# Patient Record
Sex: Male | Born: 1935 | Race: White | Hispanic: No | Marital: Married | Smoking: Former smoker
Health system: Southern US, Community
[De-identification: ages and names within clinical notes are randomized; demographics above are authoritative.]

## PROBLEM LIST (undated history)

## (undated) DIAGNOSIS — I10 Essential (primary) hypertension: Secondary | ICD-10-CM

## (undated) DIAGNOSIS — I340 Nonrheumatic mitral (valve) insufficiency: Secondary | ICD-10-CM

## (undated) DIAGNOSIS — I639 Cerebral infarction, unspecified: Secondary | ICD-10-CM

## (undated) DIAGNOSIS — I4891 Unspecified atrial fibrillation: Secondary | ICD-10-CM

## (undated) DIAGNOSIS — M1990 Unspecified osteoarthritis, unspecified site: Secondary | ICD-10-CM

## (undated) DIAGNOSIS — F319 Bipolar disorder, unspecified: Secondary | ICD-10-CM

## (undated) DIAGNOSIS — N289 Disorder of kidney and ureter, unspecified: Secondary | ICD-10-CM

## (undated) DIAGNOSIS — R251 Tremor, unspecified: Secondary | ICD-10-CM

## (undated) HISTORY — PX: SHOULDER SURGERY: SHX246

---

## 2000-05-17 ENCOUNTER — Ambulatory Visit (HOSPITAL_BASED_OUTPATIENT_CLINIC_OR_DEPARTMENT_OTHER): Admission: RE | Admit: 2000-05-17 | Discharge: 2000-05-17 | Payer: Self-pay | Admitting: General Surgery

## 2000-05-17 ENCOUNTER — Encounter (INDEPENDENT_AMBULATORY_CARE_PROVIDER_SITE_OTHER): Payer: Self-pay | Admitting: Specialist

## 2002-11-07 ENCOUNTER — Ambulatory Visit (HOSPITAL_COMMUNITY): Admission: RE | Admit: 2002-11-07 | Discharge: 2002-11-07 | Payer: Self-pay | Admitting: Gastroenterology

## 2008-12-11 ENCOUNTER — Emergency Department (HOSPITAL_COMMUNITY): Admission: EM | Admit: 2008-12-11 | Discharge: 2008-12-11 | Payer: Self-pay | Admitting: Emergency Medicine

## 2009-01-20 ENCOUNTER — Emergency Department (HOSPITAL_COMMUNITY): Admission: EM | Admit: 2009-01-20 | Discharge: 2009-01-21 | Payer: Self-pay | Admitting: Emergency Medicine

## 2009-01-20 ENCOUNTER — Emergency Department (HOSPITAL_COMMUNITY): Admission: EM | Admit: 2009-01-20 | Discharge: 2009-01-20 | Payer: Self-pay | Admitting: Emergency Medicine

## 2009-05-30 ENCOUNTER — Ambulatory Visit (HOSPITAL_BASED_OUTPATIENT_CLINIC_OR_DEPARTMENT_OTHER): Admission: RE | Admit: 2009-05-30 | Discharge: 2009-05-30 | Payer: Self-pay | Admitting: Orthopedic Surgery

## 2009-08-17 IMAGING — CT CT HEAD W/O CM
1 series · 16 of 30 positions shown, 20 images · non-contrast
Comparison: None

CLINICAL DATA: Headache and fever.

CT HEAD WITHOUT CONTRAST
TECHNIQUE: Contiguous axial images were obtained from the base of
the skull through the vertex without contrast.

[Series 2: headseq 4.8 h45s · axial · 0.43mm/px · z∈[-148,+9]mm · 16 of 36 slices shown, 20 images]
[im 2/36  brain]
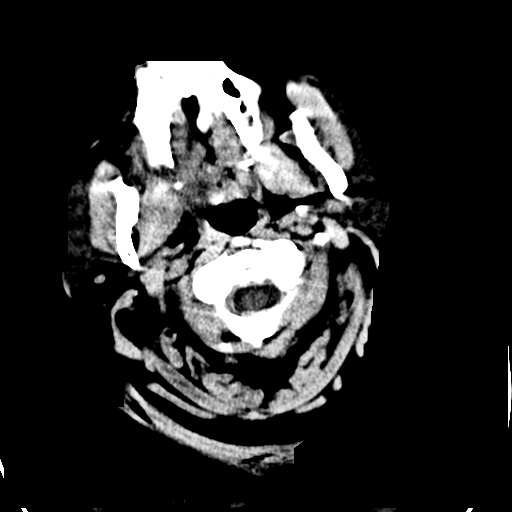
[im 2/36  bone]
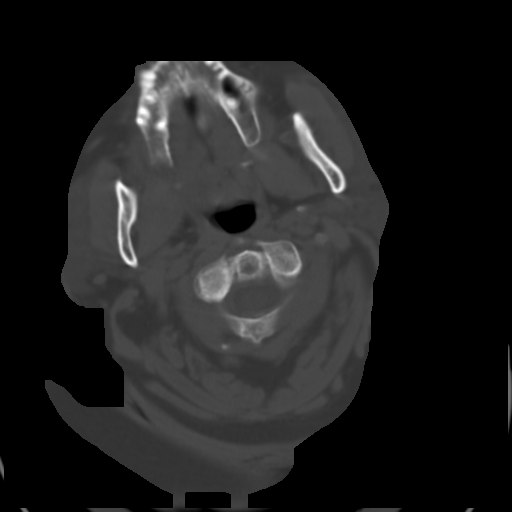
[im 4/36  brain]
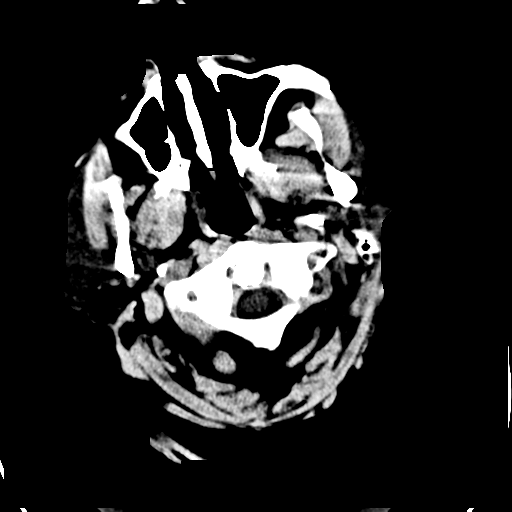
[im 7/36  brain]
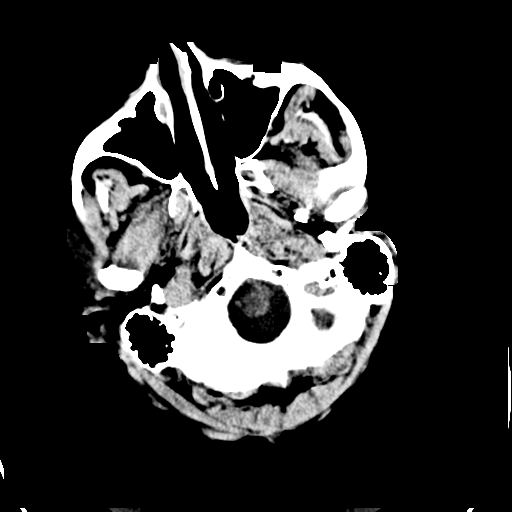
[im 9/36  brain]
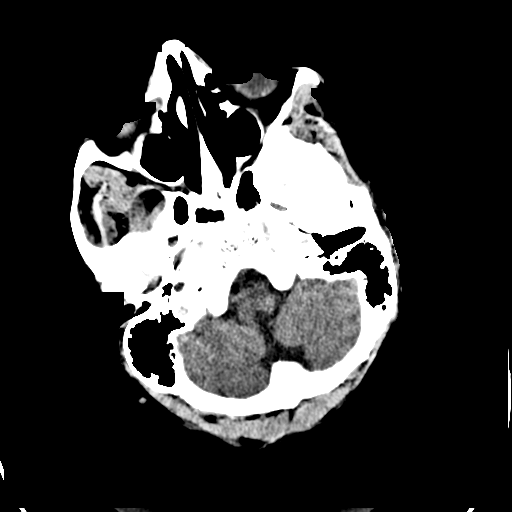
[im 10/36  brain]
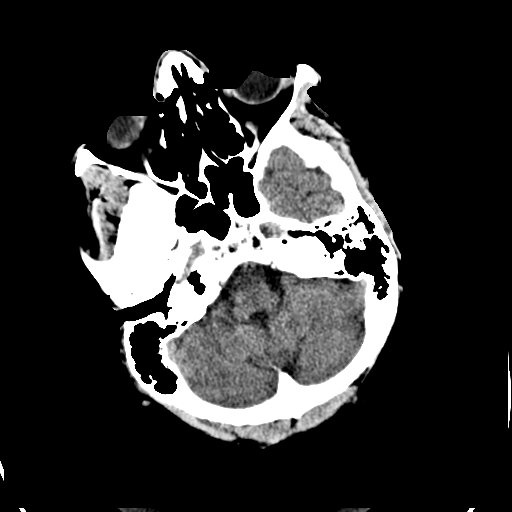
[im 10/36  bone]
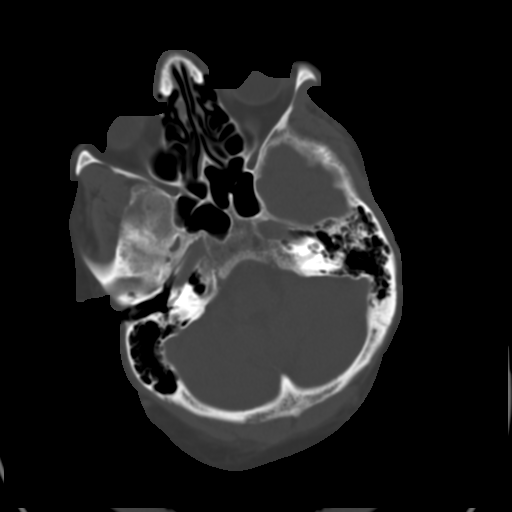
[im 13/36  brain]
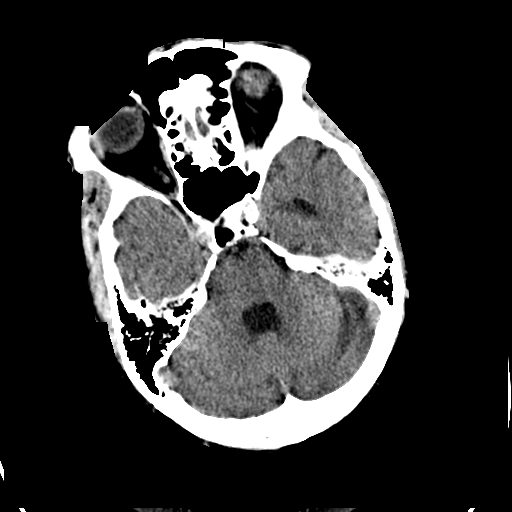
[im 15/36  brain]
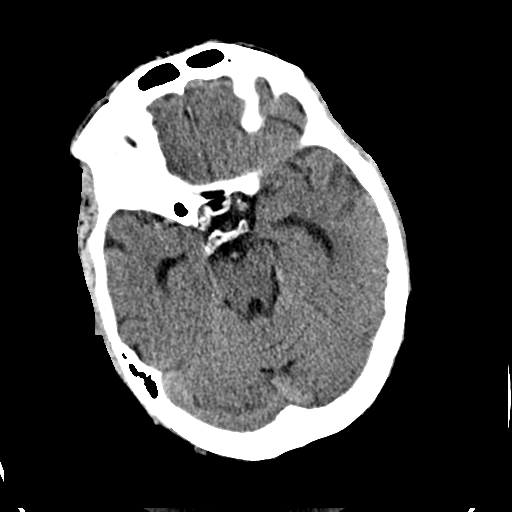
[im 17/36  brain]
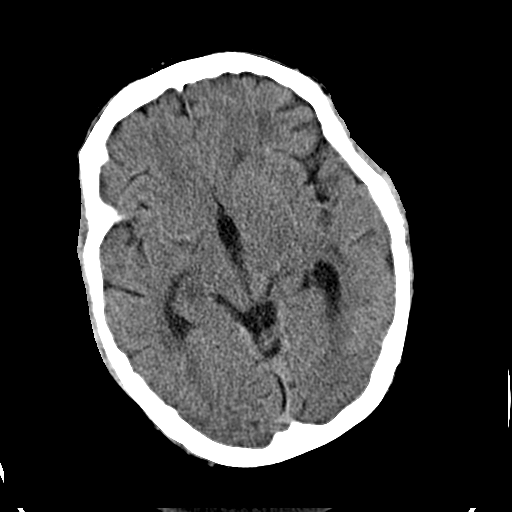
[im 19/36  brain]
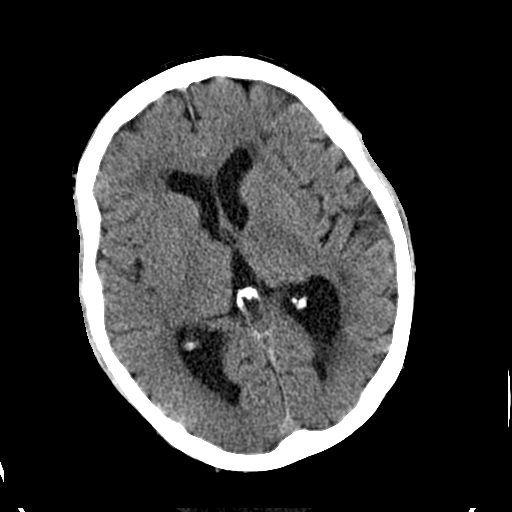
[im 19/36  bone]
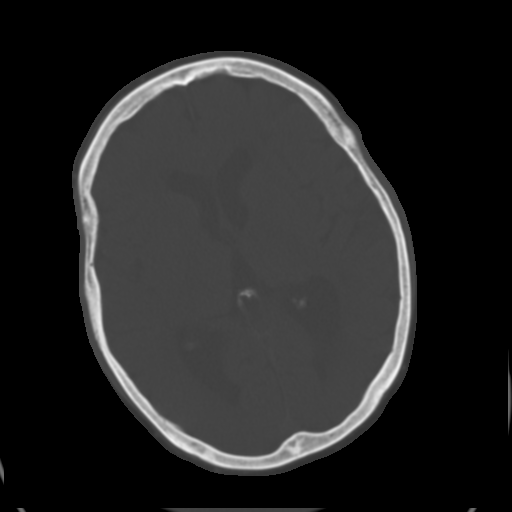
[im 21/36  brain]
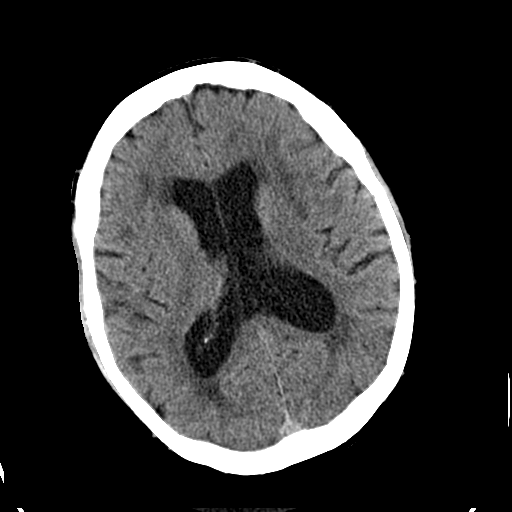
[im 23/36  brain]
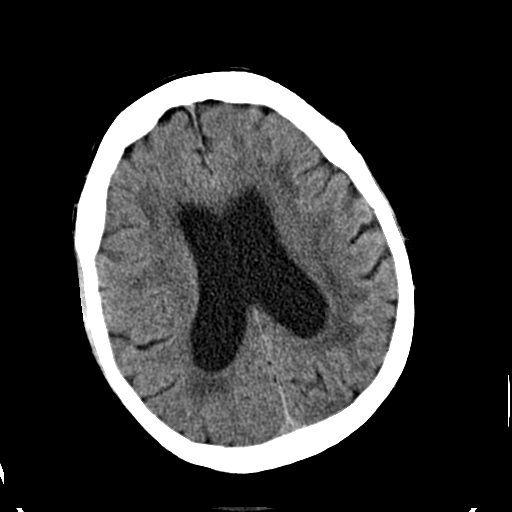
[im 26/36  brain]
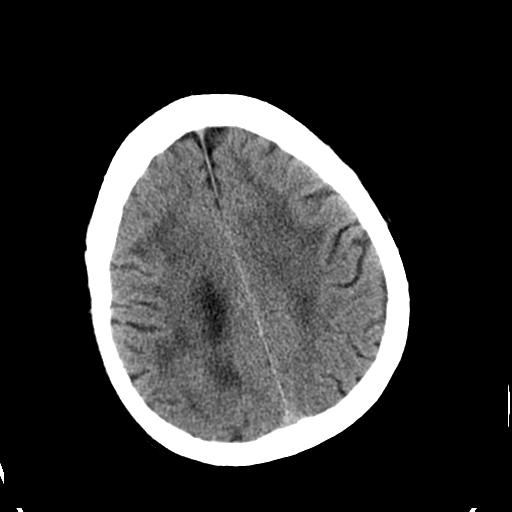
[im 27/36  brain]
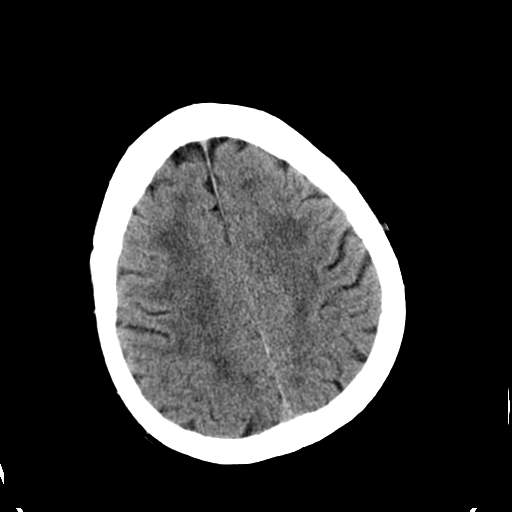
[im 27/36  bone]
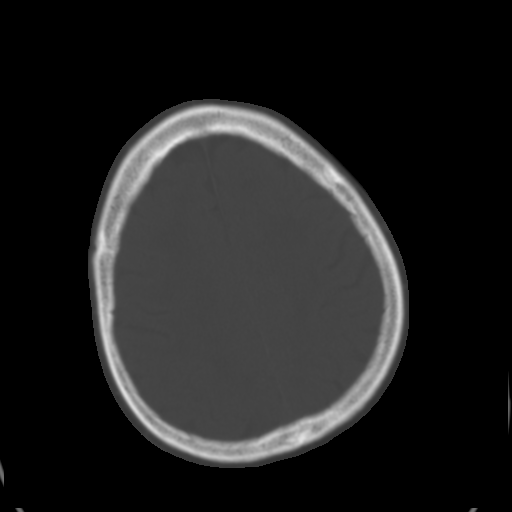
[im 29/36  brain]
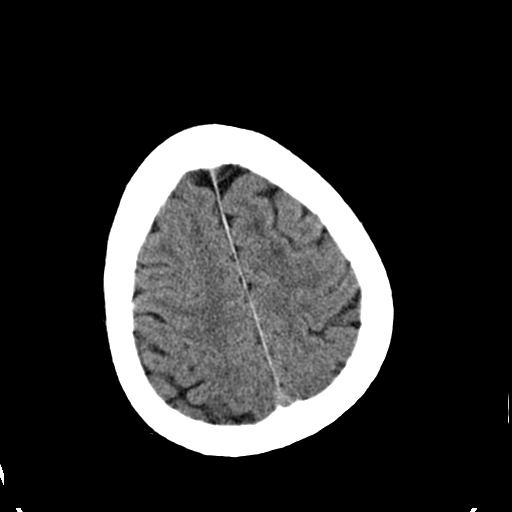
[im 32/36  brain]
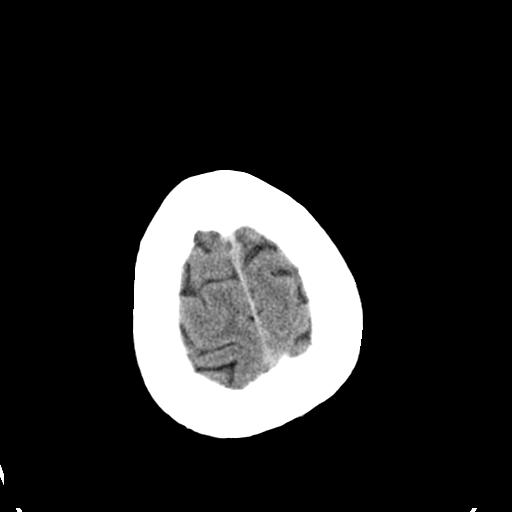
[im 34/36  brain]
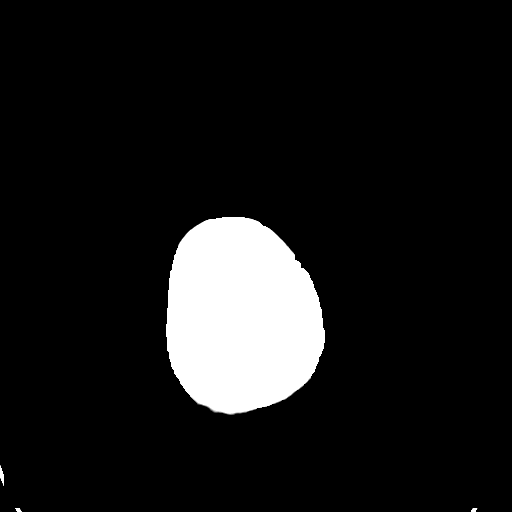

[16 of 30 positions shown; findings below may reference images not displayed]

FINDINGS: Ventricles are mildly enlarged, this may be due to
atrophy.  Patchy hypodensity throughout the white matter
bilaterally may be due to chronic microvascular ischemia.  No prior
studies for comparison.  Negative for acute infarct.  Negative for
hemorrhage or mass lesion.  The paranasal sinuses are clear.
IMPRESSION: Mild atrophy and moderate disease in the white matter bilaterally.
This is most likely due to chronic microvascular ischemia.
Negative for hemorrhage.

## 2009-08-17 IMAGING — CR DG CHEST 2V
2 series · 2 of 2 positions shown · non-contrast
Comparison: None

CLINICAL DATA: Fever.

CHEST - 2 VIEW

[w chest pa]
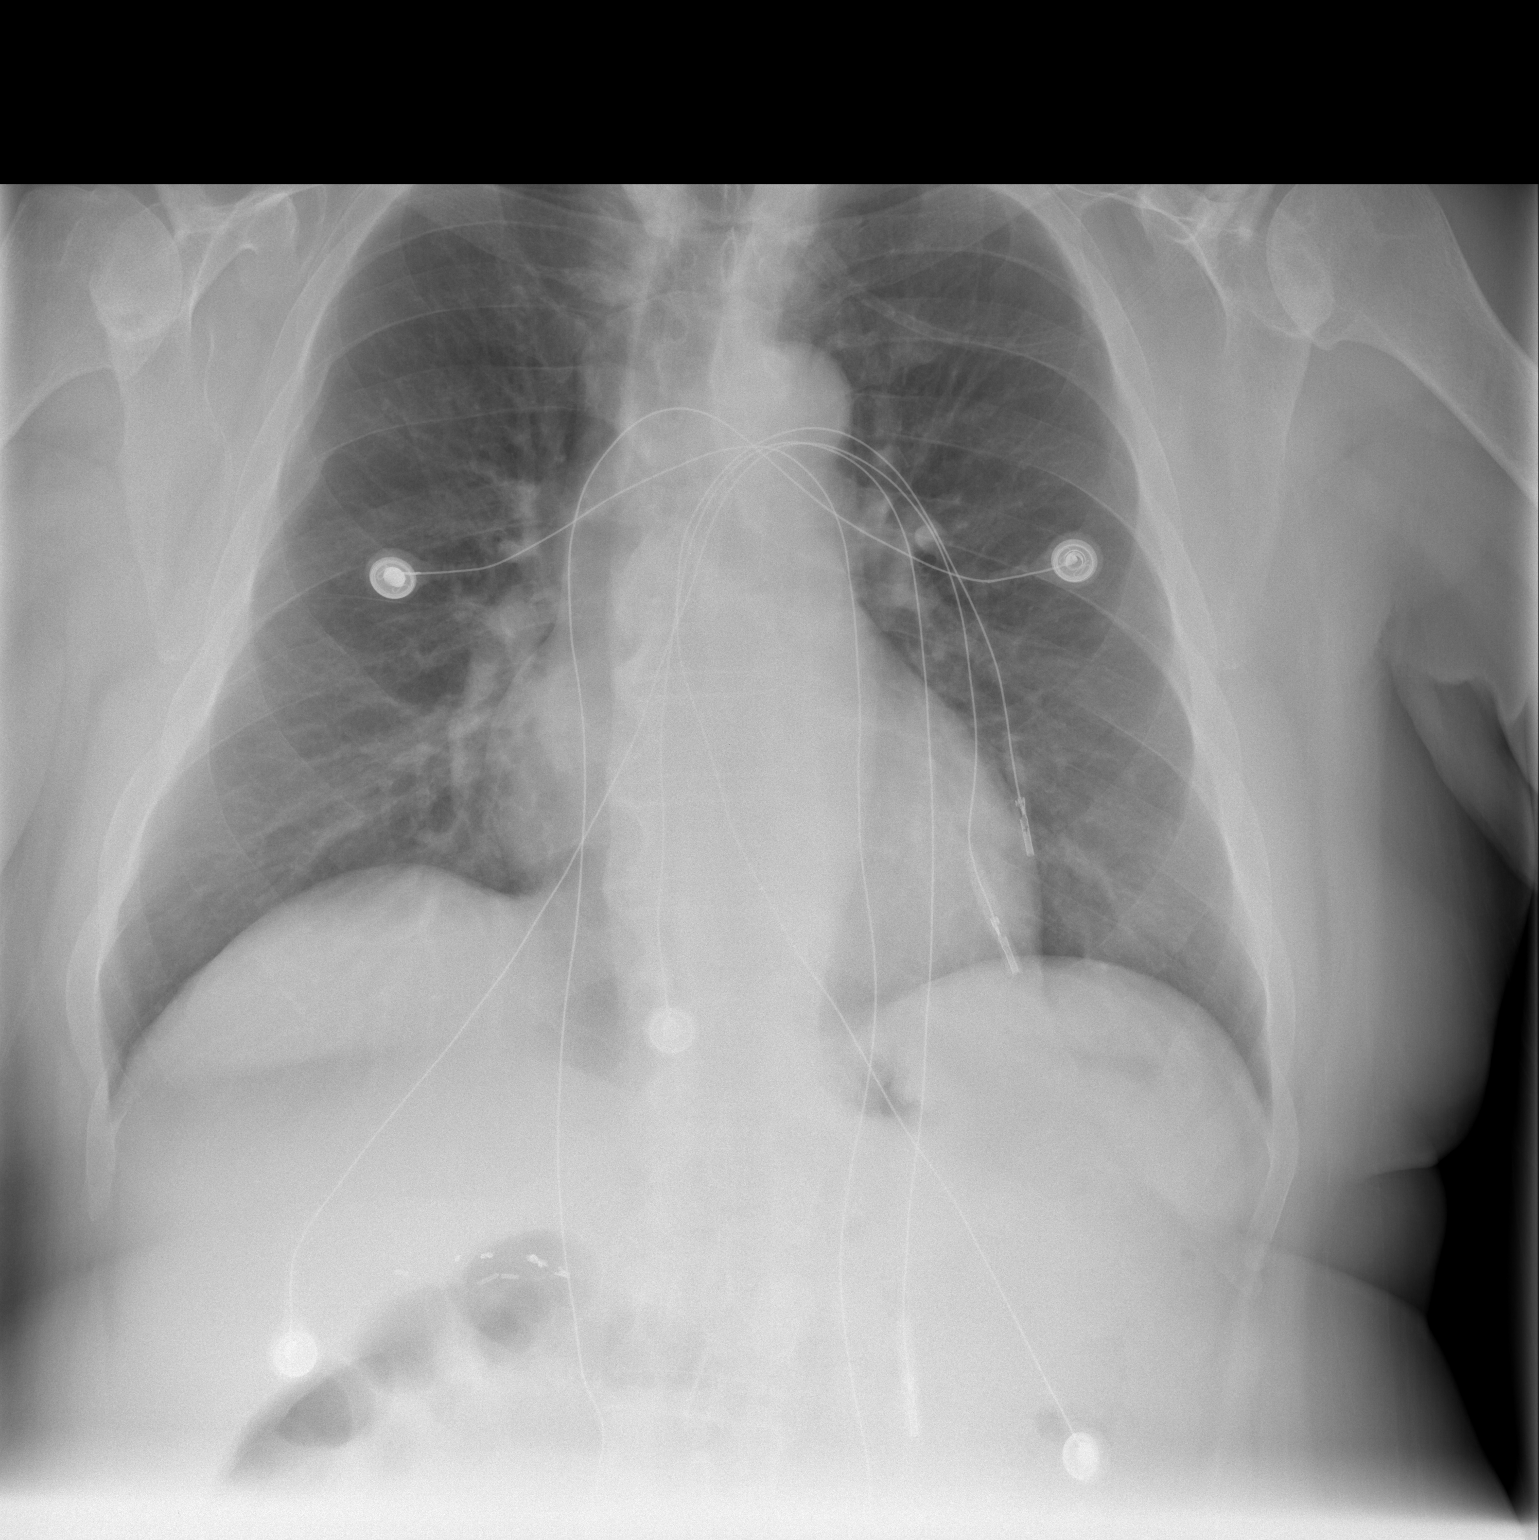

[w chest lat]
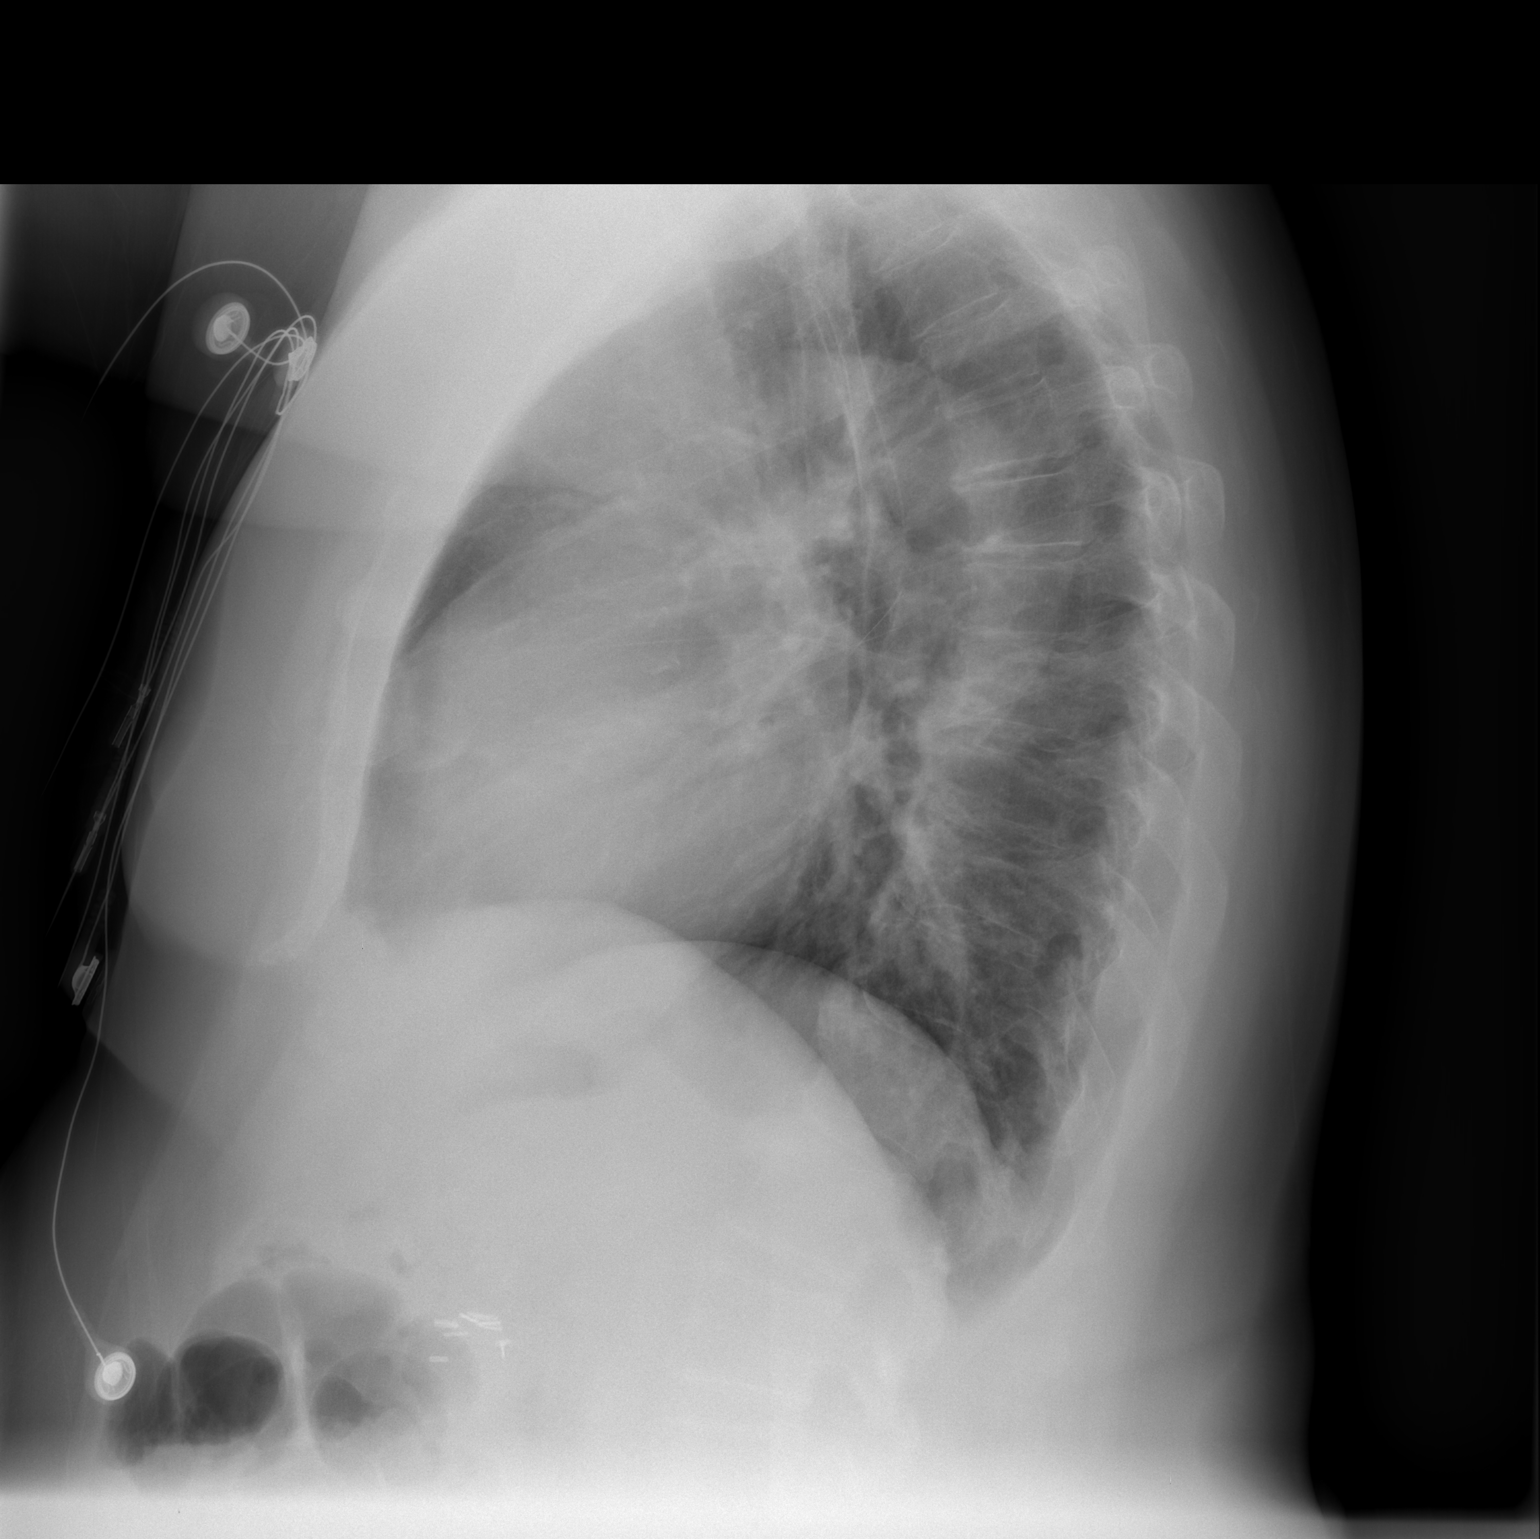

[2 of 2 positions shown; findings below may reference images not displayed]

FINDINGS: Heart size is within normal limits.  There is no heart
failure.  The lungs are clear without infiltrate or effusion.
Chronic rib fractures on the right are noted.
IMPRESSION: No acute cardiopulmonary disease.

## 2009-08-18 IMAGING — CR DG ABDOMEN 2V
2 series · 2 of 2 positions shown · non-contrast
Comparison: 12/11/2008

CLINICAL DATA: Abdominal pain, nausea, fever, headache

ABDOMEN - 2 VIEW

[w abdomen upright *]
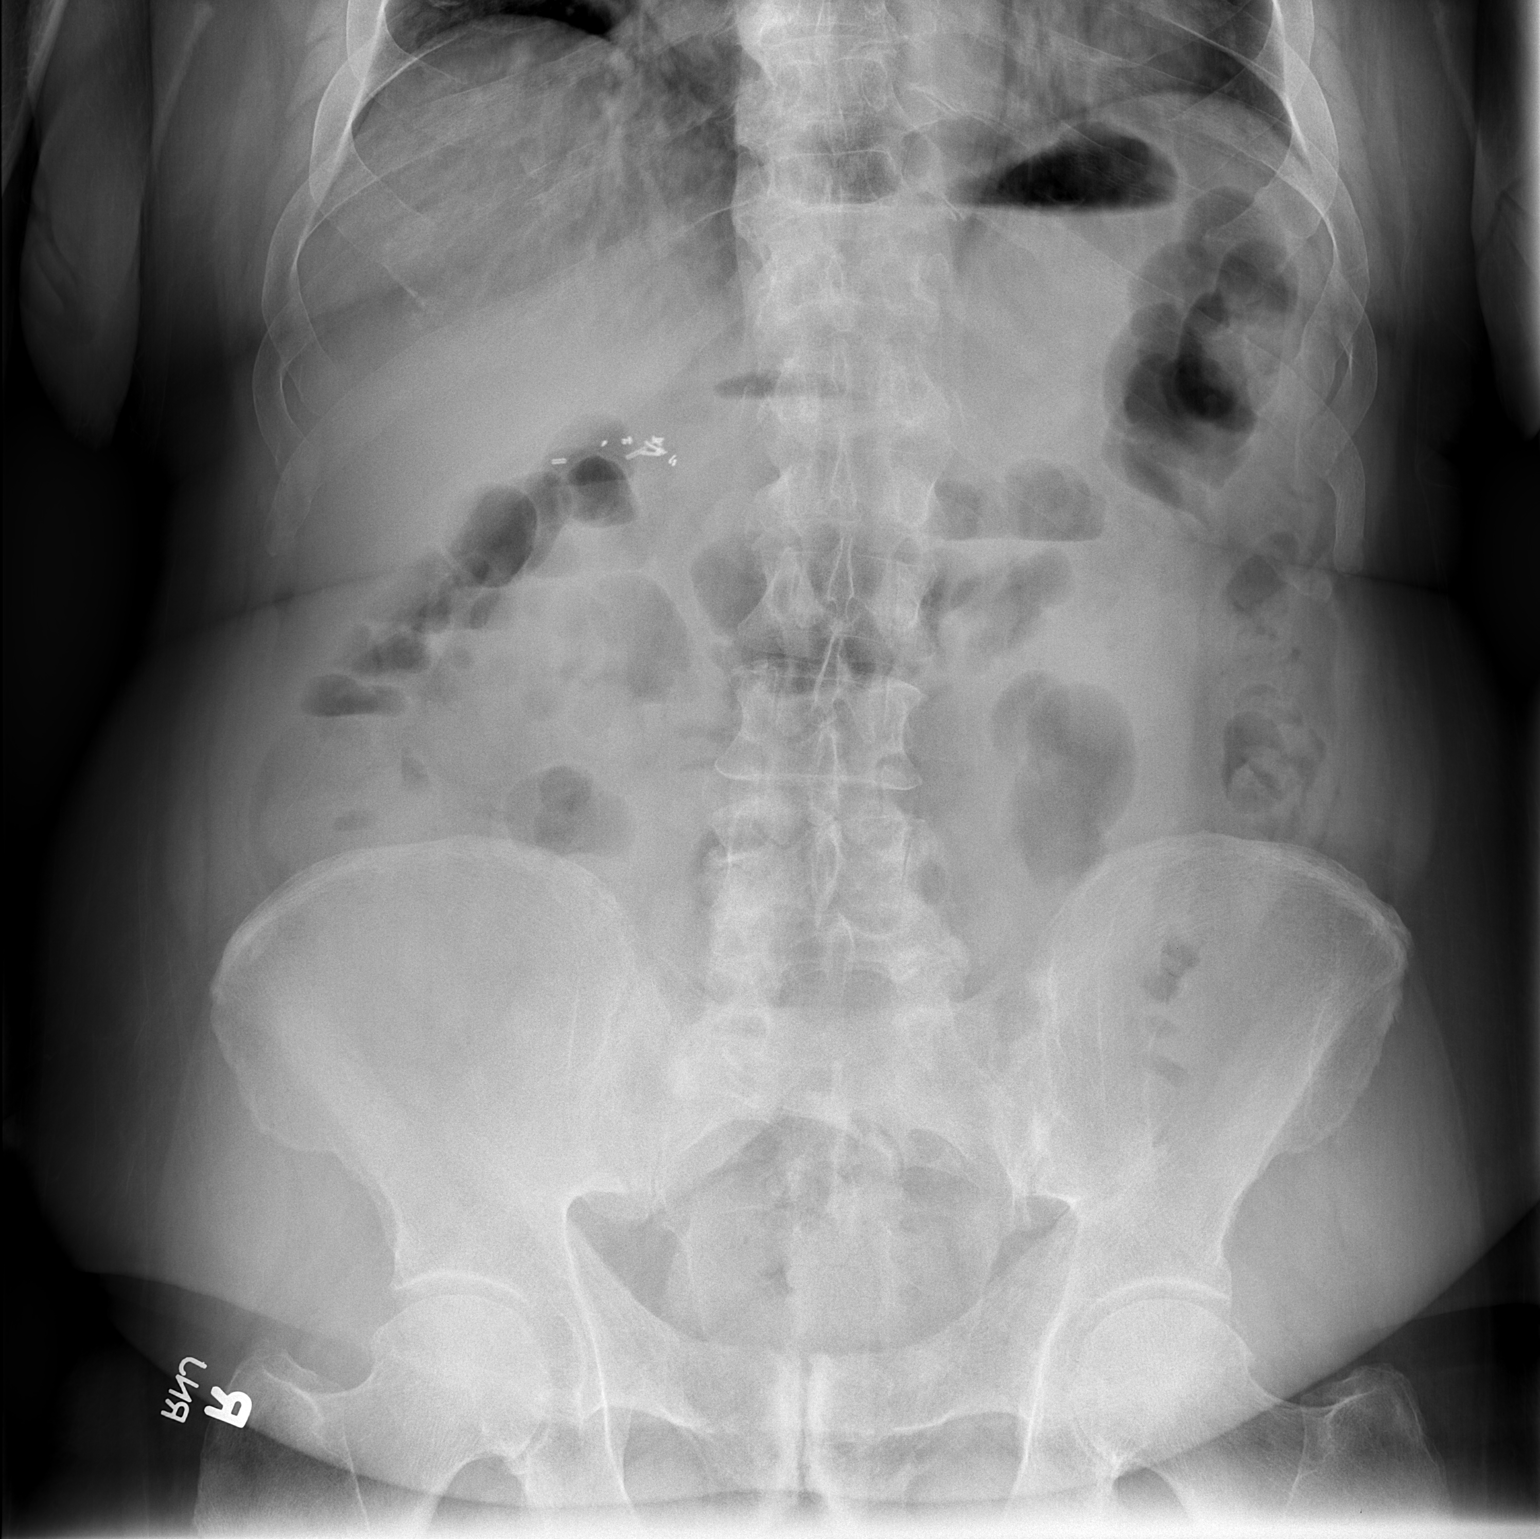

[t abdomen supine]
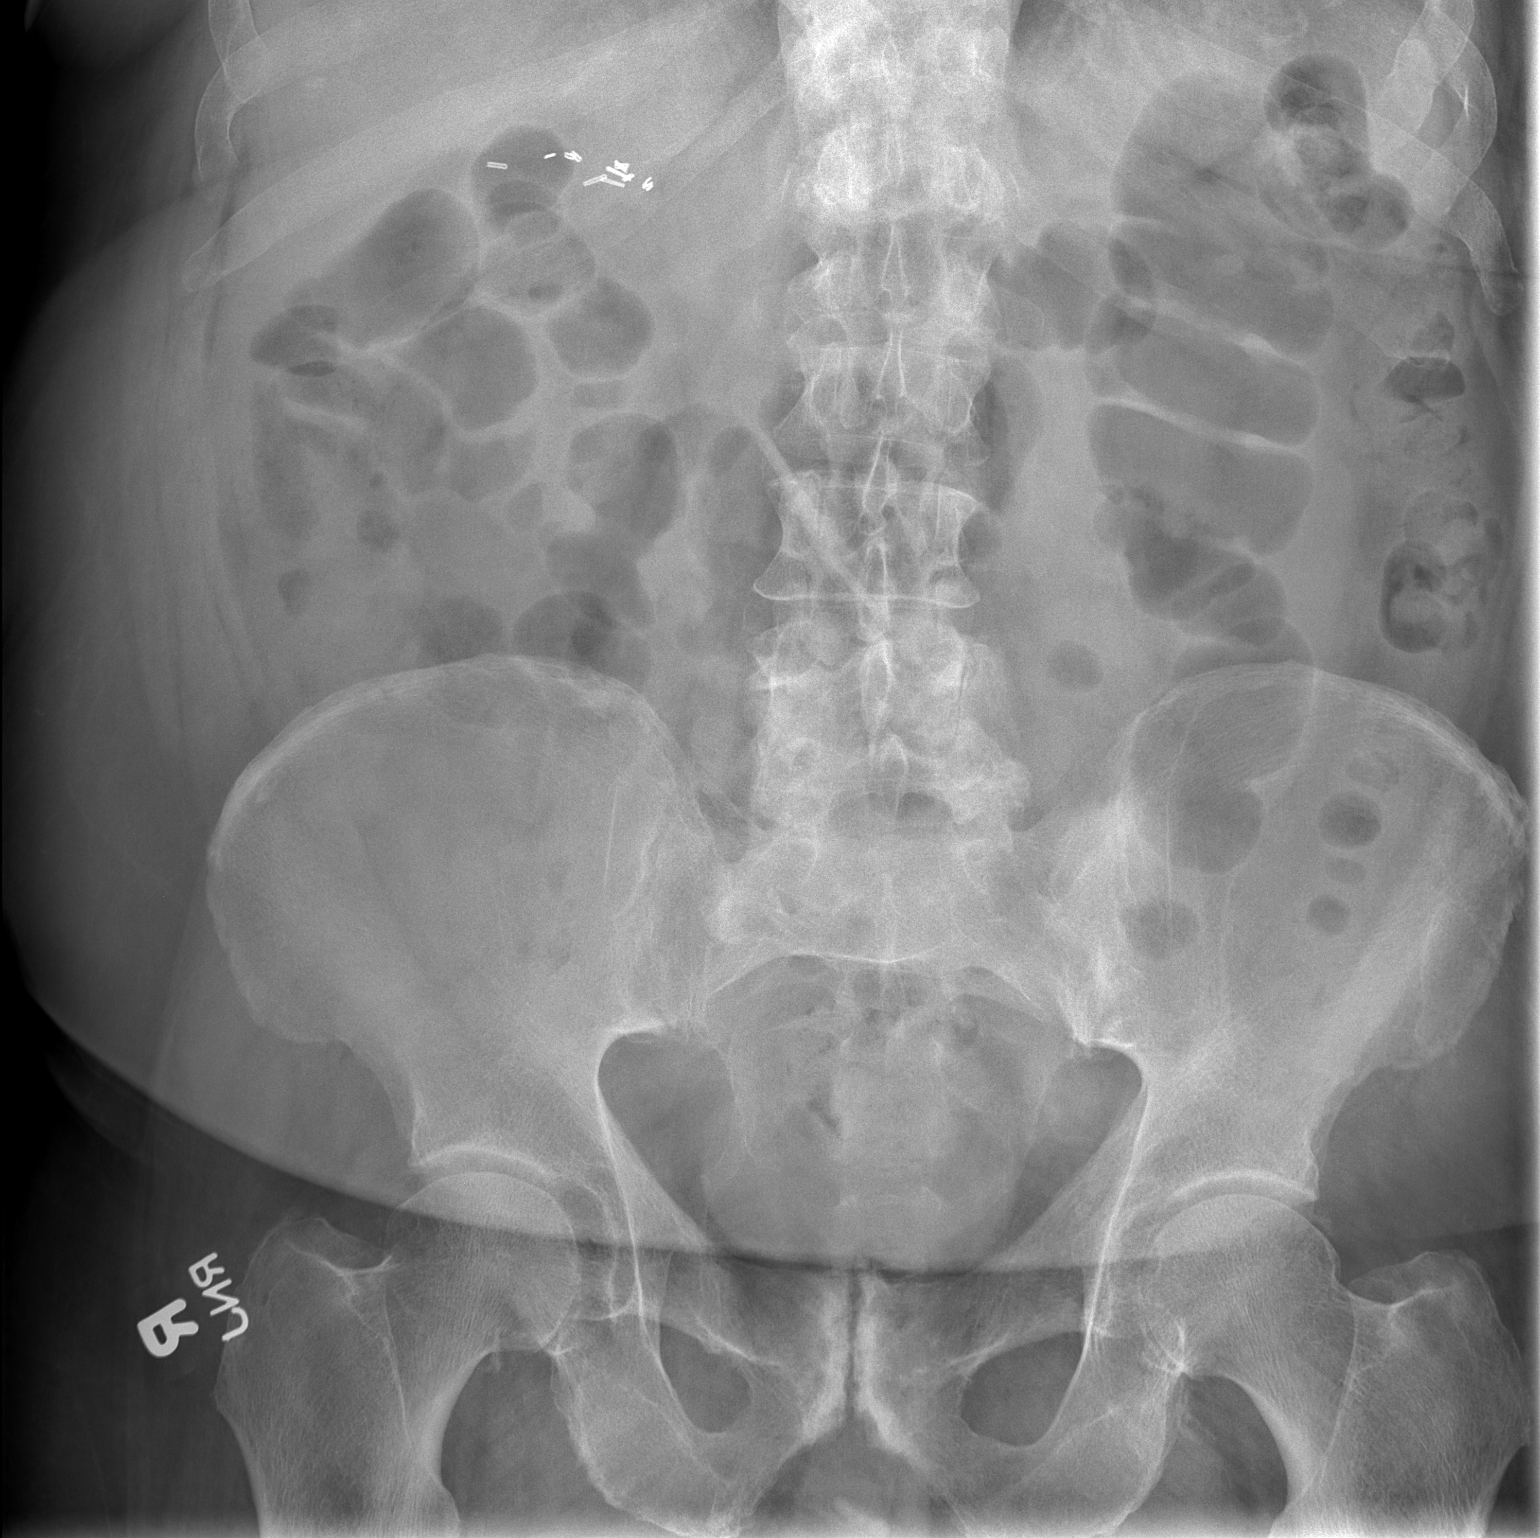

[2 of 2 positions shown; findings below may reference images not displayed]

FINDINGS: Surgical clips right upper quadrant question cholecystectomy.
Air-filled nondistended loops of large and small bowel.
No bowel dilatation, bowel wall thickening, or free air.
Osteitis pubis.
Degenerative facet disease changes lower lumbar spine.
Diffuse bony demineralization.
No urinary tract calcification.
IMPRESSION: Benign bowel gas pattern.

## 2009-08-23 ENCOUNTER — Observation Stay (HOSPITAL_COMMUNITY): Admission: EM | Admit: 2009-08-23 | Discharge: 2009-08-27 | Payer: Self-pay | Admitting: Emergency Medicine

## 2009-08-26 ENCOUNTER — Ambulatory Visit: Payer: Self-pay | Admitting: Vascular Surgery

## 2009-08-26 ENCOUNTER — Encounter (INDEPENDENT_AMBULATORY_CARE_PROVIDER_SITE_OTHER): Payer: Self-pay | Admitting: Internal Medicine

## 2009-09-07 ENCOUNTER — Encounter: Admission: RE | Admit: 2009-09-07 | Discharge: 2009-09-07 | Payer: Self-pay | Admitting: Family Medicine

## 2009-09-26 ENCOUNTER — Encounter: Admission: RE | Admit: 2009-09-26 | Discharge: 2009-09-26 | Payer: Self-pay | Admitting: Family Medicine

## 2010-09-17 LAB — BASIC METABOLIC PANEL
CO2: 23 mEq/L (ref 19–32)
Chloride: 108 mEq/L (ref 96–112)
GFR calc non Af Amer: >60 mL/min (ref >60)
Glucose, Bld: 87 mg/dL (ref 70–99)
Potassium: 5 mEq/L (ref 3.5–5.1)

## 2010-09-17 LAB — COMPREHENSIVE METABOLIC PANEL
AST: 24 U/L (ref 0–37)
CO2: 27 mEq/L (ref 19–32)
Chloride: 107 mEq/L (ref 96–112)
Potassium: 4.1 mEq/L (ref 3.5–5.1)
Total Protein: 6.8 g/dL (ref 6.0–8.3)

## 2010-09-17 LAB — CBC
HCT: 38.3 % — ABNORMAL LOW (ref 39.0–52.0)
HCT: 43.3 % (ref 39.0–52.0)
Hemoglobin: 13 g/dL (ref 13.0–17.0)
MCHC: 33.7 g/dL (ref 30.0–36.0)
MCHC: 34 g/dL (ref 30.0–36.0)
Platelets: 287 10*3/uL (ref 150–400)
Platelets: 296 10*3/uL (ref 150–400)
RBC: 4.24 MIL/uL (ref 4.22–5.81)
RDW: 12.8 % (ref 11.5–15.5)
WBC: 8.2 10*3/uL (ref 4.0–10.5)

## 2010-09-17 LAB — LITHIUM LEVEL: Lithium Lvl: 0.28 mEq/L — ABNORMAL LOW (ref 0.80–1.40)

## 2010-09-17 LAB — PROTIME-INR
Prothrombin Time: 12.8 seconds (ref 11.6–15.2)
Prothrombin Time: 13.3 seconds (ref 11.6–15.2)

## 2010-09-17 LAB — DIFFERENTIAL
Eosinophils Relative: 2 % (ref 0–5)
Lymphs Abs: 2.7 10*3/uL (ref 0.7–4.0)
Monocytes Absolute: 0.8 10*3/uL (ref 0.1–1.0)
Neutrophils Relative %: 54 % (ref 43–77)

## 2010-09-17 LAB — HEMOGLOBIN A1C
Hgb A1c MFr Bld: 5.7 % (ref 4.6–6.1)
Mean Plasma Glucose: 117 mg/dL

## 2010-09-17 LAB — CARDIAC PANEL(CRET KIN+CKTOT+MB+TROPI)
Relative Index: INVALID (ref 0.0–2.5)
Troponin I: 0.01 ng/mL (ref 0.00–0.06)

## 2010-09-17 LAB — LIPID PANEL
LDL Cholesterol: 111 mg/dL — ABNORMAL HIGH (ref 0–99)
Total CHOL/HDL Ratio: 4.8 RATIO
Triglycerides: 122 mg/dL (ref <150)
VLDL: 24 mg/dL (ref 0–40)

## 2010-09-17 LAB — APTT: aPTT: 30 seconds (ref 24–37)

## 2010-09-22 LAB — PROTIME-INR: INR: 1.05 (ref 0.00–1.49)

## 2010-09-30 LAB — POCT HEMOGLOBIN-HEMACUE: Hemoglobin: 15 g/dL (ref 13.0–17.0)

## 2010-10-01 LAB — BASIC METABOLIC PANEL
BUN: 11 mg/dL (ref 6–23)
Calcium: 9.7 mg/dL (ref 8.4–10.5)
Chloride: 106 mEq/L (ref 96–112)
Creatinine, Ser: 1.03 mg/dL (ref 0.4–1.5)
GFR calc Af Amer: >60 mL/min (ref >60)
GFR calc non Af Amer: >60 mL/min (ref >60)

## 2010-10-05 LAB — CULTURE, BLOOD (ROUTINE X 2): Culture: NO GROWTH

## 2010-10-05 LAB — DIFFERENTIAL
Basophils Relative: 0 % (ref 0–1)
Basophils Relative: 0 % (ref 0–1)
Eosinophils Absolute: 0.1 10*3/uL (ref 0.0–0.7)
Eosinophils Relative: 0 % (ref 0–5)
Monocytes Absolute: 0.4 10*3/uL (ref 0.1–1.0)
Monocytes Absolute: 0.7 10*3/uL (ref 0.1–1.0)
Monocytes Relative: 5 % (ref 3–12)
Monocytes Relative: 7 % (ref 3–12)
Neutro Abs: 8.6 10*3/uL — ABNORMAL HIGH (ref 1.7–7.7)
Neutrophils Relative %: 88 % — ABNORMAL HIGH (ref 43–77)

## 2010-10-05 LAB — COMPREHENSIVE METABOLIC PANEL
AST: 36 U/L (ref 0–37)
Albumin: 3.3 g/dL — ABNORMAL LOW (ref 3.5–5.2)
Alkaline Phosphatase: 68 U/L (ref 39–117)
BUN: 14 mg/dL (ref 6–23)
Chloride: 102 mEq/L (ref 96–112)
GFR calc Af Amer: >60 mL/min (ref >60)
Potassium: 4 mEq/L (ref 3.5–5.1)
Sodium: 134 mEq/L — ABNORMAL LOW (ref 135–145)
Total Bilirubin: 1 mg/dL (ref 0.3–1.2)
Total Protein: 7.5 g/dL (ref 6.0–8.3)

## 2010-10-05 LAB — URINALYSIS, ROUTINE W REFLEX MICROSCOPIC
Bilirubin Urine: NEGATIVE
Glucose, UA: NEGATIVE mg/dL
Glucose, UA: NEGATIVE mg/dL
Ketones, ur: NEGATIVE mg/dL
Leukocytes, UA: NEGATIVE
Leukocytes, UA: NEGATIVE
Nitrite: NEGATIVE
Nitrite: NEGATIVE
Protein, ur: 30 mg/dL — AB
Specific Gravity, Urine: 1.025 (ref 1.005–1.030)
Urobilinogen, UA: 1 mg/dL (ref 0.0–1.0)
pH: 6.5 (ref 5.0–8.0)

## 2010-10-05 LAB — POCT CARDIAC MARKERS
CKMB, poc: 3.9 ng/mL (ref 1.0–8.0)
Myoglobin, poc: >500 ng/mL (ref 12–200)

## 2010-10-05 LAB — CBC
MCHC: 33.5 g/dL (ref 30.0–36.0)
MCV: 88.1 fL (ref 78.0–100.0)
MCV: 88.3 fL (ref 78.0–100.0)
Platelets: 313 10*3/uL (ref 150–400)
RBC: 4.32 MIL/uL (ref 4.22–5.81)
RBC: 4.68 MIL/uL (ref 4.22–5.81)
WBC: 9.8 10*3/uL (ref 4.0–10.5)

## 2010-10-05 LAB — BASIC METABOLIC PANEL
CO2: 20 mEq/L (ref 19–32)
Chloride: 105 mEq/L (ref 96–112)
Creatinine, Ser: 1 mg/dL (ref 0.4–1.5)
GFR calc Af Amer: >60 mL/min (ref >60)
Glucose, Bld: 136 mg/dL — ABNORMAL HIGH (ref 70–99)

## 2010-10-05 LAB — URINE CULTURE: Colony Count: 40000

## 2010-10-05 LAB — CK: Total CK: 531 U/L — ABNORMAL HIGH (ref 7–232)

## 2010-10-05 LAB — APTT: aPTT: 43 seconds — ABNORMAL HIGH (ref 24–37)

## 2010-10-05 LAB — URINE MICROSCOPIC-ADD ON

## 2010-10-06 LAB — CBC
HCT: 40.2 % (ref 39.0–52.0)
MCV: 91.3 fL (ref 78.0–100.0)
Platelets: 329 10*3/uL (ref 150–400)
RDW: 12.9 % (ref 11.5–15.5)

## 2010-10-06 LAB — BASIC METABOLIC PANEL
BUN: 17 mg/dL (ref 6–23)
CO2: 26 mEq/L (ref 19–32)
Chloride: 108 mEq/L (ref 96–112)
Glucose, Bld: 109 mg/dL — ABNORMAL HIGH (ref 70–99)
Potassium: 4.6 mEq/L (ref 3.5–5.1)

## 2010-10-06 LAB — URINALYSIS, ROUTINE W REFLEX MICROSCOPIC
Glucose, UA: NEGATIVE mg/dL
Hgb urine dipstick: NEGATIVE
Ketones, ur: NEGATIVE mg/dL
Protein, ur: NEGATIVE mg/dL

## 2010-10-06 LAB — DIFFERENTIAL
Basophils Absolute: 0 10*3/uL (ref 0.0–0.1)
Eosinophils Absolute: 0 10*3/uL (ref 0.0–0.7)
Eosinophils Relative: 0 % (ref 0–5)

## 2010-10-06 LAB — PROTIME-INR: Prothrombin Time: 21.6 seconds — ABNORMAL HIGH (ref 11.6–15.2)

## 2010-11-14 NOTE — Op Note (Signed)
[place]. [facility]  Patient:    Sean Bishop, Sean Bishop                        MRN: [id number] Proc. Date: 05/17/00 Adm. Date:  [id number] Attending:  Brandy Hale CC:         Al Decant. Janey Greaser, M.D.   Operative Report  PREOPERATIVE DIAGNOSIS:  Dysplastic nevus, anterior chest wall.  POSTOPERATIVE DIAGNOSIS:  Dysplastic nevus, anterior chest wall.  OPERATION PERFORMED:  Re-excision of dysplastic nevus, anterior chest wall with margins.  SURGEON:  Angelia Mould. Derrell Lolling, M.D.  ANESTHESIA:  INDICATIONS FOR PROCEDURE:  The patient is a 77 year old white male who recently had a pigmented nevus excised or shaved from his anterior chest wall by Dr. Doran Clay.  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  Margins were questionable.  He was brought back to the operating room for re-excision with margins.  Exam revealed a 1.5 cm wound in the anterior chest wall with a healing eschar and a slight bit of surrounding erythema but no evidence of infection.  DESCRIPTION OF PROCEDURE:  The patient was brought to the [facility] and placed in the minor surgery room supine.  The anterior chest wall was prepped and draped in sterile fashion.  1% Xylocaine with epinephrine was used as local infiltration anesthetic.  The incision was oriented sagittally just off the midline.  An elliptical incision was created to get at least 5 mm of margin circumferentially.  The incision was made and taken down into the subcutaneous tissue and well under the nevus.  The specimen was removed and sent to pathology.  The wound that was left was approximately 4 to 5 cm in vertical dimension by about 3 cm in transverse dimension.  Hemostasis was excellent and achieved with electrocautery.  The skin was closed with interrupted simple sutures of 3-0 nylon.  This provided a very nice  closure.  Clean bandages were placed.  The patient was returned to the waiting room in excellent condition.  Estimated blood loss was about 5 cc.  Complications were none.  Needle and instrument counts were correct. DD:  05/17/00 TD:  05/17/00 Job: [id number]

## 2011-03-23 ENCOUNTER — Ambulatory Visit: Payer: Medicare Other | Attending: Orthopedic Surgery | Admitting: Physical Therapy

## 2011-03-23 DIAGNOSIS — M25676 Stiffness of unspecified foot, not elsewhere classified: Secondary | ICD-10-CM | POA: Insufficient documentation

## 2011-03-23 DIAGNOSIS — M25579 Pain in unspecified ankle and joints of unspecified foot: Secondary | ICD-10-CM | POA: Insufficient documentation

## 2011-03-23 DIAGNOSIS — M25673 Stiffness of unspecified ankle, not elsewhere classified: Secondary | ICD-10-CM | POA: Insufficient documentation

## 2011-03-23 DIAGNOSIS — IMO0001 Reserved for inherently not codable concepts without codable children: Secondary | ICD-10-CM | POA: Insufficient documentation

## 2011-03-24 ENCOUNTER — Ambulatory Visit: Payer: Medicare Other | Admitting: Physical Therapy

## 2011-03-25 ENCOUNTER — Ambulatory Visit: Payer: Medicare Other | Admitting: Physical Therapy

## 2011-03-30 ENCOUNTER — Ambulatory Visit: Payer: Medicare Other | Attending: Orthopedic Surgery | Admitting: Physical Therapy

## 2011-03-30 DIAGNOSIS — M25579 Pain in unspecified ankle and joints of unspecified foot: Secondary | ICD-10-CM | POA: Insufficient documentation

## 2011-03-30 DIAGNOSIS — M25676 Stiffness of unspecified foot, not elsewhere classified: Secondary | ICD-10-CM | POA: Insufficient documentation

## 2011-03-30 DIAGNOSIS — IMO0001 Reserved for inherently not codable concepts without codable children: Secondary | ICD-10-CM | POA: Insufficient documentation

## 2011-03-30 DIAGNOSIS — M25673 Stiffness of unspecified ankle, not elsewhere classified: Secondary | ICD-10-CM | POA: Insufficient documentation

## 2011-04-01 ENCOUNTER — Ambulatory Visit: Payer: Medicare Other | Admitting: Physical Therapy

## 2011-04-02 ENCOUNTER — Ambulatory Visit: Payer: Medicare Other | Admitting: Physical Therapy

## 2011-04-07 ENCOUNTER — Ambulatory Visit: Payer: Medicare Other | Admitting: Physical Therapy

## 2011-04-08 ENCOUNTER — Ambulatory Visit: Payer: Medicare Other | Admitting: Physical Therapy

## 2011-04-10 ENCOUNTER — Ambulatory Visit: Payer: Medicare Other | Admitting: Physical Therapy

## 2011-04-14 ENCOUNTER — Ambulatory Visit: Payer: Medicare Other | Admitting: Physical Therapy

## 2011-04-16 ENCOUNTER — Ambulatory Visit: Payer: Medicare Other | Admitting: Physical Therapy

## 2011-04-17 ENCOUNTER — Ambulatory Visit: Payer: Medicare Other | Admitting: Physical Therapy

## 2011-05-12 ENCOUNTER — Other Ambulatory Visit: Payer: Self-pay | Admitting: Orthopedic Surgery

## 2011-06-09 ENCOUNTER — Encounter (HOSPITAL_COMMUNITY): Admission: RE | Payer: Self-pay | Source: Ambulatory Visit

## 2011-06-09 ENCOUNTER — Inpatient Hospital Stay (HOSPITAL_COMMUNITY): Admission: RE | Admit: 2011-06-09 | Payer: Medicare Other | Source: Ambulatory Visit | Admitting: Orthopedic Surgery

## 2011-06-09 SURGERY — Surgical Case
Anesthesia: *Unknown

## 2011-06-09 SURGERY — HEMIARTHROPLASTY, SHOULDER
Anesthesia: General | Laterality: Left

## 2011-06-25 ENCOUNTER — Ambulatory Visit: Payer: Medicare Other | Attending: Orthopedic Surgery | Admitting: Physical Therapy

## 2011-06-25 DIAGNOSIS — IMO0001 Reserved for inherently not codable concepts without codable children: Secondary | ICD-10-CM | POA: Insufficient documentation

## 2011-06-25 DIAGNOSIS — M25676 Stiffness of unspecified foot, not elsewhere classified: Secondary | ICD-10-CM | POA: Insufficient documentation

## 2011-06-25 DIAGNOSIS — M25673 Stiffness of unspecified ankle, not elsewhere classified: Secondary | ICD-10-CM | POA: Insufficient documentation

## 2011-06-25 DIAGNOSIS — M25579 Pain in unspecified ankle and joints of unspecified foot: Secondary | ICD-10-CM | POA: Insufficient documentation

## 2011-06-29 ENCOUNTER — Ambulatory Visit: Payer: Medicare Other

## 2011-07-01 ENCOUNTER — Ambulatory Visit: Payer: Medicare Other | Attending: Orthopedic Surgery

## 2011-07-01 DIAGNOSIS — M25676 Stiffness of unspecified foot, not elsewhere classified: Secondary | ICD-10-CM | POA: Insufficient documentation

## 2011-07-01 DIAGNOSIS — M25579 Pain in unspecified ankle and joints of unspecified foot: Secondary | ICD-10-CM | POA: Insufficient documentation

## 2011-07-01 DIAGNOSIS — IMO0001 Reserved for inherently not codable concepts without codable children: Secondary | ICD-10-CM | POA: Insufficient documentation

## 2011-07-01 DIAGNOSIS — M25673 Stiffness of unspecified ankle, not elsewhere classified: Secondary | ICD-10-CM | POA: Insufficient documentation

## 2011-07-02 ENCOUNTER — Ambulatory Visit: Payer: Medicare Other | Admitting: Physical Therapy

## 2011-07-06 ENCOUNTER — Encounter: Payer: Medicare Other | Admitting: Physical Therapy

## 2011-07-08 ENCOUNTER — Ambulatory Visit: Payer: Medicare Other

## 2011-07-09 ENCOUNTER — Ambulatory Visit: Payer: Medicare Other | Admitting: Physical Therapy

## 2013-09-06 ENCOUNTER — Emergency Department (HOSPITAL_COMMUNITY)
Admission: EM | Admit: 2013-09-06 | Discharge: 2013-09-06 | Disposition: A | Discharge status: Home / Self Care | Payer: Medicare Other | Attending: Emergency Medicine | Admitting: Emergency Medicine

## 2013-09-06 ENCOUNTER — Encounter (HOSPITAL_COMMUNITY): Payer: Self-pay | Admitting: Emergency Medicine

## 2013-09-06 ENCOUNTER — Emergency Department (HOSPITAL_COMMUNITY): Payer: Medicare Other

## 2013-09-06 DIAGNOSIS — Z8739 Personal history of other diseases of the musculoskeletal system and connective tissue: Secondary | ICD-10-CM | POA: Insufficient documentation

## 2013-09-06 DIAGNOSIS — Z79899 Other long term (current) drug therapy: Secondary | ICD-10-CM | POA: Insufficient documentation

## 2013-09-06 DIAGNOSIS — I4891 Unspecified atrial fibrillation: Secondary | ICD-10-CM | POA: Insufficient documentation

## 2013-09-06 DIAGNOSIS — Z7901 Long term (current) use of anticoagulants: Secondary | ICD-10-CM | POA: Insufficient documentation

## 2013-09-06 DIAGNOSIS — Z87891 Personal history of nicotine dependence: Secondary | ICD-10-CM | POA: Insufficient documentation

## 2013-09-06 DIAGNOSIS — F319 Bipolar disorder, unspecified: Secondary | ICD-10-CM | POA: Insufficient documentation

## 2013-09-06 DIAGNOSIS — I10 Essential (primary) hypertension: Secondary | ICD-10-CM | POA: Insufficient documentation

## 2013-09-06 DIAGNOSIS — Z8673 Personal history of transient ischemic attack (TIA), and cerebral infarction without residual deficits: Secondary | ICD-10-CM | POA: Insufficient documentation

## 2013-09-06 DIAGNOSIS — R079 Chest pain, unspecified: Secondary | ICD-10-CM | POA: Insufficient documentation

## 2013-09-06 HISTORY — DX: Cerebral infarction, unspecified: I63.9

## 2013-09-06 HISTORY — DX: Unspecified osteoarthritis, unspecified site: M19.90

## 2013-09-06 HISTORY — DX: Essential (primary) hypertension: I10

## 2013-09-06 HISTORY — DX: Bipolar disorder, unspecified: F31.9

## 2013-09-06 HISTORY — DX: Unspecified atrial fibrillation: I48.91

## 2013-09-06 LAB — PROTIME-INR
INR: 2.58 — AB (ref 0.00–1.49)
PROTHROMBIN TIME: 26.8 s — AB (ref 11.6–15.2)

## 2013-09-06 LAB — I-STAT TROPONIN, ED: TROPONIN I, POC: 0 ng/mL (ref 0.00–0.08)

## 2013-09-06 NOTE — ED Provider Notes (Addendum)
CSN: [id number]     Arrival date & time 09/06/13  1258 History   [id number] MD Initiated Contact with Patient 09/06/13 1312     Chief Complaint  Patient presents with  . Chest Pain     (Consider location/radiation/quality/duration/timing/severity/associated sxs/prior Treatment) Patient is a 80 y.o. male presenting with chest pain. The history is provided by the patient.  Chest Pain  He was at his doctor's office today, when he suddenly developed right upper chest cramping pain. It lasted about 25 minutes then resolved spontaneously. There is no associated nausea, vomiting, diaphoresis, shortness of breath, weakness, or dizziness. He has had this numerous times in the left chest. He follows with his cardiologist regularly. He has not had any other illnesses. He was at his doctor's office today for an INR check. He is taking his medicines as prescribed. There are no other known modifying factors  Past Medical History  Diagnosis Date  . Atrial fibrillation   . Hypertension   . Stroke   . DJD (degenerative joint disease)   . Bipolar 1 disorder    Past Surgical History  Procedure Laterality Date  . Shoulder surgery     History reviewed. No pertinent family history. History  Substance Use Topics  . Smoking status: Former Games developermoker  . Smokeless tobacco: Never Used  . Alcohol Use: 0.6 oz/week    1 Glasses of wine per week     Comment: 1 per month    Review of Systems  Cardiovascular: Positive for chest pain.  All other systems reviewed and are negative.      Allergies  Lipitor  Home Medications   Current Outpatient Rx  Name  Route  Sig  Dispense  Refill  . acetaminophen (TYLENOL) 500 MG tablet   Oral   Take 1,500 mg by mouth 2 (two) times daily.         . Ascorbic Acid (VITAMIN C PO)   Oral   Take 1 tablet by mouth daily.         Marland Kitchen. CALCIUM-VITAMIN D PO   Oral   Take 1 tablet by mouth daily.         . Cholecalciferol (VITAMIN D PO)   Oral   Take 1 tablet by  mouth daily.         . dorzolamide-timolol (COSOPT) 22.3-6.8 MG/ML ophthalmic solution   Both Eyes   Place 1 drop into both eyes every morning.         . latanoprost (XALATAN) 0.005 % ophthalmic solution   Both Eyes   Place 1 drop into both eyes at bedtime.         Marland Kitchen. lithium carbonate (LITHOBID) 300 MG CR tablet   Oral   Take 300 mg by mouth 2 (two) times daily.         . Multiple Vitamin (MULTIVITAMIN WITH MINERALS) TABS tablet   Oral   Take 1 tablet by mouth daily.         Marland Kitchen. warfarin (COUMADIN) 5 MG tablet   Oral   Take 2.5-5 mg by mouth daily. Take 2.5 Mon and Thurs, take 1 all other days          BP 148/87  Pulse 76  Temp(Src) 97.5 F (36.4 C) (Oral)  Resp 14  SpO2 100% Physical Exam  Nursing note and vitals reviewed. Constitutional: He is oriented to person, place, and time. He appears well-developed and well-nourished. No distress.  HENT:  Head: Normocephalic and atraumatic.  Right Ear: External  ear normal.  Left Ear: External ear normal.  Eyes: Conjunctivae and EOM are normal. Pupils are equal, round, and reactive to light.  Neck: Normal range of motion and phonation normal. Neck supple.  Cardiovascular: Normal rate, regular rhythm, normal heart sounds and intact distal pulses.   Pulmonary/Chest: Effort normal and breath sounds normal. No respiratory distress. He has no wheezes. He exhibits no tenderness and no bony tenderness.  Abdominal: Soft. Normal appearance. There is no tenderness.  Musculoskeletal: Normal range of motion. He exhibits no edema and no tenderness.  Neurological: He is alert and oriented to person, place, and time. No cranial nerve deficit or sensory deficit. He exhibits normal muscle tone. Coordination normal.  Skin: Skin is warm, dry and intact.  Psychiatric: He has a normal mood and affect. His behavior is normal. Judgment and thought content normal.    ED Course  Procedures (including critical care time)  Medications - No  data to display  Patient Vitals for the past 24 hrs:  BP Temp Temp src Pulse Resp SpO2  09/06/13 1312 148/87 mmHg 97.5 F (36.4 C) Oral 76 14 100 %  09/06/13 1254 143/94 mmHg 97.2 F (36.2 C) Oral 81 20 96 %    3:17 PM Reevaluation with update and discussion. After initial assessment and treatment, an updated evaluation reveals no recurrence of the chest discomfort. Janda Cargo L    Labs Review Labs Reviewed  PROTIME-INR - Abnormal; Notable for the following:    Prothrombin Time 26.8 (*)    INR 2.58 (*)    All other components within normal limits  I-STAT TROPOININ, ED   Imaging Review Dg Chest 2 View  09/06/2013   CLINICAL DATA Chest pain  EXAM CHEST  2 VIEW  COMPARISON 08/23/2009  FINDINGS Heart size is upper normal. Negative for heart failure. Mild right lower lobe atelectasis. No definite pneumonia or effusion.  Mildly distended bowel loops suggestive of ileus. Correlate with findings.  Chronic right rib fractures unchanged.  IMPRESSION Mild right lower lobe atelectasis.  Question ileus  SIGNATURE  Electronically Signed   By: Marlan Palau M.D.   On: 09/06/2013 14:46     EKG Interpretation   Date/Time:  Wednesday September 06 2013 13:03:37 EDT Ventricular Rate:  81 PR Interval:    QRS Duration: 97 QT Interval:  362 QTC Calculation: 420 R Axis:   25 Text Interpretation:  Atrial fibrillation vs. Atrial Flutter with 3:1  block Prior tracing was NSR Confirmed by Kathaleya Mcduffee  MD, Glorious Flicker ([id number]) on  09/06/2013 1:06:54 PM      MDM   Final diagnoses:  Nonspecific chest pain    Nonspecific chest pain. Doubt ACS, PE, pneumonia, about instability, or serious bacterial infection. His INR is therapeutic and does not need to be rechecked   Nursing Notes Reviewed/ Care Coordinated Applicable Imaging Reviewed Interpretation of Laboratory Data incorporated into ED treatment  The patient appears reasonably screened and/or stabilized for discharge and I doubt any other medical  condition or other [facility] requiring further screening, evaluation, or treatment in the ED at this time prior to discharge.  Plan: Home Medications- usual; Home Treatments- rest; return here if the recommended treatment, does not improve the symptoms; Recommended follow up- PCP, when necessary     Flint Melter, MD 09/06/13 1518  Flint Melter, MD 09/06/13 1520

## 2013-09-06 NOTE — ED Notes (Signed)
MD at bedside. 

## 2013-09-06 NOTE — ED Notes (Signed)
[facility] presents with a 80 yo male from physician's office with right sided CP.  Pt is currently in atrial fib; With [facility] pt was in and out of afib and a-flutter;  Pt states this is the first time he has had right sided CP; pt has hx of atrial fib and left sided CP.  Pt was in doctor's office waiting on appointment when episode occurred and was described as a horrible cramping of the right side of chest for approximately 20 minutes and then pain resolved.  Pt is on Warfarin.  VS stable at this time.

## 2013-09-06 NOTE — Discharge Instructions (Signed)
Chest Pain (Nonspecific) °It is often hard to give a specific diagnosis for the cause of chest pain. There is always a chance that your pain could be related to something serious, such as a heart attack or a blood clot in the lungs. You need to follow up with your caregiver for further evaluation. °CAUSES  °· Heartburn. °· Pneumonia or bronchitis. °· Anxiety or stress. °· Inflammation around your heart (pericarditis) or lung (pleuritis or pleurisy). °· A blood clot in the lung. °· A collapsed lung (pneumothorax). It can develop suddenly on its own (spontaneous pneumothorax) or from injury (trauma) to the chest. °· Shingles infection (herpes zoster virus). °The chest wall is composed of bones, muscles, and cartilage. Any of these can be the source of the pain. °· The bones can be bruised by injury. °· The muscles or cartilage can be strained by coughing or overwork. °· The cartilage can be affected by inflammation and become sore (costochondritis). °DIAGNOSIS  °Lab tests or other studies, such as X-rays, electrocardiography, stress testing, or cardiac imaging, may be needed to find the cause of your pain.  °TREATMENT  °· Treatment depends on what may be causing your chest pain. Treatment may include: °· Acid blockers for heartburn. °· Anti-inflammatory medicine. °· Pain medicine for inflammatory conditions. °· Antibiotics if an infection is present. °· You may be advised to change lifestyle habits. This includes stopping smoking and avoiding alcohol, caffeine, and chocolate. °· You may be advised to keep your head raised (elevated) when sleeping. This reduces the chance of acid going backward from your stomach into your esophagus. °· Most of the time, nonspecific chest pain will improve within 2 to 3 days with rest and mild pain medicine. °HOME CARE INSTRUCTIONS  °· If antibiotics were prescribed, take your antibiotics as directed. Finish them even if you start to feel better. °· For the next few days, avoid physical  activities that bring on chest pain. Continue physical activities as directed. °· Do not smoke. °· Avoid drinking alcohol. °· Only take over-the-counter or prescription medicine for pain, discomfort, or fever as directed by your caregiver. °· Follow your caregiver's suggestions for further testing if your chest pain does not go away. °· Keep any follow-up appointments you made. If you do not go to an appointment, you could develop lasting (chronic) problems with pain. If there is any problem keeping an appointment, you must call to reschedule. °SEEK MEDICAL CARE IF:  °· You think you are having problems from the medicine you are taking. Read your medicine instructions carefully. °· Your chest pain does not go away, even after treatment. °· You develop a rash with blisters on your chest. °SEEK IMMEDIATE MEDICAL CARE IF:  °· You have increased chest pain or pain that spreads to your arm, neck, jaw, back, or abdomen. °· You develop shortness of breath, an increasing cough, or you are coughing up blood. °· You have severe back or abdominal pain, feel nauseous, or vomit. °· You develop severe weakness, fainting, or chills. °· You have a fever. °THIS IS AN EMERGENCY. Do not wait to see if the pain will go away. Get medical help at once. Call your local emergency services (911 in [place] Do not drive yourself to the hospital. °MAKE SURE YOU:  °· Understand these instructions. °· Will watch your condition. °· Will get help right away if you are not doing well or get worse. °Document Released: 03/25/2005 Document Revised: 09/07/2011 Document Reviewed: 01/19/2008 °[employer]® Patient Information ©2014 [employer]   [employer]. ° °

## 2014-04-03 ENCOUNTER — Other Ambulatory Visit: Payer: Self-pay | Admitting: Urology

## 2014-04-03 DIAGNOSIS — M8580 Other specified disorders of bone density and structure, unspecified site: Secondary | ICD-10-CM

## 2014-04-18 ENCOUNTER — Ambulatory Visit
Admission: RE | Admit: 2014-04-18 | Discharge: 2014-04-18 | Disposition: A | Discharge status: Home / Self Care | Payer: Medicare Other | Source: Ambulatory Visit | Attending: Urology | Admitting: Urology

## 2014-04-18 DIAGNOSIS — M8580 Other specified disorders of bone density and structure, unspecified site: Secondary | ICD-10-CM

## 2014-05-08 ENCOUNTER — Ambulatory Visit: Payer: Medicare Other | Attending: Family Medicine | Admitting: Physical Therapy

## 2014-05-08 DIAGNOSIS — R2689 Other abnormalities of gait and mobility: Secondary | ICD-10-CM | POA: Insufficient documentation

## 2014-05-08 DIAGNOSIS — Z9889 Other specified postprocedural states: Secondary | ICD-10-CM | POA: Diagnosis not present

## 2014-05-08 DIAGNOSIS — M8580 Other specified disorders of bone density and structure, unspecified site: Secondary | ICD-10-CM | POA: Insufficient documentation

## 2014-05-08 DIAGNOSIS — Z8673 Personal history of transient ischemic attack (TIA), and cerebral infarction without residual deficits: Secondary | ICD-10-CM | POA: Diagnosis not present

## 2014-05-08 DIAGNOSIS — Z5189 Encounter for other specified aftercare: Secondary | ICD-10-CM | POA: Insufficient documentation

## 2014-05-08 DIAGNOSIS — F319 Bipolar disorder, unspecified: Secondary | ICD-10-CM | POA: Diagnosis not present

## 2014-05-08 DIAGNOSIS — I4891 Unspecified atrial fibrillation: Secondary | ICD-10-CM | POA: Insufficient documentation

## 2014-05-08 DIAGNOSIS — I10 Essential (primary) hypertension: Secondary | ICD-10-CM | POA: Diagnosis not present

## 2014-05-08 DIAGNOSIS — M6281 Muscle weakness (generalized): Secondary | ICD-10-CM | POA: Insufficient documentation

## 2014-05-15 ENCOUNTER — Ambulatory Visit: Payer: Medicare Other | Admitting: Physical Therapy

## 2014-05-15 DIAGNOSIS — Z5189 Encounter for other specified aftercare: Secondary | ICD-10-CM | POA: Diagnosis not present

## 2014-05-17 ENCOUNTER — Ambulatory Visit: Payer: Medicare Other | Admitting: Physical Therapy

## 2014-05-22 ENCOUNTER — Ambulatory Visit: Payer: Medicare Other | Admitting: Physical Therapy

## 2014-05-22 DIAGNOSIS — Z5189 Encounter for other specified aftercare: Secondary | ICD-10-CM | POA: Diagnosis not present

## 2014-05-29 ENCOUNTER — Ambulatory Visit: Payer: Medicare Other | Attending: Family Medicine

## 2014-05-29 DIAGNOSIS — Z5189 Encounter for other specified aftercare: Secondary | ICD-10-CM | POA: Insufficient documentation

## 2014-05-29 DIAGNOSIS — F319 Bipolar disorder, unspecified: Secondary | ICD-10-CM | POA: Insufficient documentation

## 2014-05-29 DIAGNOSIS — R2689 Other abnormalities of gait and mobility: Secondary | ICD-10-CM | POA: Insufficient documentation

## 2014-05-29 DIAGNOSIS — Z9889 Other specified postprocedural states: Secondary | ICD-10-CM | POA: Diagnosis not present

## 2014-05-29 DIAGNOSIS — I10 Essential (primary) hypertension: Secondary | ICD-10-CM | POA: Diagnosis not present

## 2014-05-29 DIAGNOSIS — M6281 Muscle weakness (generalized): Secondary | ICD-10-CM | POA: Diagnosis not present

## 2014-05-29 DIAGNOSIS — Z8673 Personal history of transient ischemic attack (TIA), and cerebral infarction without residual deficits: Secondary | ICD-10-CM | POA: Insufficient documentation

## 2014-05-29 DIAGNOSIS — I4891 Unspecified atrial fibrillation: Secondary | ICD-10-CM | POA: Insufficient documentation

## 2014-05-29 DIAGNOSIS — M8580 Other specified disorders of bone density and structure, unspecified site: Secondary | ICD-10-CM | POA: Insufficient documentation

## 2014-05-31 ENCOUNTER — Ambulatory Visit: Payer: Medicare Other | Admitting: Physical Therapy

## 2014-05-31 DIAGNOSIS — Z5189 Encounter for other specified aftercare: Secondary | ICD-10-CM | POA: Diagnosis not present

## 2014-06-05 ENCOUNTER — Ambulatory Visit: Payer: Medicare Other | Admitting: Physical Therapy

## 2014-06-05 DIAGNOSIS — Z5189 Encounter for other specified aftercare: Secondary | ICD-10-CM | POA: Diagnosis not present

## 2014-06-07 ENCOUNTER — Ambulatory Visit: Payer: Medicare Other | Admitting: Physical Therapy

## 2014-06-07 DIAGNOSIS — Z5189 Encounter for other specified aftercare: Secondary | ICD-10-CM | POA: Diagnosis not present

## 2014-06-12 ENCOUNTER — Ambulatory Visit: Payer: Medicare Other | Admitting: Physical Therapy

## 2014-06-12 DIAGNOSIS — Z5189 Encounter for other specified aftercare: Secondary | ICD-10-CM | POA: Diagnosis not present

## 2014-06-18 ENCOUNTER — Ambulatory Visit: Payer: Medicare Other

## 2014-06-18 DIAGNOSIS — Z5189 Encounter for other specified aftercare: Secondary | ICD-10-CM | POA: Diagnosis not present

## 2014-06-25 ENCOUNTER — Ambulatory Visit: Payer: Medicare Other | Admitting: Physical Therapy

## 2014-06-27 ENCOUNTER — Ambulatory Visit: Payer: Medicare Other

## 2014-07-02 ENCOUNTER — Ambulatory Visit: Payer: Medicare Other | Attending: Family Medicine | Admitting: Physical Therapy

## 2014-07-02 DIAGNOSIS — M6281 Muscle weakness (generalized): Secondary | ICD-10-CM | POA: Diagnosis not present

## 2014-07-02 DIAGNOSIS — Z8673 Personal history of transient ischemic attack (TIA), and cerebral infarction without residual deficits: Secondary | ICD-10-CM | POA: Insufficient documentation

## 2014-07-02 DIAGNOSIS — I4891 Unspecified atrial fibrillation: Secondary | ICD-10-CM | POA: Diagnosis not present

## 2014-07-02 DIAGNOSIS — R2689 Other abnormalities of gait and mobility: Secondary | ICD-10-CM | POA: Diagnosis not present

## 2014-07-02 DIAGNOSIS — I10 Essential (primary) hypertension: Secondary | ICD-10-CM | POA: Diagnosis not present

## 2014-07-02 DIAGNOSIS — Z5189 Encounter for other specified aftercare: Secondary | ICD-10-CM | POA: Insufficient documentation

## 2014-07-02 DIAGNOSIS — F319 Bipolar disorder, unspecified: Secondary | ICD-10-CM | POA: Diagnosis not present

## 2014-07-02 DIAGNOSIS — M8580 Other specified disorders of bone density and structure, unspecified site: Secondary | ICD-10-CM | POA: Diagnosis not present

## 2014-07-02 DIAGNOSIS — Z9889 Other specified postprocedural states: Secondary | ICD-10-CM | POA: Diagnosis not present

## 2014-07-04 ENCOUNTER — Ambulatory Visit: Payer: Medicare Other | Admitting: Physical Therapy

## 2014-07-04 DIAGNOSIS — Z5189 Encounter for other specified aftercare: Secondary | ICD-10-CM | POA: Diagnosis not present

## 2014-07-09 ENCOUNTER — Encounter: Payer: Medicare Other | Admitting: Physical Therapy

## 2014-07-11 ENCOUNTER — Encounter: Payer: Medicare Other | Admitting: Physical Therapy

## 2015-08-02 DIAGNOSIS — Z8673 Personal history of transient ischemic attack (TIA), and cerebral infarction without residual deficits: Secondary | ICD-10-CM | POA: Diagnosis not present

## 2015-08-02 DIAGNOSIS — E668 Other obesity: Secondary | ICD-10-CM | POA: Diagnosis not present

## 2015-08-02 DIAGNOSIS — Z7901 Long term (current) use of anticoagulants: Secondary | ICD-10-CM | POA: Diagnosis not present

## 2015-08-02 DIAGNOSIS — I482 Chronic atrial fibrillation: Secondary | ICD-10-CM | POA: Diagnosis not present

## 2015-08-14 DIAGNOSIS — E291 Testicular hypofunction: Secondary | ICD-10-CM | POA: Diagnosis not present

## 2015-08-27 DIAGNOSIS — Z7901 Long term (current) use of anticoagulants: Secondary | ICD-10-CM | POA: Diagnosis not present

## 2015-09-04 DIAGNOSIS — E291 Testicular hypofunction: Secondary | ICD-10-CM | POA: Diagnosis not present

## 2015-09-06 DIAGNOSIS — I739 Peripheral vascular disease, unspecified: Secondary | ICD-10-CM | POA: Diagnosis not present

## 2015-09-06 DIAGNOSIS — B079 Viral wart, unspecified: Secondary | ICD-10-CM | POA: Diagnosis not present

## 2015-09-06 DIAGNOSIS — L603 Nail dystrophy: Secondary | ICD-10-CM | POA: Diagnosis not present

## 2015-09-06 DIAGNOSIS — M79675 Pain in left toe(s): Secondary | ICD-10-CM | POA: Diagnosis not present

## 2015-09-16 DIAGNOSIS — M4316 Spondylolisthesis, lumbar region: Secondary | ICD-10-CM | POA: Diagnosis not present

## 2015-09-16 DIAGNOSIS — G8929 Other chronic pain: Secondary | ICD-10-CM | POA: Diagnosis not present

## 2015-09-16 DIAGNOSIS — M5441 Lumbago with sciatica, right side: Secondary | ICD-10-CM | POA: Diagnosis not present

## 2015-09-16 DIAGNOSIS — M5136 Other intervertebral disc degeneration, lumbar region: Secondary | ICD-10-CM | POA: Diagnosis not present

## 2015-09-25 DIAGNOSIS — Z7901 Long term (current) use of anticoagulants: Secondary | ICD-10-CM | POA: Diagnosis not present

## 2015-09-25 DIAGNOSIS — E291 Testicular hypofunction: Secondary | ICD-10-CM | POA: Diagnosis not present

## 2015-10-02 DIAGNOSIS — H401131 Primary open-angle glaucoma, bilateral, mild stage: Secondary | ICD-10-CM | POA: Diagnosis not present

## 2015-10-03 DIAGNOSIS — Z7901 Long term (current) use of anticoagulants: Secondary | ICD-10-CM | POA: Diagnosis not present

## 2015-10-07 DIAGNOSIS — I890 Lymphedema, not elsewhere classified: Secondary | ICD-10-CM | POA: Diagnosis not present

## 2015-10-16 DIAGNOSIS — E291 Testicular hypofunction: Secondary | ICD-10-CM | POA: Diagnosis not present

## 2015-10-21 DIAGNOSIS — J309 Allergic rhinitis, unspecified: Secondary | ICD-10-CM | POA: Diagnosis not present

## 2015-10-29 DIAGNOSIS — I482 Chronic atrial fibrillation: Secondary | ICD-10-CM | POA: Diagnosis not present

## 2015-10-29 DIAGNOSIS — Z79899 Other long term (current) drug therapy: Secondary | ICD-10-CM | POA: Diagnosis not present

## 2015-10-29 DIAGNOSIS — F319 Bipolar disorder, unspecified: Secondary | ICD-10-CM | POA: Diagnosis not present

## 2015-10-29 DIAGNOSIS — E7800 Pure hypercholesterolemia, unspecified: Secondary | ICD-10-CM | POA: Diagnosis not present

## 2015-10-29 DIAGNOSIS — Z7901 Long term (current) use of anticoagulants: Secondary | ICD-10-CM | POA: Diagnosis not present

## 2015-10-29 DIAGNOSIS — Z8673 Personal history of transient ischemic attack (TIA), and cerebral infarction without residual deficits: Secondary | ICD-10-CM | POA: Diagnosis not present

## 2015-11-06 DIAGNOSIS — E291 Testicular hypofunction: Secondary | ICD-10-CM | POA: Diagnosis not present

## 2015-11-21 DIAGNOSIS — L853 Xerosis cutis: Secondary | ICD-10-CM | POA: Diagnosis not present

## 2015-11-21 DIAGNOSIS — Z7901 Long term (current) use of anticoagulants: Secondary | ICD-10-CM | POA: Diagnosis not present

## 2015-11-21 DIAGNOSIS — R609 Edema, unspecified: Secondary | ICD-10-CM | POA: Diagnosis not present

## 2015-11-27 DIAGNOSIS — L84 Corns and callosities: Secondary | ICD-10-CM | POA: Diagnosis not present

## 2015-11-27 DIAGNOSIS — I739 Peripheral vascular disease, unspecified: Secondary | ICD-10-CM | POA: Diagnosis not present

## 2015-11-27 DIAGNOSIS — L603 Nail dystrophy: Secondary | ICD-10-CM | POA: Diagnosis not present

## 2015-11-29 DIAGNOSIS — E291 Testicular hypofunction: Secondary | ICD-10-CM | POA: Diagnosis not present

## 2015-12-19 DIAGNOSIS — Z7901 Long term (current) use of anticoagulants: Secondary | ICD-10-CM | POA: Diagnosis not present

## 2015-12-20 DIAGNOSIS — F319 Bipolar disorder, unspecified: Secondary | ICD-10-CM | POA: Diagnosis not present

## 2015-12-26 DIAGNOSIS — Z7901 Long term (current) use of anticoagulants: Secondary | ICD-10-CM | POA: Diagnosis not present

## 2015-12-26 DIAGNOSIS — F319 Bipolar disorder, unspecified: Secondary | ICD-10-CM | POA: Diagnosis not present

## 2015-12-27 DIAGNOSIS — E291 Testicular hypofunction: Secondary | ICD-10-CM | POA: Diagnosis not present

## 2016-01-02 DIAGNOSIS — I482 Chronic atrial fibrillation: Secondary | ICD-10-CM | POA: Diagnosis not present

## 2016-01-02 DIAGNOSIS — Z7901 Long term (current) use of anticoagulants: Secondary | ICD-10-CM | POA: Diagnosis not present

## 2016-01-09 DIAGNOSIS — Z7901 Long term (current) use of anticoagulants: Secondary | ICD-10-CM | POA: Diagnosis not present

## 2016-01-09 DIAGNOSIS — K429 Umbilical hernia without obstruction or gangrene: Secondary | ICD-10-CM | POA: Diagnosis not present

## 2016-01-21 DIAGNOSIS — E291 Testicular hypofunction: Secondary | ICD-10-CM | POA: Diagnosis not present

## 2016-01-21 DIAGNOSIS — Z7901 Long term (current) use of anticoagulants: Secondary | ICD-10-CM | POA: Diagnosis not present

## 2016-01-23 DIAGNOSIS — K429 Umbilical hernia without obstruction or gangrene: Secondary | ICD-10-CM | POA: Diagnosis not present

## 2016-02-04 DIAGNOSIS — Z7901 Long term (current) use of anticoagulants: Secondary | ICD-10-CM | POA: Diagnosis not present

## 2016-02-04 DIAGNOSIS — I482 Chronic atrial fibrillation: Secondary | ICD-10-CM | POA: Diagnosis not present

## 2016-02-05 DIAGNOSIS — H524 Presbyopia: Secondary | ICD-10-CM | POA: Diagnosis not present

## 2016-02-05 DIAGNOSIS — Z961 Presence of intraocular lens: Secondary | ICD-10-CM | POA: Diagnosis not present

## 2016-02-05 DIAGNOSIS — H5203 Hypermetropia, bilateral: Secondary | ICD-10-CM | POA: Diagnosis not present

## 2016-02-05 DIAGNOSIS — H401131 Primary open-angle glaucoma, bilateral, mild stage: Secondary | ICD-10-CM | POA: Diagnosis not present

## 2016-02-05 DIAGNOSIS — H52223 Regular astigmatism, bilateral: Secondary | ICD-10-CM | POA: Diagnosis not present

## 2016-02-05 DIAGNOSIS — Z9849 Cataract extraction status, unspecified eye: Secondary | ICD-10-CM | POA: Diagnosis not present

## 2016-02-11 DIAGNOSIS — Z7901 Long term (current) use of anticoagulants: Secondary | ICD-10-CM | POA: Diagnosis not present

## 2016-02-21 DIAGNOSIS — L603 Nail dystrophy: Secondary | ICD-10-CM | POA: Diagnosis not present

## 2016-02-21 DIAGNOSIS — I739 Peripheral vascular disease, unspecified: Secondary | ICD-10-CM | POA: Diagnosis not present

## 2016-02-21 DIAGNOSIS — L84 Corns and callosities: Secondary | ICD-10-CM | POA: Diagnosis not present

## 2016-02-28 DIAGNOSIS — E291 Testicular hypofunction: Secondary | ICD-10-CM | POA: Diagnosis not present

## 2016-03-03 DIAGNOSIS — Z23 Encounter for immunization: Secondary | ICD-10-CM | POA: Diagnosis not present

## 2016-03-03 DIAGNOSIS — Z7901 Long term (current) use of anticoagulants: Secondary | ICD-10-CM | POA: Diagnosis not present

## 2016-03-20 DIAGNOSIS — E291 Testicular hypofunction: Secondary | ICD-10-CM | POA: Diagnosis not present

## 2016-03-23 ENCOUNTER — Other Ambulatory Visit: Payer: Self-pay | Admitting: Urology

## 2016-03-23 DIAGNOSIS — M810 Age-related osteoporosis without current pathological fracture: Secondary | ICD-10-CM

## 2016-04-10 DIAGNOSIS — E291 Testicular hypofunction: Secondary | ICD-10-CM | POA: Diagnosis not present

## 2016-04-15 DIAGNOSIS — Z7901 Long term (current) use of anticoagulants: Secondary | ICD-10-CM | POA: Diagnosis not present

## 2016-04-20 ENCOUNTER — Ambulatory Visit
Admission: RE | Admit: 2016-04-20 | Discharge: 2016-04-20 | Disposition: A | Discharge status: Home / Self Care | Payer: Medicare Other | Source: Ambulatory Visit | Attending: Urology | Admitting: Urology

## 2016-04-20 DIAGNOSIS — M810 Age-related osteoporosis without current pathological fracture: Secondary | ICD-10-CM

## 2016-05-01 DIAGNOSIS — E291 Testicular hypofunction: Secondary | ICD-10-CM | POA: Diagnosis not present

## 2016-05-01 DIAGNOSIS — H04123 Dry eye syndrome of bilateral lacrimal glands: Secondary | ICD-10-CM | POA: Diagnosis not present

## 2016-05-01 DIAGNOSIS — H01002 Unspecified blepharitis right lower eyelid: Secondary | ICD-10-CM | POA: Diagnosis not present

## 2016-05-01 DIAGNOSIS — H16143 Punctate keratitis, bilateral: Secondary | ICD-10-CM | POA: Diagnosis not present

## 2016-05-01 DIAGNOSIS — H01005 Unspecified blepharitis left lower eyelid: Secondary | ICD-10-CM | POA: Diagnosis not present

## 2016-05-13 DIAGNOSIS — E291 Testicular hypofunction: Secondary | ICD-10-CM | POA: Diagnosis not present

## 2016-05-13 DIAGNOSIS — R972 Elevated prostate specific antigen [PSA]: Secondary | ICD-10-CM | POA: Diagnosis not present

## 2016-05-13 DIAGNOSIS — N400 Enlarged prostate without lower urinary tract symptoms: Secondary | ICD-10-CM | POA: Diagnosis not present

## 2016-05-13 DIAGNOSIS — M810 Age-related osteoporosis without current pathological fracture: Secondary | ICD-10-CM | POA: Diagnosis not present

## 2016-05-15 DIAGNOSIS — I739 Peripheral vascular disease, unspecified: Secondary | ICD-10-CM | POA: Diagnosis not present

## 2016-05-15 DIAGNOSIS — L84 Corns and callosities: Secondary | ICD-10-CM | POA: Diagnosis not present

## 2016-05-15 DIAGNOSIS — L603 Nail dystrophy: Secondary | ICD-10-CM | POA: Diagnosis not present

## 2016-05-18 DIAGNOSIS — Z7901 Long term (current) use of anticoagulants: Secondary | ICD-10-CM | POA: Diagnosis not present

## 2016-05-18 DIAGNOSIS — I482 Chronic atrial fibrillation: Secondary | ICD-10-CM | POA: Diagnosis not present

## 2016-05-18 DIAGNOSIS — Z8673 Personal history of transient ischemic attack (TIA), and cerebral infarction without residual deficits: Secondary | ICD-10-CM | POA: Diagnosis not present

## 2016-05-18 DIAGNOSIS — Z1389 Encounter for screening for other disorder: Secondary | ICD-10-CM | POA: Diagnosis not present

## 2016-05-18 DIAGNOSIS — Z79899 Other long term (current) drug therapy: Secondary | ICD-10-CM | POA: Diagnosis not present

## 2016-05-18 DIAGNOSIS — Z0000 Encounter for general adult medical examination without abnormal findings: Secondary | ICD-10-CM | POA: Diagnosis not present

## 2016-05-18 DIAGNOSIS — Z23 Encounter for immunization: Secondary | ICD-10-CM | POA: Diagnosis not present

## 2016-05-18 DIAGNOSIS — R609 Edema, unspecified: Secondary | ICD-10-CM | POA: Diagnosis not present

## 2016-05-18 DIAGNOSIS — E7800 Pure hypercholesterolemia, unspecified: Secondary | ICD-10-CM | POA: Diagnosis not present

## 2016-05-18 DIAGNOSIS — F319 Bipolar disorder, unspecified: Secondary | ICD-10-CM | POA: Diagnosis not present

## 2016-05-26 DIAGNOSIS — E291 Testicular hypofunction: Secondary | ICD-10-CM | POA: Diagnosis not present

## 2016-06-03 DIAGNOSIS — H401131 Primary open-angle glaucoma, bilateral, mild stage: Secondary | ICD-10-CM | POA: Diagnosis not present

## 2016-06-12 DIAGNOSIS — Z7901 Long term (current) use of anticoagulants: Secondary | ICD-10-CM | POA: Diagnosis not present

## 2016-06-19 DIAGNOSIS — E291 Testicular hypofunction: Secondary | ICD-10-CM | POA: Diagnosis not present

## 2016-07-07 DIAGNOSIS — W57XXXA Bitten or stung by nonvenomous insect and other nonvenomous arthropods, initial encounter: Secondary | ICD-10-CM | POA: Diagnosis not present

## 2016-07-07 DIAGNOSIS — Z7901 Long term (current) use of anticoagulants: Secondary | ICD-10-CM | POA: Diagnosis not present

## 2016-07-07 DIAGNOSIS — S40861A Insect bite (nonvenomous) of right upper arm, initial encounter: Secondary | ICD-10-CM | POA: Diagnosis not present

## 2016-07-07 DIAGNOSIS — R42 Dizziness and giddiness: Secondary | ICD-10-CM | POA: Diagnosis not present

## 2016-07-08 DIAGNOSIS — Z7901 Long term (current) use of anticoagulants: Secondary | ICD-10-CM | POA: Diagnosis not present

## 2016-07-08 DIAGNOSIS — E878 Other disorders of electrolyte and fluid balance, not elsewhere classified: Secondary | ICD-10-CM | POA: Diagnosis not present

## 2016-07-10 DIAGNOSIS — E291 Testicular hypofunction: Secondary | ICD-10-CM | POA: Diagnosis not present

## 2016-07-14 DIAGNOSIS — Z7901 Long term (current) use of anticoagulants: Secondary | ICD-10-CM | POA: Diagnosis not present

## 2016-07-28 DIAGNOSIS — Z7901 Long term (current) use of anticoagulants: Secondary | ICD-10-CM | POA: Diagnosis not present

## 2016-07-28 DIAGNOSIS — I482 Chronic atrial fibrillation: Secondary | ICD-10-CM | POA: Diagnosis not present

## 2016-07-31 DIAGNOSIS — E291 Testicular hypofunction: Secondary | ICD-10-CM | POA: Diagnosis not present

## 2016-08-11 DIAGNOSIS — Z8673 Personal history of transient ischemic attack (TIA), and cerebral infarction without residual deficits: Secondary | ICD-10-CM | POA: Diagnosis not present

## 2016-08-11 DIAGNOSIS — Z7901 Long term (current) use of anticoagulants: Secondary | ICD-10-CM | POA: Diagnosis not present

## 2016-08-11 DIAGNOSIS — E668 Other obesity: Secondary | ICD-10-CM | POA: Diagnosis not present

## 2016-08-11 DIAGNOSIS — I482 Chronic atrial fibrillation: Secondary | ICD-10-CM | POA: Diagnosis not present

## 2016-08-12 DIAGNOSIS — I739 Peripheral vascular disease, unspecified: Secondary | ICD-10-CM | POA: Diagnosis not present

## 2016-08-12 DIAGNOSIS — L84 Corns and callosities: Secondary | ICD-10-CM | POA: Diagnosis not present

## 2016-08-12 DIAGNOSIS — L603 Nail dystrophy: Secondary | ICD-10-CM | POA: Diagnosis not present

## 2016-08-21 DIAGNOSIS — E291 Testicular hypofunction: Secondary | ICD-10-CM | POA: Diagnosis not present

## 2016-08-25 DIAGNOSIS — Z7901 Long term (current) use of anticoagulants: Secondary | ICD-10-CM | POA: Diagnosis not present

## 2016-09-11 DIAGNOSIS — E291 Testicular hypofunction: Secondary | ICD-10-CM | POA: Diagnosis not present

## 2016-09-24 DIAGNOSIS — Z7901 Long term (current) use of anticoagulants: Secondary | ICD-10-CM | POA: Diagnosis not present

## 2016-09-28 DIAGNOSIS — Z7901 Long term (current) use of anticoagulants: Secondary | ICD-10-CM | POA: Diagnosis not present

## 2016-10-02 DIAGNOSIS — E291 Testicular hypofunction: Secondary | ICD-10-CM | POA: Diagnosis not present

## 2016-10-12 DIAGNOSIS — Z7901 Long term (current) use of anticoagulants: Secondary | ICD-10-CM | POA: Diagnosis not present

## 2016-10-14 DIAGNOSIS — H401132 Primary open-angle glaucoma, bilateral, moderate stage: Secondary | ICD-10-CM | POA: Diagnosis not present

## 2016-10-23 DIAGNOSIS — E291 Testicular hypofunction: Secondary | ICD-10-CM | POA: Diagnosis not present

## 2016-10-26 DIAGNOSIS — Z7901 Long term (current) use of anticoagulants: Secondary | ICD-10-CM | POA: Diagnosis not present

## 2016-11-13 DIAGNOSIS — E291 Testicular hypofunction: Secondary | ICD-10-CM | POA: Diagnosis not present

## 2016-11-20 DIAGNOSIS — L570 Actinic keratosis: Secondary | ICD-10-CM | POA: Diagnosis not present

## 2016-11-20 DIAGNOSIS — L218 Other seborrheic dermatitis: Secondary | ICD-10-CM | POA: Diagnosis not present

## 2016-11-20 DIAGNOSIS — X32XXXD Exposure to sunlight, subsequent encounter: Secondary | ICD-10-CM | POA: Diagnosis not present

## 2016-11-24 DIAGNOSIS — Z7901 Long term (current) use of anticoagulants: Secondary | ICD-10-CM | POA: Diagnosis not present

## 2016-12-04 DIAGNOSIS — E291 Testicular hypofunction: Secondary | ICD-10-CM | POA: Diagnosis not present

## 2016-12-07 DIAGNOSIS — Z7901 Long term (current) use of anticoagulants: Secondary | ICD-10-CM | POA: Diagnosis not present

## 2016-12-25 DIAGNOSIS — E291 Testicular hypofunction: Secondary | ICD-10-CM | POA: Diagnosis not present

## 2017-01-04 DIAGNOSIS — Z7901 Long term (current) use of anticoagulants: Secondary | ICD-10-CM | POA: Diagnosis not present

## 2017-01-07 DIAGNOSIS — L820 Inflamed seborrheic keratosis: Secondary | ICD-10-CM | POA: Diagnosis not present

## 2017-01-11 DIAGNOSIS — Z7901 Long term (current) use of anticoagulants: Secondary | ICD-10-CM | POA: Diagnosis not present

## 2017-01-15 DIAGNOSIS — E291 Testicular hypofunction: Secondary | ICD-10-CM | POA: Diagnosis not present

## 2017-01-25 DIAGNOSIS — Z7901 Long term (current) use of anticoagulants: Secondary | ICD-10-CM | POA: Diagnosis not present

## 2017-01-26 DIAGNOSIS — I739 Peripheral vascular disease, unspecified: Secondary | ICD-10-CM | POA: Diagnosis not present

## 2017-01-26 DIAGNOSIS — L603 Nail dystrophy: Secondary | ICD-10-CM | POA: Diagnosis not present

## 2017-02-09 DIAGNOSIS — H401132 Primary open-angle glaucoma, bilateral, moderate stage: Secondary | ICD-10-CM | POA: Diagnosis not present

## 2017-02-09 DIAGNOSIS — H5203 Hypermetropia, bilateral: Secondary | ICD-10-CM | POA: Diagnosis not present

## 2017-02-09 DIAGNOSIS — H52223 Regular astigmatism, bilateral: Secondary | ICD-10-CM | POA: Diagnosis not present

## 2017-02-09 DIAGNOSIS — H524 Presbyopia: Secondary | ICD-10-CM | POA: Diagnosis not present

## 2017-02-16 DIAGNOSIS — E291 Testicular hypofunction: Secondary | ICD-10-CM | POA: Diagnosis not present

## 2017-02-18 DIAGNOSIS — I482 Chronic atrial fibrillation: Secondary | ICD-10-CM | POA: Diagnosis not present

## 2017-02-18 DIAGNOSIS — Z8673 Personal history of transient ischemic attack (TIA), and cerebral infarction without residual deficits: Secondary | ICD-10-CM | POA: Diagnosis not present

## 2017-02-18 DIAGNOSIS — E668 Other obesity: Secondary | ICD-10-CM | POA: Diagnosis not present

## 2017-02-18 DIAGNOSIS — Z7901 Long term (current) use of anticoagulants: Secondary | ICD-10-CM | POA: Diagnosis not present

## 2017-02-22 DIAGNOSIS — R251 Tremor, unspecified: Secondary | ICD-10-CM | POA: Diagnosis not present

## 2017-02-22 DIAGNOSIS — F418 Other specified anxiety disorders: Secondary | ICD-10-CM | POA: Diagnosis not present

## 2017-02-22 DIAGNOSIS — Z7901 Long term (current) use of anticoagulants: Secondary | ICD-10-CM | POA: Diagnosis not present

## 2017-03-04 DIAGNOSIS — Z7189 Other specified counseling: Secondary | ICD-10-CM | POA: Diagnosis not present

## 2017-03-04 DIAGNOSIS — Z23 Encounter for immunization: Secondary | ICD-10-CM | POA: Diagnosis not present

## 2017-03-04 DIAGNOSIS — R251 Tremor, unspecified: Secondary | ICD-10-CM | POA: Diagnosis not present

## 2017-03-09 DIAGNOSIS — E291 Testicular hypofunction: Secondary | ICD-10-CM | POA: Diagnosis not present

## 2017-03-22 DIAGNOSIS — Z7901 Long term (current) use of anticoagulants: Secondary | ICD-10-CM | POA: Diagnosis not present

## 2017-03-30 DIAGNOSIS — E291 Testicular hypofunction: Secondary | ICD-10-CM | POA: Diagnosis not present

## 2017-04-19 DIAGNOSIS — Z7901 Long term (current) use of anticoagulants: Secondary | ICD-10-CM | POA: Diagnosis not present

## 2017-04-20 DIAGNOSIS — E291 Testicular hypofunction: Secondary | ICD-10-CM | POA: Diagnosis not present

## 2017-05-04 DIAGNOSIS — H401132 Primary open-angle glaucoma, bilateral, moderate stage: Secondary | ICD-10-CM | POA: Diagnosis not present

## 2017-05-14 DIAGNOSIS — E291 Testicular hypofunction: Secondary | ICD-10-CM | POA: Diagnosis not present

## 2017-05-14 DIAGNOSIS — N400 Enlarged prostate without lower urinary tract symptoms: Secondary | ICD-10-CM | POA: Diagnosis not present

## 2017-05-14 DIAGNOSIS — N402 Nodular prostate without lower urinary tract symptoms: Secondary | ICD-10-CM | POA: Diagnosis not present

## 2017-05-17 DIAGNOSIS — Z7901 Long term (current) use of anticoagulants: Secondary | ICD-10-CM | POA: Diagnosis not present

## 2017-05-19 DIAGNOSIS — E291 Testicular hypofunction: Secondary | ICD-10-CM | POA: Diagnosis not present

## 2017-05-25 DIAGNOSIS — M5136 Other intervertebral disc degeneration, lumbar region: Secondary | ICD-10-CM | POA: Diagnosis not present

## 2017-06-01 DIAGNOSIS — L84 Corns and callosities: Secondary | ICD-10-CM | POA: Diagnosis not present

## 2017-06-01 DIAGNOSIS — M792 Neuralgia and neuritis, unspecified: Secondary | ICD-10-CM | POA: Diagnosis not present

## 2017-06-01 DIAGNOSIS — I739 Peripheral vascular disease, unspecified: Secondary | ICD-10-CM | POA: Diagnosis not present

## 2017-06-01 DIAGNOSIS — L603 Nail dystrophy: Secondary | ICD-10-CM | POA: Diagnosis not present

## 2017-06-11 DIAGNOSIS — E291 Testicular hypofunction: Secondary | ICD-10-CM | POA: Diagnosis not present

## 2017-06-14 DIAGNOSIS — Z7901 Long term (current) use of anticoagulants: Secondary | ICD-10-CM | POA: Diagnosis not present

## 2017-07-02 DIAGNOSIS — E291 Testicular hypofunction: Secondary | ICD-10-CM | POA: Diagnosis not present

## 2017-07-12 DIAGNOSIS — Z7901 Long term (current) use of anticoagulants: Secondary | ICD-10-CM | POA: Diagnosis not present

## 2017-07-23 DIAGNOSIS — E291 Testicular hypofunction: Secondary | ICD-10-CM | POA: Diagnosis not present

## 2017-08-03 DIAGNOSIS — H401132 Primary open-angle glaucoma, bilateral, moderate stage: Secondary | ICD-10-CM | POA: Diagnosis not present

## 2017-08-09 DIAGNOSIS — Z7901 Long term (current) use of anticoagulants: Secondary | ICD-10-CM | POA: Diagnosis not present

## 2017-08-13 DIAGNOSIS — E291 Testicular hypofunction: Secondary | ICD-10-CM | POA: Diagnosis not present

## 2017-08-17 DIAGNOSIS — Z7901 Long term (current) use of anticoagulants: Secondary | ICD-10-CM | POA: Diagnosis not present

## 2017-08-17 DIAGNOSIS — E7849 Other hyperlipidemia: Secondary | ICD-10-CM | POA: Diagnosis not present

## 2017-08-17 DIAGNOSIS — E668 Other obesity: Secondary | ICD-10-CM | POA: Diagnosis not present

## 2017-08-17 DIAGNOSIS — I482 Chronic atrial fibrillation: Secondary | ICD-10-CM | POA: Diagnosis not present

## 2017-08-24 DIAGNOSIS — I739 Peripheral vascular disease, unspecified: Secondary | ICD-10-CM | POA: Diagnosis not present

## 2017-08-24 DIAGNOSIS — L603 Nail dystrophy: Secondary | ICD-10-CM | POA: Diagnosis not present

## 2017-08-24 DIAGNOSIS — L84 Corns and callosities: Secondary | ICD-10-CM | POA: Diagnosis not present

## 2017-08-24 DIAGNOSIS — M12272 Villonodular synovitis (pigmented), left ankle and foot: Secondary | ICD-10-CM | POA: Diagnosis not present

## 2017-09-02 DIAGNOSIS — M48061 Spinal stenosis, lumbar region without neurogenic claudication: Secondary | ICD-10-CM | POA: Diagnosis not present

## 2017-09-03 DIAGNOSIS — E291 Testicular hypofunction: Secondary | ICD-10-CM | POA: Diagnosis not present

## 2017-09-06 DIAGNOSIS — Z7901 Long term (current) use of anticoagulants: Secondary | ICD-10-CM | POA: Diagnosis not present

## 2017-09-14 DIAGNOSIS — F319 Bipolar disorder, unspecified: Secondary | ICD-10-CM | POA: Diagnosis not present

## 2017-09-17 DIAGNOSIS — Z79899 Other long term (current) drug therapy: Secondary | ICD-10-CM | POA: Diagnosis not present

## 2017-09-17 DIAGNOSIS — F3176 Bipolar disorder, in full remission, most recent episode depressed: Secondary | ICD-10-CM | POA: Diagnosis not present

## 2017-09-24 DIAGNOSIS — E291 Testicular hypofunction: Secondary | ICD-10-CM | POA: Diagnosis not present

## 2017-10-04 DIAGNOSIS — Z7901 Long term (current) use of anticoagulants: Secondary | ICD-10-CM | POA: Diagnosis not present

## 2017-10-05 DIAGNOSIS — H401131 Primary open-angle glaucoma, bilateral, mild stage: Secondary | ICD-10-CM | POA: Diagnosis not present

## 2017-10-13 DIAGNOSIS — E291 Testicular hypofunction: Secondary | ICD-10-CM | POA: Diagnosis not present

## 2017-10-21 DIAGNOSIS — Z7901 Long term (current) use of anticoagulants: Secondary | ICD-10-CM | POA: Diagnosis not present

## 2017-10-21 DIAGNOSIS — R2681 Unsteadiness on feet: Secondary | ICD-10-CM | POA: Diagnosis not present

## 2017-10-22 DIAGNOSIS — I872 Venous insufficiency (chronic) (peripheral): Secondary | ICD-10-CM | POA: Diagnosis not present

## 2017-10-28 DIAGNOSIS — Z7901 Long term (current) use of anticoagulants: Secondary | ICD-10-CM | POA: Diagnosis not present

## 2017-11-03 DIAGNOSIS — E291 Testicular hypofunction: Secondary | ICD-10-CM | POA: Diagnosis not present

## 2017-11-16 DIAGNOSIS — I739 Peripheral vascular disease, unspecified: Secondary | ICD-10-CM | POA: Diagnosis not present

## 2017-11-16 DIAGNOSIS — L84 Corns and callosities: Secondary | ICD-10-CM | POA: Diagnosis not present

## 2017-11-16 DIAGNOSIS — L603 Nail dystrophy: Secondary | ICD-10-CM | POA: Diagnosis not present

## 2017-11-23 DIAGNOSIS — B353 Tinea pedis: Secondary | ICD-10-CM | POA: Diagnosis not present

## 2017-11-23 DIAGNOSIS — B355 Tinea imbricata: Secondary | ICD-10-CM | POA: Diagnosis not present

## 2017-11-23 DIAGNOSIS — L84 Corns and callosities: Secondary | ICD-10-CM | POA: Diagnosis not present

## 2017-11-23 DIAGNOSIS — I872 Venous insufficiency (chronic) (peripheral): Secondary | ICD-10-CM | POA: Diagnosis not present

## 2017-11-24 DIAGNOSIS — E291 Testicular hypofunction: Secondary | ICD-10-CM | POA: Diagnosis not present

## 2017-11-25 DIAGNOSIS — Z7901 Long term (current) use of anticoagulants: Secondary | ICD-10-CM | POA: Diagnosis not present

## 2017-12-15 DIAGNOSIS — E291 Testicular hypofunction: Secondary | ICD-10-CM | POA: Diagnosis not present

## 2017-12-21 DIAGNOSIS — B355 Tinea imbricata: Secondary | ICD-10-CM | POA: Diagnosis not present

## 2017-12-21 DIAGNOSIS — L570 Actinic keratosis: Secondary | ICD-10-CM | POA: Diagnosis not present

## 2017-12-21 DIAGNOSIS — I872 Venous insufficiency (chronic) (peripheral): Secondary | ICD-10-CM | POA: Diagnosis not present

## 2017-12-21 DIAGNOSIS — X32XXXD Exposure to sunlight, subsequent encounter: Secondary | ICD-10-CM | POA: Diagnosis not present

## 2017-12-23 DIAGNOSIS — Z7901 Long term (current) use of anticoagulants: Secondary | ICD-10-CM | POA: Diagnosis not present

## 2017-12-31 DIAGNOSIS — Z7901 Long term (current) use of anticoagulants: Secondary | ICD-10-CM | POA: Diagnosis not present

## 2018-01-05 DIAGNOSIS — E291 Testicular hypofunction: Secondary | ICD-10-CM | POA: Diagnosis not present

## 2018-01-26 DIAGNOSIS — E291 Testicular hypofunction: Secondary | ICD-10-CM | POA: Diagnosis not present

## 2018-01-27 DIAGNOSIS — R251 Tremor, unspecified: Secondary | ICD-10-CM | POA: Insufficient documentation

## 2018-01-27 HISTORY — DX: Tremor, unspecified: R25.1

## 2018-01-28 DIAGNOSIS — M549 Dorsalgia, unspecified: Secondary | ICD-10-CM | POA: Diagnosis not present

## 2018-01-28 DIAGNOSIS — Z7901 Long term (current) use of anticoagulants: Secondary | ICD-10-CM | POA: Diagnosis not present

## 2018-02-09 DIAGNOSIS — H26493 Other secondary cataract, bilateral: Secondary | ICD-10-CM | POA: Diagnosis not present

## 2018-02-09 DIAGNOSIS — H5212 Myopia, left eye: Secondary | ICD-10-CM | POA: Diagnosis not present

## 2018-02-09 DIAGNOSIS — H401132 Primary open-angle glaucoma, bilateral, moderate stage: Secondary | ICD-10-CM | POA: Diagnosis not present

## 2018-02-09 DIAGNOSIS — H5201 Hypermetropia, right eye: Secondary | ICD-10-CM | POA: Diagnosis not present

## 2018-02-16 ENCOUNTER — Inpatient Hospital Stay (HOSPITAL_COMMUNITY)
Admission: EM | Admit: 2018-02-16 | Discharge: 2018-02-19 | DRG: 309 | Disposition: A | Discharge status: Home Health | Payer: Medicare Other | Attending: Internal Medicine | Admitting: Internal Medicine

## 2018-02-16 ENCOUNTER — Observation Stay (HOSPITAL_COMMUNITY): Payer: Medicare Other

## 2018-02-16 ENCOUNTER — Encounter (HOSPITAL_COMMUNITY): Payer: Self-pay | Admitting: Internal Medicine

## 2018-02-16 ENCOUNTER — Emergency Department (HOSPITAL_COMMUNITY): Payer: Medicare Other

## 2018-02-16 DIAGNOSIS — G912 (Idiopathic) normal pressure hydrocephalus: Secondary | ICD-10-CM | POA: Diagnosis not present

## 2018-02-16 DIAGNOSIS — I340 Nonrheumatic mitral (valve) insufficiency: Secondary | ICD-10-CM | POA: Diagnosis present

## 2018-02-16 DIAGNOSIS — R251 Tremor, unspecified: Secondary | ICD-10-CM | POA: Diagnosis not present

## 2018-02-16 DIAGNOSIS — G20 Parkinson's disease: Secondary | ICD-10-CM | POA: Diagnosis not present

## 2018-02-16 DIAGNOSIS — Z6835 Body mass index (BMI) 35.0-35.9, adult: Secondary | ICD-10-CM

## 2018-02-16 DIAGNOSIS — Z7901 Long term (current) use of anticoagulants: Secondary | ICD-10-CM

## 2018-02-16 DIAGNOSIS — R42 Dizziness and giddiness: Secondary | ICD-10-CM | POA: Diagnosis not present

## 2018-02-16 DIAGNOSIS — I4892 Unspecified atrial flutter: Secondary | ICD-10-CM | POA: Diagnosis not present

## 2018-02-16 DIAGNOSIS — R269 Unspecified abnormalities of gait and mobility: Secondary | ICD-10-CM | POA: Diagnosis present

## 2018-02-16 DIAGNOSIS — I482 Chronic atrial fibrillation: Principal | ICD-10-CM | POA: Diagnosis present

## 2018-02-16 DIAGNOSIS — Z79899 Other long term (current) drug therapy: Secondary | ICD-10-CM | POA: Diagnosis not present

## 2018-02-16 DIAGNOSIS — D751 Secondary polycythemia: Secondary | ICD-10-CM | POA: Diagnosis present

## 2018-02-16 DIAGNOSIS — Z8679 Personal history of other diseases of the circulatory system: Secondary | ICD-10-CM | POA: Diagnosis not present

## 2018-02-16 DIAGNOSIS — Z9181 History of falling: Secondary | ICD-10-CM | POA: Diagnosis not present

## 2018-02-16 DIAGNOSIS — R2681 Unsteadiness on feet: Secondary | ICD-10-CM | POA: Diagnosis present

## 2018-02-16 DIAGNOSIS — E669 Obesity, unspecified: Secondary | ICD-10-CM | POA: Diagnosis not present

## 2018-02-16 DIAGNOSIS — I480 Paroxysmal atrial fibrillation: Secondary | ICD-10-CM

## 2018-02-16 DIAGNOSIS — Z888 Allergy status to other drugs, medicaments and biological substances status: Secondary | ICD-10-CM

## 2018-02-16 DIAGNOSIS — G250 Essential tremor: Secondary | ICD-10-CM

## 2018-02-16 DIAGNOSIS — I4891 Unspecified atrial fibrillation: Secondary | ICD-10-CM | POA: Diagnosis not present

## 2018-02-16 DIAGNOSIS — N289 Disorder of kidney and ureter, unspecified: Secondary | ICD-10-CM

## 2018-02-16 DIAGNOSIS — I4819 Other persistent atrial fibrillation: Secondary | ICD-10-CM | POA: Diagnosis present

## 2018-02-16 DIAGNOSIS — W19XXXA Unspecified fall, initial encounter: Secondary | ICD-10-CM | POA: Diagnosis not present

## 2018-02-16 DIAGNOSIS — H409 Unspecified glaucoma: Secondary | ICD-10-CM | POA: Diagnosis not present

## 2018-02-16 DIAGNOSIS — F319 Bipolar disorder, unspecified: Secondary | ICD-10-CM | POA: Diagnosis present

## 2018-02-16 DIAGNOSIS — R000 Tachycardia, unspecified: Secondary | ICD-10-CM | POA: Diagnosis not present

## 2018-02-16 DIAGNOSIS — I10 Essential (primary) hypertension: Secondary | ICD-10-CM | POA: Diagnosis not present

## 2018-02-16 DIAGNOSIS — Z87891 Personal history of nicotine dependence: Secondary | ICD-10-CM | POA: Diagnosis not present

## 2018-02-16 DIAGNOSIS — R2689 Other abnormalities of gait and mobility: Secondary | ICD-10-CM | POA: Diagnosis not present

## 2018-02-16 DIAGNOSIS — Z8673 Personal history of transient ischemic attack (TIA), and cerebral infarction without residual deficits: Secondary | ICD-10-CM

## 2018-02-16 DIAGNOSIS — R791 Abnormal coagulation profile: Secondary | ICD-10-CM

## 2018-02-16 DIAGNOSIS — R930 Abnormal findings on diagnostic imaging of skull and head, not elsewhere classified: Secondary | ICD-10-CM

## 2018-02-16 HISTORY — DX: Disorder of kidney and ureter, unspecified: N28.9

## 2018-02-16 HISTORY — DX: Unspecified atrial fibrillation: I48.91

## 2018-02-16 HISTORY — DX: Secondary polycythemia: D75.1

## 2018-02-16 HISTORY — DX: Unsteadiness on feet: R26.81

## 2018-02-16 HISTORY — DX: Paroxysmal atrial fibrillation: I48.0

## 2018-02-16 HISTORY — DX: Tremor, unspecified: R25.1

## 2018-02-16 HISTORY — DX: Nonrheumatic mitral (valve) insufficiency: I34.0

## 2018-02-16 LAB — COMPREHENSIVE METABOLIC PANEL
ALT: 23 U/L (ref 0–44)
AST: 35 U/L (ref 15–41)
Albumin: 3.9 g/dL (ref 3.5–5.0)
Alkaline Phosphatase: 74 U/L (ref 38–126)
Anion gap: 8 (ref 5–15)
BILIRUBIN TOTAL: 1.9 mg/dL — AB (ref 0.3–1.2)
BUN: 12 mg/dL (ref 8–23)
CALCIUM: 9.7 mg/dL (ref 8.9–10.3)
CO2: 27 mmol/L (ref 22–32)
CREATININE: 1.38 mg/dL — AB (ref 0.61–1.24)
Chloride: 106 mmol/L (ref 98–111)
GFR, EST AFRICAN AMERICAN: 53 mL/min — AB (ref >60)
GFR, EST NON AFRICAN AMERICAN: 46 mL/min — AB (ref >60)
Glucose, Bld: 107 mg/dL — ABNORMAL HIGH (ref 70–99)
Potassium: 4.7 mmol/L (ref 3.5–5.1)
Sodium: 141 mmol/L (ref 135–145)
Total Protein: 7.5 g/dL (ref 6.5–8.1)

## 2018-02-16 LAB — CBC WITH DIFFERENTIAL/PLATELET
ABS IMMATURE GRANULOCYTES: 0 10*3/uL (ref 0.0–0.1)
BASOS PCT: 1 %
Basophils Absolute: 0.1 10*3/uL (ref 0.0–0.1)
EOS ABS: 0 10*3/uL (ref 0.0–0.7)
Eosinophils Relative: 0 %
HEMATOCRIT: 54.8 % — AB (ref 39.0–52.0)
Hemoglobin: 17.4 g/dL — ABNORMAL HIGH (ref 13.0–17.0)
IMMATURE GRANULOCYTES: 0 %
LYMPHS ABS: 1.3 10*3/uL (ref 0.7–4.0)
Lymphocytes Relative: 12 %
MCH: 30.9 pg (ref 26.0–34.0)
MCHC: 31.8 g/dL (ref 30.0–36.0)
MCV: 97.2 fL (ref 78.0–100.0)
MONO ABS: 1 10*3/uL (ref 0.1–1.0)
MONOS PCT: 9 %
NEUTROS PCT: 78 %
Neutro Abs: 8.1 10*3/uL — ABNORMAL HIGH (ref 1.7–7.7)
Platelets: UNDETERMINED 10*3/uL (ref 150–400)
RBC: 5.64 MIL/uL (ref 4.22–5.81)
RDW: 13.2 % (ref 11.5–15.5)
WBC: 10.4 10*3/uL (ref 4.0–10.5)

## 2018-02-16 LAB — URINALYSIS, ROUTINE W REFLEX MICROSCOPIC
BILIRUBIN URINE: NEGATIVE
Bacteria, UA: NONE SEEN
Glucose, UA: NEGATIVE mg/dL
Ketones, ur: NEGATIVE mg/dL
Leukocytes, UA: NEGATIVE
NITRITE: NEGATIVE
PH: 7 (ref 5.0–8.0)
Protein, ur: NEGATIVE mg/dL
SPECIFIC GRAVITY, URINE: 1.005 (ref 1.005–1.030)

## 2018-02-16 LAB — PROTIME-INR
INR: 1.57
Prothrombin Time: 18.6 seconds — ABNORMAL HIGH (ref 11.4–15.2)

## 2018-02-16 LAB — I-STAT TROPONIN, ED: Troponin i, poc: 0.01 ng/mL (ref 0.00–0.08)

## 2018-02-16 MED ORDER — ACETAMINOPHEN 650 MG RE SUPP: 650.0000 mg | Freq: Four times a day (QID) | RECTAL | Status: DC | PRN

## 2018-02-16 MED ORDER — WARFARIN SODIUM 5 MG PO TABS
5.0000 mg | ORAL_TABLET | Freq: Once | ORAL | Status: AC
  Administered 2018-02-16: 5 mg via ORAL
  Filled 2018-02-16: qty 1

## 2018-02-16 MED ORDER — DILTIAZEM LOAD VIA INFUSION
10.0000 mg | Freq: Once | INTRAVENOUS | Status: AC
  Administered 2018-02-16: 10 mg via INTRAVENOUS
  Filled 2018-02-16: qty 10

## 2018-02-16 MED ORDER — ACETAMINOPHEN 325 MG PO TABS
650.0000 mg | ORAL_TABLET | Freq: Four times a day (QID) | ORAL | Status: DC | PRN
  Administered 2018-02-17: 650 mg via ORAL
  Filled 2018-02-16: qty 2

## 2018-02-16 MED ORDER — LORAZEPAM 2 MG/ML IJ SOLN
2.0000 mg | Freq: Once | INTRAMUSCULAR | Status: AC
  Administered 2018-02-16: 2 mg via INTRAVENOUS
  Filled 2018-02-16: qty 1

## 2018-02-16 MED ORDER — SODIUM CHLORIDE 0.9 % IV BOLUS
1000.0000 mL | Freq: Once | INTRAVENOUS | Status: AC
  Administered 2018-02-16: 1000 mL via INTRAVENOUS

## 2018-02-16 MED ORDER — WARFARIN - PHARMACIST DOSING INPATIENT
Freq: Every day | Status: DC
  Administered 2018-02-17: 18:00:00

## 2018-02-16 MED ORDER — DILTIAZEM HCL-DEXTROSE 100-5 MG/100ML-% IV SOLN (PREMIX)
5.0000 mg/h | INTRAVENOUS | Status: DC
  Administered 2018-02-16: 5 mg/h via INTRAVENOUS
  Filled 2018-02-16: qty 100

## 2018-02-16 MED ORDER — CLONAZEPAM 0.5 MG PO TABS
0.5000 mg | ORAL_TABLET | Freq: Two times a day (BID) | ORAL | Status: DC
  Administered 2018-02-16 – 2018-02-19 (×6): 0.5 mg via ORAL
  Filled 2018-02-16 (×6): qty 1

## 2018-02-16 MED ORDER — SODIUM CHLORIDE 0.9 % IV SOLN
INTRAVENOUS | Status: AC
  Administered 2018-02-16: 21:00:00 via INTRAVENOUS

## 2018-02-16 NOTE — ED Triage Notes (Addendum)
Pt arrived via [facility] from [facility] where pt was walking to an appointment and became dizzy. Pt lost his balance and fell but was able to catch himself with his arms. Pt denies injury or LOC. Pt has hx of A-flutter. EMS EKG reported the same; rate was 98-130. Pt denies feeling badly at time of triage and is A&Ox4. PT was diaphoretic on intake. EMS v/s bp 156/94, Sp02 97%ra.

## 2018-02-16 NOTE — ED Notes (Signed)
Unable to collect blood, phlebotomy notified for assistance.

## 2018-02-16 NOTE — H&P (Addendum)
TRH H&P   Patient Demographics:    Sean Bishop, is a 84 y.o. male  MRN: [id number]   DOB - 1933-04-07  Admit Date - 02/16/2018  Outpatient Primary MD for the patient is Sean Bishop, [name], MD  Referring MD/NP/PA:   Rhona RaiderMercedes Street  Outpatient Specialists:  Viann FishSpencer Tilley   Patient coming from:  home  Chief Complaint  Patient presents with  . Near Syncope      HPI:    Sean ServeJames Bishop  is a 84 y.o. male, with hypertension, pafib, CVA,  Bipolar, who presents with increase in shaking of hands and also the left leg.  He has had tremor for the past 4 years.  Pt apparently thought to be so unstable as to not be able to go safely home. Pt denies fever, chills, cp, palp, sob, n/v, diarrhea, brbpr, black stool, dysuria, hematuria. .  Pt denies etoh use.  Pt seems slightly confused axox1  In Ed, pt noted to be in Afib with RVR. Hr 117 on EKG CT brain IMPRESSION: Mild chronic ischemic white matter disease is noted. Mild ventricular dilatation is noted; this is concerning for possible normal pressure hydrocephalus. Clinical correlation is recommended. No other abnormality is noted.  Wbc 10.4, Hgb 17.4, Plt clumped  Na 141, K 4.7, Bun 12, creatinine 1.38 Ast 35, Alt 23 Urinalysis negative Trop 0.01  INR 1.57   Neurology consulted by ED for evaluation of tremor  Pt will be admitted for Afib with RVR , altered mental status, and increase in tremor, and gait instability.     Review of systems:    In addition to the HPI above,   No Fever-chills, No Headache, No changes with Vision or hearing, No problems swallowing food or Liquids, No Chest pain, Cough or Shortness of Breath, No Abdominal pain, No Nausea or Vommitting, Bowel movements are regular, No Blood in stool or Urine, No dysuria, No new skin rashes or bruises, No new joints pains-aches,  No new weakness, tingling,  numbness in any extremity, No recent weight gain or loss, No polyuria, polydypsia or polyphagia, No significant Mental Stressors.  A full 10 point Review of Systems was done, except as stated above, all other Review of Systems were negative.   With Past History of the following :    Past Medical History:  Diagnosis Date  . Atrial fibrillation (HCC)   . Bipolar 1 disorder (HCC)   . DJD (degenerative joint disease)   . Hypertension   . Stroke [facility](HCC)       Past Surgical History:  Procedure Laterality Date  . SHOULDER SURGERY        Social History:     Social History   Tobacco Use  . Smoking status: Former Games developermoker  . Smokeless tobacco: Never Used  Substance Use Topics  . Alcohol use: Yes    Alcohol/week: 1.0 standard drinks  Types: 1 Glasses of wine per week    Comment: 1 per month     Lives - at home  Mobility - walks by self   Family History :     Family History  Problem Relation Age of Onset  . Alcohol abuse Mother   . Cancer Father        Home Medications:   Prior to Admission medications   Medication Sig Start Date End Date Taking? Authorizing Provider  acetaminophen (TYLENOL) 500 MG tablet Take 500 mg by mouth 2 (two) times daily.    Yes [provider]  Ascorbic Acid (VITAMIN C PO) Take 1 tablet by mouth daily.   Yes [provider]  CALCIUM-VITAMIN D PO Take 1 tablet by mouth daily.   Yes [provider]  Cholecalciferol (VITAMIN D PO) Take 1 tablet by mouth daily.   Yes [provider]  dorzolamide-timolol (COSOPT) 22.3-6.8 MG/ML ophthalmic solution Place 1 drop into both eyes every morning. 08/19/13  Yes [provider]  latanoprost (XALATAN) 0.005 % ophthalmic solution Place 1 drop into both eyes at bedtime. 08/19/13  Yes [provider]  lithium carbonate (LITHOBID) 300 MG CR tablet Take 300 mg by mouth 2 (two) times daily. 08/28/13  Yes [provider]  Multiple Vitamin (MULTIVITAMIN  WITH MINERALS) TABS tablet Take 1 tablet by mouth daily.   Yes [provider]  warfarin (COUMADIN) 5 MG tablet Take 5 mg by mouth 2 (two) times daily.  08/23/13  Yes [provider]     Allergies:     Allergies  Allergen Reactions  . Lipitor [Atorvastatin] Other (See Comments)    Joint soreness     Physical Exam:   Vitals  Blood pressure (!) 158/109, pulse (!) 116, temperature 98.1 F (36.7 C), temperature source Oral, resp. rate 20, SpO2 97 %.   1. General  lying in bed in NAD,    2. Normal affect and insight, Not Suicidal or Homicidal, Awake Alert, Oriented X 1.  3. No F.N deficits, ALL C.Nerves Intact, Strength 5/5 all 4 extremities, Sensation intact all 4 extremities, Plantars down going.  4. Ears and Eyes appear Normal, Conjunctivae clear, PERRLA. Moist Oral Mucosa.  5. Supple Neck, No JVD, No cervical lymphadenopathy appriciated, No Carotid Bruits.  6. Symmetrical Chest wall movement, Good air movement bilaterally, CTAB.  7. Irr, irr, s1, s2, 1/6 sem apex  8. Positive Bowel Sounds, Abdomen Soft, No tenderness, No organomegaly appriciated,No rebound -guarding or rigidity.  9.  No Cyanosis, Normal Skin Turgor, No Skin Rash or Bruise.  10. Good muscle tone,  joints appear normal , no effusions, Normal ROM.  11. No Palpable Lymph Nodes in Neck or Axillae  + tremor bilateral hands, slight tremor of left foot, (apparently this got better with ativan) Good finger to nose,      Data Review:    CBC Recent Labs  Lab 02/16/18 1441  WBC 10.4  HGB 17.4*  HCT 54.8*  PLT PLATELET CLUMPS NOTED ON SMEAR, UNABLE TO ESTIMATE  MCV 97.2  MCH 30.9  MCHC 31.8  RDW 13.2  LYMPHSABS 1.3  MONOABS 1.0  EOSABS 0.0  BASOSABS 0.1   ------------------------------------------------------------------------------------------------------------------  Chemistries  Recent Labs  Lab 02/16/18 1441  NA 141  K 4.7  CL 106  CO2 27  GLUCOSE 107*  BUN 12    CREATININE 1.38*  CALCIUM 9.7  AST 35  ALT 23  ALKPHOS 74  BILITOT 1.9*   ------------------------------------------------------------------------------------------------------------------ CrCl cannot  be calculated (Unknown ideal weight.). ------------------------------------------------------------------------------------------------------------------ No results for input(s): TSH, T4TOTAL, T3FREE, THYROIDAB in the last 72 hours.  Invalid input(s): FREET3  Coagulation profile Recent Labs  Lab 02/16/18 1536  INR 1.57   ------------------------------------------------------------------------------------------------------------------- No results for input(s): DDIMER in the last 72 hours. -------------------------------------------------------------------------------------------------------------------  Cardiac Enzymes No results for input(s): CKMB, TROPONINI, MYOGLOBIN in the last 168 hours.  Invalid input(s): CK ------------------------------------------------------------------------------------------------------------------ No results found for: BNP   ---------------------------------------------------------------------------------------------------------------  Urinalysis    Component Value Date/Time   COLORURINE YELLOW 02/16/2018 1448   APPEARANCEUR CLEAR 02/16/2018 1448   LABSPEC 1.005 02/16/2018 1448   PHURINE 7.0 02/16/2018 1448   GLUCOSEU NEGATIVE 02/16/2018 1448   HGBUR SMALL (A) 02/16/2018 1448   BILIRUBINUR NEGATIVE 02/16/2018 1448   KETONESUR NEGATIVE 02/16/2018 1448   PROTEINUR NEGATIVE 02/16/2018 1448   UROBILINOGEN 1.0 01/21/2009 0005   NITRITE NEGATIVE 02/16/2018 1448   LEUKOCYTESUR NEGATIVE 02/16/2018 1448    ----------------------------------------------------------------------------------------------------------------   Imaging Results:    Ct Head Wo Contrast  Result Date: 02/16/2018 CLINICAL DATA:  Dizziness. EXAM: CT HEAD WITHOUT CONTRAST  TECHNIQUE: Contiguous axial images were obtained from the base of the skull through the vertex without intravenous contrast. COMPARISON:  CT scan of August 23, 2009. FINDINGS: Brain: Mild chronic ischemic white matter disease is noted. Mild ventricular dilatation is noted. No midline shift or mass effect is noted. There is no evidence of hemorrhage, mass lesion or acute infarction. Vascular: No hyperdense vessel or unexpected calcification. Skull: Normal. Negative for fracture or focal lesion. Sinuses/Orbits: No acute finding. Other: None. IMPRESSION: Mild chronic ischemic white matter disease is noted. Mild ventricular dilatation is noted; this is concerning for possible normal pressure hydrocephalus. Clinical correlation is recommended. No other abnormality is noted. Electronically Signed   By: Lupita Raider, M.D.   On: 02/16/2018 16:04   changes   Assessment & Plan:    Active Problems:   Gait instability   Tremor   Renal insufficiency   Polycythemia   Hypertension   AF (paroxysmal atrial fibrillation) (HCC)    Afib with RVR Tele Trop I q6h x3 Check TSH Check cardiac echo cardizem GTT Coumadin pharmacy to dose Might have to have discussion regarding alternative since having gait instability and at risk for falling Please contact Dr. Donnie Aho in am regarding this patient  Tremor Clonazepam 0.5mg  po bid as per neurology recommendation verbally to me by [place] Check Lithium level Neurology consulted by ED, appreciate input  Gait instability PT to evaluate and tx  ?AMS Check MRI brain r/o CVA and cause for tremor  Renal insufficiency  Check cmp in an  Glaucoma Cont Xalatan Cont Cosopt     DVT Prophylaxis coumadin - SCDs  AM Labs Ordered, also please review Full Orders  Family Communication: Admission, patients condition and plan of care including tests being ordered have been discussed with the patient  who indicate understanding and agree with the plan and Code  Status.  Code Status  FULL CODE  Likely DC to  home  Condition GUARDED    Consults called: neurology by ED, please call Dr. Donnie Aho in AM  Admission status: observation,  Pt will be admitted observation for Afib with RVR, along with increase in tremor, gait instability,  Pt will require admission due to needing cardizem GTT .  If rate control is easily achievable then will need obs admission .  If rate control difficult may need change to inpatient admission   Time spent in minutes : 60 critical  care   Pearson GrippeJames Geraldina Parrott M.D on 02/16/2018 at 7:23 PM  Between 7am to 7pm - Pager - [phone]  . After 7pm go to [email or web address] - password [email or web address]  [facility] - Office  [phone]

## 2018-02-16 NOTE — ED Notes (Signed)
Neuro at bedside.

## 2018-02-16 NOTE — Progress Notes (Addendum)
ANTICOAGULATION CONSULT NOTE - Initial Consult  Pharmacy Consult for warfarin Indication: atrial fibrillation  Allergies  Allergen Reactions  . Lipitor [Atorvastatin] Other (See Comments)    Joint soreness    Patient Measurements:  Vital Signs: Temp: 98.1 F (36.7 C) (08/21 1133) Temp Source: Oral (08/21 1133) BP: 158/109 (08/21 1900) Pulse Rate: 116 (08/21 1900)  Labs: Recent Labs    02/16/18 1441 02/16/18 1536  HGB 17.4*  --   HCT 54.8*  --   PLT PLATELET CLUMPS NOTED ON SMEAR, UNABLE TO ESTIMATE  --   LABPROT  --  18.6*  INR  --  1.57  CREATININE 1.38*  --     CrCl cannot be calculated (Unknown ideal weight.).   Medical History: Past Medical History:  Diagnosis Date  . Atrial fibrillation (HCC)   . Bipolar 1 disorder (HCC)   . DJD (degenerative joint disease)   . Hypertension   . Mitral regurgitation    mild   . Stroke [facility](HCC)     Assessment: Sean Bishop is an 84 y.o. Male with hx of atrial fibrillation on warfarin. PTA warfarin regimen per patient and wife is 5mg  BID (last dose taken 8/20). He seems confused in regards to his warfarin dosing and does not think that he regularly has his INR checked.   His CHADSVASc = 5 and he is currently subtherapeutic with INR 1.57. Spoke with Dr. Selena BattenKim, we will not bridge this patient. CBC WNL, no s/sx of bleeding noted.    Goal of Therapy:  INR 2-3 Monitor platelets by anticoagulation protocol: Yes   Plan:  Will give warfarin 5mg  x1 tonight, due to uncertainty in dosing regimen. Monitor daily INR, CBC, s/sx of bleeding.  Thank you for involving pharmacy in this patient's care.  Wendelyn Breslowylan Guiselle Mian, PharmD PGY1 Pharmacy Resident Phone: [phone] 02/16/2018 7:46 PM

## 2018-02-16 NOTE — ED Notes (Signed)
This RN assisted MRI staff with switching infusions over to MR-friendly infusion pump. Will transfer fluids back to ED pump following imaging.

## 2018-02-16 NOTE — ED Notes (Signed)
Pt in room at this time  

## 2018-02-16 NOTE — ED Notes (Signed)
ED Provider at bedside. 

## 2018-02-16 NOTE — ED Notes (Signed)
Patient transported to MR. 

## 2018-02-16 NOTE — ED Notes (Signed)
Got patient undress on the monitor did ekg shown to er doctor patient is resting with call bell in reach 

## 2018-02-16 NOTE — ED Provider Notes (Signed)
[facility] EMERGENCY DEPARTMENT Provider Note   CSN: [id number] Arrival date & time: 02/16/18  1131     History   Chief Complaint Chief Complaint  Patient presents with  . Near Syncope    HPI Sean Bishop is a 84 y.o. male with a PMHx of Afib/flutter on coumadin, bipolar 1 disorder, DJD, HTN, CVA, and other conditions listed below, who presents to the ED with complaints of fall and shaking that occurred just prior to arrival.  Patient states that he was walking into an appointment at [facility], he had gotten to the sidewalk and noticed that his right leg was shaking, he tried to put his foot down on the sidewalk and it continued to shake so he tried to stabilize himself with his left leg but was unable to so he "lost his balance" and fell, landing on his right hand and catching himself.  He denies head injury or LOC.  He denies feeling dizzy or lightheaded prior to the fall or since the fall, states that he just felt like his leg was shaking uncontrollably and he could not control it or get his balance.  He states that he has had some issues with balance due to shaking before but not like this.  He will usually use a cane to ambulate because he has had issues with "vertigo and balance", but states that he forgot to bring his cane today.  He denies that he had any vertigo issues today during the fall, just states that he had right leg shaking that he was unable to control.  He did not try anything for his symptoms, and walking or standing seem to aggravate his symptoms.  He has not attempted to ambulate since the fall because "they haven't let me", but when he stands up during evaluation he states that he feels like his legs are shaky.  He denies any dizziness, lightheadedness, headache, vision changes, hitting his head, LOC, back pain, leg pain, numbness, tingling, focal weakness, saddle anesthesia/cauda equina symptoms, recent fevers or chills, CP, SOB, abdominal  pain, nausea, vomiting, diarrhea, constipation, dysuria, hematuria, joint swelling, bruising, abrasions, injury sustained during the incident, or any other complaints at this time.  His PCP is Juluis Rainier at [facility].  He states currently he feels fine except for the shaking when he stood up.  Of note, he states that his RLE has had some erythema to it for about a year, he "uses a cream" that he can't recall the name of it, but reports that it's much better.   The history is provided by the patient and medical records. No language interpreter was used.    Past Medical History:  Diagnosis Date  . Atrial fibrillation   . Bipolar 1 disorder   . DJD (degenerative joint disease)   . Hypertension   . Stroke     There are no active problems to display for this patient.   Past Surgical History:  Procedure Laterality Date  . SHOULDER SURGERY          Home Medications    Prior to Admission medications   Medication Sig Start Date End Date Taking? Authorizing Provider  acetaminophen (TYLENOL) 500 MG tablet Take 1,500 mg by mouth 2 (two) times daily.    [provider]  Ascorbic Acid (VITAMIN C PO) Take 1 tablet by mouth daily.    [provider]  CALCIUM-VITAMIN D PO Take 1 tablet by mouth daily.    [provider]  Cholecalciferol (VITAMIN D PO) Take 1 tablet by mouth daily.    [provider]  dorzolamide-timolol (COSOPT) 22.3-6.8 MG/ML ophthalmic solution Place 1 drop into both eyes every morning. 08/19/13   [provider]  latanoprost (XALATAN) 0.005 % ophthalmic solution Place 1 drop into both eyes at bedtime. 08/19/13   [provider]  lithium carbonate (LITHOBID) 300 MG CR tablet Take 300 mg by mouth 2 (two) times daily. 08/28/13   [provider]  Multiple Vitamin (MULTIVITAMIN WITH MINERALS) TABS tablet Take 1 tablet by mouth daily.    [provider]  warfarin (COUMADIN) 5 MG tablet Take 2.5-5 mg by mouth  daily. Take 2.5 Mon and Thurs, take 1 all other days 08/23/13   [provider]    Family History No family history on file.  Social History Social History   Tobacco Use  . Smoking status: Former Games developermoker  . Smokeless tobacco: Never Used  Substance Use Topics  . Alcohol use: Yes    Alcohol/week: 1.0 standard drinks    Types: 1 Glasses of wine per week    Comment: 1 per month  . Drug use: No     Allergies   Lipitor [atorvastatin]   Review of Systems Review of Systems  Constitutional: Negative for chills and fever.  HENT: Negative for facial swelling (no head inj).   Eyes: Negative for visual disturbance.  Respiratory: Negative for shortness of breath.   Cardiovascular: Negative for chest pain.  Gastrointestinal: Negative for abdominal pain, constipation, diarrhea, nausea and vomiting.  Genitourinary: Negative for dysuria and hematuria.  Musculoskeletal: Negative for arthralgias, back pain, joint swelling, myalgias and neck pain.  Skin: Negative for color change and wound.  Allergic/Immunologic: Negative for immunocompromised state.  Neurological: Positive for tremors. Negative for dizziness, syncope, weakness, light-headedness, numbness and headaches.  Hematological: Bruises/bleeds easily (on coumadin).  Psychiatric/Behavioral: Negative for confusion.   All other systems reviewed and are negative for acute change except as noted in the HPI.    Physical Exam Updated Vital Signs BP (!) 137/92   Pulse 77   Temp 98.1 F (36.7 C) (Oral)   Resp 16   SpO2 97%   Physical Exam  Constitutional: He is oriented to person, place, and time. Vital signs are normal. He appears well-developed and well-nourished.  Non-toxic appearance. No distress.  Afebrile, nontoxic, NAD  HENT:  Head: Normocephalic and atraumatic.  Mouth/Throat: Oropharynx is clear and moist and mucous membranes are normal.  [place]/AT, no evidence of injury to the head/face  Eyes: Pupils are equal, round,  and reactive to light. Conjunctivae and EOM are normal. Right eye exhibits no discharge. Left eye exhibits no discharge.  PERRL, EOMI, no nystagmus  Neck: Normal range of motion. Neck supple. No spinous process tenderness and no muscular tenderness present. No neck rigidity. Normal range of motion present.  FROM intact without spinous process TTP, no bony stepoffs or deformities, no paraspinous muscle TTP or muscle spasms. No rigidity or meningeal signs. No bruising or swelling.   Cardiovascular: Normal rate, normal heart sounds and intact distal pulses. An irregularly irregular rhythm present. Exam reveals no gallop and no friction rub.  No murmur heard. Irregularly irregular rhythm, normal rate, nl s1/s2, no m/r/g, distal pulses intact, no pedal edema   Pulmonary/Chest: Effort normal and breath sounds normal. No respiratory distress. He has no decreased breath sounds. He has no wheezes. He has no rhonchi. He has no rales. He exhibits no tenderness, no crepitus, no deformity  and no retraction.  No evidence of injury/trauma to the chest/torso, no bruising, no crepitus or deformity, no TTP CTAB in all lung fields, no w/r/r, no hypoxia or increased WOB, speaking in full sentences, SpO2 97% on RA   Abdominal: Soft. Normal appearance and bowel sounds are normal. He exhibits no distension. There is no tenderness. There is no rigidity, no rebound, no guarding, no CVA tenderness, no tenderness at McBurney's point and negative Murphy's sign.  Musculoskeletal: Normal range of motion.  MAE x4 Strength and sensation grossly intact in all extremities Distal pulses intact Gait not assessed due to pt feeling unsteady, but pt able to stand unassisted.  Intention tremor noted in b/l upper extremities during activity but doesn't seem to be noted while at rest. L leg shaking intermittently during evaluation but hard to say if pt is in control of this or not, doesn't occur with any specific activity.  No tenderness to  any extremity or to the hips bilaterally, no pelvic instability. No evidence of injury/trauma anywhere.   Neurological: He is alert and oriented to person, place, and time. He has normal strength. He displays tremor. No cranial nerve deficit or sensory deficit. Coordination normal. GCS eye subscore is 4. GCS verbal subscore is 5. GCS motor subscore is 6.  CN 2-12 grossly intact A&O x4 GCS 15 Sensation and strength intact Gait not assessed due to pt feeling unsteady/shaky when standing, but able to stand unassisted Coordination with finger-to-nose WNL although pt noted to have tremor in both hands/arms during this activity, which disappears during rest. L leg noted to shake intermittently during evaluation but not specifically with any activity/action, unclear if he's in control of this or not.  Neg pronator drift   Skin: Skin is warm, dry and intact. No abrasion, no bruising and no rash noted.  No bruising or abrasions anywhere, no evidence of injury/trauma to exposed surfaces RLE with minimal erythema and slight warmth, no tenderness or induration/swelling. Pt states this is "improved" from how it's been for about 63yr.   Psychiatric: He has a normal mood and affect.  Nursing note and vitals reviewed.    ED Treatments / Results  Labs (all labs ordered are listed, but only abnormal results are displayed) Labs Reviewed  CBC WITH DIFFERENTIAL/PLATELET - Abnormal; Notable for the following components:      Result Value   Hemoglobin 17.4 (*)    HCT 54.8 (*)    Neutro Abs 8.1 (*)    All other components within normal limits  COMPREHENSIVE METABOLIC PANEL - Abnormal; Notable for the following components:   Glucose, Bld 107 (*)    Creatinine, Ser 1.38 (*)    Total Bilirubin 1.9 (*)    GFR calc non Af Amer 46 (*)    GFR calc Af Amer 53 (*)    All other components within normal limits  URINALYSIS, ROUTINE W REFLEX MICROSCOPIC - Abnormal; Notable for the following components:   Hgb urine  dipstick SMALL (*)    All other components within normal limits  PROTIME-INR - Abnormal; Notable for the following components:   Prothrombin Time 18.6 (*)    All other components within normal limits  I-STAT TROPONIN, ED    EKG EKG Interpretation  Date/Time:  Wednesday February 16 2018 11:32:48 EDT Ventricular Rate:  117 PR Interval:    QRS Duration: 91 QT Interval:  342 QTC Calculation: 471 R Axis:   29 Text Interpretation:  Atrial fibrillation Borderline repolarization abnormality rate faster, otherwise similar to  Mar 2015 Confirmed by Pricilla Loveless [phone]) on 02/16/2018 12:07:41 PM   Radiology Ct Head Wo Contrast  Result Date: 02/16/2018 CLINICAL DATA:  Dizziness. EXAM: CT HEAD WITHOUT CONTRAST TECHNIQUE: Contiguous axial images were obtained from the base of the skull through the vertex without intravenous contrast. COMPARISON:  CT scan of August 23, 2009. FINDINGS: Brain: Mild chronic ischemic white matter disease is noted. Mild ventricular dilatation is noted. No midline shift or mass effect is noted. There is no evidence of hemorrhage, mass lesion or acute infarction. Vascular: No hyperdense vessel or unexpected calcification. Skull: Normal. Negative for fracture or focal lesion. Sinuses/Orbits: No acute finding. Other: None. IMPRESSION: Mild chronic ischemic white matter disease is noted. Mild ventricular dilatation is noted; this is concerning for possible normal pressure hydrocephalus. Clinical correlation is recommended. No other abnormality is noted. Electronically Signed   By: Lupita Raider, M.D.   On: 02/16/2018 16:04    Procedures Procedures (including critical care time)  Medications Ordered in ED Medications  LORazepam (ATIVAN) injection 2 mg (has no administration in time range)  sodium chloride 0.9 % bolus 1,000 mL (0 mLs Intravenous Stopped 02/16/18 1530)     Initial Impression / Assessment and Plan / ED Course  I have reviewed the triage vital signs and  the nursing notes.  Pertinent labs & imaging results that were available during my care of the patient were reviewed by me and considered in my medical decision making (see chart for details).     84 y.o. male here with shaking/fall that occurred PTA. Pt states his right leg started feeling shaking, he tried to take a step and lost his balance and fell. Denies feeling dizzy/lightheaded. States sometimes he has shaking and unsteadiness so he'll use a cane, but didn't take it today. On exam, no focal neuro deficits but when he stands up he feels his legs are shaky, ambulation not attempted; no pronator drift but with finger-to-nose his hands are somewhat tremulous; his L leg shakes intermittently throughout the visit, hard to say if he's in control of this or not. Afib noted. No areas of tenderness to spine or extremities, no hip tenderness, and pt able to stand without assistance, no evidence of injury/trauma to any portion of his body. Very difficult to say what's happened, EKG without acute ischemic findings; will get basic labs, U/A, and CT head to further evaluate for potential sources of this fall. Doubt need for xray imaging of anything since no definite injury identified. Will give fluids and reassess shortly. Discussed case with my attending Dr. Criss Alvine who agrees with plan.    4:50 PM CBC w/diff WNL. CMP with mildly elevated Cr 1.38 and mildly elevated Tbili 1.9 but otherwise remainder of CMP WNL. INR subtherapeutic, pt states he missed a dose yesterday. Trop neg. U/A with small hgb but otherwise no evidence of infection or other concerning findings. CT head showing mild chronic ischemic white matter disease and mild ventricular dilation concerning for possible normal pressure hydrocephalus. Will consult neurology to discuss case further. Pt and family updated, understand and agree with plan.   4:56 PM Dr. Otelia Limes of neurology returning page, will come evaluate the pt. Will continue to monitor  and await further instruction.   6:33 PM Dr. Otelia Limes of Neurology down to see pt, agrees with admission. Wants me to try 2mg  IV ativan so he can determine if the tremor is an essential tremor or not, but still agrees with admission. Will consult hospitalist for admission.  6:58 PM Dr[facility] returning page and will admit. Holding orders to be placed by admitting team. Please see their notes for further documentation of care. I appreciate their help with this pleasant pt's care. Pt stable at time of admission.    Final Clinical Impressions(s) / ED Diagnoses   Final diagnoses:  Tremors of nervous system  Unsteady gait  Fall, initial encounter  Abnormal CT of the head  Subtherapeutic international normalized ratio (INR)  Hyperbilirubinemia  History of atrial fibrillation    ED Discharge Orders    [address]None       Rebecah Dangerfield, SewardMercedes, [place] JerseyPA-C 02/16/18 1859    Pricilla LovelessGoldston, Scott, MD 02/16/18 2134

## 2018-02-16 NOTE — ED Notes (Signed)
Out for imaging. °

## 2018-02-16 NOTE — Consult Note (Addendum)
Neurology Consultation  Reason for Consult: Unsteady gait  Referring Physician: Dr. Criss AlvineGoldston  CC: Unsteady gait with tremor  History is obtained from: Patient  HPI: Sean Bishop is a 84 y.o. male with a history of atrial fibrillation on chronic Coumadin, bipolar disorder, degenerative disc disease and history of hypertension and stroke.  Patient has been having difficulty with walking for several months, uses a cane but has been getting around fairly okay.  Today he noticed that he had significant difficulty with his gait and especially his left foot.  He did lose balance and did fall however he was able to catch his balance.  Was brought to the hospital via EMS.  Due to multiple limbs shaking/tremoring and unstable gait neurology was consulted.  After speaking with wife and patient it is noted that patient does have a history of retropulsion, shuffling gait, bradykinesia, difficulty with memory along with the inability to get up out of a seated position without using his arms.  They just did not notice how bad it was until I asked the questions. He states that a left ankle/foot tremor has been ongoing for about 6 months and has gradually worsened.   ROS: A 14 point ROS was performed and is negative except as noted in the HPI.  Past Medical History:  Diagnosis Date  . Atrial fibrillation   . Bipolar 1 disorder   . DJD (degenerative joint disease)   . Hypertension   . Stroke      No family history on file. Unknown at this time  Social History:   reports that he has quit smoking. He has never used smokeless tobacco. He reports that he drinks about 1.0 standard drinks of alcohol per week. He reports that he does not use drugs.  Medications No current facility-administered medications for this encounter.   Current Outpatient Medications:  .  acetaminophen (TYLENOL) 500 MG tablet, Take 500 mg by mouth 2 (two) times daily. , Disp: , Rfl:  .  Ascorbic Acid (VITAMIN C PO), Take 1 tablet  by mouth daily., Disp: , Rfl:  .  CALCIUM-VITAMIN D PO, Take 1 tablet by mouth daily., Disp: , Rfl:  .  Cholecalciferol (VITAMIN D PO), Take 1 tablet by mouth daily., Disp: , Rfl:  .  dorzolamide-timolol (COSOPT) 22.3-6.8 MG/ML ophthalmic solution, Place 1 drop into both eyes every morning., Disp: , Rfl:  .  latanoprost (XALATAN) 0.005 % ophthalmic solution, Place 1 drop into both eyes at bedtime., Disp: , Rfl:  .  lithium carbonate (LITHOBID) 300 MG CR tablet, Take 300 mg by mouth 2 (two) times daily., Disp: , Rfl:  .  Multiple Vitamin (MULTIVITAMIN WITH MINERALS) TABS tablet, Take 1 tablet by mouth daily., Disp: , Rfl:  .  warfarin (COUMADIN) 5 MG tablet, Take 5 mg by mouth 2 (two) times daily. , Disp: , Rfl:    Exam: Current vital signs: BP (!) 160/95   Pulse (!) 136   Temp 98.1 F (36.7 C) (Oral)   Resp 18   SpO2 97%  Vital signs in last 24 hours: Temp:  [98.1 F (36.7 C)] 98.1 F (36.7 C) (08/21 1133) Pulse Rate:  [73-136] 136 (08/21 1633) Resp:  [15-21] 18 (08/21 1633) BP: (125-160)/(90-95) 160/95 (08/21 1633) SpO2:  [97 %-98 %] 97 % (08/21 1633)  GENERAL: Awake, alert in NAD HEENT: - Normocephalic and atraumatic,  Ext: warm, hemosiderin staining in his lower extremities along with edema secondary to peripheral vascular disease  NEURO:  Mental Status:  AA&Ox3, speech is here.  Naming, repetition, fluency, and comprehension intact. Cranial Nerves: PERRL 2 mm/brisk. EOMI, visual fields full, no facial asymmetry, mild hypomania, facial sensation intact, hearing intact, tongue/uvula/soft palate midline,  Motor: 5/5 with increased tone throughout. Significant cogwheel rigidity of bilateral upper extremities, worse on the right. Mild cogwheeling of LLE. It is also noted that he has a resting tremor of his left ankle.  The more he speaks or gets excited the worse the tremor becomes. Moving other limbs also makes the ankle tremor worse. Patient also has action tremor in both legs and  arms.  Patient has cogwheeling bilateral arms along with wrists. Tone: Increased tone throughout Sensation- Decreased sensation lower extremities secondary to peripheral artery disease.  Sensation is not felt until the knee.  Decreased proprioception and also vibration but it is still present. Coordination: No ataxia however there is a significant amount of action tremor.  Gait- deferred Other: A vocal tremor is also noted.    Labs I have reviewed labs in epic and the results pertinent to this consultation are:   CBC    Component Value Date/Time   WBC 10.4 02/16/2018 1441   RBC 5.64 02/16/2018 1441   HGB 17.4 (H) 02/16/2018 1441   HCT 54.8 (H) 02/16/2018 1441   PLT PLATELET CLUMPS NOTED ON SMEAR, UNABLE TO ESTIMATE 02/16/2018 1441   MCV 97.2 02/16/2018 1441   MCH 30.9 02/16/2018 1441   MCHC 31.8 02/16/2018 1441   RDW 13.2 02/16/2018 1441   LYMPHSABS 1.3 02/16/2018 1441   MONOABS 1.0 02/16/2018 1441   EOSABS 0.0 02/16/2018 1441   BASOSABS 0.1 02/16/2018 1441    CMP     Component Value Date/Time   NA 141 02/16/2018 1441   K 4.7 02/16/2018 1441   CL 106 02/16/2018 1441   CO2 27 02/16/2018 1441   GLUCOSE 107 (H) 02/16/2018 1441   BUN 12 02/16/2018 1441   CREATININE 1.38 (H) 02/16/2018 1441   CALCIUM 9.7 02/16/2018 1441   PROT 7.5 02/16/2018 1441   ALBUMIN 3.9 02/16/2018 1441   AST 35 02/16/2018 1441   ALT 23 02/16/2018 1441   ALKPHOS 74 02/16/2018 1441   BILITOT 1.9 (H) 02/16/2018 1441   GFRNONAA 46 (L) 02/16/2018 1441   GFRAA 53 (L) 02/16/2018 1441    Lipid Panel     Component Value Date/Time   CHOL  08/24/2009 0645    171        ATP III CLASSIFICATION:  <200     mg/dL   Desirable  161-096  mg/dL   Borderline High  >=045    mg/dL   High          TRIG 409 08/24/2009 0645   HDL 36 (L) 08/24/2009 0645   CHOLHDL 4.8 08/24/2009 0645   VLDL 24 08/24/2009 0645   LDLCALC (H) 08/24/2009 0645    111        Total Cholesterol/HDL:CHD Risk Coronary Heart Disease  Risk Table                     Men   Women  1/2 Average Risk   3.4   3.3  Average Risk       5.0   4.4  2 X Average Risk   9.6   7.1  3 X Average Risk  23.4   11.0        Use the calculated Patient Ratio above and the CHD Risk Table to determine the patient's CHD  Risk.        ATP III CLASSIFICATION (LDL):  <100     mg/dL   Optimal  [id number]  mg/dL   Near or Above                    Optimal  130-159  mg/dL   Borderline  [id number]  mg/dL   High  >811>190     mg/dL   Very High     Imaging I have reviewed the images obtained:  CT-scan of the brain--no acute intracranial abnormalities   Assessment: Given the history of tremor, retropulsion, hypomania, inability to rise out of a chair without assistance of his arm and shuffling gait, we suspect Parkinson's disease or a Parkinson's plus syndrome.  He does not have a history of any medications that can cause parkinsonism and without MRI at this time I do not see any vascular lesions on CT that would cause his Pparkinsonism. Also on the DDx would be an unusual presentation of severe manifestations of essential tremor.   Recommendations: -Patient will need to be admitted secondary to gait instability.  He is on Coumadin and I would worry if he falls he could have an intracranial bleed or worse. - Consider Sinemet trial while he is in the hospital. - After a trial of IV Ativan, his tremors improved but did not completely subside. This increases the probability of essential tremor being at least a contributing factor.  -- After discharge, he will need outpatient Neurology follow up with a movement disorders specialist.  I have seen and examined the patient. I have amended the assessment and recommendations above.  Electronically signed: Dr. Caryl PinaEric Roddie Riegler

## 2018-02-17 ENCOUNTER — Encounter (HOSPITAL_COMMUNITY): Payer: Self-pay | Admitting: General Practice

## 2018-02-17 ENCOUNTER — Other Ambulatory Visit: Payer: Self-pay

## 2018-02-17 DIAGNOSIS — R2681 Unsteadiness on feet: Secondary | ICD-10-CM

## 2018-02-17 DIAGNOSIS — I482 Chronic atrial fibrillation: Secondary | ICD-10-CM | POA: Diagnosis present

## 2018-02-17 DIAGNOSIS — Z87891 Personal history of nicotine dependence: Secondary | ICD-10-CM | POA: Diagnosis not present

## 2018-02-17 DIAGNOSIS — R251 Tremor, unspecified: Secondary | ICD-10-CM | POA: Diagnosis not present

## 2018-02-17 DIAGNOSIS — W19XXXA Unspecified fall, initial encounter: Secondary | ICD-10-CM

## 2018-02-17 DIAGNOSIS — E669 Obesity, unspecified: Secondary | ICD-10-CM | POA: Diagnosis present

## 2018-02-17 DIAGNOSIS — D751 Secondary polycythemia: Secondary | ICD-10-CM | POA: Diagnosis present

## 2018-02-17 DIAGNOSIS — Z6835 Body mass index (BMI) 35.0-35.9, adult: Secondary | ICD-10-CM | POA: Diagnosis not present

## 2018-02-17 DIAGNOSIS — Z7901 Long term (current) use of anticoagulants: Secondary | ICD-10-CM | POA: Diagnosis not present

## 2018-02-17 DIAGNOSIS — I4892 Unspecified atrial flutter: Secondary | ICD-10-CM | POA: Diagnosis present

## 2018-02-17 DIAGNOSIS — Z79899 Other long term (current) drug therapy: Secondary | ICD-10-CM | POA: Diagnosis not present

## 2018-02-17 DIAGNOSIS — I4891 Unspecified atrial fibrillation: Secondary | ICD-10-CM | POA: Diagnosis present

## 2018-02-17 DIAGNOSIS — G912 (Idiopathic) normal pressure hydrocephalus: Secondary | ICD-10-CM | POA: Diagnosis present

## 2018-02-17 DIAGNOSIS — F319 Bipolar disorder, unspecified: Secondary | ICD-10-CM

## 2018-02-17 DIAGNOSIS — G250 Essential tremor: Secondary | ICD-10-CM | POA: Diagnosis not present

## 2018-02-17 DIAGNOSIS — R930 Abnormal findings on diagnostic imaging of skull and head, not elsewhere classified: Secondary | ICD-10-CM | POA: Diagnosis not present

## 2018-02-17 DIAGNOSIS — N289 Disorder of kidney and ureter, unspecified: Secondary | ICD-10-CM | POA: Diagnosis present

## 2018-02-17 DIAGNOSIS — Z8679 Personal history of other diseases of the circulatory system: Secondary | ICD-10-CM | POA: Diagnosis not present

## 2018-02-17 DIAGNOSIS — I10 Essential (primary) hypertension: Secondary | ICD-10-CM | POA: Diagnosis present

## 2018-02-17 DIAGNOSIS — I480 Paroxysmal atrial fibrillation: Secondary | ICD-10-CM | POA: Diagnosis not present

## 2018-02-17 DIAGNOSIS — I340 Nonrheumatic mitral (valve) insufficiency: Secondary | ICD-10-CM | POA: Diagnosis not present

## 2018-02-17 DIAGNOSIS — G20 Parkinson's disease: Secondary | ICD-10-CM | POA: Diagnosis not present

## 2018-02-17 DIAGNOSIS — Z888 Allergy status to other drugs, medicaments and biological substances status: Secondary | ICD-10-CM | POA: Diagnosis not present

## 2018-02-17 DIAGNOSIS — Z9181 History of falling: Secondary | ICD-10-CM | POA: Diagnosis not present

## 2018-02-17 DIAGNOSIS — H409 Unspecified glaucoma: Secondary | ICD-10-CM | POA: Diagnosis present

## 2018-02-17 DIAGNOSIS — Z8673 Personal history of transient ischemic attack (TIA), and cerebral infarction without residual deficits: Secondary | ICD-10-CM | POA: Diagnosis not present

## 2018-02-17 HISTORY — DX: Bipolar disorder, unspecified: F31.9

## 2018-02-17 LAB — COMPREHENSIVE METABOLIC PANEL
ALBUMIN: 3.6 g/dL (ref 3.5–5.0)
ALT: 21 U/L (ref 0–44)
AST: 33 U/L (ref 15–41)
Alkaline Phosphatase: 67 U/L (ref 38–126)
Anion gap: 11 (ref 5–15)
BILIRUBIN TOTAL: 1.9 mg/dL — AB (ref 0.3–1.2)
BUN: 12 mg/dL (ref 8–23)
CO2: 19 mmol/L — ABNORMAL LOW (ref 22–32)
Calcium: 9.2 mg/dL (ref 8.9–10.3)
Chloride: 110 mmol/L (ref 98–111)
Creatinine, Ser: 1.07 mg/dL (ref 0.61–1.24)
GFR calc Af Amer: >60 mL/min (ref >60)
GFR calc non Af Amer: >60 mL/min (ref >60)
GLUCOSE: 93 mg/dL (ref 70–99)
POTASSIUM: 4.5 mmol/L (ref 3.5–5.1)
Sodium: 140 mmol/L (ref 135–145)
TOTAL PROTEIN: 7.2 g/dL (ref 6.5–8.1)

## 2018-02-17 LAB — CBC
HEMATOCRIT: 52.1 % — AB (ref 39.0–52.0)
Hemoglobin: 16.5 g/dL (ref 13.0–17.0)
MCH: 30.6 pg (ref 26.0–34.0)
MCHC: 31.7 g/dL (ref 30.0–36.0)
MCV: 96.7 fL (ref 78.0–100.0)
Platelets: 194 10*3/uL (ref 150–400)
RBC: 5.39 MIL/uL (ref 4.22–5.81)
RDW: 13.2 % (ref 11.5–15.5)
WBC: 8.3 10*3/uL (ref 4.0–10.5)

## 2018-02-17 LAB — TROPONIN I
Troponin I: <0.03 ng/mL (ref <0.03)
Troponin I: <0.03 ng/mL (ref <0.03)

## 2018-02-17 LAB — PROTIME-INR
INR: 1.9
Prothrombin Time: 21.7 seconds — ABNORMAL HIGH (ref 11.4–15.2)

## 2018-02-17 LAB — TSH: TSH: 2.105 u[IU]/mL (ref 0.350–4.500)

## 2018-02-17 LAB — LITHIUM LEVEL: Lithium Lvl: 0.38 mmol/L — ABNORMAL LOW (ref 0.60–1.20)

## 2018-02-17 MED ORDER — WARFARIN SODIUM 5 MG PO TABS
5.0000 mg | ORAL_TABLET | Freq: Once | ORAL | Status: AC
  Administered 2018-02-17: 5 mg via ORAL
  Filled 2018-02-17 (×2): qty 1

## 2018-02-17 MED ORDER — CARBIDOPA-LEVODOPA 10-100 MG PO TABS
1.0000 | ORAL_TABLET | Freq: Three times a day (TID) | ORAL | Status: DC
  Administered 2018-02-17 – 2018-02-19 (×6): 1 via ORAL
  Filled 2018-02-17 (×7): qty 1

## 2018-02-17 MED ORDER — LITHIUM CARBONATE ER 300 MG PO TBCR
300.0000 mg | EXTENDED_RELEASE_TABLET | Freq: Two times a day (BID) | ORAL | Status: DC
  Administered 2018-02-17 – 2018-02-19 (×6): 300 mg via ORAL
  Filled 2018-02-17 (×6): qty 1

## 2018-02-17 MED ORDER — DORZOLAMIDE HCL-TIMOLOL MAL 2-0.5 % OP SOLN
1.0000 [drp] | Freq: Every morning | OPHTHALMIC | Status: DC
  Administered 2018-02-17 – 2018-02-19 (×3): 1 [drp] via OPHTHALMIC
  Filled 2018-02-17: qty 10

## 2018-02-17 MED ORDER — LATANOPROST 0.005 % OP SOLN
1.0000 [drp] | Freq: Every day | OPHTHALMIC | Status: DC
  Administered 2018-02-17 – 2018-02-18 (×3): 1 [drp] via OPHTHALMIC
  Filled 2018-02-17: qty 2.5

## 2018-02-17 NOTE — Progress Notes (Addendum)
PROGRESS NOTE  Sean Bishop ZOX:[id number] DOB: 1932-11-22 DOA: 02/16/2018 PCP: Juluis Rainier, MD  HPI/Brief Narrative  Sean Bishop is a 84 y.o. year old male with medical history significant for bipolar disorder, atrial for ablation on chronic Coumadin, degenerative disc disease, HTN and stroke history who presented on 02/16/2018 with increased weakness and tremors in lower legs.  He noticed his left foot was shaking terribly while stepping off a curb and felt like he couldn't control it. The same occurred with his left foot. He was unable to balance himself and fell on his right side on the sidewalk. He denies any head trauma or LOC. Denies any chest pain, palpitations, changes in vision prior to the fall.   Neurology was consulted in ED due to progressive unsteady gait for several months prior to this presenting fall  Subjective No acute complaints.  Denies any chest pain.  Assessment/Plan:  1. AF with RVR,currently rate controlled.  Tolerating Dilt gtt for now.  Currently on no rhythm or heart rate agent.  Will discuss with cardiology (he is followed by Dr. Viann Fish).  Coumadin per pharmacy protocol  2. Unsteady gait with tremor.  Progressive for months. Neurology consulted. Sinemet trial. Possible essential tremor contributing too per neuro since some response with IV ativan but also worried about Parkinson's syndrome.  MRI brain shows no acute intracranial process, moderate brain volume loss with superimposed findings of normal pressure hydrocephaly and chronic changes ischemic disease.  Definite fall risk and on coumadin. 3.  4. Bipolar disorder, stable.  Continue home lithium  Code Status: Full code  Family Communication: No family at bedside  Disposition Plan: Discussed with cardiologist, Sinemet trial, neurology recommendations   Consultants:  Cardiology, neurology     Procedures:  None  Antimicrobials: Anti-infectives (From admission, onward)   None         Cultures:  None  Telemetry: Yes  DVT prophylaxis: Coumadin   Objective: Vitals:   02/17/18 0445 02/17/18 0500 02/17/18 0600 02/17/18 0700  BP:  (!) 144/75 120/82 136/88  Pulse: 87 74 (!) 54 81  Resp:  12 12 17   Temp:      TempSrc:      SpO2: 99% 97% 96% 99%    Intake/Output Summary (Last 24 hours) at 02/17/2018 0828 Last data filed at 02/17/2018 0405 Gross per 24 hour  Intake 1032.93 ml  Output -  Net 1032.93 ml   There were no vitals filed for this visit.  Exam:  Constitutional: Elderly male, no distress Eyes: EOMI, anicteric, normal conjunctivae ENMT: Oropharynx with moist mucous membranes, normal dentition Cardiovascular: Irregularly irregular, normal rate, no MRGs, with no peripheral edema Respiratory: Normal respiratory effort, clear breath sounds  Abdomen: Soft,non-tender,  Skin: Erythematous area and scaling in right lower calf Neurologic: Increased tone/increased cogwheel rigidity, resting tremor, worse with intention, able to follow commands appropriately Psychiatric:Appropriate affect, and mood. Mental status AAOx3  Data Reviewed: CBC: Recent Labs  Lab 02/16/18 1441 02/17/18 0129  WBC 10.4 8.3  NEUTROABS 8.1*  --   HGB 17.4* 16.5  HCT 54.8* 52.1*  MCV 97.2 96.7  PLT PLATELET CLUMPS NOTED ON SMEAR, UNABLE TO ESTIMATE 194   Basic Metabolic Panel: Recent Labs  Lab 02/16/18 1441 02/17/18 0129  NA 141 140  K 4.7 4.5  CL 106 110  CO2 27 19*  GLUCOSE 107* 93  BUN 12 12  CREATININE 1.38* 1.07  CALCIUM 9.7 9.2   GFR: CrCl cannot be calculated (Unknown ideal weight.). Liver  Function Tests: Recent Labs  Lab 02/16/18 1441 02/17/18 0129  AST 35 33  ALT 23 21  ALKPHOS 74 67  BILITOT 1.9* 1.9*  PROT 7.5 7.2  ALBUMIN 3.9 3.6   No results for input(s): LIPASE, AMYLASE in the last 168 hours. No results for input(s): AMMONIA in the last 168 hours. Coagulation Profile: Recent Labs  Lab 02/16/18 1536 02/17/18 0617  INR 1.57  1.90   Cardiac Enzymes: Recent Labs  Lab 02/16/18 2256 02/17/18 0035 02/17/18 0617  TROPONINI <0.03 <0.03 <0.03   BNP (last 3 results) No results for input(s): PROBNP in the last 8760 hours. HbA1C: No results for input(s): HGBA1C in the last 72 hours. CBG: No results for input(s): GLUCAP in the last 168 hours. Lipid Profile: No results for input(s): CHOL, HDL, LDLCALC, TRIG, CHOLHDL, LDLDIRECT in the last 72 hours. Thyroid Function Tests: Recent Labs    02/16/18 1929  TSH 2.105   Anemia Panel: No results for input(s): VITAMINB12, FOLATE, FERRITIN, TIBC, IRON, RETICCTPCT in the last 72 hours. Urine analysis:    Component Value Date/Time   COLORURINE YELLOW 02/16/2018 1448   APPEARANCEUR CLEAR 02/16/2018 1448   LABSPEC 1.005 02/16/2018 1448   PHURINE 7.0 02/16/2018 1448   GLUCOSEU NEGATIVE 02/16/2018 1448   HGBUR SMALL (A) 02/16/2018 1448   BILIRUBINUR NEGATIVE 02/16/2018 1448   KETONESUR NEGATIVE 02/16/2018 1448   PROTEINUR NEGATIVE 02/16/2018 1448   UROBILINOGEN 1.0 01/21/2009 0005   NITRITE NEGATIVE 02/16/2018 1448   LEUKOCYTESUR NEGATIVE 02/16/2018 1448   Sepsis Labs: @LABRCNTIP (procalcitonin:4,lacticidven:4)  )No results found for this or any previous visit (from the past 240 hour(s)).    Studies: Ct Head Wo Contrast  Result Date: 02/16/2018 CLINICAL DATA:  Dizziness. EXAM: CT HEAD WITHOUT CONTRAST TECHNIQUE: Contiguous axial images were obtained from the base of the skull through the vertex without intravenous contrast. COMPARISON:  CT scan of August 23, 2009. FINDINGS: Brain: Mild chronic ischemic white matter disease is noted. Mild ventricular dilatation is noted. No midline shift or mass effect is noted. There is no evidence of hemorrhage, mass lesion or acute infarction. Vascular: No hyperdense vessel or unexpected calcification. Skull: Normal. Negative for fracture or focal lesion. Sinuses/Orbits: No acute finding. Other: None. IMPRESSION: Mild chronic  ischemic white matter disease is noted. Mild ventricular dilatation is noted; this is concerning for possible normal pressure hydrocephalus. Clinical correlation is recommended. No other abnormality is noted. Electronically Signed   By: Lupita RaiderJames  Green Jr, M.D.   On: 02/16/2018 16:04   Mr Brain Wo Contrast  Result Date: 02/16/2018 CLINICAL DATA:  Worsening hand LEFT leg tremor for 4 years. History of atrial fibrillation, hypertension and stroke. EXAM: MRI HEAD WITHOUT CONTRAST TECHNIQUE: Multiplanar, multiecho pulse sequences of the brain and surrounding structures were obtained without intravenous contrast. COMPARISON:  CT HEAD February 16, 2018 and MRI of the head September 26, 2009 FINDINGS: Multiple sequences are moderately motion degraded. INTRACRANIAL CONTENTS: No reduced diffusion to suggest acute ischemia. Susceptibility artifact LEFT frontal lobe versus motion artifact. Moderate to severe ventriculomegaly with disproportionate sulcal effacement at the convexities and narrowed callosum angle. Confluent supratentorial white matter FLAIR T2 hyperintensities. Old LEFT medial thalamus lacunar infarct. No midline shift, mass effect or masses. No abnormal extra-axial fluid collections. VASCULAR: Normal major intracranial vascular flow voids present at skull base. SKULL AND UPPER CERVICAL SPINE: No abnormal sellar expansion; pituitary cyst reported on prior examination obscured by motion. No suspicious calvarial bone marrow signal. Craniocervical junction maintained. SINUSES/ORBITS: Trace paranasal sinus mucosal thickening  without air-fluid levels. Minimal RIGHT mastoid effusion. Included ocular globes and orbital contents are non-suspicious. Status post bilateral ocular lens implants. OTHER: None. IMPRESSION: 1. Moderately motion degraded examination. No acute intracranial process. 2. Moderate to severe parenchymal brain volume loss with superimposed findings of normal pressure hydrocephalus. 3. Moderate to severe  chronic small vessel ischemic changes. 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 Electronically Signed   By: Awilda Metro M.D.   On: 02/16/2018 22:09    Scheduled Meds: . clonazePAM  0.5 mg Oral BID  . dorzolamide-timolol  1 drop Both Eyes q morning - 10a  . latanoprost  1 drop Both Eyes QHS  . lithium carbonate  300 mg Oral BID  . Warfarin - Pharmacist Dosing Inpatient   Does not apply q1800    Continuous Infusions: . diltiazem (CARDIZEM) infusion Stopped (02/17/18 0405)     LOS: 0 days     Laverna Peace, MD [facility] Pager [phone]  If 7PM-7AM, please contact night-coverage [email or web address] Password [facility] 02/17/2018, 8:28 AM

## 2018-02-17 NOTE — ED Notes (Signed)
Patient request to speak to Dr. Demetria PoreNettey/paged Dr @ 1410-paged by Marylene LandAngela

## 2018-02-17 NOTE — Evaluation (Signed)
Physical Therapy Evaluation Patient Details Name: Sean Bishop MRN: [id number] DOB: 05/02/1933 Today's Date: 02/17/2018   History of Present Illness  84 y.o. male admitted after fall from sidewalk and several month worsening of BLE tremors. Neurology consulted recommending OP follow up for concern of Parkinson's disease/syndrome.  PMH includes but not limited to stroke, HTN, A Fib a chronic coumadin , HTN, DJD, shoulder surgery.     Clinical Impression  Pt admitted with above diagnosis. Pt currently with functional limitations due to the deficits listed below (see PT Problem List). PTA, pt living at independent living facility with wife, ambulating with a walking stick, hx of 2x falls in last 6 months. Upon eval pt presents with strength, coordination, and balance deficits. Unsafe to ambulate without AD, pt instructed for proper use of RW.  HRmax 95 this session, ambulated 110' with min guard. Rec HHPT for continued therapy after d/c.  Pt will benefit from skilled PT to increase their independence and safety with mobility to allow discharge to the venue listed below.       Follow Up Recommendations Home health PT;Supervision for mobility/OOB    Equipment Recommendations  Rolling walker with 5" wheels    Recommendations for Other Services       Precautions / Restrictions Precautions Precautions: Fall      Mobility  Bed Mobility Overal bed mobility: Needs Assistance Bed Mobility: Supine to Sit     Supine to sit: Min assist     General bed mobility comments: min A at times to assist patient up from ED bed.  Transfers Overall transfer level: Needs assistance Equipment used: Rolling walker (2 wheeled) Transfers: Sit to/from Stand Sit to Stand: Min assist         General transfer comment: min guard for standing for safety, pt requires more assistance without RW, cues for hand placement   Ambulation/Gait Ambulation/Gait assistance: Min guard;Supervision Gait Distance  (Feet): 110 Feet Assistive device: Rolling walker (2 wheeled) Gait Pattern/deviations: Step-to pattern Gait velocity: decreased   General Gait Details: pt with narrow base of support and short step length, walking with RW with supervision level, unable to safely dual task while walking.   Stairs            Wheelchair Mobility    Modified Rankin (Stroke Patients Only)       Balance Overall balance assessment: Needs assistance Sitting-balance support: Single extremity supported Sitting balance-Leahy Scale: Fair     Standing balance support: No upper extremity supported Standing balance-Leahy Scale: Poor                               Pertinent Vitals/Pain Pain Assessment: No/denies pain    Home Living Family/patient expects to be discharged to:: Assisted living Living Arrangements: Spouse/significant other Available Help at Discharge: Family;Available 24 hours/day Type of Home: Apartment Home Access: Level entry;Elevator     Home Layout: One level Home Equipment: None(uses "a stick")      Prior Function Level of Independence: Independent         Comments: driving, community ambulator with stick     Hand Dominance        Extremity/Trunk Assessment   Upper Extremity Assessment Upper Extremity Assessment: Generalized weakness    Lower Extremity Assessment Lower Extremity Assessment: Generalized weakness    Cervical / Trunk Assessment Cervical / Trunk Assessment: Normal  Communication   Communication: No difficulties  Cognition Arousal/Alertness: Awake/alert Behavior During  Therapy: WFL for tasks assessed/performed Overall Cognitive Status: Within Functional Limits for tasks assessed                                 General Comments: Tangental and poor perfomance with dual tasking      General Comments      Exercises     Assessment/Plan    PT Assessment Patient needs continued PT services  PT Problem List  Decreased strength;Decreased balance;Decreased knowledge of use of DME;Decreased safety awareness;Decreased coordination;Decreased mobility       PT Treatment Interventions DME instruction;Gait training;Stair training;Functional mobility training;Therapeutic exercise;Therapeutic activities;Balance training    PT Goals (Current goals can be found in the Care Plan section)  Acute Rehab PT Goals Patient Stated Goal: go home with HHPT when ready PT Goal Formulation: With patient Time For Goal Achievement: 02/24/18 Potential to Achieve Goals: Good    Frequency Min 3X/week   Barriers to discharge        Co-evaluation               AM-PAC PT "6 Clicks" Daily Activity  Outcome Measure Difficulty turning over in bed (including adjusting bedclothes, sheets and blankets)?: Unable Difficulty moving from lying on back to sitting on the side of the bed? : Unable Difficulty sitting down on and standing up from a chair with arms (e.g., wheelchair, bedside commode, etc,.)?: Unable Help needed moving to and from a bed to chair (including a wheelchair)?: A Little Help needed walking in hospital room?: A Little Help needed climbing 3-5 steps with a railing? : A Little 6 Click Score: 12    End of Session Equipment Utilized During Treatment: Gait belt Activity Tolerance: Patient tolerated treatment well Patient left: in bed;with call bell/phone within reach Nurse Communication: Mobility status PT Visit Diagnosis: Unsteadiness on feet (R26.81);History of falling (Z91.81);Muscle weakness (generalized) (M62.81);Difficulty in walking, not elsewhere classified (R26.2)    Time: 1050-1140 PT Time Calculation (min) (ACUTE ONLY): 50 min   Charges:   PT Evaluation $PT Eval Moderate Complexity: 1 Mod PT Treatments $Gait Training: 8-22 mins       Etta Grandchild, PT, DPT Acute Rehab Services Pager: [phone]    Etta Grandchild 02/17/2018, 2:04 PM

## 2018-02-17 NOTE — ED Notes (Signed)
SWAT transporting

## 2018-02-17 NOTE — Progress Notes (Signed)
ANTICOAGULATION CONSULT NOTE - Initial Consult  Pharmacy Consult for warfarin Indication: atrial fibrillation  Allergies  Allergen Reactions  . Lipitor [Atorvastatin] Other (See Comments)    Joint soreness    Patient Measurements:  Vital Signs: BP: 131/85 (08/22 1003) Pulse Rate: 75 (08/22 1003)  Labs: Recent Labs    02/16/18 1441 02/16/18 1536 02/16/18 2256 02/17/18 0035 02/17/18 0129 02/17/18 0617  HGB 17.4*  --   --   --  16.5  --   HCT 54.8*  --   --   --  52.1*  --   PLT PLATELET CLUMPS NOTED ON SMEAR, UNABLE TO ESTIMATE  --   --   --  194  --   LABPROT  --  18.6*  --   --   --  21.7*  INR  --  1.57  --   --   --  1.90  CREATININE 1.38*  --   --   --  1.07  --   TROPONINI  --   --  <0.03 <0.03  --  <0.03    CrCl cannot be calculated (Unknown ideal weight.).   Medical History: Past Medical History:  Diagnosis Date  . Atrial fibrillation (HCC)   . Bipolar 1 disorder (HCC)   . DJD (degenerative joint disease)   . Hypertension   . Mitral regurgitation    mild   . Stroke [facility](HCC)     Assessment: Sean Bishop is an 84 y.o. Male with hx of atrial fibrillation on warfarin. PTA warfarin regimen per patient and wife is 5mg  BID (last dose taken 8/20 PTA). He seems confused in regards to his warfarin dosing and does not think that he regularly has his INR checked. His CHADSVASc = 5. On admission, subtherapeutic with INR 1.57. Spoke with Dr. Selena BattenKim, we will not bridge this patient.   INR up to 1.9 today after 5mg  dose last night. CBC stable, no s/sx of bleeding documented.   Goal of Therapy:  INR 2-3 Monitor platelets by anticoagulation protocol: Yes   Plan:  Warfarin 5mg  x 1 again tonight - may need to reduce dose tomorrow Monitor daily INR, CBC, s/sx of bleeding.  Babs BertinHaley Traniya Prichett, PharmD, BCPS Clinical Pharmacist Clinical phone [phone] Please check AMION for all [facility] contact numbers 02/17/2018 10:58 AM

## 2018-02-17 NOTE — Consult Note (Signed)
Cardiology Consult Note  Admit date: 02/16/2018 Name: Sean Bishop 84 y.o.  male DOB:  August 28, 1932 MRN:  [id number]  Today's date:  02/17/2018  Referring Physician:   [facility]  Primary Physician:    Dr. Juluis Rainier  Reason for Consultation:    Rapid atrial fibrillation  IMPRESSIONS: 1.  Chronic atrial fibrillation in the past has been well-controlled without symptoms without the need for rate control 2.  History of bipolar disease 3.  Hypertension 4.  History of thalamic stroke 5.  Obesity 6.  Degenerative joint disease 7.  Tremor  RECOMMENDATION: Previously the patient has been rate controlled and does not currently have any cardiovascular symptoms.  His rate with somewhat increased on admission.  It might be worthwhile trying a low dose of a rate control agent such as a low dose of a beta blocker or diltiazem but would have a low threshold for discontinuing this if he becomes bradycardic.  Over the years since I have been seeing them he has really not been that fast.  The only other recommendation since she was subtherapeutic on admission would be to consider changing to Eliquis for anticoagulation.  Cost has previously been an issue in regards to this.  HISTORY: This very nice 84 year old male was seen at the request of the hospitalists because of rapid atrial fibrillation.patient has a history of paroxysmal atrial fibrillation which became permanent several years ago.  He was hospitalized with a thalamic stroke a few years ago and has been on warfarin since then.  He has recently moved to friend's home and gets along fairly well there.  He has had some difficulty with depression, bipolar disorder and has had a difficult gait for some time.  He noted progressive difficulty with his gait and his left foot and fell earlier today and was brought to the hospital by EMS because of very unsteady gait and shaking.  He was noted to be in fairly rapid atrial fibrillation was  placed on IV diltiazem and cardiology was consulted today.  Evidently there was some concern about his atrial fibrillation rate as well as falling.  The patient was asymptomatic however and denied shortness of breath chest pain PND or orthopnea or edema.  Past Medical History:  Diagnosis Date  . Atrial fibrillation (HCC)   . Bipolar 1 disorder (HCC)   . DJD (degenerative joint disease)   . Hypertension   . Mitral regurgitation    mild   . Renal insufficiency   . Stroke (HCC)   . Tremors of nervous system 01/2018     Past Surgical History:  Procedure Laterality Date  . SHOULDER SURGERY      Allergies:  is allergic to lipitor [atorvastatin].   Medications: Prior to Admission medications   Medication Sig Start Date End Date Taking? Authorizing Provider  acetaminophen (TYLENOL) 500 MG tablet Take 500 mg by mouth 2 (two) times daily.    Yes [provider]  Ascorbic Acid (VITAMIN C PO) Take 1 tablet by mouth daily.   Yes [provider]  CALCIUM-VITAMIN D PO Take 1 tablet by mouth daily.   Yes [provider]  Cholecalciferol (VITAMIN D PO) Take 1 tablet by mouth daily.   Yes [provider]  dorzolamide-timolol (COSOPT) 22.3-6.8 MG/ML ophthalmic solution Place 1 drop into both eyes every morning. 08/19/13  Yes [provider]  latanoprost (XALATAN) 0.005 % ophthalmic solution Place 1 drop into both eyes at bedtime. 08/19/13  Yes [provider]  lithium  carbonate (LITHOBID) 300 MG CR tablet Take 300 mg by mouth 2 (two) times daily. 08/28/13  Yes [provider]  Multiple Vitamin (MULTIVITAMIN WITH MINERALS) TABS tablet Take 1 tablet by mouth daily.   Yes [provider]  warfarin (COUMADIN) 5 MG tablet Take 5 mg by mouth 2 (two) times daily.  08/23/13  Yes [provider]   Family History: Family Status  Relation Name Status  . Mother  Deceased  . Father  Deceased    Social History:   reports that he  has quit smoking. He has never used smokeless tobacco. He reports that he drinks about 1.0 standard drinks of alcohol per week. He reports that he does not use drugs.   Review of Systems: Has been obese for several years.  He has had a very unsteady gait.  He has not had any previous GI bleeding.  No significant shortness of breath.  Does have some difficulty with memory at times and has also complained of some recent swelling of his legs as well as weakness when trying to get up and down out of a seated to a chair without using his arms.  Other than as noted above the remainder of the review of systems is unremarkable.  Physical Exam: BP (!) 141/86 (BP Location: Left Arm)   Pulse 82   Temp 98.6 F (37 C) (Oral)   Resp (!) 22   Ht 5\' 6"  (1.676 m)   Wt 99.8 kg   SpO2 98%   BMI 35.51 kg/m   General appearance: he is an obese elderly male lying in bed currently in no acute distress Head: Normocephalic, without obvious abnormality, atraumatic, balding male hair pattern Eyes: conjunctivae/corneas clear. PERRL, EOM's intact. Fundi benign. Neck: no adenopathy, no carotid bruit, no JVD and supple, symmetrical, trachea midline Lungs: clear to auscultation bilaterally Heart: regularly irregular rhythm, normal S1 and S2, no S3 Abdomen: soft, non-tender; bowel sounds normal; no masses,  no organomegaly Rectal: deferred Extremities: trace edema, mild venous insufficiency changes noted, normal range of motion. Pulses: 2+ and symmetric Skin: Skin color, texture, turgor normal. No rashes or lesions Neurologic: Grossly normal Psych: Alert and oriented x 3 Labs: CBC Recent Labs    02/16/18 1441 02/17/18 0129  WBC 10.4 8.3  RBC 5.64 5.39  HGB 17.4* 16.5  HCT 54.8* 52.1*  PLT PLATELET CLUMPS NOTED ON SMEAR, UNABLE TO ESTIMATE 194  MCV 97.2 96.7  MCH 30.9 30.6  MCHC 31.8 31.7  RDW 13.2 13.2  LYMPHSABS 1.3  --   MONOABS 1.0  --   EOSABS 0.0  --   BASOSABS 0.1  --    CMP  Recent Labs     02/17/18 0129  NA 140  K 4.5  CL 110  CO2 19*  GLUCOSE 93  BUN 12  CREATININE 1.07  CALCIUM 9.2  PROT 7.2  ALBUMIN 3.6  AST 33  ALT 21  ALKPHOS 67  BILITOT 1.9*  GFRNONAA >60  GFRAA >60   Cardiac Panel (last 3 results) Troponin (Point of Care Test) Recent Labs    02/16/18 1447  TROPIPOC 0.01   Cardiac Panel (last 3 results) Recent Labs    02/17/18 0035 02/17/18 0617 02/17/18 1300  TROPONINI <0.03 <0.03 <0.03    EKG: atrial fibrillation with mildly rapid response of 117 otherwise unremarkable. Independently reviewed by me  Signed:  W. Ashley RoyaltySpencer Tilley, Jr. MD [facility]   Cardiology Consultant  02/17/2018, 10:54 PM

## 2018-02-18 ENCOUNTER — Other Ambulatory Visit (HOSPITAL_COMMUNITY): Payer: Medicare Other

## 2018-02-18 DIAGNOSIS — Z8679 Personal history of other diseases of the circulatory system: Secondary | ICD-10-CM

## 2018-02-18 LAB — MRSA PCR SCREENING: MRSA by PCR: NEGATIVE

## 2018-02-18 LAB — PROTIME-INR
INR: 2
Prothrombin Time: 22.5 seconds — ABNORMAL HIGH (ref 11.4–15.2)

## 2018-02-18 MED ORDER — WARFARIN SODIUM 2.5 MG PO TABS: 2.5000 mg | ORAL_TABLET | Freq: Once | ORAL | Status: DC

## 2018-02-18 MED ORDER — DILTIAZEM HCL ER COATED BEADS 120 MG PO CP24
120.0000 mg | ORAL_CAPSULE | Freq: Every day | ORAL | Status: DC
  Administered 2018-02-18 – 2018-02-19 (×2): 120 mg via ORAL
  Filled 2018-02-18 (×2): qty 1

## 2018-02-18 NOTE — Progress Notes (Addendum)
NEURO HOSPITALIST PROGRESS NOTE   Subjective: Patient awake, alert, oriented to person/place/situation. States that he feels much better than he did yesterday and that his tremors have improved to the extent that his gait is now more stable. Of note, tremor episode yesterday was severe enough that it caused him to fall.   Exam: Vitals:   02/17/18 1457 02/18/18 0502  BP: (!) 141/86 140/77  Pulse:  66  Resp:    Temp:  97.7 F (36.5 C)  SpO2:      Physical Exam   HEENT-  Normocephalic, no lesions, without obvious abnormality.  Normal external eye and conjunctiva.   Cardiovascular- S1-S2 audible, pulses palpable throughout   Lungs-no rhonchi or wheezing noted, no excessive working breathing.  Saturations within normal limits on RA Extremities- Warm, dry and intact Musculoskeletal-no joint tenderness, deformity or swelling Skin-warm and dry,BLE venous insufficiency and discoloration noted from ankle to top of calf.   Neuro:  Mental Status: Alert, oriented, thought content appropriate.  Speech fluent without evidence of aphasia.  Able to follow commands without difficulty. NEURO: . Cranial Nerves: PERRL 2 mm/brisk. EOMI, visual fields full, no facial asymmetry,   facial sensation intact, hearing intact, tongue/uvula/soft palate midline,  Motor: 5/5 with increased tone throughout.  It is also noted that he has a resting tremor of his left ankle.  The more he speaks or gets excited the worse the tremor becomes. Moving other limbs also makes the ankle tremor worse. Patient also has action tremor in both legs and arms.  Patient has cogwheeling of bilateral arms along with wrists. Tone: Increased tone throughout Sensation- Decreased sensation lower extremities secondary to peripheral artery disease.  Sensation is not felt until the knee.  Decreased proprioception and also vibration but it is still present. Reflexes: +3 right knee jerk, 2+ left knee jerk , + 2  BUE Coordination: No ataxia however there is a significant amount of action tremor.  Gait- deferred Other: A vocal tremor is also noted.    Medications:  Scheduled: . carbidopa-levodopa  1 tablet Oral TID  . clonazePAM  0.5 mg Oral BID  . diltiazem  120 mg Oral Daily  . dorzolamide-timolol  1 drop Both Eyes q morning - 10a  . latanoprost  1 drop Both Eyes QHS  . lithium carbonate  300 mg Oral BID  . Warfarin - Pharmacist Dosing Inpatient   Does not apply q1800   Continuous:  ZOX:[id number]:acetaminophen **OR** acetaminophen  Pertinent Labs/Diagnostics:   Ct Head Wo Contrast  Result Date: 02/16/2018 CLINICAL DATA:  Dizziness. EXAM: CT HEAD WITHOUT CONTRAST TECHNIQUE: Contiguous axial images were obtained from the base of the skull through the vertex without intravenous contrast. COMPARISON:  CT scan of August 23, 2009. FINDINGS: Brain: Mild chronic ischemic white matter disease is noted. Mild ventricular dilatation is noted. No midline shift or mass effect is noted. There is no evidence of hemorrhage, mass lesion or acute infarction. Vascular: No hyperdense vessel or unexpected calcification. Skull: Normal. Negative for fracture or focal lesion. Sinuses/Orbits: No acute finding. Other: None. IMPRESSION: Mild chronic ischemic white matter disease is noted. Mild ventricular dilatation is noted; this is concerning for possible normal pressure hydrocephalus. Clinical correlation is recommended. No other abnormality is noted. Electronically Signed   By: Lupita RaiderJames  Green Jr, M.D.   On: 02/16/2018 16:04   Mr Brain Wo Contrast Result Date: 02/16/2018  IMPRESSION:  1. Moderately motion degraded examination. No acute intracranial process. 2. Moderate to severe parenchymal brain volume loss with superimposed findings of normal pressure hydrocephalus. 3. Moderate to severe chronic small vessel ischemic changes. 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   Assessment:  84 y.o. male with a history of atrial fibrillation  on chronic Coumadin, bipolar disorder, degenerative disc disease and history of hypertension and stroke. Patient has been having difficulty with walking for several months, uses a cane but has been getting around fairly okay. Presented for worsened unsteady gait and tremor.  1. CT-scan of the brain--no acute intracranial abnormalities.  2. MRI brain: moderate to severe parenchymal brain volume loss with superimposed findings of NPH per Radiology report.  3. MRI brain also reveals an old left thalamus lacunar infarct.  4. Given the history of tremor, retropulsion, hypomimia, inability to rise out of a chair without assistance of his arm and shuffling gait, we suspect Parkinson's disease or a Parkinson's plus syndrome.  He does not have a history of any medications that can cause parkinsonism and there are no extensive basal ganglia vascular lesions on MRI to suggest vascular Parkinsonism. Also on the DDx would be an unusual presentation of severe manifestations of essential tremor.  5. 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 On my review of the images, I agree with this assessment and NPH is a signficant differential diagnostic consideration in addition to Parkinson's disease, a Parkinson's plus syndrome and severe essential tremor.  6. Of note, no asterixis is seen on exam.   Recommendations: -PT for gait instability.  He is on Coumadin and I would worry if he falls he could have an intracranial bleed or worse. - Continue Sinemet trial.  - After a trial of IV Ativan, his tremors improved but did not completely subside. This increases the probability of severe manifestations of essential tremor being at least a contributing factor regarding his left ankle tremor with falls. Of note, involvement of the legs in essential tremor is not common.  -- After discharge,  he will need outpatient Neurology follow up with a movement disorders specialist to assess for possible NPH versus Parkinson's disease, a Parkinson's plus syndrome and/or severe essential tremor. Supporting Parkinson's disease as the primary etiology is some degree of symptomatic improvement on a trial of Sinemet this admission. However, improved but not resolved tremors with benzodiazepine administration yesterday also suggest an essential tremor component.   Valentina Lucks, MSN, NP-C [facility] [phone]  I have seen and examined the patient. I have amended the assessment and plan above.   Electronically signed: Dr. Caryl Pina 02/18/2018, 10:04 AM

## 2018-02-18 NOTE — Progress Notes (Addendum)
ANTICOAGULATION CONSULT NOTE Pharmacy Consult for warfarin Indication: atrial fibrillation  Allergies  Allergen Reactions  . Lipitor [Atorvastatin] Other (See Comments)    Joint soreness    Patient Measurements:  Vital Signs: Temp: 97.7 F (36.5 C) (08/23 0502) Temp Source: Oral (08/23 0502) BP: 140/77 (08/23 0502) Pulse Rate: 66 (08/23 0502)  Labs: Recent Labs    02/16/18 1441 02/16/18 1536  02/17/18 0035 02/17/18 0129 02/17/18 0617 02/17/18 1300 02/18/18 0438  HGB 17.4*  --   --   --  16.5  --   --   --   HCT 54.8*  --   --   --  52.1*  --   --   --   PLT PLATELET CLUMPS NOTED ON SMEAR, UNABLE TO ESTIMATE  --   --   --  194  --   --   --   LABPROT  --  18.6*  --   --   --  21.7*  --  22.5*  INR  --  1.57  --   --   --  1.90  --  2.00  CREATININE 1.38*  --   --   --  1.07  --   --   --   TROPONINI  --   --    < > <0.03  --  <0.03 <0.03  --    < > = values in this interval not displayed.    Estimated Creatinine Clearance: 58.7 mL/min (by C-G formula based on SCr of 1.07 mg/dL).   Medical History: Past Medical History:  Diagnosis Date  . Atrial fibrillation (HCC)   . Bipolar 1 disorder (HCC)   . DJD (degenerative joint disease)   . Hypertension   . Mitral regurgitation    mild   . Renal insufficiency   . Stroke (HCC)   . Tremors of nervous system 01/2018    Assessment: Sean Bishop is an 84 y.o. Male with hx of atrial fibrillation on warfarin. PTA warfarin regimen per patient and wife is 5mg  BID (last dose taken 8/20 PTA). He seems confused in regards to his warfarin dosing and does not think that he regularly has his INR checked. His CHADSVASc = 5. On admission, subtherapeutic with INR 1.57. Cardiology recommends looking into cost of apixaban -INR= 2.0 (trend up)  Goal of Therapy:  INR 2-3 Monitor platelets by anticoagulation protocol: Yes   Plan:  -Coumaidn 2.5mg  today -Daily PT/INR -Will see if case management can looks into cost of  apixaban  Sean GermanAndrew Crissie Bishop, PharmD Clinical Pharmacist Please check Amion for pharmacy contact number  Addendum -Plans to change to apixaban -INR 2.0 with trend up   Plan -INR in am -Once INR < 2.0 will transition to apixaban 5mg  po bid  Sean GermanAndrew Armandina Bishop, PharmD Clinical Pharmacist Please check Amion for pharmacy contact number

## 2018-02-18 NOTE — Progress Notes (Signed)
Subjective:  Awake and alert and feeling significantly better today.  No complaints of shortness of breath or chest pain.  Objective:  Vital Signs in the last 24 hours: BP 140/77 (BP Location: Left Arm)   Pulse 66   Temp 97.7 F (36.5 C) (Oral)   Resp (!) 22   Ht 5\' 6"  (1.676 m)   Wt 99.4 kg   SpO2 98%   BMI 35.37 kg/m   Physical Exam: Pleasant elderly male in no acute distress moderately obese Lungs:  Clear Cardiac:  irregular rhythm, normal S1 and S2, no S3 Extremities:  No edema present  Intake/Output from previous day: 08/22 0701 - 08/23 0700 In: 480 [P.O.:480] Out: -   Weight Filed Weights   02/17/18 1455 02/18/18 0502  Weight: 99.8 kg 99.4 kg    Lab Results: Basic Metabolic Panel: Recent Labs    02/16/18 1441 02/17/18 0129  NA 141 140  K 4.7 4.5  CL 106 110  CO2 27 19*  GLUCOSE 107* 93  BUN 12 12  CREATININE 1.38* 1.07   CBC: Recent Labs    02/16/18 1441 02/17/18 0129  WBC 10.4 8.3  NEUTROABS 8.1*  --   HGB 17.4* 16.5  HCT 54.8* 52.1*  MCV 97.2 96.7  PLT PLATELET CLUMPS NOTED ON SMEAR, UNABLE TO ESTIMATE 194   Cardiac Enzymes: Troponin (Point of Care Test) Recent Labs    02/16/18 1447  TROPIPOC 0.01   Cardiac Panel (last 3 results) Recent Labs    02/17/18 0035 02/17/18 0617 02/17/18 1300  TROPONINI <0.03 <0.03 <0.03    Telemetry: Atrial fibrillation controlled response  Assessment/Plan:  1.  Chronic atrial fibrillation currently rate controlled off of intravenous diltiazem.  Previously have not had him on rate control agents as his rate as an outpatient has been under reasonable control.  It is not unreasonable while he is in the hospital to try him on a low-dose of diltiazem to prevent rapid ventricular response would have a low threshold for discontinuing this if he becomes bradycardic. 2.  Long-term use of anticoagulation-some variability with warfarin-I think it is reasonable to look into apixaban as an alternative to  warfarin.  Although he has an unsteady gait I would continue anticoagulation because of his previous stroke 3.  Undefined tremor and gait disorder  Call cardiology as needed.    Sean Bishop, Jr.  MD [facility]  02/18/2018, 9:07 AM

## 2018-02-18 NOTE — Care Management Note (Signed)
Case Management Note  Patient Details  Name: Sean Bishop MRN: [id number] Date of Birth: 29-Oct-1932  Subjective/Objective:  84 y.o.year old malewith medical history significant for bipolar disorder, atrial for ablation on chronic Coumadin, degenerative disc disease, HTN and stroke historywho presented on 8/21/2019with increased weakness and tremors in lower legs. Patient lives at [facility], [place], [place], living with spouse, using a walking stick for mobility. Patient indicated his spouse assists him with dressing tasks. Pharmacy: [facility].                    Action/Plan: CM met with patient to discuss transitional needs and PT/OT recommendations. Preference list for HH/DME provided, with patient requesting to discuss with his son, who's a nurse, prior to making a decision. Patient reports son will provide transport home. Eliquis 30-day free card provided and patient updated of estimated copay cost of $37.00 and verbalized understanding. CM will continue to follow.   Expected Discharge Date:                  Expected Discharge Plan:  Home/Self Care  In-House Referral:  NA  Discharge planning Services  CM Consult  Post Acute Care Choice:  Home Health, Durable Medical Equipment Choice offered to:  Patient  DME Arranged:  N/A DME Agency:  NA  HH Arranged:  PT, OT HH Agency:  NA  Status of Service:  In process, will continue to follow  If discussed at Long Length of Stay Meetings, dates discussed:    Additional Comments:  Midge Minium RN, BSN, NCM-BC, ACM-RN [phone] 02/18/2018, 3:21 PM

## 2018-02-18 NOTE — Discharge Instructions (Addendum)
Information on my medicine - ELIQUIS® (apixaban) ° °Why was Eliquis® prescribed for you? °Eliquis® was prescribed for you to reduce the risk of a blood clot forming that can cause a stroke if you have a medical condition called atrial fibrillation (a type of irregular heartbeat). ° °What do You need to know about Eliquis® ? °Take your Eliquis® TWICE DAILY - one tablet in the morning and one tablet in the evening with or without food. If you have difficulty swallowing the tablet whole please discuss with your pharmacist how to take the medication safely. ° °Take Eliquis® exactly as prescribed by your doctor and DO NOT stop taking Eliquis® without talking to the doctor who prescribed the medication.  Stopping may increase your risk of developing a stroke.  Refill your prescription before you run out. ° °After discharge, you should have regular check-up appointments with your healthcare provider that is prescribing your Eliquis®.  In the future your dose may need to be changed if your kidney function or weight changes by a significant amount or as you get older. ° °What do you do if you miss a dose? °If you miss a dose, take it as soon as you remember on the same day and resume taking twice daily.  Do not take more than one dose of ELIQUIS at the same time to make up a missed dose. ° °Important Safety Information °A possible side effect of Eliquis® is bleeding. You should call your healthcare provider right away if you experience any of the following: °? Bleeding from an injury or your nose that does not stop. °? Unusual colored urine (red or dark brown) or unusual colored stools (red or black). °? Unusual bruising for unknown reasons. °? A serious fall or if you hit your head (even if there is no bleeding). ° °Some medicines may interact with Eliquis® and might increase your risk of bleeding or clotting while on Eliquis®. To help avoid this, consult your healthcare provider or pharmacist prior to using any new  prescription or non-prescription medications, including herbals, vitamins, non-steroidal anti-inflammatory drugs (NSAIDs) and supplements. ° °This website has more information on Eliquis® (apixaban): http://www.eliquis.com/eliquis/home ° ° °Atrial Fibrillation °Atrial fibrillation is a type of heartbeat that is irregular or fast (rapid). If you have this condition, your heart keeps quivering in a weird (chaotic) way. This condition can make it so your heart cannot pump blood normally. Having this condition gives a person more risk for stroke, heart failure, and other heart problems. There are different types of atrial fibrillation. Talk with your doctor to learn about the type that you have. °Follow these instructions at home: °· Take over-the-counter and prescription medicines only as told by your doctor. °· If your doctor prescribed a blood-thinning medicine, take it exactly as told. Taking too much of it can cause bleeding. If you do not take enough of it, you will not have the protection that you need against stroke and other problems. °· Do not use any tobacco products. These include cigarettes, chewing tobacco, and e-cigarettes. If you need help quitting, ask your doctor. °· If you have apnea (obstructive sleep apnea), manage it as told by your doctor. °· Do not drink alcohol. °· Do not drink beverages that have caffeine. These include coffee, soda, and tea. °· Maintain a healthy weight. Do not use diet pills unless your doctor says they are safe for you. Diet pills may make heart problems worse. °· Follow diet instructions as told by your doctor. °· Exercise   regularly as told by your doctor. °· Keep all follow-up visits as told by your doctor. This is important. °Contact a doctor if: °· You notice a change in the speed, rhythm, or strength of your heartbeat. °· You are taking a blood-thinning medicine and you notice more bruising. °· You get tired more easily when you move or exercise. °Get help right away  if: °· You have pain in your chest or your belly (abdomen). °· You have sweating or weakness. °· You feel sick to your stomach (nauseous). °· You notice blood in your throw up (vomit), poop (stool), or pee (urine). °· You are short of breath. °· You suddenly have swollen feet and ankles. °· You feel dizzy. °· Your suddenly get weak or numb in your face, arms, or legs, especially if it happens on one side of your body. °· You have trouble talking, trouble understanding, or both. °· Your face or your eyelid droops on one side. °These symptoms may be an emergency. Do not wait to see if the symptoms will go away. Get medical help right away. Call your local emergency services (911 in the [place] Do not drive yourself to the hospital. °This information is not intended to replace advice given to you by your health care provider. Make sure you discuss any questions you have with your health care provider. °Document Released: 03/24/2008 Document Revised: 11/21/2015 Document Reviewed: 10/10/2014 °Elsevier Interactive Patient Education © 2018 [employer] ° ° ° °Heart-Healthy Eating Plan °Heart-healthy meal planning includes: °· Limiting unhealthy fats. °· Increasing healthy fats. °· Making other small dietary changes. ° °You may need to talk with your doctor or a diet specialist (dietitian) to create an eating plan that is right for you. °What types of fat should I choose? °· Choose healthy fats. These include olive oil and canola oil, flaxseeds, walnuts, almonds, and seeds. °· Eat more omega-3 fats. These include salmon, mackerel, sardines, tuna, flaxseed oil, and ground flaxseeds. Try to eat fish at least twice each week. °· Limit saturated fats. °? Saturated fats are often found in animal products, such as meats, butter, and cream. °? Plant sources of saturated fats include palm oil, palm kernel oil, and coconut oil. °· Avoid foods with partially hydrogenated oils in them. These include stick margarine, some tub  margarines, cookies, crackers, and other baked goods. These contain trans fats. °What general guidelines do I need to follow? °· Check food labels carefully. Identify foods with trans fats or high amounts of saturated fat. °· Fill one half of your plate with vegetables and green salads. Eat 4-5 servings of vegetables per day. A serving of vegetables is: °? 1 cup of raw leafy vegetables. °? ½ cup of raw or cooked cut-up vegetables. °? ½ cup of vegetable juice. °· Fill one fourth of your plate with whole grains. Look for the word "whole" as the first word in the ingredient list. °· Fill one fourth of your plate with lean protein foods. °· Eat 4-5 servings of fruit per day. A serving of fruit is: °? One medium whole fruit. °? ¼ cup of dried fruit. °? ½ cup of fresh, frozen, or canned fruit. °? ½ cup of 100% fruit juice. °· Eat more foods that contain soluble fiber. These include apples, broccoli, carrots, beans, peas, and barley. Try to get 20-30 g of fiber per day. °· Eat more home-cooked food. Eat less restaurant, buffet, and fast food. °· Limit or avoid alcohol. °· Limit foods high in starch and   sugar. °· Avoid fried foods. °· Avoid frying your food. Try baking, boiling, grilling, or broiling it instead. You can also reduce fat by: °? Removing the skin from poultry. °? Removing all visible fats from meats. °? Skimming the fat off of stews, soups, and gravies before serving them. °? Steaming vegetables in water or broth. °· Lose weight if you are overweight. °· Eat 4-5 servings of nuts, legumes, and seeds per week: °? One serving of dried beans or legumes equals ½ cup after being cooked. °? One serving of nuts equals 1½ ounces. °? One serving of seeds equals ½ ounce or one tablespoon. °· You may need to keep track of how much salt or sodium you eat. This is especially true if you have high blood pressure. Talk with your doctor or dietitian to get more information. °What foods can I eat? °Grains °Breads, including  French, white, pita, wheat, raisin, rye, oatmeal, and Italian. Tortillas that are neither fried nor made with lard or trans fat. Low-fat rolls, including hotdog and hamburger buns and English muffins. Biscuits. Muffins. Waffles. Pancakes. Light popcorn. Whole-grain cereals. Flatbread. Melba toast. Pretzels. Breadsticks. Rusks. Low-fat snacks. Low-fat crackers, including oyster, saltine, matzo, graham, animal, and rye. Rice and pasta, including brown rice and pastas that are made with whole wheat. °Vegetables °All vegetables. °Fruits °All fruits, but limit coconut. °Meats and Other Protein Sources °Lean, well-trimmed beef, veal, pork, and lamb. Chicken and turkey without skin. All fish and shellfish. Wild duck, rabbit, pheasant, and venison. Egg whites or low-cholesterol egg substitutes. Dried beans, peas, lentils, and tofu. Seeds and most nuts. °Dairy °Low-fat or nonfat cheeses, including ricotta, string, and mozzarella. Skim or 1% milk that is liquid, powdered, or evaporated. Buttermilk that is made with low-fat milk. Nonfat or low-fat yogurt. °Beverages °Mineral water. Diet carbonated beverages. °Sweets and Desserts °Sherbets and fruit ices. Honey, jam, marmalade, jelly, and syrups. Meringues and gelatins. Pure sugar candy, such as hard candy, jelly beans, gumdrops, mints, marshmallows, and small amounts of dark chocolate. Angel food cake. °Eat all sweets and desserts in moderation. °Fats and Oils °Nonhydrogenated (trans-free) margarines. Vegetable oils, including soybean, sesame, sunflower, olive, peanut, safflower, corn, canola, and cottonseed. Salad dressings or mayonnaise made with a vegetable oil. Limit added fats and oils that you use for cooking, baking, salads, and as spreads. °Other °Cocoa powder. Coffee and tea. All seasonings and condiments. °The items listed above may not be a complete list of recommended foods or beverages. Contact your dietitian for more options. °What foods are not  recommended? °Grains °Breads that are made with saturated or trans fats, oils, or whole milk. Croissants. Butter rolls. Cheese breads. Sweet rolls. Donuts. Buttered popcorn. Chow mein noodles. High-fat crackers, such as cheese or butter crackers. °Meats and Other Protein Sources °Fatty meats, such as hotdogs, short ribs, sausage, spareribs, bacon, rib eye roast or steak, and mutton. High-fat deli meats, such as salami and bologna. Caviar. Domestic duck and goose. Organ meats, such as kidney, liver, sweetbreads, and heart. °Dairy °Cream, sour cream, cream cheese, and creamed cottage cheese. Whole-milk cheeses, including blue (bleu), Monterey Jack, Brie, [place], American, Havarti, Swiss, cheddar, Camembert, and [place]. Whole or 2% milk that is liquid, evaporated, or condensed. Whole buttermilk. Cream sauce or high-fat cheese sauce. Yogurt that is made from whole milk. °Beverages °Regular sodas and juice drinks with added sugar. °Sweets and Desserts °Frosting. Pudding. Cookies. Cakes other than angel food cake. Candy that has milk chocolate or white chocolate, hydrogenated fat, butter, coconut, or   unknown ingredients. Buttered syrups. Full-fat ice cream or ice cream drinks. °Fats and Oils °Gravy that has suet, meat fat, or shortening. Cocoa butter, hydrogenated oils, palm oil, coconut oil, palm kernel oil. These can often be found in baked products, candy, fried foods, nondairy creamers, and whipped toppings. Solid fats and shortenings, including bacon fat, salt pork, lard, and butter. Nondairy cream substitutes, such as coffee creamers and sour cream substitutes. Salad dressings that are made of unknown oils, cheese, or sour cream. °The items listed above may not be a complete list of foods and beverages to avoid. Contact your dietitian for more information. °This information is not intended to replace advice given to you by your health care provider. Make sure you discuss any questions you have with your health care  provider. °Document Released: 12/15/2011 Document Revised: 11/21/2015 Document Reviewed: 12/07/2013 °Elsevier Interactive Patient Education © 2018 Elsevier Inc. ° ° °

## 2018-02-18 NOTE — Care Management (Signed)
02-18-18 BENEFITS CHECK :  # 4.    S/W  SAAHA   @ [facility] #  [id number]  1. ELIQUIS  2.5 MG BID COVER- YES CO-PAY- $ 37.00 TIER- 3 DRUG PRIOR APPROVAL- NO  2. ELIQUIS  5 MG  BID COVER- YES CO-PAY- $ 37.00 TIER- 3 DRUG PRIOR APPROVAL- NO  APIXABAN: NONE FORMULARY  PREFERRED PHARMACY : YES [facility] , [facility] AND [facility]  90 DAY SUPPLY  $ 111.00

## 2018-02-18 NOTE — Progress Notes (Signed)
PROGRESS NOTE  Sean Bishop UEA:[id number] DOB: [birth year] DOA: 02/16/2018 PCP: Juluis Rainier, MD  HPI/Brief Narrative Sean Bishop is a 84 y.o. year old male with medical history significant for bipolar disorder, atrial for ablation on chronic Coumadin, degenerative disc disease, HTN and stroke history who presented on 02/16/2018 with increased weakness and tremors in lower legs.  He noticed his left foot was shaking terribly while stepping off a curb and felt like he couldn't control it. The same occurred with his left foot. He was unable to balance himself and fell on his right side on the sidewalk. He denies any head trauma or LOC. Denies any chest pain, palpitations, changes in vision prior to the fall.   Neurology was consulted in ED due to progressive unsteady gait for several months prior to this presenting fall  Subjective Doing well this morning with no acute complaints.  Assessment/Plan:  Chronic atrial fibrillation, now rate controlled.  Previously required diltiazem drip.  Not on any rate/rhythm control at home, Dr. Viann Fish (primary cardiologist) recommended starting low-dose diltiazem 120 mg qd.  If maintains appropriate heart rate expect discharge on 8/24.  Due to INR variability discontinue warfarin and start Eliquis on 8/20  Unsteady gait with tremor, progressive for months.  Neurology consulted and recommended Sinemet trial.  Did have some response to IV Ativan, suspect some component of essential tremor.  PT evaluated recommends home health PT.  MRI brain shows no acute process.  Did show chronic normal pressure hydrocephalus  Bipolar disorder, stable. Continue home lithium.  Lithium levels normal.  Code Status: FULL CODE   Family Communication: No family at bedside   Disposition Plan: Monitor HR on oral diltiazem , hopeful discharge to ILF with HHPT on 8/24   Consultants:  Cardiology, Neurology  Procedures:  none    Antimicrobials: Anti-infectives (From admission, onward)   None         Cultures:  none  Telemetry:yes  DVT prophylaxis:  Eliquis(switch from warfarin)   Objective: Vitals:   02/17/18 1400 02/17/18 1455 02/17/18 1457 02/18/18 0502  BP: 115/74  (!) 141/86 140/77  Pulse: 69 82  66  Resp: (!) 22     Temp:  98.6 F (37 C)  97.7 F (36.5 C)  TempSrc:  Oral  Oral  SpO2: 96% 98%    Weight:  99.8 kg  99.4 kg  Height:  5\' 6"  (1.676 m)      Intake/Output Summary (Last 24 hours) at 02/18/2018 0949 Last data filed at 02/17/2018 1900 Gross per 24 hour  Intake 240 ml  Output -  Net 240 ml   Filed Weights   02/17/18 1455 02/18/18 0502  Weight: 99.8 kg 99.4 kg    Exam:  Constitutional:normal appearing male Eyes: EOMI, anicteric, normal conjunctivae ENMT: Oropharynx with moist mucous membranes, normal dentition Cardiovascular: Irregularly irregular, normal rate no MRGs, with no peripheral edema Respiratory: Normal respiratory effort, clear breath sounds  Abdomen: Soft,non-tender,  Skin: Erythematous area surrounding right calf with scaling. Neurologic: Grossly no focal neuro deficit.  No appreciable resting tremor today, following commands appropriately Psychiatric:Appropriate affect, and mood. Mental status AAOx3  Data Reviewed: CBC: Recent Labs  Lab 02/16/18 1441 02/17/18 0129  WBC 10.4 8.3  NEUTROABS 8.1*  --   HGB 17.4* 16.5  HCT 54.8* 52.1*  MCV 97.2 96.7  PLT PLATELET CLUMPS NOTED ON SMEAR, UNABLE TO ESTIMATE 194   Basic Metabolic Panel: Recent Labs  Lab 02/16/18 1441 02/17/18 0129  NA 141 140  K 4.7 4.5  CL 106 110  CO2 27 19*  GLUCOSE 107* 93  BUN 12 12  CREATININE 1.38* 1.07  CALCIUM 9.7 9.2   GFR: Estimated Creatinine Clearance: 58.7 mL/min (by C-G formula based on SCr of 1.07 mg/dL). Liver Function Tests: Recent Labs  Lab 02/16/18 1441 02/17/18 0129  AST 35 33  ALT 23 21  ALKPHOS 74 67  BILITOT 1.9* 1.9*  PROT 7.5 7.2   ALBUMIN 3.9 3.6   No results for input(s): LIPASE, AMYLASE in the last 168 hours. No results for input(s): AMMONIA in the last 168 hours. Coagulation Profile: Recent Labs  Lab 02/16/18 1536 02/17/18 0617 02/18/18 0438  INR 1.57 1.90 2.00   Cardiac Enzymes: Recent Labs  Lab 02/16/18 2256 02/17/18 0035 02/17/18 0617 02/17/18 1300  TROPONINI <0.03 <0.03 <0.03 <0.03   BNP (last 3 results) No results for input(s): PROBNP in the last 8760 hours. HbA1C: No results for input(s): HGBA1C in the last 72 hours. CBG: No results for input(s): GLUCAP in the last 168 hours. Lipid Profile: No results for input(s): CHOL, HDL, LDLCALC, TRIG, CHOLHDL, LDLDIRECT in the last 72 hours. Thyroid Function Tests: Recent Labs    02/16/18 1929  TSH 2.105   Anemia Panel: No results for input(s): VITAMINB12, FOLATE, FERRITIN, TIBC, IRON, RETICCTPCT in the last 72 hours. Urine analysis:    Component Value Date/Time   COLORURINE YELLOW 02/16/2018 1448   APPEARANCEUR CLEAR 02/16/2018 1448   LABSPEC 1.005 02/16/2018 1448   PHURINE 7.0 02/16/2018 1448   GLUCOSEU NEGATIVE 02/16/2018 1448   HGBUR SMALL (A) 02/16/2018 1448   BILIRUBINUR NEGATIVE 02/16/2018 1448   KETONESUR NEGATIVE 02/16/2018 1448   PROTEINUR NEGATIVE 02/16/2018 1448   UROBILINOGEN 1.0 01/21/2009 0005   NITRITE NEGATIVE 02/16/2018 1448   LEUKOCYTESUR NEGATIVE 02/16/2018 1448   Sepsis Labs: @LABRCNTIP (procalcitonin:4,lacticidven:4)  ) Recent Results (from the past 240 hour(s))  MRSA PCR Screening     Status: None   Collection Time: 02/18/18  4:00 AM  Result Value Ref Range Status   MRSA by PCR NEGATIVE NEGATIVE Final    Comment:        The GeneXpert MRSA Assay (FDA approved for NASAL specimens only), is one component of a comprehensive MRSA colonization surveillance program. It is not intended to diagnose MRSA infection nor to guide or monitor treatment for MRSA infections. Performed at [facility], [address] [place], [place] [address]       Studies: No results found.  Scheduled Meds: . carbidopa-levodopa  1 tablet Oral TID  . clonazePAM  0.5 mg Oral BID  . diltiazem  120 mg Oral Daily  . dorzolamide-timolol  1 drop Both Eyes q morning - 10a  . latanoprost  1 drop Both Eyes QHS  . lithium carbonate  300 mg Oral BID  . warfarin  2.5 mg Oral ONCE-1800  . Warfarin - Pharmacist Dosing Inpatient   Does not apply q1800    Continuous Infusions:   LOS: 1 day     Laverna PeaceShayla D Nettey, MD [facility] Pager [phone]  If 7PM-7AM, please contact night-coverage [email or web address] Password [email or web address] 02/18/2018, 9:49 AM

## 2018-02-18 NOTE — Evaluation (Signed)
Occupational Therapy Evaluation Patient Details Name: Sean Bishop MRN: [id number] DOB: December 04, 1932 Today's Date: 02/18/2018    History of Present Illness 84 y.o. male admitted after fall from sidewalk and several month worsening of BLE tremors. Neurology consulted recommending OP follow up for concern of Parkinson's disease/syndrome.  PMH includes but not limited to stroke, HTN, A Fib a chronic coumadin , HTN, DJD, shoulder surgery.    Clinical Impression   Patient reports living at [facility] Independent living with spouse, using walking stick for mobility and unclear history of ADL status (initally reporting independent, then reporting needing assistance with dressing tasks but not bathing).  He currently requires min assist for UB ADL, moderate assist for LB ADL, min guard assist for grooming standing at sink, min assist for toilet transfers, and min guard assist for mobility.  Patient requires increased time for processing and initiation throughout session, highly distractible and requires cueing to problem solve through grooming tasks.  Unsure of cognitive baseline, but disoriented to month; appears to mask cognition with tangential conversation and would benefit from further cognitive assessment.  Based on performance today, recommend continued OT services while admitted and continued HHOT at discharge in order to maximize safety and independence with ADL and mobility.     Follow Up Recommendations  Home health OT;Supervision/Assistance - 24 hour    Equipment Recommendations  3 in 1 bedside commode    Recommendations for Other Services PT consult     Precautions / Restrictions Precautions Precautions: Fall Restrictions Weight Bearing Restrictions: No      Mobility Bed Mobility               General bed mobility comments: seated in recliner upon entry  Transfers Overall transfer level: Needs assistance Equipment used: Rolling walker (2 wheeled) Transfers: Sit  to/from Stand Sit to Stand: Min assist         General transfer comment: min assist to ascend from recliner, cueing for hand placement and sequencing     Balance Overall balance assessment: Needs assistance Sitting-balance support: No upper extremity supported;Feet supported Sitting balance-Leahy Scale: Fair     Standing balance support: No upper extremity supported;During functional activity Standing balance-Leahy Scale: Poor Standing balance comment: min guard for safety, reliant on UE support                           ADL either performed or assessed with clinical judgement   ADL Overall ADL's : Needs assistance/impaired Eating/Feeding: Set up;Sitting   Grooming: Min guard;Standing   Upper Body Bathing: Minimal assistance;Sitting   Lower Body Bathing: Sit to/from stand;Moderate assistance   Upper Body Dressing : Minimal assistance;Sitting   Lower Body Dressing: Maximal assistance;Sit to/from stand   Toilet Transfer: Minimal assistance;Ambulation;RW(simulated in room)   Toileting- Clothing Manipulation and Hygiene: Minimal assistance;Sit to/from stand       Functional mobility during ADLs: Min guard;Rolling walker General ADL Comments: Patient requires increased time for all activities, easily distracted and requires constant cueing for safety awareness.     Vision Baseline Vision/History: No visual deficits Vision Assessment?: No apparent visual deficits     Perception     Praxis      Pertinent Vitals/Pain Pain Assessment: No/denies pain     Hand Dominance     Extremity/Trunk Assessment Upper Extremity Assessment Upper Extremity Assessment: Generalized weakness(limited B shoulder FF to 90, patient reports chronic issues )   Lower Extremity Assessment Lower Extremity Assessment: Generalized weakness;Defer  to PT evaluation   Cervical / Trunk Assessment Cervical / Trunk Assessment: Normal   Communication Communication Communication: No  difficulties   Cognition Arousal/Alertness: Awake/alert Behavior During Therapy: WFL for tasks assessed/performed Overall Cognitive Status: No family/caregiver present to determine baseline cognitive functioning Area of Impairment: Orientation;Memory;Attention;Following commands;Safety/judgement;Awareness;Problem solving                 Orientation Level: Disoriented to;Time(oriented to year but not month) Current Attention Level: Selective Memory: Decreased short-term memory;Decreased recall of precautions Following Commands: Follows one step commands inconsistently;Follows one step commands with increased time Safety/Judgement: Decreased awareness of safety;Decreased awareness of deficits Awareness: Emergent Problem Solving: Slow processing;Decreased initiation;Difficulty sequencing;Requires verbal cues General Comments: tangental, requires verbal cueing to attend to tasks and is highly distractable; multiple cues to recall tasks and problem solve needs standing at sink     General Comments       Exercises     Shoulder Instructions      Home Living Family/patient expects to be discharged to:: Assisted living(independent living ) Living Arrangements: Spouse/significant other(friends home independent living) Available Help at Discharge: Family;Available 24 hours/day Type of Home: Apartment Home Access: Level entry;Elevator     Home Layout: One level     Bathroom Shower/Tub: Producer, television/film/videoWalk-in shower   Bathroom Toilet: Standard     Home Equipment: None(walking stick )          Prior Functioning/Environment Level of Independence: Independent        Comments: driving, community mobility with walking stick, some A with ADLs and limited IADLs         OT Problem List: Decreased strength;Decreased range of motion;Decreased activity tolerance;Impaired balance (sitting and/or standing);Decreased coordination;Decreased safety awareness;Decreased cognition;Decreased knowledge  of use of DME or AE;Decreased knowledge of precautions      OT Treatment/Interventions: Self-care/ADL training;DME and/or AE instruction;Therapeutic activities;Patient/family education;Balance training    OT Goals(Current goals can be found in the care plan section) Acute Rehab OT Goals Patient Stated Goal: to get home  OT Goal Formulation: With patient Time For Goal Achievement: 03/04/18 Potential to Achieve Goals: Good  OT Frequency: Min 2X/week   Barriers to D/C:            Co-evaluation              AM-PAC PT "6 Clicks" Daily Activity     Outcome Measure Help from another person eating meals?: None Help from another person taking care of personal grooming?: A Little Help from another person toileting, which includes using toliet, bedpan, or urinal?: A Little Help from another person bathing (including washing, rinsing, drying)?: A Lot Help from another person to put on and taking off regular upper body clothing?: A Little Help from another person to put on and taking off regular lower body clothing?: A Lot 6 Click Score: 17   End of Session Equipment Utilized During Treatment: Gait belt;Rolling walker Nurse Communication: Mobility status  Activity Tolerance: Patient tolerated treatment well Patient left: in chair;with nursing/sitter in room;with chair alarm set;with call bell/phone within reach  OT Visit Diagnosis: Other abnormalities of gait and mobility (R26.89);Muscle weakness (generalized) (M62.81);History of falling (Z91.81)                Time: 9562-13081410-1438 OT Time Calculation (min): 28 min Charges:  OT General Charges $OT Visit: 1 Visit OT Evaluation $OT Eval Moderate Complexity: 1 Mod OT Treatments $Self Care/Home Management : 8-22 mins  Chancy Milroyhristie S Nester Bachus, OTR/L  Pager [phone]   Nadean Corwinhristie S  Yardley Lekas 02/18/2018, 2:59 PM

## 2018-02-19 ENCOUNTER — Inpatient Hospital Stay (HOSPITAL_COMMUNITY): Payer: Medicare Other

## 2018-02-19 DIAGNOSIS — R930 Abnormal findings on diagnostic imaging of skull and head, not elsewhere classified: Secondary | ICD-10-CM

## 2018-02-19 DIAGNOSIS — I340 Nonrheumatic mitral (valve) insufficiency: Secondary | ICD-10-CM

## 2018-02-19 LAB — PROTIME-INR
INR: 2.02
Prothrombin Time: 22.7 seconds — ABNORMAL HIGH (ref 11.4–15.2)

## 2018-02-19 LAB — ECHOCARDIOGRAM COMPLETE
Height: 66 in
WEIGHTICAEL: 3547.2 [oz_av]

## 2018-02-19 MED ORDER — CLONAZEPAM 0.5 MG PO TABS: 0.5000 mg | ORAL_TABLET | Freq: Two times a day (BID) | ORAL | 0 refills | Status: DC

## 2018-02-19 MED ORDER — APIXABAN 5 MG PO TABS: 5.0000 mg | ORAL_TABLET | Freq: Two times a day (BID) | ORAL | 0 refills | Status: DC

## 2018-02-19 MED ORDER — DILTIAZEM HCL ER COATED BEADS 120 MG PO CP24: 120.0000 mg | ORAL_CAPSULE | Freq: Every day | ORAL | 0 refills | Status: DC

## 2018-02-19 MED ORDER — PERFLUTREN LIPID MICROSPHERE
1.0000 mL | INTRAVENOUS | Status: AC | PRN
  Administered 2018-02-19: 2 mL via INTRAVENOUS
  Filled 2018-02-19: qty 10

## 2018-02-19 MED ORDER — CARBIDOPA-LEVODOPA 10-100 MG PO TABS: 1.0000 | ORAL_TABLET | Freq: Three times a day (TID) | ORAL | 0 refills | Status: DC

## 2018-02-19 NOTE — Progress Notes (Signed)
  Echocardiogram 2D Echocardiogram has been performed.  Sean Bishop 02/19/2018, 11:26 AM

## 2018-02-19 NOTE — Progress Notes (Signed)
ANTICOAGULATION CONSULT NOTE Pharmacy Consult for warfarin Indication: atrial fibrillation  Allergies  Allergen Reactions  . Lipitor [Atorvastatin] Other (See Comments)    Joint soreness    Patient Measurements:  Vital Signs: Temp: 97.2 F (36.2 C) (08/24 0436) Temp Source: Oral (08/24 0436) BP: 137/84 (08/24 0436) Pulse Rate: 78 (08/24 0436)  Labs: Recent Labs    02/16/18 1441  02/17/18 0035 02/17/18 0129 02/17/18 0617 02/17/18 1300 02/18/18 0438 02/19/18 0450  HGB 17.4*  --   --  16.5  --   --   --   --   HCT 54.8*  --   --  52.1*  --   --   --   --   PLT PLATELET CLUMPS NOTED ON SMEAR, UNABLE TO ESTIMATE  --   --  194  --   --   --   --   LABPROT  --    < >  --   --  21.7*  --  22.5* 22.7*  INR  --    < >  --   --  1.90  --  2.00 2.02  CREATININE 1.38*  --   --  1.07  --   --   --   --   TROPONINI  --    < > <0.03  --  <0.03 <0.03  --   --    < > = values in this interval not displayed.    Estimated Creatinine Clearance: 59.1 mL/min (by C-G formula based on SCr of 1.07 mg/dL).   Medical History: Past Medical History:  Diagnosis Date  . Atrial fibrillation (HCC)   . Bipolar 1 disorder (HCC)   . DJD (degenerative joint disease)   . Hypertension   . Mitral regurgitation    mild   . Renal insufficiency   . Stroke (HCC)   . Tremors of nervous system 01/2018    Assessment: Sean Bishop is an 84 y.o. Male with hx of atrial fibrillation on warfarin. PTA warfarin regimen per patient and wife is 5mg  BID (last dose taken 8/20 PTA). He seems confused in regards to his warfarin dosing and does not think that he regularly has his INR checked. His CHADSVASc = 5. On admission, subtherapeutic with INR 1.57.  INR 2.02 plan to change to apixaban when INR < 2 Patient being dc today start apixaban 5mg  BID tonight Cr 1, Age > 80, Wt > 60kg    Goal of Therapy:  INR 2-3 Monitor platelets by anticoagulation protocol: Yes   Plan:  Stop warfarin Start apixaban 5mg  BID  tonight   Leota SauersLisa Jeferson Boozer Pharm.D. CPP, BCPS Clinical Pharmacist [phone] 02/19/2018 10:18 AM

## 2018-02-19 NOTE — Care Management Note (Signed)
Case Management Note  Patient Details  Name: Sean Bishop MRN: [id number] Date of Birth: 1933/05/14  Subjective/Objective:                    Action/Plan:  Spoke w patient at bedside. [facility]. Needs RW. [facility]. No other CM needs.    Expected Discharge Date:  02/19/18               Expected Discharge Plan:  Home w Home Health Services  In-House Referral:  NA  Discharge planning Services  CM Consult  Post Acute Care Choice:  Home Health, Durable Medical Equipment Choice offered to:  Patient  DME Arranged:  Walker rolling DME Agency:  [facility]  HH Arranged:  [facility]  Status of Service:  Completed, signed off  If discussed at Long Length of Stay Meetings, dates discussed:    Additional Comments:  Lawerance SabalDebbie Melaney Tellefsen, RN 02/19/2018, 11:01 AM

## 2018-02-19 NOTE — Discharge Summary (Signed)
Discharge Summary  Sean Bishop:[id number] DOB: 1933/05/20  PCP: Juluis Rainier, MD  Admit date: 02/16/2018 Discharge date: 02/19/2018   Time spent: < 25 minutes  Admitted From: Home Disposition: Home  Recommendations for Outpatient Follow-up:  1. Follow up with PCP in 1 to 2 weeks 2. Arrange outpatient neurology follow-up, movement disorder specialist needed to assess possible NPH versus Parkinson's disease-started on Sinemet (Parkinson's?)  And Klonopin (essential tremor?) 3. Eliquis 5 mg twice daily (discontinue warfarin) 4. Diltiazem 120 mg daily    Discharge Diagnoses:  Active Hospital Problems   Diagnosis Date Noted  . Atrial fibrillation with RVR (HCC) 02/16/2018  . Bipolar disorder (HCC) 02/17/2018  . Atrial fibrillation (HCC) 02/17/2018  . Gait instability 02/16/2018  . Tremor 02/16/2018  . Renal insufficiency 02/16/2018  . Polycythemia 02/16/2018  . Hypertension 02/16/2018  . AF (paroxysmal atrial fibrillation) (HCC) 02/16/2018    Resolved Hospital Problems  No resolved problems to display.    Discharge Condition: Stable   CODE STATUS:FULL  Diet recommendation:    History of present illness:  Sean Bishop a 84 y.o.year old malewith medical history significant for bipolar disorder, atrial for ablation on chronic Coumadin, degenerative disc disease, HTN and stroke historywho presented on 8/21/2019with increased weakness and tremors in lower legs. He noticedhis left foot was shaking terribly while stepping off a curb and felt like he couldn't control it. The same occurred with his left foot. He was unable to balance himself and fell on his right side on the sidewalk. He denies any head trauma or LOC. Denies any chest pain, palpitations, changes in vision prior to the fall.   Hospital Course:   Chronic atrial fibrillation, rate controlled.  Briefly in RVR requiring IV diltiazem drip (heart rate in 130s) disease.  Patient's primary cardiologist  Dr. Viann Fish recommended starting low-dose diltiazem which achieved good rate control.  He was switched from warfarin to INR variability to Eliquis 5 mg twice daily on discharge..  Although he has an unsteady gait cardiology recommended continue his anticoagulation because of his previous stroke history.  Gait instability and tremors, concern for Parkinson's plus syndrome Patient presented after a fall related to imbalance from persistent tremors in lower legs.  CT scan showed no acute intracranial abnormalities.  MRI brain shows severe brain volume loss with superimposed findings of normal pressure hydrocephaly as well as old lacunar infarct.  Neurology suspected Parkinson's disease or Parkinson's plus syndrome.  Patient was started on Sinemet trial.  Patient also prescribed Klonopin as his tremor did seem to respond to IV Ativan in ED, he may have some component of a severe essential tremor.  He will need follow-up with neurology as mentioned above   Consultations:  Cardiology and neurology  Procedures/Studies: none  Discharge Exam: BP (!) 118/97   Pulse 78   Temp (!) 97.2 F (36.2 C) (Oral)   Resp 20   Ht 5\' 6"  (1.676 m)   Wt 100.6 kg   SpO2 100%   BMI 35.78 kg/m   Constitutional:normal appearing male Eyes: EOMI, anicteric, normal conjunctivae ENMT: Oropharynx with moist mucous membranes, normal dentition Cardiovascular: Irregularly irregular, normal rate no MRGs, with no peripheral edema Respiratory: Normal respiratory effort, clear breath sounds  Abdomen: Soft,non-tender,  Skin: Erythematous area surrounding right calf with scaling. Neurologic: Grossly no focal neuro deficit.  No appreciable resting tremor today, following commands appropriately with no focal neurologic deficits Psychiatric:Appropriate affect, and mood. Mental status AAOx3   Discharge Instructions You were cared for  by a hospitalist during your hospital stay. If you have any questions about your  discharge medications or the care you received while you were in the hospital after you are discharged, you can call the unit and asked to speak with the hospitalist on call if the hospitalist that took care of you is not available. Once you are discharged, your primary care physician will handle any further medical issues. Please note that NO REFILLS for any discharge medications will be authorized once you are discharged, as it is imperative that you return to your primary care physician (or establish a relationship with a primary care physician if you do not have one) for your aftercare needs so that they can reassess your need for medications and monitor your lab values.  Discharge Instructions    Diet - low sodium heart healthy   Complete by:  As directed    Diet - low sodium heart healthy   Complete by:  As directed    Increase activity slowly   Complete by:  As directed    Increase activity slowly   Complete by:  As directed      Allergies as of 02/19/2018      Reactions   Lipitor [atorvastatin] Other (See Comments)   Joint soreness      Medication List    STOP taking these medications   warfarin 5 MG tablet Commonly known as:  COUMADIN     TAKE these medications   acetaminophen 500 MG tablet Commonly known as:  TYLENOL Take 500 mg by mouth 2 (two) times daily.   apixaban 5 MG Tabs tablet Commonly known as:  ELIQUIS Take 1 tablet (5 mg total) by mouth 2 (two) times daily. Start in evening of 02/19/18   CALCIUM-VITAMIN D PO Take 1 tablet by mouth daily.   carbidopa-levodopa 10-100 MG tablet Commonly known as:  SINEMET IR Take 1 tablet by mouth 3 (three) times daily.   clonazePAM 0.5 MG tablet Commonly known as:  KLONOPIN Take 1 tablet (0.5 mg total) by mouth 2 (two) times daily.   diltiazem 120 MG 24 hr capsule Commonly known as:  CARDIZEM CD Take 1 capsule (120 mg total) by mouth daily.   dorzolamide-timolol 22.3-6.8 MG/ML ophthalmic solution Commonly known as:   COSOPT Place 1 drop into both eyes every morning.   latanoprost 0.005 % ophthalmic solution Commonly known as:  XALATAN Place 1 drop into both eyes at bedtime.   lithium carbonate 300 MG CR tablet Commonly known as:  LITHOBID Take 300 mg by mouth 2 (two) times daily.   multivitamin with minerals Tabs tablet Take 1 tablet by mouth daily.   VITAMIN C PO Take 1 tablet by mouth daily.   VITAMIN D PO Take 1 tablet by mouth daily.            Durable Medical Equipment  (From admission, onward)         Start     Ordered   02/18/18 1146  For home use only DME Walker rolling  Once    Question:  Patient needs a walker to treat with the following condition  Answer:  Gait abnormality   02/18/18 1145         Allergies  Allergen Reactions  . Lipitor [Atorvastatin] Other (See Comments)    Joint soreness   Follow-up Information    [facility]. Schedule an appointment as soon as possible for a visit.   Contact information: [address]  [address] [place] [address] [phone]           The results of significant diagnostics from this hospitalization (including imaging, microbiology, ancillary and laboratory) are listed below for reference.    Significant Diagnostic Studies: Ct Head Wo Contrast  Result Date: 02/16/2018 CLINICAL DATA:  Dizziness. EXAM: CT HEAD WITHOUT CONTRAST TECHNIQUE: Contiguous axial images were obtained from the base of the skull through the vertex without intravenous contrast. COMPARISON:  CT scan of August 23, 2009. FINDINGS: Brain: Mild chronic ischemic white matter disease is noted. Mild ventricular dilatation is noted. No midline shift or mass effect is noted. There is no evidence of hemorrhage, mass lesion or acute infarction. Vascular: No hyperdense vessel or unexpected calcification. Skull: Normal. Negative for fracture or focal lesion. Sinuses/Orbits: No acute finding. Other: None. IMPRESSION:  Mild chronic ischemic white matter disease is noted. Mild ventricular dilatation is noted; this is concerning for possible normal pressure hydrocephalus. Clinical correlation is recommended. No other abnormality is noted. Electronically Signed   By: Lupita Raider, M.D.   On: 02/16/2018 16:04   Mr Brain Wo Contrast  Result Date: 02/16/2018 CLINICAL DATA:  Worsening hand LEFT leg tremor for 4 years. History of atrial fibrillation, hypertension and stroke. EXAM: MRI HEAD WITHOUT CONTRAST TECHNIQUE: Multiplanar, multiecho pulse sequences of the brain and surrounding structures were obtained without intravenous contrast. COMPARISON:  CT HEAD February 16, 2018 and MRI of the head September 26, 2009 FINDINGS: Multiple sequences are moderately motion degraded. INTRACRANIAL CONTENTS: No reduced diffusion to suggest acute ischemia. Susceptibility artifact LEFT frontal lobe versus motion artifact. Moderate to severe ventriculomegaly with disproportionate sulcal effacement at the convexities and narrowed callosum angle. Confluent supratentorial white matter FLAIR T2 hyperintensities. Old LEFT medial thalamus lacunar infarct. No midline shift, mass effect or masses. No abnormal extra-axial fluid collections. VASCULAR: Normal major intracranial vascular flow voids present at skull base. SKULL AND UPPER CERVICAL SPINE: No abnormal sellar expansion; pituitary cyst reported on prior examination obscured by motion. No suspicious calvarial bone marrow signal. Craniocervical junction maintained. SINUSES/ORBITS: Trace paranasal sinus mucosal thickening without air-fluid levels. Minimal RIGHT mastoid effusion. Included ocular globes and orbital contents are non-suspicious. Status post bilateral ocular lens implants. OTHER: None. IMPRESSION: 1. Moderately motion degraded examination. No acute intracranial process. 2. Moderate to severe parenchymal brain volume loss with superimposed findings of normal pressure hydrocephalus. 3. Moderate  to severe chronic small vessel ischemic changes. 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 Electronically Signed   By: Awilda Metro M.D.   On: 02/16/2018 22:09    Microbiology: Recent Results (from the past 240 hour(s))  MRSA PCR Screening     Status: None   Collection Time: 02/18/18  4:00 AM  Result Value Ref Range Status   MRSA by PCR NEGATIVE NEGATIVE Final    Comment:        The GeneXpert MRSA Assay (FDA approved for NASAL specimens only), is one component of a comprehensive MRSA colonization surveillance program. It is not intended to diagnose MRSA infection nor to guide or monitor treatment for MRSA infections. Performed at [facility], [address], [place], [place] [address]      Labs: Basic Metabolic Panel: Recent Labs  Lab 02/16/18 1441 02/17/18 0129  NA 141 140  K 4.7 4.5  CL 106 110  CO2 27 19*  GLUCOSE 107* 93  BUN 12 12  CREATININE 1.38* 1.07  CALCIUM 9.7 9.2   Liver Function Tests: Recent Labs  Lab 02/16/18 1441  02/17/18 0129  AST 35 33  ALT 23 21  ALKPHOS 74 67  BILITOT 1.9* 1.9*  PROT 7.5 7.2  ALBUMIN 3.9 3.6   No results for input(s): LIPASE, AMYLASE in the last 168 hours. No results for input(s): AMMONIA in the last 168 hours. CBC: Recent Labs  Lab 02/16/18 1441 02/17/18 0129  WBC 10.4 8.3  NEUTROABS 8.1*  --   HGB 17.4* 16.5  HCT 54.8* 52.1*  MCV 97.2 96.7  PLT PLATELET CLUMPS NOTED ON SMEAR, UNABLE TO ESTIMATE 194   Cardiac Enzymes: Recent Labs  Lab 02/16/18 2256 02/17/18 0035 02/17/18 0617 02/17/18 1300  TROPONINI <0.03 <0.03 <0.03 <0.03   BNP: BNP (last 3 results) No results for input(s): BNP in the last 8760 hours.  ProBNP (last 3 results) No results for input(s): PROBNP in the last 8760 hours.  CBG: No results for input(s): GLUCAP in the last 168 hours.     Signed:  Laverna PeaceShayla D Nettey, MD [facility] 02/19/2018, 10:06 AM

## 2018-02-19 NOTE — Progress Notes (Signed)
[facility] Anticipated DC Date:02/19/18 Family Notified:yes, Tobie Poetlizabeth Agredano [phone] Transport By: Karlene LinemanSon, Andy Jarquin   [facility] . RN, patient, patient's family, and facility notified of DC. RN given number for report [phone]). DC packet on chart. Patient's son will transport patient home. CSW signing off.  Budd Palmerara Dharma Pare LCSWA [phone]

## 2018-02-23 DIAGNOSIS — I480 Paroxysmal atrial fibrillation: Secondary | ICD-10-CM | POA: Diagnosis not present

## 2018-02-23 DIAGNOSIS — M1990 Unspecified osteoarthritis, unspecified site: Secondary | ICD-10-CM | POA: Diagnosis not present

## 2018-02-23 DIAGNOSIS — Z8673 Personal history of transient ischemic attack (TIA), and cerebral infarction without residual deficits: Secondary | ICD-10-CM | POA: Diagnosis not present

## 2018-02-23 DIAGNOSIS — I10 Essential (primary) hypertension: Secondary | ICD-10-CM | POA: Diagnosis not present

## 2018-02-23 DIAGNOSIS — R251 Tremor, unspecified: Secondary | ICD-10-CM | POA: Diagnosis not present

## 2018-02-23 DIAGNOSIS — Z87891 Personal history of nicotine dependence: Secondary | ICD-10-CM | POA: Diagnosis not present

## 2018-02-23 DIAGNOSIS — Z7901 Long term (current) use of anticoagulants: Secondary | ICD-10-CM | POA: Diagnosis not present

## 2018-02-23 DIAGNOSIS — D751 Secondary polycythemia: Secondary | ICD-10-CM | POA: Diagnosis not present

## 2018-02-23 DIAGNOSIS — F319 Bipolar disorder, unspecified: Secondary | ICD-10-CM | POA: Diagnosis not present

## 2018-03-01 DIAGNOSIS — R251 Tremor, unspecified: Secondary | ICD-10-CM | POA: Diagnosis not present

## 2018-03-01 DIAGNOSIS — Z79899 Other long term (current) drug therapy: Secondary | ICD-10-CM | POA: Diagnosis not present

## 2018-03-01 DIAGNOSIS — I482 Chronic atrial fibrillation: Secondary | ICD-10-CM | POA: Diagnosis not present

## 2018-03-01 DIAGNOSIS — Z7901 Long term (current) use of anticoagulants: Secondary | ICD-10-CM | POA: Diagnosis not present

## 2018-03-07 DIAGNOSIS — E291 Testicular hypofunction: Secondary | ICD-10-CM | POA: Diagnosis not present

## 2018-03-09 ENCOUNTER — Ambulatory Visit: Payer: Medicare Other | Admitting: Neurology

## 2018-03-09 ENCOUNTER — Encounter: Payer: Self-pay | Admitting: Neurology

## 2018-03-09 VITALS — BP 126/70 | HR 84 | Wt 223.0 lb

## 2018-03-09 DIAGNOSIS — R2689 Other abnormalities of gait and mobility: Secondary | ICD-10-CM

## 2018-03-09 DIAGNOSIS — R251 Tremor, unspecified: Secondary | ICD-10-CM

## 2018-03-09 NOTE — Patient Instructions (Addendum)
Please make sure you drink more water.  Please cut back on soda and tea.  Please taper off the clonazepam by taking 1/2 pill once a day for 4 days, then stop, as you have 2 pills left.  In the future, I would avoid this medication and related medications.   I do see any telltale signs of Parkinson's disease or what we call parkinsonism.  Your tremor may be from taking Lithium long-term.   Please taper off the Sinemet by taking one pill 2 times a day for 3 days, then one pill once daily for 3 days, then stop.   Fall risk is real! Please remember to stand up slowly and get your bearings first turn slowly, no bending down to pick anything, no heavy lifting, be extra careful at night and first thing in the morning. Also, be careful in the Bathroom and the kitchen.   Please use your walker at all times.   Your gait disorder is likely due to a combination of factors. These factors include: normal aging, prior stroke, suboptimal hydration, taking medication chronically that can alter your balance, obesity, leg swelling, and atherosclerosis of the blood vessels in your brain.  As far as diagnostic testing: I do not suggest any additional testing from my end of things. I would be happy to see you back as needed.

## 2018-03-09 NOTE — Progress Notes (Signed)
Subjective:    Patient ID: Sean Bishop is a 84 y.o. male.  HPI     Sean Foley, MD, PhD [facility] [address] [address] [place], [place] [address]  I saw patient, Sean Bishop, is a referral from the hospital. The patient is accompanied by his wife today. He is an 46 year old right-handed gentleman with an underlying complex medical history of A. fib, bipolar disease, renal insufficiency, hypertension, polycythemia, degenerative disc disease, history of stroke, and obesity, who was recently hospitalized after a fall. He was found to be in rapid ventricular rate with his A. fib requiring IV diltiazem for rate control. He was switched from Coumadin to Eliquis. Because of his gait instability and fall risk he was deemed unsafe to continue with Coumadin. He was suspected to have parkinsonism and was started on Sinemet. He was prescribed clonazepam for tremor and was treated with IV Ativan in the emergency room. He was discharged on Sinemet generic 10-100 milligrams strength one pill 3 times a day. He was also placed on clonazepam 0.5 mg strength one pill twice daily, of note, he has been on long-acting lithium 300 mg twice a day. He was sent home with a rolling walker. He had a head CT without contrast on 02/16/2018 and I reviewed the results: IMPRESSION: Mild chronic ischemic white matter disease is noted. Mild ventricular dilatation is noted; this is concerning for possible normal pressure hydrocephalus. Clinical correlation is recommended. No other abnormality is noted.   He had a brain MRI without contrast on 02/16/2018 and I reviewed the results: IMPRESSION:  1. Moderately motion degraded examination. No acute intracranial process. 2. Moderate to severe parenchymal brain volume loss with superimposed findings of normal pressure hydrocephalus. 3. Moderate to severe chronic small vessel ischemic changes. 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   Of note, his  prior brain MRI with and without contrast from 09/26/2009 showed: IMPRESSION: Expected evolutionary changes relating to a subacute infarction in the left medial thalamus.  This no longer shows restricted diffusion.  No other acute or subacute insult is seen.   Chronic small vessel disease elsewhere appears the same.  Chronic ventriculomegaly related to central atrophy appears the same.   3.5 mm cyst in the right side of the pituitary.  This is unlikely to be of any clinical relevance.  He was referred to neurology for tremor, concerning for parkinsonism, concern for NPH. However, comparing head CT findings from 2011 and 2019 showed dilatation of lateral ventricles more in keeping with ex vacuo dilatation. He was not reported to have ventriculomegaly secondary to NPH concern in 2011 but comparing both scans shows progression of white matter disease and atrophy as well as ventriculomegaly in 8 years.  He has been on Li for almost 20 years, longstanding Hx of bipolar after age 18. PCP maintains Rx and sees psych once a year.  Li level in the hospital was slightly on the subtherapeutic, not high.  He lives with wife at [facility]. Only son is 28 and is a Engineer, civil (consulting). He does check on them whenever he can. Patient is currently getting home health physical therapy and speech therapy. He is using his 2 wheeled walker inside the apartment as well. He does not have a history of recurrent falls. He did not notice much in the way of change or improvement after starting the clonazepam or the Sinemet.  His Past Medical History Is Significant For: Past Medical History:  Diagnosis Date  . Atrial  fibrillation (HCC)   . Bipolar 1 disorder (HCC)   . DJD (degenerative joint disease)   . Hypertension   . Mitral regurgitation    mild   . Renal insufficiency   . Stroke (HCC)   . Tremors of nervous system 01/2018    His Past Surgical History Is Significant For: Past Surgical History:  Procedure Laterality  Date  . SHOULDER SURGERY      His Family History Is Significant For: Family History  Problem Relation Age of Onset  . Alcohol abuse Mother   . Cancer Father     His Social History Is Significant For: Social History   Socioeconomic History  . Marital status: Married    Spouse name: Not on file  . Number of children: Not on file  . Years of education: Not on file  . Highest education level: Not on file  Occupational History  . Not on file  Social Needs  . Financial resource strain: Not on file  . Food insecurity:    Worry: Not on file    Inability: Not on file  . Transportation needs:    Medical: Not on file    Non-medical: Not on file  Tobacco Use  . Smoking status: Former Games developer  . Smokeless tobacco: Never Used  Substance and Sexual Activity  . Alcohol use: Yes    Alcohol/week: 1.0 standard drinks    Types: 1 Glasses of wine per week    Comment: 1 per month  . Drug use: No  . Sexual activity: Not on file  Lifestyle  . Physical activity:    Days per week: Not on file    Minutes per session: Not on file  . Stress: Not on file  Relationships  . Social connections:    Talks on phone: Not on file    Gets together: Not on file    Attends religious service: Not on file    Active member of club or organization: Not on file    Attends meetings of clubs or organizations: Not on file    Relationship status: Not on file  Other Topics Concern  . Not on file  Social History Narrative  . Not on file    His Allergies Are:  Allergies  Allergen Reactions  . Lipitor [Atorvastatin] Other (See Comments)    Joint soreness  :   His Current Medications Are:  Outpatient Encounter Medications as of 03/09/2018  Medication Sig  . acetaminophen (TYLENOL) 500 MG tablet Take 500 mg by mouth 2 (two) times daily.   Marland Kitchen apixaban (ELIQUIS) 5 MG TABS tablet Take 1 tablet (5 mg total) by mouth 2 (two) times daily. Start in evening of 02/19/18  . Ascorbic Acid (VITAMIN C PO) Take 1  tablet by mouth daily.  . carbidopa-levodopa (SINEMET IR) 10-100 MG tablet Take 1 tablet by mouth 3 (three) times daily.  . Cholecalciferol (VITAMIN D PO) Take 1 tablet by mouth daily.  . clonazePAM (KLONOPIN) 0.5 MG tablet Take 1 tablet (0.5 mg total) by mouth 2 (two) times daily.  Marland Kitchen diltiazem (CARDIZEM CD) 120 MG 24 hr capsule Take 1 capsule (120 mg total) by mouth daily.  . dorzolamide-timolol (COSOPT) 22.3-6.8 MG/ML ophthalmic solution Place 1 drop into both eyes every morning.  . latanoprost (XALATAN) 0.005 % ophthalmic solution Place 1 drop into both eyes at bedtime.  Marland Kitchen lithium carbonate (LITHOBID) 300 MG CR tablet Take 300 mg by mouth 2 (two) times daily.  . Multiple Vitamin (MULTIVITAMIN WITH MINERALS)  TABS tablet Take 1 tablet by mouth daily.  . [DISCONTINUED] CALCIUM-VITAMIN D PO Take 1 tablet by mouth daily.   No facility-administered encounter medications on file as of 03/09/2018.   :   Review of Systems:  Out of a complete 14 point review of systems, all are reviewed and negative with the exception of these symptoms as listed below:  Review of Systems  Neurological:       Pt presents today to discuss his fall.     Objective:  Neurological Exam  Physical Exam Physical Examination:   Vitals:   03/09/18 1408  BP: 126/70  Pulse: 84   General Examination: The patient is a very pleasant 84 y.o. male in no acute distress. He appears well-developed and well-nourished and well groomed.   HEENT: Normocephalic, atraumatic, pupils are equal, round and reactive to light and accommodation. Extraocular tracking is mildly difficult for him. He has no significant nuchal rigidity. He has no lip, neck or jaw tremor. Face is symmetric, fairly normal facial animation, no hypophonia. Hearing is grossly intact. Airway examination reveals mild to moderate mouth dryness, tongue protrudes centrally and palate elevates symmetrically, no dysarthria.  Chest: Clear to auscultation without  wheezing, rhonchi or crackles noted.  Heart: S1+S2+0, irregularly irregular.   Abdomen: Soft, non-tender and non-distended with normal bowel sounds appreciated on auscultation.  Extremities: There is 2+ pitting edema in the distal lower extremities bilaterally. Chronic appearing changes in both distal lower extremities with thickening of the skin noted.  Skin: Warm and rather dry.   Musculoskeletal: exam reveals no obvious joint deformities, tenderness or joint swelling or erythema.   Neurologically:  Mental status: The patient is awake, alert and oriented in all 4 spheres. His immediate and remote memory, attention, language skills and fund of knowledge are appropriate. There is no evidence of aphasia, agnosia, apraxia or anomia. Speech is clear with normal prosody and enunciation. Thought process is linear. Mood is normal and affect is normal.  Cranial nerves II - XII are as described above under HEENT exam. In addition: shoulder shrug is normal with equal shoulder height noted. Motor exam: Normal bulk, global strength of 5 minus out of 5. Tone is fairly normal in all 4 extremities. He has no significant resting tremor with the exception of an intermittent right foot tremor. It is mild, no other resting tremor. He has a minimal bilateral upper extremity postural and action tremor. Fine motor skills are globally mildly impaired. Reflexes are 1+ in the upper extremities and trace in the lower extremities. Cerebellar testing: No dysmetria or intention tremor. Heel to shin is difficult for him bilaterally, secondary to body habitus but no dysmetria is noted. Sensory exam: intact to light touch in the upper and lower extremities.  Gait, station and balance: He stands with significant difficulty and pushes himself up. He requires no assistance, stands wide-based, posture is otherwise age-appropriate. He walks with mild insecurity and wide-based without the walker for a few steps, walks better with his  2 wheeled walker. No shuffling, no significant reduction in arm swing bilaterally, no magnetic gait. No propulsion. Balance is globally mildly impaired.  Assessment and Plan:   In summary, Sean Bishop is a very pleasant 84 y.o.-year old male with an underlying complex medical history of A. fib, bipolar disease, renal insufficiency, hypertension, polycythemia, degenerative disc disease, history of stroke, and obesity, who Presents for evaluation of his gait disorder. He had a recent fall which prompted hospitalization workup. He was found to be  in rapid ventricular rate with his A. Fib, was started on IV diltiazem and is now on diltiazem by mouth. He was also started on clonazepam for tremor. He was started on Sinemet for parkinsonism. I believe that the patient has a more multifactorial gait disorder secondary to prior stroke, and cerebrovascular atherosclerosis, ventriculomegaly is most typically on my comparison secondary to ex vacuo dilatation. Of note, he had ventriculomegaly on scans several years ago including 2011. He does not have a classic magnetic gait. He does not have much in the way of parkinsonism, certainly no lateralization noted. He does have a tremor which could be secondary to lithium. He has been on it for nearly 20 years. His lithium level was if anything subtherapeutic in the hospital, certainly no concern for lithium toxicity. Obesity and lower extremity edema could be contributing factors for balance issues. He is at this juncture advised to continue to use his walker at all times, stand up slowly and change positions slowly, continue with home health physical therapy and taper off the clonazepam. I would not favor this medication for tremor of any kind in the elderly. He is agreeable to reducing it to half a pill once daily for 4 days and then stop, he has 2 pills left. In addition I would like for him to taper off Sinemet as I do not see any telltale signs of parkinsonism or a history  in keeping with Parkinson's disease. He is agreeable to tapering the Sinemet by reducing to 1 pill twice daily for 3 days then 1 pill once daily for 3 days then stop. He is advised to follow-up with his primary care physician routinely. I would be happy to see him back on an as-needed basis. I do believe he could to a little better with his day-to-day hydration with water and he is encouraged to curb his caffeine and soda intake also his sweet tea intake. I answered all their questions today and the patient and his wife were in agreement.  I spent 60 minutes in total face-to-face time with the patient, more than 50% of which was spent in counseling and coordination of care, reviewing test results, reviewing medication and discussing or reviewing the diagnosis of gait d/o, its prognosis and treatment options. Pertinent laboratory and imaging test results that were available during this visit with the patient were reviewed by me and considered in my medical decision making (see chart for details).   Sean Foley, MD, PhD

## 2018-03-21 ENCOUNTER — Other Ambulatory Visit: Payer: Self-pay | Admitting: Nurse Practitioner

## 2018-03-25 ENCOUNTER — Telehealth: Payer: Self-pay | Admitting: Neurology

## 2018-03-25 NOTE — Telephone Encounter (Signed)
Noted, thanks!

## 2018-03-25 NOTE — Telephone Encounter (Signed)
Pt's wife called back, she has "it figured out", she does not need a call back

## 2018-03-25 NOTE — Telephone Encounter (Signed)
Patients wife called and has some questions regarding a medication the patient is on. Please call and advise.

## 2018-04-01 DIAGNOSIS — E291 Testicular hypofunction: Secondary | ICD-10-CM | POA: Diagnosis not present

## 2018-04-05 DIAGNOSIS — T148XXA Other injury of unspecified body region, initial encounter: Secondary | ICD-10-CM | POA: Diagnosis not present

## 2018-04-05 DIAGNOSIS — R2681 Unsteadiness on feet: Secondary | ICD-10-CM | POA: Diagnosis not present

## 2018-04-05 DIAGNOSIS — Z23 Encounter for immunization: Secondary | ICD-10-CM | POA: Diagnosis not present

## 2018-04-21 ENCOUNTER — Encounter: Payer: Self-pay | Admitting: Physical Therapy

## 2018-04-21 ENCOUNTER — Other Ambulatory Visit: Payer: Self-pay

## 2018-04-21 ENCOUNTER — Ambulatory Visit: Payer: Medicare Other | Attending: Family Medicine | Admitting: Physical Therapy

## 2018-04-21 DIAGNOSIS — R278 Other lack of coordination: Secondary | ICD-10-CM | POA: Insufficient documentation

## 2018-04-21 DIAGNOSIS — R2689 Other abnormalities of gait and mobility: Secondary | ICD-10-CM | POA: Insufficient documentation

## 2018-04-21 DIAGNOSIS — M6281 Muscle weakness (generalized): Secondary | ICD-10-CM | POA: Diagnosis not present

## 2018-04-21 DIAGNOSIS — R2681 Unsteadiness on feet: Secondary | ICD-10-CM | POA: Diagnosis not present

## 2018-04-21 DIAGNOSIS — R296 Repeated falls: Secondary | ICD-10-CM | POA: Insufficient documentation

## 2018-04-21 NOTE — Therapy (Signed)
[facility] [address] [place], [place], [address] Phone: [phone]   Fax:  [phone]  Physical Therapy Evaluation  Patient Details  Name: Sean Bishop MRN: [id number] Date of Birth: February 18, 1933 Referring Provider (PT): Dr. Juluis Rainier    Encounter Date: 04/21/2018  PT End of Session - 04/21/18 1556    Visit Number  1    Number of Visits  13    Date for PT Re-Evaluation  06/20/18    Authorization Type  BCBS Medicare  - 10th visit PN    PT Start Time  1015    PT Stop Time  1100    PT Time Calculation (min)  45 min    Activity Tolerance  Patient tolerated treatment well    Behavior During Therapy  [facility] for tasks assessed/performed       Past Medical History:  Diagnosis Date  . Atrial fibrillation (HCC)   . Bipolar 1 disorder (HCC)   . DJD (degenerative joint disease)   . Hypertension   . Mitral regurgitation    mild   . Renal insufficiency   . Stroke (HCC)   . Tremors of nervous system 01/2018    Past Surgical History:  Procedure Laterality Date  . SHOULDER SURGERY      There were no vitals filed for this visit.   Subjective Assessment - 04/21/18 1024    Subjective  Reports experiencing a fall on 8/21 when exiting his car. He was walking on pavement and his legs began to shake causing him to lose his balance with nothing to hold on to for stability. Pt reports he uses his RW for household and community ambulation but forgot to take it with him leading to his fall episode. Pt reports he was able to recover from fall with the help of assistance in area. Went to the hospital for 4 days and then was transferred to rehab. Reports never experiencing this type of LE shaking since fall in August. Was started on Sinemet for possible Parkinson's vs. parkinsonism but was recently weaned off by neurology.  Imaging revealed normal pressure hydrocephalus and white matter atrophy.  Reports he believes he is close  to his PLOF before the fall but continues to experience balance issues creating instability.     Pertinent History  normal pressure hydrocephalus, DDD, h/o thalamic lacunar Stroke, Osteoporosis, Atrial Fibrillation, Bipolar Disorder - long term use of Lithium, Tremor, Renal Insufficiency, Polycythemia, HTN, paroxysmal atrial fibrillation    Limitations  Standing;Walking    Diagnostic tests  MRI    Patient Stated Goals  Regain motor skills such as trusting in his balance with improved overall stability. Improvement in confidence with dec reliance on walker for stability.     Currently in Pain?  No/denies         [facility] PT Assessment - 04/21/18 1036      Assessment   Medical Diagnosis  Unsteady Gait     Referring Provider (PT)  Dr. Juluis Rainier     Onset Date/Surgical Date  04/12/18   Experienced fall on 8/21    Hand Dominance  Right    Next MD Visit  Pt unable to recall exact date but does have a follow up appt. scheduled     Prior Therapy  HHPT and SLP      Precautions   Precautions  Fall    Precaution Comments  normal pressure hydrocephalus, DDD, h/o thalamic lacunar Stroke, Osteoporosis, Atrial Fibrillation, Bipolar Disorder - long  term use of Lithium, Tremor, Renal Insufficiency, Polycythemia, HTN, paroxysmal atrial fibrillation      Restrictions   Weight Bearing Restrictions  No      Balance Screen   Has the patient fallen in the past 6 months  Yes    How many times?  1    Has the patient had a decrease in activity level because of a fear of falling?   Yes    Is the patient reluctant to leave their home because of a fear of falling?   Yes   Has not been authorized to drive for 2 months 2/2 safety     Home Environment   Living Environment  Private residence    Living Arrangements  Spouse/significant other    Available Help at Discharge  Family    Type of Home  Independent living facility    Home Access  Level entry   Takes elevator to 3rd floor    Home Layout  One  level    Home Equipment  Walker - 2 wheels;Grab bars - toilet;Grab bars - tub/shower    Additional Comments  Level entry into building, takes elevator to third level apartment      Prior Function   Level of Independence  Requires assistive device for independence   RW   Leisure  Enjoys walking in the community with his wife       Cognition   Overall Cognitive Status  Within Functional Limits for tasks assessed   Reports issues with memory & was receiving prior SLP     Observation/Other Assessments   Observations  Pt demonstrates pitting edema to bilateral LE ankles. Therapist educated regarding the importance of performing ankle pumps and following up with his physician for alternative treatment methods. Therapist cut both socks to prevent further compression on skin restricting blood flow. Pt demonstrates observable resting tremor to L LE when resting in seated position. Reports he does not experience resting tremors to UE's.       Observation/Other Assessments-Edema    Edema  --   pitting edema to bilateral LE ankles     Sensation   Light Touch  Appears Intact      Coordination   Gross Motor Movements are Fluid and Coordinated  No    Heel Shin Test  deficits noted bilateral 2/2 to dec hip ER ROM.       Posture/Postural Control   Posture/Postural Control  Postural limitations    Postural Limitations  Rounded Shoulders;Forward head;Flexed trunk;Increased thoracic kyphosis      ROM / Strength   AROM / PROM / Strength  AROM;Strength      AROM   Overall AROM   Deficits    Overall AROM Comments  Pt demonstrates observable deficits to bilateral hip ER ROM when assessed in sitting during heel to shin test for coordination      Strength   Overall Strength  Deficits    Overall Strength Comments  Pt demonstrates increased weakness to R LE> L LE. L LE assessed to be 4/5 when grossly assessed in sitting via MMT. Pt demonstrates 3+/5 R hip flexion with all other muscle groups assessed to  be 4/5 on the right side.       Transfers   Transfers  Sit to Stand;Stand to [employer] Transfers    Sit to Stand  5: Supervision    Five time sit to stand comments   Pt able to perform 5x STS from standard height chair with no  UE assist in 15.35 seconds indicating high fall risk potential     Stand to Sit  5: Supervision    Stand Pivot Transfers  5: Supervision      Ambulation/Gait   Ambulation/Gait  Yes    Ambulation/Gait Assistance  5: Supervision;4: Min guard    Ambulation/Gait Assistance Details  Pt performs x2 ambulation trials with and without his RW. Pt demonstrates flexed posture during ambulation trial with his RW. Pt performs x1 ambulation trial with no AD demonstrating wide BOS, slow cadence, and dec stride length. Therapist provided min guard during ambulation trial with no AD.     Ambulation Distance (Feet)  32.8 Feet   2nd trial: 25 feet with no AD   Assistive device  Rolling walker;None    Gait Pattern  Step-through pattern;Decreased arm swing - right;Decreased arm swing - left;Decreased step length - right;Decreased step length - left;Decreased stride length;Decreased dorsiflexion - right;Decreased dorsiflexion - left;Decreased trunk rotation;Wide base of support;Trunk flexed    Ambulation Surface  Level;Indoor    Gait velocity  2.25 ft/sec with RW indicative of limited community ambulation       Standardized Balance Assessment   Standardized Balance Assessment  10 meter walk test;Five Times Sit to Stand    Five times sit to stand comments   15.35    10 Meter Walk  2.25 ft/sec with RW                Objective measurements completed on examination: See above findings.              PT Education - 04/21/18 1555    Education Details  Therapist provided education regarding clinical findings of inital evaluation assessments, performance of ankle pumps for edema reduction, and POC moving forward.     Person(s) Educated  Patient    Methods   Explanation    Comprehension  Verbalized understanding       PT Short Term Goals - 04/21/18 1558      PT SHORT TERM GOAL #1   Title  Pt will participate in initiation of HEP for improvement with strength, balance, and functional mobility     Time  4    Period  Weeks    Status  New    Target Date  05/21/18      PT SHORT TERM GOAL #2   Title  Pt will participate in [place] assessment for baseline fall risk potential     Time  4    Period  Weeks    Status  New    Target Date  05/21/18      PT SHORT TERM GOAL #3   Title  Pt will improve gait speed using LRAD to >/= 2.55 ft/sec indicative of improvement in functional mobility and reduction in fall risk potential     Baseline  10/24: 2.25 ft/sec indicative of limited community ambulator with patient using RW     Time  4    Period  Weeks    Status  New    Target Date  05/21/18      PT SHORT TERM GOAL #4   Title  Pt will perform 5x STS from standard height chair with no AD in </= 13 seconds indicating reduction in fall risk potential and improvement in LE power.     Baseline  10/24; 15.35 seconds from standard height chair with no UE assist     Time  4    Period  Weeks  Status  New    Target Date  05/21/18      PT SHORT TERM GOAL #5   Title  Pt will ambulate 500 feet outdoors with supervision using LRAD navigating uneven surfaces, curbs, ramps, and demonstrate the ability to vary gait speed to improve safety with community ambulation.     Baseline  10/24: Pt reports experiencing fall while attempting to negotiate a curb     Time  4    Period  Weeks    Status  New    Target Date  05/21/18        PT Long Term Goals - 04/21/18 1605      PT LONG TERM GOAL #1   Title  Pt will report independence and compliance with strengthening and balance HEP to improve functional mobility.    Time  8    Period  Weeks    Status  New    Target Date  06/20/18      PT LONG TERM GOAL #2   Title  Pt will improve Berg score by 8 points  indicating reduction in fall risk potential     Time  8    Period  Weeks    Status  New    Target Date  06/20/18      PT LONG TERM GOAL #3   Title  Pt will improve gait speed with LRAD to >/= 2.85 ft/sec indicative of community ambulation     Time  8    Period  Weeks    Status  New    Target Date  06/20/18      PT LONG TERM GOAL #4   Title  Pt will perform 1000 feet outdoors with LRAD and Mod I navigating uneven terrain, curbs, ramps, and demonstrate the ability to vary gait speed to improve safety with community ambulation.     Time  8    Period  Weeks    Status  New    Target Date  06/20/18             Plan - 04/21/18 1608    Clinical Impression Statement  Pt is a 84 year old male referred to Neuro OPPT for evaluation of unsteady gait. Pt's PMH is significant for the following: Stroke, Osteoporosis, Atrial Fibrillation, Bipolar Disorder, Tremor, Renal Insufficiency, Polycythemia, HTN, paroxysmal atrial fibrillation. The following deficits were noted during pt's exam: Pt demonstrates R LE weakness> L LE weakness when grossly assessed via MMT in sitting. Pt demonstrates intact sensation bilaterally and difficulty performing heel to shin coordination testing 2/2 hip external rotation deficits bilaterally. Therapist notes observable pitting edema to bilateral ankles recommending patient follow up with his physician. Patient demonstrates observable L LE resting tremor in sitting position. Pt's gait speed of 2.25 ft/sec with his RW is indicative of limited community ambulation. Pt's 5x STS time of 15.37 seconds from standard height chair with no upper extremity assist indicates he has a high fall risk potential. Pt would benefit from skilled PT to address these impairments and functional limitations to maximize functional mobility independence and reduce falls risk.     History and Personal Factors relevant to plan of care:  normal pressure hydrocephalus, DDD, h/o thalamic lacunar Stroke,  Osteoporosis, Atrial Fibrillation, Bipolar Disorder - long term use of Lithium, Tremor, Renal Insufficiency, Polycythemia, HTN, paroxysmal atrial fibrillation. Both patient and wife do not drive and rely on in house transportation.     Clinical Presentation  Evolving    Clinical Presentation  due to:  normal pressure hydrocephalus, DDD, h/o thalamic lacunar Stroke, Osteoporosis, Atrial Fibrillation, Bipolar Disorder - long term use of Lithium, Tremor, Renal Insufficiency, Polycythemia, HTN, paroxysmal atrial fibrillation. Both patient and wife do not drive and rely on in house transportation.     Clinical Decision Making  Moderate    Rehab Potential  Good    Clinical Impairments Affecting Rehab Potential  unspecified diagnosis for LE shaking leading to balance instability and subsequent falls.     PT Frequency  Other (comment)   2x/week x 4, 1x/week x 4   PT Duration  8 weeks   2x wk/4 wks then reducing to 1x wk/4 wks    PT Treatment/Interventions  ADLs/Self Care Home Management;Gait training;Neuromuscular re-education;Passive range of motion;Functional mobility training;Stair training;Therapeutic activities;Patient/family education;Therapeutic exercise;DME Instruction;Balance training;Aquatic Therapy    PT Next Visit Plan   berg balance assessment, strengthening and balance HEP, begin balance training     PT Home Exercise Plan  tbd     Consulted and Agree with Plan of Care  Patient   Wife remained in lobby area during session      Patient will benefit from skilled therapeutic intervention in order to improve the following deficits and impairments:  Abnormal gait, Decreased mobility, Decreased range of motion, Decreased strength, Decreased balance, Increased edema, Decreased coordination, Difficulty walking  Visit Diagnosis: Muscle weakness (generalized)  Unsteadiness on feet  Other abnormalities of gait and mobility  Repeated falls  Other lack of coordination     Problem  List Patient Active Problem List   Diagnosis Date Noted  . Bipolar disorder (HCC) 02/17/2018  . Atrial fibrillation (HCC) 02/17/2018  . Gait instability 02/16/2018  . Tremor 02/16/2018  . Renal insufficiency 02/16/2018  . Polycythemia 02/16/2018  . Hypertension 02/16/2018  . AF (paroxysmal atrial fibrillation) (HCC) 02/16/2018  . Atrial fibrillation with RVR (HCC) 02/16/2018    Carney Living, SPT 04/21/2018, 8:13 PM  [facility] [address] [place], [place], [address] Phone: [phone]   Fax:  [phone]  Name: Sean Bishop MRN: [id number] Date of Birth: July 07, 1932

## 2018-04-22 DIAGNOSIS — M79671 Pain in right foot: Secondary | ICD-10-CM | POA: Diagnosis not present

## 2018-04-22 DIAGNOSIS — B351 Tinea unguium: Secondary | ICD-10-CM | POA: Diagnosis not present

## 2018-04-22 DIAGNOSIS — L84 Corns and callosities: Secondary | ICD-10-CM | POA: Diagnosis not present

## 2018-04-22 DIAGNOSIS — Q6689 Other  specified congenital deformities of feet: Secondary | ICD-10-CM | POA: Diagnosis not present

## 2018-04-22 DIAGNOSIS — E291 Testicular hypofunction: Secondary | ICD-10-CM | POA: Diagnosis not present

## 2018-05-03 ENCOUNTER — Encounter: Payer: Self-pay | Admitting: Physical Therapy

## 2018-05-03 ENCOUNTER — Ambulatory Visit: Payer: Medicare Other | Attending: Family Medicine | Admitting: Physical Therapy

## 2018-05-03 DIAGNOSIS — M6281 Muscle weakness (generalized): Secondary | ICD-10-CM | POA: Diagnosis not present

## 2018-05-03 DIAGNOSIS — R296 Repeated falls: Secondary | ICD-10-CM | POA: Diagnosis not present

## 2018-05-03 DIAGNOSIS — R2689 Other abnormalities of gait and mobility: Secondary | ICD-10-CM | POA: Diagnosis not present

## 2018-05-03 DIAGNOSIS — R278 Other lack of coordination: Secondary | ICD-10-CM | POA: Insufficient documentation

## 2018-05-03 DIAGNOSIS — R2681 Unsteadiness on feet: Secondary | ICD-10-CM | POA: Diagnosis not present

## 2018-05-03 NOTE — Therapy (Signed)
[facility] [address] [place], [place], [address] Phone: [phone]   Fax:  [phone]  Physical Therapy Treatment  Patient Details  Name: Sean Bishop MRN: [id number] Date of Birth: 05-12-1933 Referring Provider (PT): Dr. Juluis Rainier    Encounter Date: 05/03/2018  PT End of Session - 05/03/18 1108    Visit Number  2    Number of Visits  13    Date for PT Re-Evaluation  06/20/18    Authorization Type  BCBS Medicare  - 10th visit PN    PT Start Time  1102    PT Stop Time  1145    PT Time Calculation (min)  43 min    Activity Tolerance  Patient tolerated treatment well    Behavior During Therapy  [facility] for tasks assessed/performed       Past Medical History:  Diagnosis Date  . Atrial fibrillation (HCC)   . Bipolar 1 disorder (HCC)   . DJD (degenerative joint disease)   . Hypertension   . Mitral regurgitation    mild   . Renal insufficiency   . Stroke (HCC)   . Tremors of nervous system 01/2018    Past Surgical History:  Procedure Laterality Date  . SHOULDER SURGERY      There were no vitals filed for this visit.  Subjective Assessment - 05/03/18 1106    Subjective  was going somewhere? where he was given exercises - has not been doing these. no recent falls.     Pertinent History  normal pressure hydrocephalus, DDD, h/o thalamic lacunar Stroke, Osteoporosis, Atrial Fibrillation, Bipolar Disorder - long term use of Lithium, Tremor, Renal Insufficiency, Polycythemia, HTN, paroxysmal atrial fibrillation    Diagnostic tests  MRI    Patient Stated Goals  Regain motor skills such as trusting in his balance with improved overall stability. Improvement in confidence with dec reliance on walker for stability.     Currently in Pain?  No/denies                       [facility] Adult PT Treatment/Exercise - 05/03/18 1109      Standardized Balance Assessment   Standardized Balance Assessment   Berg Balance Test      Berg Balance Test   Sit to Stand  Able to stand without using hands and stabilize independently    Standing Unsupported  Able to stand safely 2 minutes    Sitting with Back Unsupported but Feet Supported on Floor or Stool  Able to sit safely and securely 2 minutes    Stand to Sit  Controls descent by using hands    Transfers  Able to transfer safely, minor use of hands    Standing Unsupported with Eyes Closed  Able to stand 10 seconds with supervision    Standing Ubsupported with Feet Together  Able to place feet together independently and stand for 1 minute with supervision    From Standing, Reach Forward with Outstretched Arm  Can reach forward >5 cm safely (2")    From Standing Position, Pick up Object from Floor  Unable to pick up and needs supervision    From Standing Position, Turn to Look Behind Over each Shoulder  Turn sideways only but maintains balance    Turn 360 Degrees  Needs close supervision or verbal cueing    Standing Unsupported, Alternately Place Feet on Step/Stool  Able to complete >2 steps/needs minimal assist    Standing  Unsupported, One Foot in Front  Able to take small step independently and hold 30 seconds    Standing on One Leg  Unable to try or needs assist to prevent fall    Total Score  34      Exercises   Exercises  Knee/Hip      Knee/Hip Exercises: Standing   Heel Raises  Both;15 reps   B UE support   Hip Abduction  Stengthening;Both;15 reps;Knee straight    Abduction Limitations  B UE support    Other Standing Knee Exercises  alternating high knee marching at RW x 12 reps      Knee/Hip Exercises: Seated   Sit to Sand  10 reps;without UE support          Balance Exercises - 05/03/18 1253      Balance Exercises: Standing   Standing Eyes Opened  Narrow base of support (BOS);2 reps;30 secs   progression to horizontal head turns         PT Short Term Goals - 04/21/18 1558      PT SHORT TERM GOAL #1   Title  Pt will  participate in initiation of HEP for improvement with strength, balance, and functional mobility     Time  4    Period  Weeks    Status  New    Target Date  05/21/18      PT SHORT TERM GOAL #2   Title  Pt will participate in [place] assessment for baseline fall risk potential     Time  4    Period  Weeks    Status  New    Target Date  05/21/18      PT SHORT TERM GOAL #3   Title  Pt will improve gait speed using LRAD to >/= 2.55 ft/sec indicative of improvement in functional mobility and reduction in fall risk potential     Baseline  10/24: 2.25 ft/sec indicative of limited community ambulator with patient using RW     Time  4    Period  Weeks    Status  New    Target Date  05/21/18      PT SHORT TERM GOAL #4   Title  Pt will perform 5x STS from standard height chair with no AD in </= 13 seconds indicating reduction in fall risk potential and improvement in LE power.     Baseline  10/24; 15.35 seconds from standard height chair with no UE assist     Time  4    Period  Weeks    Status  New    Target Date  05/21/18      PT SHORT TERM GOAL #5   Title  Pt will ambulate 500 feet outdoors with supervision using LRAD navigating uneven surfaces, curbs, ramps, and demonstrate the ability to vary gait speed to improve safety with community ambulation.     Baseline  10/24: Pt reports experiencing fall while attempting to negotiate a curb     Time  4    Period  Weeks    Status  New    Target Date  05/21/18        PT Long Term Goals - 04/21/18 1605      PT LONG TERM GOAL #1   Title  Pt will report independence and compliance with strengthening and balance HEP to improve functional mobility.    Time  8    Period  Weeks    Status  New  Target Date  06/20/18      PT LONG TERM GOAL #2   Title  Pt will improve Berg score by 8 points indicating reduction in fall risk potential     Time  8    Period  Weeks    Status  New    Target Date  06/20/18      PT LONG TERM GOAL #3   Title   Pt will improve gait speed with LRAD to >/= 2.85 ft/sec indicative of community ambulation     Time  8    Period  Weeks    Status  New    Target Date  06/20/18      PT LONG TERM GOAL #4   Title  Pt will perform 1000 feet outdoors with LRAD and Mod I navigating uneven terrain, curbs, ramps, and demonstrate the ability to vary gait speed to improve safety with community ambulation.     Time  8    Period  Weeks    Status  New    Target Date  06/20/18            Plan - 05/03/18 1254    Clinical Impression Statement  PT session today focusing on assessing Berg with patient scoring 34/56 demonstrating high fall risk. Remainder of session focusing on establishing HEP for gentle strengthening and balance. Does require B UE support as well as consistent verbal cueing for form and control of movement throughout session. Emphasized need for UE support at home to prevent falls. Will continue to progress towards LTG's.     Rehab Potential  Good    Clinical Impairments Affecting Rehab Potential  unspecified diagnosis for LE shaking leading to balance instability and subsequent falls.     PT Frequency  Other (comment)   2x/week x 4, 1x/week x 4   PT Duration  8 weeks   2x wk/4 wks then reducing to 1x wk/4 wks    PT Treatment/Interventions  ADLs/Self Care Home Management;Gait training;Neuromuscular re-education;Passive range of motion;Functional mobility training;Stair training;Therapeutic activities;Patient/family education;Therapeutic exercise;DME Instruction;Balance training;Aquatic Therapy    PT Next Visit Plan   berg balance assessment, strengthening and balance HEP, begin balance training     PT Home Exercise Plan  tbd     Consulted and Agree with Plan of Care  Patient   Wife remained in lobby area during session      Patient will benefit from skilled therapeutic intervention in order to improve the following deficits and impairments:  Abnormal gait, Decreased mobility, Decreased range of  motion, Decreased strength, Decreased balance, Increased edema, Decreased coordination, Difficulty walking  Visit Diagnosis: Muscle weakness (generalized)  Unsteadiness on feet  Other abnormalities of gait and mobility  Repeated falls  Other lack of coordination     Problem List Patient Active Problem List   Diagnosis Date Noted  . Bipolar disorder (HCC) 02/17/2018  . Atrial fibrillation (HCC) 02/17/2018  . Gait instability 02/16/2018  . Tremor 02/16/2018  . Renal insufficiency 02/16/2018  . Polycythemia 02/16/2018  . Hypertension 02/16/2018  . AF (paroxysmal atrial fibrillation) (HCC) 02/16/2018  . Atrial fibrillation with RVR (HCC) 02/16/2018     Kipp Laurence, PT, DPT Supplemental Physical Therapist 05/03/18 12:58 PM Pager: [phone] Office: [phone]  [facility] [address] [place], [place], [address] Phone: [phone]   Fax:  [phone]  Name: Sean Bishop MRN: [id number] Date of Birth: 1932/09/27

## 2018-05-04 ENCOUNTER — Encounter: Payer: Self-pay | Admitting: Physical Therapy

## 2018-05-04 ENCOUNTER — Ambulatory Visit: Payer: Medicare Other | Admitting: Physical Therapy

## 2018-05-04 DIAGNOSIS — R296 Repeated falls: Secondary | ICD-10-CM

## 2018-05-04 DIAGNOSIS — R2689 Other abnormalities of gait and mobility: Secondary | ICD-10-CM

## 2018-05-04 DIAGNOSIS — M6281 Muscle weakness (generalized): Secondary | ICD-10-CM

## 2018-05-04 DIAGNOSIS — R278 Other lack of coordination: Secondary | ICD-10-CM | POA: Diagnosis not present

## 2018-05-04 DIAGNOSIS — R2681 Unsteadiness on feet: Secondary | ICD-10-CM

## 2018-05-04 NOTE — Therapy (Signed)
[facility] [address] [place], [place], [address] Phone: [phone]   Fax:  [phone]  Physical Therapy Treatment  Patient Details  Name: Sean Bishop MRN: [id number] Date of Birth: Oct 04, 1932 Referring Provider (PT): Dr. Juluis Rainier    Encounter Date: 05/04/2018  PT End of Session - 05/04/18 1301    Visit Number  3    Number of Visits  13    Date for PT Re-Evaluation  06/20/18    Authorization Type  BC[employer] Medicare  - 10th visit PN    PT Start Time  1017    PT Stop Time  1101    PT Time Calculation (min)  44 min    Activity Tolerance  Patient tolerated treatment well    Behavior During Therapy  [facility] for tasks assessed/performed       Past Medical History:  Diagnosis Date  . Atrial fibrillation (HCC)   . Bipolar 1 disorder (HCC)   . DJD (degenerative joint disease)   . Hypertension   . Mitral regurgitation    mild   . Renal insufficiency   . Stroke (HCC)   . Tremors of nervous system 01/2018    Past Surgical History:  Procedure Laterality Date  . SHOULDER SURGERY      There were no vitals filed for this visit.  Subjective Assessment - 05/04/18 1022    Subjective  Was fatigued after session yesterday.  Did not get to do the exercises last night but did them this morning.  With certain exercises he gets the sensation that he is falling forwards.      Pertinent History  normal pressure hydrocephalus, DDD, h/o thalamic lacunar Stroke, Osteoporosis, Atrial Fibrillation, Bipolar Disorder - long term use of Lithium, Tremor, Renal Insufficiency, Polycythemia, HTN, paroxysmal atrial fibrillation    Diagnostic tests  MRI    Patient Stated Goals  Regain motor skills such as trusting in his balance with improved overall stability. Improvement in confidence with dec reliance on walker for stability.     Currently in Pain?  No/denies      Reviewed patient's HEP and added exercises below with focus on safe  performance and most effective technique.  Access Code: [email or web address]  URL: [email or web address]  Date: 05/04/2018  Prepared by: Bufford Lope   Exercises  Sit to Stand with Arms Crossed - 10 reps - 2 sets - 1x daily - 7x weekly  Heel rises with counter support - 10 reps - 2 sets - 1x daily - 7x weekly  Toe Raises with Counter Support - 10 reps - 3 sets - 1x daily - 7x weekly  Standing Hip Abduction with Counter Support - 10 reps - 2 sets - 1x daily - 7x weekly  Romberg Stance - 3-5 reps - 1 sets - 30 hold - 1x daily - 7x weekly  Single Leg Stance with Support - 5 reps - 10 second hold - 1x daily - 5x weekly  Standing Hip Extension with Counter Support - 10 reps - 3 sets - 1x daily - 7x weekly       PT Education - 05/04/18 1301    Education Details  Adjusted HEP, discussed with wife importance of assisting pt with HEP due to memory deficits    Person(s) Educated  Patient;Spouse    Methods  Explanation;Demonstration;Handout    Comprehension  Verbalized understanding;Returned demonstration       PT Short Term Goals - 05/04/18 1302      PT SHORT  TERM GOAL #1   Title  Pt will participate in initiation of HEP for improvement with strength, balance, and functional mobility     Time  4    Period  Weeks    Status  New    Target Date  05/21/18      PT SHORT TERM GOAL #2   Title  Pt will participate in Berg assessment for baseline fall risk potential     Time  4    Period  Weeks    Status  Achieved      PT SHORT TERM GOAL #3   Title  Pt will improve gait speed using LRAD to >/= 2.55 ft/sec indicative of improvement in functional mobility and reduction in fall risk potential     Baseline  10/24: 2.25 ft/sec indicative of limited community ambulator with patient using RW     Time  4    Period  Weeks    Status  New    Target Date  05/21/18      PT SHORT TERM GOAL #4   Title  Pt will perform 5x STS from standard height chair with no AD in </= 13 seconds indicating  reduction in fall risk potential and improvement in LE power.     Baseline  10/24; 15.35 seconds from standard height chair with no UE assist     Time  4    Period  Weeks    Status  New    Target Date  05/21/18      PT SHORT TERM GOAL #5   Title  Pt will ambulate 500 feet outdoors with supervision using LRAD navigating uneven surfaces, curbs, ramps, and demonstrate the ability to vary gait speed to improve safety with community ambulation.     Baseline  10/24: Pt reports experiencing fall while attempting to negotiate a curb     Time  4    Period  Weeks    Status  New    Target Date  05/21/18        PT Long Term Goals - 05/04/18 1303      PT LONG TERM GOAL #1   Title  Pt will report independence and compliance with strengthening and balance HEP to improve functional mobility.    Time  8    Period  Weeks    Status  New    Target Date  06/20/18      PT LONG TERM GOAL #2   Title  Pt will improve Berg score by 8 points indicating reduction in fall risk potential     Baseline  34/56    Time  8    Period  Weeks    Status  New    Target Date  06/20/18      PT LONG TERM GOAL #3   Title  Pt will improve gait speed with LRAD to >/= 2.85 ft/sec indicative of community ambulation     Time  8    Period  Weeks    Status  New    Target Date  06/20/18      PT LONG TERM GOAL #4   Title  Pt will perform 1000 feet outdoors with LRAD and Mod I navigating uneven terrain, curbs, ramps, and demonstrate the ability to vary gait speed to improve safety with community ambulation.     Time  8    Period  Weeks    Status  New    Target Date  06/20/18  Plan - 05/04/18 1303    Clinical Impression Statement  Pt reports performing HEP this morning but performed on his own; reviewed HEP to determine pt safety and recall of exercises.  Due to pt's tendency to compensate (due to fear of falling forwards) as well as pt's significant short term memory impairments, revised technique for  specific standing exercises, added other ankle and hip strengthening exercises to HEP and discussed with wife importance of her performing with pt in order to ensure safety, accurate technique and effectiveness of exercises.  Pt's wife verbalized understanding.    Rehab Potential  Good    Clinical Impairments Affecting Rehab Potential  unspecified diagnosis for LE shaking leading to balance instability and subsequent falls.     PT Frequency  Other (comment)   2x/week x 4, 1x/week x 4   PT Duration  8 weeks   2x wk/4 wks then reducing to 1x wk/4 wks    PT Treatment/Interventions  ADLs/Self Care Home Management;Gait training;Neuromuscular re-education;Passive range of motion;Functional mobility training;Stair training;Therapeutic activities;Patient/family education;Therapeutic exercise;DME Instruction;Balance training;Aquatic Therapy    PT Next Visit Plan  dynamic balance focusing on ankle/hip and step strategies, rockerboard, gait with increased foot clearance and heel strike    Consulted and Agree with Plan of Care  Patient   Wife remained in lobby area during session      Patient will benefit from skilled therapeutic intervention in order to improve the following deficits and impairments:  Abnormal gait, Decreased mobility, Decreased range of motion, Decreased strength, Decreased balance, Increased edema, Decreased coordination, Difficulty walking  Visit Diagnosis: Muscle weakness (generalized)  Unsteadiness on feet  Other abnormalities of gait and mobility  Repeated falls  Other lack of coordination     Problem List Patient Active Problem List   Diagnosis Date Noted  . Bipolar disorder (HCC) 02/17/2018  . Atrial fibrillation (HCC) 02/17/2018  . Gait instability 02/16/2018  . Tremor 02/16/2018  . Renal insufficiency 02/16/2018  . Polycythemia 02/16/2018  . Hypertension 02/16/2018  . AF (paroxysmal atrial fibrillation) (HCC) 02/16/2018  . Atrial fibrillation with RVR (HCC)  02/16/2018    Dierdre Highman, PT, DPT 05/04/18    1:12 PM    [place] [facility] [address] [place], [place], [address] Phone: [phone]   Fax:  [phone]  Name: Sean Bishop MRN: [id number] Date of Birth: 1933/02/03

## 2018-05-04 NOTE — Patient Instructions (Signed)
Access Code: [email or web address]  URL: [email or web address]  Date: 05/04/2018  Prepared by:     Exercises  Sit to Stand with Arms Crossed - 10 reps - 2 sets - 1x daily - 7x weekly  Heel rises with counter support - 10 reps - 2 sets - 1x daily - 7x weekly  Toe Raises with Counter Support - 10 reps - 3 sets - 1x daily - 7x weekly  Standing Hip Abduction with Counter Support - 10 reps - 2 sets - 1x daily - 7x weekly  Romberg Stance - 3-5 reps - 1 sets - 30 hold - 1x daily - 7x weekly  Single Leg Stance with Support - 5 reps - 10 second hold - 1x daily - 5x weekly  Standing Hip Extension with Counter Support - 10 reps - 3 sets - 1x daily - 7x weekly   

## 2018-05-10 ENCOUNTER — Encounter: Payer: Self-pay | Admitting: Physical Therapy

## 2018-05-10 ENCOUNTER — Ambulatory Visit: Payer: Medicare Other | Admitting: Physical Therapy

## 2018-05-10 DIAGNOSIS — R296 Repeated falls: Secondary | ICD-10-CM | POA: Diagnosis not present

## 2018-05-10 DIAGNOSIS — R278 Other lack of coordination: Secondary | ICD-10-CM

## 2018-05-10 DIAGNOSIS — M6281 Muscle weakness (generalized): Secondary | ICD-10-CM

## 2018-05-10 DIAGNOSIS — R2689 Other abnormalities of gait and mobility: Secondary | ICD-10-CM

## 2018-05-10 DIAGNOSIS — R2681 Unsteadiness on feet: Secondary | ICD-10-CM

## 2018-05-10 NOTE — Therapy (Signed)
[facility] [address] [place], [place], [address] Phone: [phone]   Fax:  [phone]  Physical Therapy Treatment  Patient Details  Name: Sean Bishop MRN: [id number] Date of Birth: 1933-01-02 Referring Provider (PT): Dr. Juluis Rainier    Encounter Date: 05/10/2018  PT End of Session - 05/10/18 1115    Visit Number  4    Number of Visits  13    Date for PT Re-Evaluation  06/20/18    Authorization Type  BCBS Medicare  - 10th visit PN    PT Start Time  1108   pt late   PT Stop Time  1146    PT Time Calculation (min)  38 min    Activity Tolerance  Patient tolerated treatment well    Behavior During Therapy  [facility] for tasks assessed/performed       Past Medical History:  Diagnosis Date  . Atrial fibrillation (HCC)   . Bipolar 1 disorder (HCC)   . DJD (degenerative joint disease)   . Hypertension   . Mitral regurgitation    mild   . Renal insufficiency   . Stroke (HCC)   . Tremors of nervous system 01/2018    Past Surgical History:  Procedure Laterality Date  . SHOULDER SURGERY      There were no vitals filed for this visit.  Subjective Assessment - 05/10/18 1110    Subjective  was having some trouble with sit to stand exercise; thinks it may be the chair he sits in    Pertinent History  normal pressure hydrocephalus, DDD, h/o thalamic lacunar Stroke, Osteoporosis, Atrial Fibrillation, Bipolar Disorder - long term use of Lithium, Tremor, Renal Insufficiency, Polycythemia, HTN, paroxysmal atrial fibrillation    Diagnostic tests  MRI    Patient Stated Goals  Regain motor skills such as trusting in his balance with improved overall stability. Improvement in confidence with dec reliance on walker for stability.     Currently in Pain?  No/denies    Multiple Pain Sites  No                            Balance Exercises - 05/10/18 1256      Balance Exercises: Standing   Standing,  One Foot on a Step  Eyes open;2 inch   foot up on AirEx - patient very fearful   Rockerboard  Anterior/posterior;Lateral   working on weight shifting - heavy verbal cueing   Balance Beam  blue beam: fwd/bwd stepping to begin education on stepping strategy. Hevay use of UE at // bars; some confusion with this.     Other Standing Exercises  toe taps to 2" AirEx - patient very fearful of falls; some confusion on sequencing.          PT Short Term Goals - 05/04/18 1302      PT SHORT TERM GOAL #1   Title  Pt will participate in initiation of HEP for improvement with strength, balance, and functional mobility     Time  4    Period  Weeks    Status  New    Target Date  05/21/18      PT SHORT TERM GOAL #2   Title  Pt will participate in Berg assessment for baseline fall risk potential     Time  4    Period  Weeks    Status  Achieved      PT SHORT  TERM GOAL #3   Title  Pt will improve gait speed using LRAD to >/= 2.55 ft/sec indicative of improvement in functional mobility and reduction in fall risk potential     Baseline  10/24: 2.25 ft/sec indicative of limited community ambulator with patient using RW     Time  4    Period  Weeks    Status  New    Target Date  05/21/18      PT SHORT TERM GOAL #4   Title  Pt will perform 5x STS from standard height chair with no AD in </= 13 seconds indicating reduction in fall risk potential and improvement in LE power.     Baseline  10/24; 15.35 seconds from standard height chair with no UE assist     Time  4    Period  Weeks    Status  New    Target Date  05/21/18      PT SHORT TERM GOAL #5   Title  Pt will ambulate 500 feet outdoors with supervision using LRAD navigating uneven surfaces, curbs, ramps, and demonstrate the ability to vary gait speed to improve safety with community ambulation.     Baseline  10/24: Pt reports experiencing fall while attempting to negotiate a curb     Time  4    Period  Weeks    Status  New    Target Date   05/21/18        PT Long Term Goals - 05/04/18 1303      PT LONG TERM GOAL #1   Title  Pt will report independence and compliance with strengthening and balance HEP to improve functional mobility.    Time  8    Period  Weeks    Status  New    Target Date  06/20/18      PT LONG TERM GOAL #2   Title  Pt will improve Berg score by 8 points indicating reduction in fall risk potential     Baseline  34/56    Time  8    Period  Weeks    Status  New    Target Date  06/20/18      PT LONG TERM GOAL #3   Title  Pt will improve gait speed with LRAD to >/= 2.85 ft/sec indicative of community ambulation     Time  8    Period  Weeks    Status  New    Target Date  06/20/18      PT LONG TERM GOAL #4   Title  Pt will perform 1000 feet outdoors with LRAD and Mod I navigating uneven terrain, curbs, ramps, and demonstrate the ability to vary gait speed to improve safety with community ambulation.     Time  8    Period  Weeks    Status  New    Target Date  06/20/18            Plan - 05/10/18 1258    Clinical Impression Statement  Some confusion in session today. Upon asking patient orientation questions, patient stating today was Monday, November 5th, 2020. Some confusion with tasks in session today with patient tendency to cover this with humor. Session focusing on promoting weight shifting and begin education on stepping strategy. Requires B UE support at // bars for comfort and confidence. Will continue to progress towards goals.     Rehab Potential  Good    Clinical Impairments Affecting Rehab Potential  unspecified  diagnosis for LE shaking leading to balance instability and subsequent falls.     PT Frequency  Other (comment)   2x/week x 4, 1x/week x 4   PT Duration  8 weeks   2x wk/4 wks then reducing to 1x wk/4 wks    PT Treatment/Interventions  ADLs/Self Care Home Management;Gait training;Neuromuscular re-education;Passive range of motion;Functional mobility training;Stair  training;Therapeutic activities;Patient/family education;Therapeutic exercise;DME Instruction;Balance training;Aquatic Therapy    PT Next Visit Plan  dynamic balance focusing on ankle/hip and step strategies, rockerboard, gait with increased foot clearance and heel strike    Consulted and Agree with Plan of Care  Patient   Wife remained in lobby area during session      Patient will benefit from skilled therapeutic intervention in order to improve the following deficits and impairments:  Abnormal gait, Decreased mobility, Decreased range of motion, Decreased strength, Decreased balance, Increased edema, Decreased coordination, Difficulty walking  Visit Diagnosis: Muscle weakness (generalized)  Unsteadiness on feet  Other abnormalities of gait and mobility  Repeated falls  Other lack of coordination     Problem List Patient Active Problem List   Diagnosis Date Noted  . Bipolar disorder (HCC) 02/17/2018  . Atrial fibrillation (HCC) 02/17/2018  . Gait instability 02/16/2018  . Tremor 02/16/2018  . Renal insufficiency 02/16/2018  . Polycythemia 02/16/2018  . Hypertension 02/16/2018  . AF (paroxysmal atrial fibrillation) (HCC) 02/16/2018  . Atrial fibrillation with RVR (HCC) 02/16/2018     Kipp Laurence, PT, DPT Supplemental Physical Therapist 05/10/18 1:03 PM Pager: [phone] Office: [phone]  [facility] [address] [place], [place], [address] Phone: [phone]   Fax:  [phone]  Name: RENARDO CHEATUM MRN: [id number] Date of Birth: Oct 26, 1932

## 2018-05-12 ENCOUNTER — Encounter: Payer: Self-pay | Admitting: Physical Therapy

## 2018-05-12 ENCOUNTER — Ambulatory Visit: Payer: Medicare Other | Admitting: Physical Therapy

## 2018-05-12 DIAGNOSIS — R278 Other lack of coordination: Secondary | ICD-10-CM

## 2018-05-12 DIAGNOSIS — M6281 Muscle weakness (generalized): Secondary | ICD-10-CM | POA: Diagnosis not present

## 2018-05-12 DIAGNOSIS — R2681 Unsteadiness on feet: Secondary | ICD-10-CM | POA: Diagnosis not present

## 2018-05-12 DIAGNOSIS — R2689 Other abnormalities of gait and mobility: Secondary | ICD-10-CM

## 2018-05-12 DIAGNOSIS — R296 Repeated falls: Secondary | ICD-10-CM | POA: Diagnosis not present

## 2018-05-12 NOTE — Therapy (Signed)
[facility] [address] [place], [place], [address] Phone: [phone]   Fax:  [phone]  Physical Therapy Treatment  Patient Details  Name: Sean Bishop MRN: [id number] Date of Birth: 11-Jan-1933 Referring Provider (PT): Dr. Juluis Rainier    Encounter Date: 05/12/2018  PT End of Session - 05/12/18 1236    Visit Number  5    Number of Visits  13    Date for PT Re-Evaluation  06/20/18    Authorization Type  [employer] Medicare  - 10th visit PN    PT Start Time  1020    PT Stop Time  1104    PT Time Calculation (min)  44 min    Activity Tolerance  Patient tolerated treatment well    Behavior During Therapy  [facility] for tasks assessed/performed       Past Medical History:  Diagnosis Date  . Atrial fibrillation (HCC)   . Bipolar 1 disorder (HCC)   . DJD (degenerative joint disease)   . Hypertension   . Mitral regurgitation    mild   . Renal insufficiency   . Stroke (HCC)   . Tremors of nervous system 01/2018    Past Surgical History:  Procedure Laterality Date  . SHOULDER SURGERY      There were no vitals filed for this visit.  Subjective Assessment - 05/12/18 1027    Subjective  Had been doing exercises most days and felt that his mobility was improving until yesterday.  Wasn't able to do exercises yesterday and he felt a big difference; reports that as the day progressed he felt less secure when walking and felt that he couldn't let go of support.  Denied other symptoms of headache, changes in vision or new onset weakness.    Pertinent History  normal pressure hydrocephalus, DDD, h/o thalamic lacunar Stroke, Osteoporosis, Atrial Fibrillation, Bipolar Disorder - long term use of Lithium, Tremor, Renal Insufficiency, Polycythemia, HTN, paroxysmal atrial fibrillation    Diagnostic tests  MRI    Patient Stated Goals  Regain motor skills such as trusting in his balance with improved overall stability. Improvement  in confidence with dec reliance on walker for stability.     Currently in Pain?  No/denies                       [facility] Adult PT Treatment/Exercise - 05/12/18 1035      Exercises   Exercises  Knee/Hip      Knee/Hip Exercises: Standing   Hip Abduction  Stengthening;Both    Abduction Limitations  side stepping at counter with blue theraband around ankles with and then without UE support    Lateral Step Up  Right;Left;1 set;10 reps;Hand Hold: 2;Step Height: 4"    Lateral Step Up Limitations  with verbal cues and facilitation of weight shift laterally and to push up with LE insteady of UE          Balance Exercises - 05/12/18 1234      Balance Exercises: Standing   Rockerboard  Anterior/posterior;EO;EC;10 reps;UE support          PT Short Term Goals - 05/04/18 1302      PT SHORT TERM GOAL #1   Title  Pt will participate in initiation of HEP for improvement with strength, balance, and functional mobility     Time  4    Period  Weeks    Status  New    Target Date  05/21/18  PT SHORT TERM GOAL #2   Title  Pt will participate in Berg assessment for baseline fall risk potential     Time  4    Period  Weeks    Status  Achieved      PT SHORT TERM GOAL #3   Title  Pt will improve gait speed using LRAD to >/= 2.55 ft/sec indicative of improvement in functional mobility and reduction in fall risk potential     Baseline  10/24: 2.25 ft/sec indicative of limited community ambulator with patient using RW     Time  4    Period  Weeks    Status  New    Target Date  05/21/18      PT SHORT TERM GOAL #4   Title  Pt will perform 5x STS from standard height chair with no AD in </= 13 seconds indicating reduction in fall risk potential and improvement in LE power.     Baseline  10/24; 15.35 seconds from standard height chair with no UE assist     Time  4    Period  Weeks    Status  New    Target Date  05/21/18      PT SHORT TERM GOAL #5   Title  Pt will  ambulate 500 feet outdoors with supervision using LRAD navigating uneven surfaces, curbs, ramps, and demonstrate the ability to vary gait speed to improve safety with community ambulation.     Baseline  10/24: Pt reports experiencing fall while attempting to negotiate a curb     Time  4    Period  Weeks    Status  New    Target Date  05/21/18        PT Long Term Goals - 05/04/18 1303      PT LONG TERM GOAL #1   Title  Pt will report independence and compliance with strengthening and balance HEP to improve functional mobility.    Time  8    Period  Weeks    Status  New    Target Date  06/20/18      PT LONG TERM GOAL #2   Title  Pt will improve Berg score by 8 points indicating reduction in fall risk potential     Baseline  34/56    Time  8    Period  Weeks    Status  New    Target Date  06/20/18      PT LONG TERM GOAL #3   Title  Pt will improve gait speed with LRAD to >/= 2.85 ft/sec indicative of community ambulation     Time  8    Period  Weeks    Status  New    Target Date  06/20/18      PT LONG TERM GOAL #4   Title  Pt will perform 1000 feet outdoors with LRAD and Mod I navigating uneven terrain, curbs, ramps, and demonstrate the ability to vary gait speed to improve safety with community ambulation.     Time  8    Period  Weeks    Status  New    Target Date  06/20/18            Plan - 05/12/18 1237    Clinical Impression Statement  Confusion and difficulty sequencing of tasks continues during today's session.  When performing dynamic LE strengthening exercises and balance exercises pt requires 100% verbal and visual cues to sequence exercise, perform repetitions and recall  exercise.  Pt will require ongoing supervision of wife to perform exercises consistently.  Will continue to progress towards LTG.    Rehab Potential  Good    Clinical Impairments Affecting Rehab Potential  unspecified diagnosis for LE shaking leading to balance instability and subsequent  falls.     PT Frequency  Other (comment)   2x/week x 4, 1x/week x 4   PT Duration  8 weeks   2x wk/4 wks then reducing to 1x wk/4 wks    PT Treatment/Interventions  ADLs/Self Care Home Management;Gait training;Neuromuscular re-education;Passive range of motion;Functional mobility training;Stair training;Therapeutic activities;Patient/family education;Therapeutic exercise;DME Instruction;Balance training;Aquatic Therapy    PT Next Visit Plan  dynamic balance focusing on ankle/hip and step strategies, rockerboard, gait with increased foot clearance and heel strike.  Try Power exercises.    Consulted and Agree with Plan of Care  Patient   Wife remained in lobby area during session      Patient will benefit from skilled therapeutic intervention in order to improve the following deficits and impairments:  Abnormal gait, Decreased mobility, Decreased range of motion, Decreased strength, Decreased balance, Increased edema, Decreased coordination, Difficulty walking  Visit Diagnosis: Muscle weakness (generalized)  Unsteadiness on feet  Other abnormalities of gait and mobility  Repeated falls  Other lack of coordination     Problem List Patient Active Problem List   Diagnosis Date Noted  . Bipolar disorder (HCC) 02/17/2018  . Atrial fibrillation (HCC) 02/17/2018  . Gait instability 02/16/2018  . Tremor 02/16/2018  . Renal insufficiency 02/16/2018  . Polycythemia 02/16/2018  . Hypertension 02/16/2018  . AF (paroxysmal atrial fibrillation) (HCC) 02/16/2018  . Atrial fibrillation with RVR (HCC) 02/16/2018    Dierdre Highman, PT, DPT 05/12/18    12:45 PM    [facility] [address] [place], [place], [address] Phone: [phone]   Fax:  [phone]  Name: Sean Bishop MRN: [id number] Date of Birth: 1932/08/19

## 2018-05-12 NOTE — Patient Instructions (Signed)
Access Code: [email or web address]  URL: [email or web address]  Date: 05/04/2018  Prepared by: Bufford LopeAudra Aubryanna Nesheim   Exercises  Sit to Stand with Arms Crossed - 10 reps - 2 sets - 1x daily - 7x weekly  Heel rises with counter support - 10 reps - 2 sets - 1x daily - 7x weekly  Toe Raises with Counter Support - 10 reps - 3 sets - 1x daily - 7x weekly  Standing Hip Abduction with Counter Support - 10 reps - 2 sets - 1x daily - 7x weekly  Romberg Stance - 3-5 reps - 1 sets - 30 hold - 1x daily - 7x weekly  Single Leg Stance with Support - 5 reps - 10 second hold - 1x daily - 5x weekly  Standing Hip Extension with Counter Support - 10 reps - 3 sets - 1x daily - 7x weekly

## 2018-05-13 DIAGNOSIS — E291 Testicular hypofunction: Secondary | ICD-10-CM | POA: Diagnosis not present

## 2018-05-17 ENCOUNTER — Ambulatory Visit: Payer: Medicare Other | Admitting: Physical Therapy

## 2018-05-17 DIAGNOSIS — R2681 Unsteadiness on feet: Secondary | ICD-10-CM

## 2018-05-17 DIAGNOSIS — R296 Repeated falls: Secondary | ICD-10-CM

## 2018-05-17 DIAGNOSIS — R2689 Other abnormalities of gait and mobility: Secondary | ICD-10-CM | POA: Diagnosis not present

## 2018-05-17 DIAGNOSIS — R278 Other lack of coordination: Secondary | ICD-10-CM | POA: Diagnosis not present

## 2018-05-17 DIAGNOSIS — M6281 Muscle weakness (generalized): Secondary | ICD-10-CM | POA: Diagnosis not present

## 2018-05-17 NOTE — Therapy (Signed)
[facility] [address] [place], [place], [address] Phone: [phone]   Fax:  [phone]  Physical Therapy Treatment  Patient Details  Name: Sean Bishop MRN: [id number] Date of Birth: 23-May-1933 Referring Provider (PT): Dr. Juluis Rainier    Encounter Date: 05/17/2018  PT End of Session - 05/17/18 1217    Visit Number  6    Number of Visits  13    Date for PT Re-Evaluation  06/20/18    Authorization Type  BCBS Medicare  - 10th visit PN    PT Start Time  1020    PT Stop Time  1104    PT Time Calculation (min)  44 min    Activity Tolerance  Patient tolerated treatment well    Behavior During Therapy  [facility] for tasks assessed/performed       Past Medical History:  Diagnosis Date  . Atrial fibrillation (HCC)   . Bipolar 1 disorder (HCC)   . DJD (degenerative joint disease)   . Hypertension   . Mitral regurgitation    mild   . Renal insufficiency   . Stroke (HCC)   . Tremors of nervous system 01/2018    Past Surgical History:  Procedure Laterality Date  . SHOULDER SURGERY      There were no vitals filed for this visit.  Subjective Assessment - 05/17/18 1210    Subjective  Pt relays his balance is his biggest concern    Pertinent History  normal pressure hydrocephalus, DDD, h/o thalamic lacunar Stroke, Osteoporosis, Atrial Fibrillation, Bipolar Disorder - long term use of Lithium, Tremor, Renal Insufficiency, Polycythemia, HTN, paroxysmal atrial fibrillation    Limitations  Standing;Walking    Patient Stated Goals  Regain motor skills such as trusting in his balance with improved overall stability. Improvement in confidence with dec reliance on walker for stability.     Currently in Pain?  No/denies                       [facility] Adult PT Treatment/Exercise - 05/17/18 0001      Ambulation/Gait   Ambulation/Gait  Yes    Ambulation/Gait Assistance  5: Supervision;4: Min guard    Ambulation/Gait Assistance Details  one lap 115 ft with RW, one lap without AD      High Level Balance   High Level Balance Comments  Foam perturbations 30 sec X 4, foam eyes open feet together, eyes closed with feet apart 30 sec X 3 ea, Foam H/T raises X 20 with UE support,rocker board with UE support X 30 ant-post and 30X lateral        Exercises   Exercises  Knee/Hip      Knee/Hip Exercises: Standing   Forward Step Up  Both;10 reps;Hand Hold: 2;Step Height: 6"      Knee/Hip Exercises: Seated   Sit to Sand  10 reps;without UE support               PT Short Term Goals - 05/04/18 1302      PT SHORT TERM GOAL #1   Title  Pt will participate in initiation of HEP for improvement with strength, balance, and functional mobility     Time  4    Period  Weeks    Status  New    Target Date  05/21/18      PT SHORT TERM GOAL #2   Title  Pt will participate in [place] assessment for baseline fall  risk potential     Time  4    Period  Weeks    Status  Achieved      PT SHORT TERM GOAL #3   Title  Pt will improve gait speed using LRAD to >/= 2.55 ft/sec indicative of improvement in functional mobility and reduction in fall risk potential     Baseline  10/24: 2.25 ft/sec indicative of limited community ambulator with patient using RW     Time  4    Period  Weeks    Status  New    Target Date  05/21/18      PT SHORT TERM GOAL #4   Title  Pt will perform 5x STS from standard height chair with no AD in </= 13 seconds indicating reduction in fall risk potential and improvement in LE power.     Baseline  10/24; 15.35 seconds from standard height chair with no UE assist     Time  4    Period  Weeks    Status  New    Target Date  05/21/18      PT SHORT TERM GOAL #5   Title  Pt will ambulate 500 feet outdoors with supervision using LRAD navigating uneven surfaces, curbs, ramps, and demonstrate the ability to vary gait speed to improve safety with community ambulation.     Baseline   10/24: Pt reports experiencing fall while attempting to negotiate a curb     Time  4    Period  Weeks    Status  New    Target Date  05/21/18        PT Long Term Goals - 05/04/18 1303      PT LONG TERM GOAL #1   Title  Pt will report independence and compliance with strengthening and balance HEP to improve functional mobility.    Time  8    Period  Weeks    Status  New    Target Date  06/20/18      PT LONG TERM GOAL #2   Title  Pt will improve Berg score by 8 points indicating reduction in fall risk potential     Baseline  34/56    Time  8    Period  Weeks    Status  New    Target Date  06/20/18      PT LONG TERM GOAL #3   Title  Pt will improve gait speed with LRAD to >/= 2.85 ft/sec indicative of community ambulation     Time  8    Period  Weeks    Status  New    Target Date  06/20/18      PT LONG TERM GOAL #4   Title  Pt will perform 1000 feet outdoors with LRAD and Mod I navigating uneven terrain, curbs, ramps, and demonstrate the ability to vary gait speed to improve safety with community ambulation.     Time  8    Period  Weeks    Status  New    Target Date  06/20/18            Plan - 05/17/18 1311    Clinical Impression Statement  Pt appeared to have improvments with confusion and sequencing tasks today as well as improved performance with sit to stands. Session focused on activity tolerance, LE strength, and balance today. PT will continue to progress as able in these areas.     Rehab Potential  Good    Clinical  Impairments Affecting Rehab Potential  unspecified diagnosis for LE shaking leading to balance instability and subsequent falls.     PT Treatment/Interventions  ADLs/Self Care Home Management;Gait training;Neuromuscular re-education;Passive range of motion;Functional mobility training;Stair training;Therapeutic activities;Patient/family education;Therapeutic exercise;DME Instruction;Balance training;Aquatic Therapy    PT Next Visit Plan  dynamic  balance focusing on ankle/hip and step strategies, rockerboard, gait with increased foot clearance and heel strike.  Try Power exercises.    Consulted and Agree with Plan of Care  Patient       Patient will benefit from skilled therapeutic intervention in order to improve the following deficits and impairments:  Abnormal gait, Decreased mobility, Decreased range of motion, Decreased strength, Decreased balance, Increased edema, Decreased coordination, Difficulty walking  Visit Diagnosis: Muscle weakness (generalized)  Unsteadiness on feet  Other abnormalities of gait and mobility  Repeated falls  Other lack of coordination     Problem List Patient Active Problem List   Diagnosis Date Noted  . Bipolar disorder (HCC) 02/17/2018  . Atrial fibrillation (HCC) 02/17/2018  . Gait instability 02/16/2018  . Tremor 02/16/2018  . Renal insufficiency 02/16/2018  . Polycythemia 02/16/2018  . Hypertension 02/16/2018  . AF (paroxysmal atrial fibrillation) (HCC) 02/16/2018  . Atrial fibrillation with RVR (HCC) 02/16/2018    April Manson, PT,DPT 05/17/2018, 1:14 PM  [facility] [address] [place], [place], [address] Phone: [phone]   Fax:  [phone]  Name: Sean Bishop MRN: [id number] Date of Birth: 1933/02/22

## 2018-05-19 ENCOUNTER — Ambulatory Visit: Payer: Medicare Other | Admitting: Physical Therapy

## 2018-05-19 ENCOUNTER — Encounter: Payer: Self-pay | Admitting: Physical Therapy

## 2018-05-19 DIAGNOSIS — R2689 Other abnormalities of gait and mobility: Secondary | ICD-10-CM

## 2018-05-19 DIAGNOSIS — R296 Repeated falls: Secondary | ICD-10-CM

## 2018-05-19 DIAGNOSIS — R2681 Unsteadiness on feet: Secondary | ICD-10-CM

## 2018-05-19 DIAGNOSIS — R278 Other lack of coordination: Secondary | ICD-10-CM | POA: Diagnosis not present

## 2018-05-19 DIAGNOSIS — M6281 Muscle weakness (generalized): Secondary | ICD-10-CM

## 2018-05-19 NOTE — Therapy (Signed)
[facility] [address] [place], [place], [address] Phone: [phone]   Fax:  [phone]  Physical Therapy Treatment  Patient Details  Name: Sean Bishop MRN: [id number] Date of Birth: 01-May-1933 Referring Provider (PT): Dr. Juluis Rainier    Encounter Date: 05/19/2018  PT End of Session - 05/19/18 2052    Visit Number  7    Number of Visits  13    Date for PT Re-Evaluation  06/20/18    Authorization Type  [employer] Medicare  - 10th visit PN    PT Start Time  1536    PT Stop Time  1620    PT Time Calculation (min)  44 min    Activity Tolerance  Patient tolerated treatment well    Behavior During Therapy  [facility] for tasks assessed/performed       Past Medical History:  Diagnosis Date  . Atrial fibrillation (HCC)   . Bipolar 1 disorder (HCC)   . DJD (degenerative joint disease)   . Hypertension   . Mitral regurgitation    mild   . Renal insufficiency   . Stroke (HCC)   . Tremors of nervous system 01/2018    Past Surgical History:  Procedure Laterality Date  . SHOULDER SURGERY      There were no vitals filed for this visit.  Subjective Assessment - 05/19/18 1538    Subjective  Pt reports is noticing some improvements but continues to report difficulty with balance.    Pertinent History  normal pressure hydrocephalus, DDD, h/o thalamic lacunar Stroke, Osteoporosis, Atrial Fibrillation, Bipolar Disorder - long term use of Lithium, Tremor, Renal Insufficiency, Polycythemia, HTN, paroxysmal atrial fibrillation    Limitations  Standing;Walking    Patient Stated Goals  Regain motor skills such as trusting in his balance with improved overall stability. Improvement in confidence with dec reliance on walker for stability.     Currently in Pain?  No/denies                       [facility] Adult PT Treatment/Exercise - 05/19/18 1604      Therapeutic Activites    Therapeutic Activities  Other  Therapeutic Activities    Other Therapeutic Activities  discussed what options are available for pt at ILF in work out room for exercise equipment and balance classes.  Pt has not explored options in the work out room or exercise classes.  Therapist recommended that pt and wife discuss what wellness and exercise routine they would be able to participate in at the ILF.  Pt states that they mainly focus on walking.  Discussed the importance of also incorporating balance and strengthening.  Pt stated, "well we are too old to run".  Continued to discuss options that may be available to pt at ILF but pt appeared to have confusion about what therapist was recommending and how walking would not directly address balance and strength impairments.  Will continue to discuss      Exercises   Exercises  Knee/Hip      Knee/Hip Exercises: Aerobic   Nustep  x 8 minutes at L5 resistance with bilat UE and LE for endurance training; HR afterwards: 60 bpm        PWR [facility]) - 05/19/18 1605    PWR! exercises  Moves in sitting    PWR! Up  x 5 reps with significant cues for sequencing and transition to UE extension    PWR! Rock  x  5 reps with significant verbal and tactile cues for sequencing and UE positioning with rotation          PT Education - 05/19/18 2051    Education Details  introduced PWR exercises in sitting; discuss importance of establishing an exercise and wellness plan at ILF to supplement therapy and maintain gains    Person(s) Educated  Patient    Methods  Explanation;Demonstration;Tactile cues;Verbal cues    Comprehension  Need further instruction       PT Short Term Goals - 05/04/18 1302      PT SHORT TERM GOAL #1   Title  Pt will participate in initiation of HEP for improvement with strength, balance, and functional mobility     Time  4    Period  Weeks    Status  New    Target Date  05/21/18      PT SHORT TERM GOAL #2   Title  Pt will participate in [place] assessment for baseline  fall risk potential     Time  4    Period  Weeks    Status  Achieved      PT SHORT TERM GOAL #3   Title  Pt will improve gait speed using LRAD to >/= 2.55 ft/sec indicative of improvement in functional mobility and reduction in fall risk potential     Baseline  10/24: 2.25 ft/sec indicative of limited community ambulator with patient using RW     Time  4    Period  Weeks    Status  New    Target Date  05/21/18      PT SHORT TERM GOAL #4   Title  Pt will perform 5x STS from standard height chair with no AD in </= 13 seconds indicating reduction in fall risk potential and improvement in LE power.     Baseline  10/24; 15.35 seconds from standard height chair with no UE assist     Time  4    Period  Weeks    Status  New    Target Date  05/21/18      PT SHORT TERM GOAL #5   Title  Pt will ambulate 500 feet outdoors with supervision using LRAD navigating uneven surfaces, curbs, ramps, and demonstrate the ability to vary gait speed to improve safety with community ambulation.     Baseline  10/24: Pt reports experiencing fall while attempting to negotiate a curb     Time  4    Period  Weeks    Status  New    Target Date  05/21/18        PT Long Term Goals - 05/04/18 1303      PT LONG TERM GOAL #1   Title  Pt will report independence and compliance with strengthening and balance HEP to improve functional mobility.    Time  8    Period  Weeks    Status  New    Target Date  06/20/18      PT LONG TERM GOAL #2   Title  Pt will improve Berg score by 8 points indicating reduction in fall risk potential     Baseline  34/56    Time  8    Period  Weeks    Status  New    Target Date  06/20/18      PT LONG TERM GOAL #3   Title  Pt will improve gait speed with LRAD to >/= 2.85 ft/sec indicative of community  ambulation     Time  8    Period  Weeks    Status  New    Target Date  06/20/18      PT LONG TERM GOAL #4   Title  Pt will perform 1000 feet outdoors with LRAD and Mod I  navigating uneven terrain, curbs, ramps, and demonstrate the ability to vary gait speed to improve safety with community ambulation.     Time  8    Period  Weeks    Status  New    Target Date  06/20/18            Plan - 05/19/18 2053    Clinical Impression Statement  Treatment focused on use of Nustep for endurance and strengthening and to open up discussion with pt regarding what is available to him and his wife at their ILF for ongoing endurance, balance and strength training.  Will need to discuss with wife as pt appears confused about recommendations and relationship between balance issues and need for ongoing exercise and wellness plan.  Introduced seated PWR exercises to focus on weight shifting and rotation; pt again demonstrated significant difficulty following therapist's cues and required total verbal, visual and tactile cues to sequence movements.  Will continue to address and progress towards LTG.    Rehab Potential  Good    Clinical Impairments Affecting Rehab Potential  unspecified diagnosis for LE shaking leading to balance instability and subsequent falls.     PT Treatment/Interventions  ADLs/Self Care Home Management;Gait training;Neuromuscular re-education;Passive range of motion;Functional mobility training;Stair training;Therapeutic activities;Patient/family education;Therapeutic exercise;DME Instruction;Balance training;Aquatic Therapy    PT Next Visit Plan  CHECK STG!  Continue to try/review seated PWR exercises, static and dynamic balance with decrease UE support, stability with turning and pivoting.  Nustep for endurance training.      PT Home Exercise Plan  discuss with wife what options are available at ILF for ongoing wellness    Consulted and Agree with Plan of Care  Patient       Patient will benefit from skilled therapeutic intervention in order to improve the following deficits and impairments:  Abnormal gait, Decreased mobility, Decreased range of motion,  Decreased strength, Decreased balance, Increased edema, Decreased coordination, Difficulty walking  Visit Diagnosis: Muscle weakness (generalized)  Unsteadiness on feet  Other abnormalities of gait and mobility  Repeated falls  Other lack of coordination     Problem List Patient Active Problem List   Diagnosis Date Noted  . Bipolar disorder (HCC) 02/17/2018  . Atrial fibrillation (HCC) 02/17/2018  . Gait instability 02/16/2018  . Tremor 02/16/2018  . Renal insufficiency 02/16/2018  . Polycythemia 02/16/2018  . Hypertension 02/16/2018  . AF (paroxysmal atrial fibrillation) (HCC) 02/16/2018  . Atrial fibrillation with RVR (HCC) 02/16/2018    Dierdre HighmanAudra F Amario Longmore, PT, DPT 05/19/18    9:00 PM    [facility] [address] [place], [place], [address] Phone: [phone]   Fax:  [phone]  Name: Cherre HugerJames R Severin MRN: [id number] Date of Birth: April 20, 1933

## 2018-05-20 ENCOUNTER — Telehealth: Payer: Self-pay | Admitting: Cardiology

## 2018-05-20 NOTE — Telephone Encounter (Signed)
Left message to return call as this medication is not listed on patient's medication list in Dr. York Spanielilley's EMR.

## 2018-05-20 NOTE — Telephone Encounter (Signed)
° ° °  1. Which medications need to be refilled? (please list name of each medication and dose if known) cardizem CD 120mg  capsule once daily  2. Which pharmacy/location (including street and city if local pharmacy) is medication to be sent to?   3. Do they need a 30 day or 90 day supply? 30

## 2018-05-24 ENCOUNTER — Ambulatory Visit: Payer: Medicare Other | Admitting: Physical Therapy

## 2018-05-24 DIAGNOSIS — R2681 Unsteadiness on feet: Secondary | ICD-10-CM | POA: Diagnosis not present

## 2018-05-24 DIAGNOSIS — R2689 Other abnormalities of gait and mobility: Secondary | ICD-10-CM

## 2018-05-24 DIAGNOSIS — R296 Repeated falls: Secondary | ICD-10-CM

## 2018-05-24 DIAGNOSIS — M6281 Muscle weakness (generalized): Secondary | ICD-10-CM | POA: Diagnosis not present

## 2018-05-24 DIAGNOSIS — R278 Other lack of coordination: Secondary | ICD-10-CM

## 2018-05-24 NOTE — Therapy (Signed)
[facility] [address] [place], [place], [address] Phone: [phone]   Fax:  [phone]  Physical Therapy Treatment  Patient Details  Name: Sean Bishop MRN: [id number] Date of Birth: 01-07-1933 Referring Provider (PT): Dr. Leighton Ruff    Encounter Date: 05/24/2018  PT End of Session - 05/24/18 1015    Visit Number  8    Number of Visits  13    Date for PT Re-Evaluation  06/20/18    Authorization Type  [employer] Medicare  - 10th visit PN    PT Start Time  1010    PT Stop Time  1055    PT Time Calculation (min)  45 min    Equipment Utilized During Treatment  Gait belt    Activity Tolerance  Patient tolerated treatment well    Behavior During Therapy  [facility] for tasks assessed/performed       Past Medical History:  Diagnosis Date  . Atrial fibrillation ([place])   . Bipolar 1 disorder ([place])   . DJD (degenerative joint disease)   . Hypertension   . Mitral regurgitation    mild   . Renal insufficiency   . Stroke ([place])   . Tremors of nervous system 01/2018    Past Surgical History:  Procedure Laterality Date  . SHOULDER SURGERY      There were no vitals filed for this visit.  Subjective Assessment - 05/24/18 1013    Subjective  Pt relays he feels steady with his RW but does not feel steady without it, he wants to continue to work on his balance. He admits he does not always get a chance to do his HEP.    Pertinent History  normal pressure hydrocephalus, DDD, h/o thalamic lacunar Stroke, Osteoporosis, Atrial Fibrillation, Bipolar Disorder - long term use of Lithium, Tremor, Renal Insufficiency, Polycythemia, HTN, paroxysmal atrial fibrillation    Limitations  Standing;Walking    Diagnostic tests  MRI    Patient Stated Goals  Regain motor skills such as trusting in his balance with improved overall stability. Improvement in confidence with dec reliance on walker for stability.     Currently in Pain?  No/denies          [facility] PT Assessment - 05/24/18 0001      Assessment   Medical Diagnosis  Unsteady Gait     Referring Provider (PT)  Dr. Leighton Ruff     Onset Date/Surgical Date  04/12/18    Next MD Visit  ?      Ambulation/Gait   Ambulation/Gait  Yes    Ambulation/Gait Assistance  5: Supervision;4: Min guard    Ambulation/Gait Assistance Details  500 ft with RW    Gait Comments  outside on uneven/even surface, up/down ramp and curb and changing velocity      Standardized Balance Assessment   Standardized Balance Assessment  Five Times Sit to Stand;10 meter walk test;Berg Balance Test    Five times sit to stand comments   14.35    10 Meter Walk  3.6 Ft/sec with RW      Berg Balance Test   Sit to Stand  Able to stand without using hands and stabilize independently    Standing Unsupported  Able to stand 2 minutes with supervision    Sitting with Back Unsupported but Feet Supported on Floor or Stool  Able to sit safely and securely 2 minutes    Stand to Sit  Controls descent by using hands  Transfers  Able to transfer safely, definite need of hands    Standing Unsupported with Eyes Closed  Able to stand 10 seconds with supervision    Standing Ubsupported with Feet Together  Needs help to attain position and unable to hold for 15 seconds    From Standing, Reach Forward with Outstretched Arm  Reaches forward but needs supervision    From Standing Position, Pick up Object from Floor  Unable to pick up and needs supervision    From Standing Position, Turn to Look Behind Over each Shoulder  Turn sideways only but maintains balance    Turn 360 Degrees  Needs close supervision or verbal cueing    Standing Unsupported, Alternately Place Feet on Step/Stool  Needs assistance to keep from falling or unable to try    Standing Unsupported, One Foot in [place] help to step but can hold 15 seconds    Standing on One Leg  Unable to try or needs assist to prevent fall    Total Score  26                            PT Education - 05/24/18 1058    Education Details  importance of perfoming HEP regularly especially balance training    Person(s) Educated  Patient    Methods  Explanation;Verbal cues    Comprehension  Verbalized understanding       PT Short Term Goals - 05/24/18 1016      PT SHORT TERM GOAL #1   Title  Pt will participate in initiation of HEP for improvement with strength, balance, and functional mobility     Baseline  not yet fully compliant    Time  4    Period  Weeks    Status  On-going      PT SHORT TERM GOAL #2   Title  Pt will participate in [facility] assessment for baseline fall risk potential     Baseline  26 on 11/26    Time  4    Status  Achieved      PT SHORT TERM GOAL #3   Title  Pt will improve gait speed using LRAD to >/= 2.55 ft/sec indicative of improvement in functional mobility and reduction in fall risk potential     Baseline  3.6 on 11/26 with RW    Status  Achieved      PT SHORT TERM GOAL #4   Title  Pt will perform 5x STS from standard height chair with no AD in </= 13 seconds indicating reduction in fall risk potential and improvement in LE power.     Baseline  11/26 improved to 14.35 seconds from standard height chair with no UE assist     Status  Partially Met      PT SHORT TERM GOAL #5   Title  Pt will ambulate 500 feet outdoors with supervision using LRAD navigating uneven surfaces, curbs, ramps, and demonstrate the ability to vary gait speed to improve safety with community ambulation.     Baseline  500 feet with supervision and cues for curb negotiaiton    Status  Achieved        PT Long Term Goals - 05/04/18 1303      PT LONG TERM GOAL #1   Title  Pt will report independence and compliance with strengthening and balance HEP to improve functional mobility.    Time  8  Period  Weeks    Status  New    Target Date  06/20/18      PT LONG TERM GOAL #2   Title  Pt will improve Berg score by 8 points  indicating reduction in fall risk potential     Baseline  34/56    Time  8    Period  Weeks    Status  New    Target Date  06/20/18      PT LONG TERM GOAL #3   Title  Pt will improve gait speed with LRAD to >/= 2.85 ft/sec indicative of community ambulation     Time  8    Period  Weeks    Status  New    Target Date  06/20/18      PT LONG TERM GOAL #4   Title  Pt will perform 1000 feet outdoors with LRAD and Mod I navigating uneven terrain, curbs, ramps, and demonstrate the ability to vary gait speed to improve safety with community ambulation.     Time  8    Period  Weeks    Status  New    Target Date  06/20/18            Plan - 05/24/18 1059    Clinical Impression Statement  Session today focused on retesting and updating STG and he met 3/5. He has made improvements with 5TSTS and also wiht gait velocity and gait distance with RW and is now able to negotitate curbs and ramps however he still needs some cuing for technique with curb as he stands too far away and then is leaning to much.  He is more steady with RW now however he remains very unsteady without RW or UE assitance and had poor BERG balance test today. He was encouraged to work more on his balance at home with HEP and PT will continue to progress this as able.    Clinical Impairments Affecting Rehab Potential  unspecified diagnosis for LE shaking leading to balance instability and subsequent falls.     PT Frequency  Other (comment)    PT Duration  8 weeks    PT Next Visit Plan  Continue to try/review seated PWR exercises, static and dynamic balance with decrease UE support, stability with turning and pivoting.  Nustep for endurance training.      Consulted and Agree with Plan of Care  Patient       Patient will benefit from skilled therapeutic intervention in order to improve the following deficits and impairments:  Abnormal gait, Decreased mobility, Decreased range of motion, Decreased strength, Decreased balance,  Increased edema, Decreased coordination, Difficulty walking  Visit Diagnosis: Muscle weakness (generalized)  Unsteadiness on feet  Other abnormalities of gait and mobility  Repeated falls  Other lack of coordination     Problem List Patient Active Problem List   Diagnosis Date Noted  . Bipolar disorder ([place]) 02/17/2018  . Atrial fibrillation ([place]) 02/17/2018  . Gait instability 02/16/2018  . Tremor 02/16/2018  . Renal insufficiency 02/16/2018  . Polycythemia 02/16/2018  . Hypertension 02/16/2018  . AF (paroxysmal atrial fibrillation) ([place]) 02/16/2018  . Atrial fibrillation with RVR ([place]) 02/16/2018    Debbe Odea, PT,DPT 05/24/2018, 11:04 AM  [facility] [address] [place], [place], [address] Phone: [phone]   Fax:  [phone]  Name: VINICIO LYNK MRN: [id number] Date of Birth: [birth year]

## 2018-05-25 NOTE — Telephone Encounter (Signed)
Spoke with patient's wife, confirmed that he has been taking diltiazem 120 mg daily since being discharged from hospital in August 2019 which is why it hasn't been updated on Dr. York Spanielilley's record. She reports he doesn't need a refill. Confirmed upcoming appointment.

## 2018-05-30 ENCOUNTER — Other Ambulatory Visit: Payer: Self-pay | Admitting: Psychiatry

## 2018-05-31 NOTE — Telephone Encounter (Signed)
Need to review paper chart  

## 2018-06-01 ENCOUNTER — Ambulatory Visit: Payer: Medicare Other | Attending: Family Medicine | Admitting: Physical Therapy

## 2018-06-01 ENCOUNTER — Encounter: Payer: Self-pay | Admitting: Physical Therapy

## 2018-06-01 DIAGNOSIS — R2681 Unsteadiness on feet: Secondary | ICD-10-CM | POA: Diagnosis not present

## 2018-06-01 DIAGNOSIS — R2689 Other abnormalities of gait and mobility: Secondary | ICD-10-CM | POA: Insufficient documentation

## 2018-06-01 DIAGNOSIS — R278 Other lack of coordination: Secondary | ICD-10-CM | POA: Diagnosis not present

## 2018-06-01 DIAGNOSIS — R296 Repeated falls: Secondary | ICD-10-CM | POA: Diagnosis not present

## 2018-06-01 DIAGNOSIS — M6281 Muscle weakness (generalized): Secondary | ICD-10-CM | POA: Insufficient documentation

## 2018-06-01 NOTE — Therapy (Signed)
[facility] [address] [place], [place], [address] Phone: [phone]   Fax:  [phone]  Physical Therapy Treatment  Patient Details  Name: Sean Bishop MRN: [id number] Date of Birth: [birth year] Referring Provider (PT): Dr. Leighton Ruff    Encounter Date: 06/01/2018  PT End of Session - 06/01/18 1325    Visit Number  9    Number of Visits  13    Date for PT Re-Evaluation  06/20/18    Authorization Type  [employer] Medicare  - 10th visit PN    PT Start Time  1107    PT Stop Time  1149    PT Time Calculation (min)  42 min    Equipment Utilized During Treatment  Gait belt    Activity Tolerance  Patient tolerated treatment well    Behavior During Therapy  WFL for tasks assessed/performed       Past Medical History:  Diagnosis Date  . Atrial fibrillation ([place])   . Bipolar 1 disorder ([place])   . DJD (degenerative joint disease)   . Hypertension   . Mitral regurgitation    mild   . Renal insufficiency   . Stroke ([place])   . Tremors of nervous system 01/2018    Past Surgical History:  Procedure Laterality Date  . SHOULDER SURGERY      There were no vitals filed for this visit.  Subjective Assessment - 06/01/18 1110    Subjective  Pt did work on HEP since last visit.    Pertinent History  normal pressure hydrocephalus, DDD, h/o thalamic lacunar Stroke, Osteoporosis, Atrial Fibrillation, Bipolar Disorder - long term use of Lithium, Tremor, Renal Insufficiency, Polycythemia, HTN, paroxysmal atrial fibrillation    Limitations  Standing;Walking    Diagnostic tests  MRI    Patient Stated Goals  Regain motor skills such as trusting in his balance with improved overall stability. Improvement in confidence with dec reliance on walker for stability.         Pt performed HEP exercises 1-2 sets each with min cues and explanation of functional importance/carryover.   Exercises  Sit to Stand with Arms Crossed - 10 reps  - 2 sets - 1x daily - 7x weekly   06/01/18 Updated to sit<>stands with 1 UE support due to pt reporting confusion/difficulty. [place] Heel rises with counter support - 10 reps - 2 sets - 1x daily - 7x weekly  Toe Raises with Counter Support - 10 reps - 3 sets - 1x daily - 7x weekly  Standing Hip Abduction with Counter Support - 10 reps - 2 sets - 1x daily - 7x weekly  Romberg Stance - 3-5 reps - 1 sets - 30 hold - 1x daily - 7x weekly  Single Leg Stance with Support - 5 reps - 10 second hold - 1x daily - 5x weekly  Standing Hip Extension with Counter Support - 10 reps - 3 sets - 1x daily - 7x weekly                    PT Education - 06/01/18 1131    Education Details  Performed/ Reviewed HEP       PT Short Term Goals - 06/01/18 1326      PT SHORT TERM GOAL #1   Title  Pt will participate in initiation of HEP for improvement with strength, balance, and functional mobility     Baseline  Pt demonstrates understanding of majority of HEP and needed min  cues for SLS and Hip extension.    Time  4    Period  Weeks    Status  Partially Met      PT SHORT TERM GOAL #2   Title  Pt will participate in [place] assessment for baseline fall risk potential     Baseline  26 on 11/26    Time  4    Status  Achieved      PT SHORT TERM GOAL #3   Title  Pt will improve gait speed using LRAD to >/= 2.55 ft/sec indicative of improvement in functional mobility and reduction in fall risk potential     Baseline  3.6 on 11/26 with RW    Status  Achieved      PT SHORT TERM GOAL #4   Title  Pt will perform 5x STS from standard height chair with no AD in </= 13 seconds indicating reduction in fall risk potential and improvement in LE power.     Baseline  11/26 improved to 14.35 seconds from standard height chair with no UE assist     Status  Partially Met      PT SHORT TERM GOAL #5   Title  Pt will ambulate 500 feet outdoors with supervision using LRAD navigating uneven surfaces, curbs, ramps, and  demonstrate the ability to vary gait speed to improve safety with community ambulation.     Baseline  500 feet with supervision and cues for curb negotiaiton    Status  Achieved        PT Long Term Goals - 05/04/18 1303      PT LONG TERM GOAL #1   Title  Pt will report independence and compliance with strengthening and balance HEP to improve functional mobility.    Time  8    Period  Weeks    Status  New    Target Date  06/20/18      PT LONG TERM GOAL #2   Title  Pt will improve Berg score by 8 points indicating reduction in fall risk potential     Baseline  34/56    Time  8    Period  Weeks    Status  New    Target Date  06/20/18      PT LONG TERM GOAL #3   Title  Pt will improve gait speed with LRAD to >/= 2.85 ft/sec indicative of community ambulation     Time  8    Period  Weeks    Status  New    Target Date  06/20/18      PT LONG TERM GOAL #4   Title  Pt will perform 1000 feet outdoors with LRAD and Mod I navigating uneven terrain, curbs, ramps, and demonstrate the ability to vary gait speed to improve safety with community ambulation.     Time  8    Period  Weeks    Status  New    Target Date  06/20/18            Plan - 06/01/18 1327    Clinical Impression Statement  Skilled session focused on addressing STG 1 (HEP) pt partially met but demonstrated understanding of majority of exercises with min cues.  Updated Sit<>stand exercises due to pt reporting confusion and difficulty with crossing arms; gave pt new picture and had him try with 1 UE support which pt was able to do well.  Clinical Impairments Affecting Rehab Potential  unspecified diagnosis for LE shaking leading to balance instability and subsequent falls.     PT Frequency  Other (comment)    PT Duration  8 weeks    PT Next Visit Plan  Continue to try/review seated PWR exercises, static and dynamic balance with decrease UE support, stability with  turning and pivoting.  Nustep for endurance training.      Consulted and Agree with Plan of Care  Patient       Patient will benefit from skilled therapeutic intervention in order to improve the following deficits and impairments:  Abnormal gait, Decreased mobility, Decreased range of motion, Decreased strength, Decreased balance, Increased edema, Decreased coordination, Difficulty walking  Visit Diagnosis: Muscle weakness (generalized)  Unsteadiness on feet  Other abnormalities of gait and mobility  Repeated falls  Other lack of coordination     Problem List Patient Active Problem List   Diagnosis Date Noted  . Bipolar disorder ([place]) 02/17/2018  . Atrial fibrillation ([place]) 02/17/2018  . Gait instability 02/16/2018  . Tremor 02/16/2018  . Renal insufficiency 02/16/2018  . Polycythemia 02/16/2018  . Hypertension 02/16/2018  . AF (paroxysmal atrial fibrillation) ([place]) 02/16/2018  . Atrial fibrillation with RVR ([place]) 02/16/2018   Bjorn Loser, PTA  06/01/18, 1:34 PM [facility] [address] [place], [place], [address] Phone: [phone]   Fax:  [phone]  Name: ANEL PUROHIT MRN: [id number] Date of Birth: 1933-01-05

## 2018-06-01 NOTE — Patient Instructions (Addendum)
Access Code: [email or web address]  URL: [email or web address]  Date: 05/04/2018  Prepared by: Bufford LopeAudra Potter   Exercises  Sit to Stand with Arms Crossed - 10 reps - 2 sets - 1x daily - 7x weekly   06/01/18 Updated to sit<>stands with 1 UE support due to pt reporting confusion/difficulty. [place] Heel rises with counter support - 10 reps - 2 sets - 1x daily - 7x weekly  Toe Raises with Counter Support - 10 reps - 3 sets - 1x daily - 7x weekly  Standing Hip Abduction with Counter Support - 10 reps - 2 sets - 1x daily - 7x weekly  Romberg Stance - 3-5 reps - 1 sets - 30 hold - 1x daily - 7x weekly  Single Leg Stance with Support - 5 reps - 10 second hold - 1x daily - 5x weekly  Standing Hip Extension with Counter Support - 10 reps - 3 sets - 1x daily - 7x weekly

## 2018-06-02 ENCOUNTER — Ambulatory Visit: Payer: Medicare Other | Admitting: Physical Therapy

## 2018-06-09 ENCOUNTER — Ambulatory Visit: Payer: Medicare Other | Admitting: Physical Therapy

## 2018-06-09 ENCOUNTER — Encounter: Payer: Self-pay | Admitting: Physical Therapy

## 2018-06-09 DIAGNOSIS — R278 Other lack of coordination: Secondary | ICD-10-CM | POA: Diagnosis not present

## 2018-06-09 DIAGNOSIS — M6281 Muscle weakness (generalized): Secondary | ICD-10-CM | POA: Diagnosis not present

## 2018-06-09 DIAGNOSIS — R2681 Unsteadiness on feet: Secondary | ICD-10-CM | POA: Diagnosis not present

## 2018-06-09 DIAGNOSIS — R2689 Other abnormalities of gait and mobility: Secondary | ICD-10-CM

## 2018-06-09 DIAGNOSIS — R296 Repeated falls: Secondary | ICD-10-CM

## 2018-06-09 NOTE — Therapy (Addendum)
[facility] [address] [place], [place], [address] Phone: [phone]   Fax:  [phone]  Physical Therapy Treatment and 10th visit PN  Patient Details  Name: Sean Bishop MRN: [id number] Date of Birth: 02-20-1933 Referring Provider (Sean Bishop): Dr. Leighton Ruff    Encounter Date: 06/09/2018   10th Visit Physical Therapy Progress Note  Dates of Reporting Period: 04/21/2018 to 06/09/2018  Sean Bishop, Sean Bishop, Sean Bishop 06/10/18    10:05 AM     Sean Bishop End of Session - 06/09/18 1107    Visit Number  10    Number of Visits  13    Date for Sean Bishop Re-Evaluation  06/20/18    Authorization Type  [employer] Medicare  - 10th visit PN    Sean Bishop Start Time  1102    Sean Bishop Stop Time  1145    Sean Bishop Time Calculation (min)  43 min    Equipment Utilized During Treatment  Gait belt    Activity Tolerance  Patient tolerated treatment well    Behavior During Therapy  WFL for tasks assessed/performed       Past Medical History:  Diagnosis Date  . Atrial fibrillation ([place])   . Bipolar 1 disorder ([place])   . DJD (degenerative joint disease)   . Hypertension   . Mitral regurgitation    mild   . Renal insufficiency   . Stroke ([place])   . Tremors of nervous system 01/2018    Past Surgical History:  Procedure Laterality Date  . SHOULDER SURGERY      There were no vitals filed for this visit.  Subjective Assessment - 06/09/18 1103    Subjective  Had to have 2 teeth pulled yesterday and has to go back in a month for more work. Otherwise no new issues. No new falls to report.     Pertinent History  normal pressure hydrocephalus, DDD, h/o thalamic lacunar Stroke, Osteoporosis, Atrial Fibrillation, Bipolar Disorder - long term use of Lithium, Tremor, Renal Insufficiency, Polycythemia, HTN, paroxysmal atrial fibrillation    Limitations  Standing;Walking    Diagnostic tests  MRI    Patient Stated Goals  Regain motor skills such as trusting in his balance with  improved overall stability. Improvement in confidence with dec reliance on walker for stability.     Currently in Pain?  No/denies         [facility] Sean Bishop Assessment - 06/09/18 1107      Standardized Balance Assessment   Standardized Balance Assessment  Five Times Sit to Stand;10 meter walk test    Five times sit to stand comments   14.07 with no UE support using standard height mat    10 Meter Walk  12.72 sec's= 2.58 ft/sec with RW          [facility] Adult Sean Bishop Treatment/Exercise - 06/09/18 1108      Knee/Hip Exercises: Aerobic   Other Aerobic  Scifit level 3.5 with UE/LE for 8 minutes with goal >/= 50 rpm for strengthening and activity tolerance.         [facility]) - 06/09/18 1126    PWR! Up  8 times    PWR! Twist  8 times    Comments  in standing with legs against mat table and chair in front for support/safety. verbal/visual/demo cues needed on form/technique.     PWR! Up  8 reps    PWR! Rock  8 reps    PWR! Twist  8 reps    PWR! Step  8 reps    Comments  patient needed verbal/visual/demo cues with all ex's. performed 8 reps to each side. used visual target of yard sticks on floor with PWR!Step to encourage larger steps.             Sean Bishop Short Term Goals - 06/01/18 1326      Sean Bishop SHORT TERM GOAL #1   Title  Sean Bishop will participate in initiation of HEP for improvement with strength, balance, and functional mobility     Baseline  Sean Bishop demonstrates understanding of majority of HEP and needed min cues for SLS and Hip extension.    Time  4    Period  Weeks    Status  Partially Met      Sean Bishop SHORT TERM GOAL #2   Title  Sean Bishop will participate in [place] assessment for baseline fall risk potential     Baseline  26 on 11/26    Time  4    Status  Achieved      Sean Bishop SHORT TERM GOAL #3   Title  Sean Bishop will improve gait speed using LRAD to >/= 2.55 ft/sec indicative of improvement in functional mobility and reduction in fall risk potential     Baseline  3.6 on 11/26 with RW    Status  Achieved       Sean Bishop SHORT TERM GOAL #4   Title  Sean Bishop will perform 5x STS from standard height chair with no AD in </= 13 seconds indicating reduction in fall risk potential and improvement in LE power.     Baseline  11/26 improved to 14.35 seconds from standard height chair with no UE assist     Status  Partially Met      Sean Bishop SHORT TERM GOAL #5   Title  Sean Bishop will ambulate 500 feet outdoors with supervision using LRAD navigating uneven surfaces, curbs, ramps, and demonstrate the ability to vary gait speed to improve safety with community ambulation.     Baseline  500 feet with supervision and cues for curb negotiaiton    Status  Achieved        Sean Bishop Long Term Goals - 05/04/18 1303      Sean Bishop LONG TERM GOAL #1   Title  Sean Bishop will report independence and compliance with strengthening and balance HEP to improve functional mobility.    Time  8    Period  Weeks    Status  New    Target Date  06/20/18      Sean Bishop LONG TERM GOAL #2   Title  Sean Bishop will improve Berg score by 8 points indicating reduction in fall risk potential     Baseline  34/56    Time  8    Period  Weeks    Status  New    Target Date  06/20/18      Sean Bishop LONG TERM GOAL #3   Title  Sean Bishop will improve gait speed with LRAD to >/= 2.85 ft/sec indicative of community ambulation     Time  8    Period  Weeks    Status  New    Target Date  06/20/18      Sean Bishop LONG TERM GOAL #4   Title  Sean Bishop will perform 1000 feet outdoors with LRAD and Mod I navigating uneven terrain, curbs, ramps, and demonstrate the ability to vary gait speed to improve safety with community ambulation.     Time  8    Period  Weeks    Status  New    Target Date  06/20/18            Plan - 06/09/18 1107    Clinical Impression Statement  Today's skilled session focused on reassessment of Sean Bishop's gait speed and 5 time sit to stand for 10th visit progress note. 5 time sit to stand improved to 14.07 sec's from 14.35 sec's. Gait speed decreased this session from 3.6 f/sec at last check to 2.58 ft/sec  today, both with RW. Remainder of session continued to address strengthening, activity tolerance and coordintion with Scifit and PWR! exercises. The Sean Bishop is slowly progressing toward goals and should benefit from continued Sean Bishop to progress toward unmet goals.     Clinical Impairments Affecting Rehab Potential  unspecified diagnosis for LE shaking leading to balance instability and subsequent falls.     Sean Bishop Frequency  Other (comment)    Sean Bishop Duration  8 weeks    Sean Bishop Next Visit Plan  Continue to review seated PWR exercises, static and dynamic balance with decrease UE support, stability with turning and pivoting.  Nustep for endurance training.      Consulted and Agree with Plan of Care  Patient       Patient will benefit from skilled therapeutic intervention in order to improve the following deficits and impairments:  Abnormal gait, Decreased mobility, Decreased range of motion, Decreased strength, Decreased balance, Increased edema, Decreased coordination, Difficulty walking  Visit Diagnosis: Muscle weakness (generalized)  Unsteadiness on feet  Other abnormalities of gait and mobility  Repeated falls     Problem List Patient Active Problem List   Diagnosis Date Noted  . Bipolar disorder ([place]) 02/17/2018  . Atrial fibrillation ([place]) 02/17/2018  . Gait instability 02/16/2018  . Tremor 02/16/2018  . Renal insufficiency 02/16/2018  . Polycythemia 02/16/2018  . Hypertension 02/16/2018  . AF (paroxysmal atrial fibrillation) ([place]) 02/16/2018  . Atrial fibrillation with RVR ([place]) 02/16/2018    Sean Bishop, Sean Bishop, [facility] [address], [place], [place] [address] [phone] 06/09/18, 1:33 PM   Name: JIRAIYA MCEWAN MRN: [id number] Date of Birth: Jan 19, 1933

## 2018-06-14 DIAGNOSIS — H401132 Primary open-angle glaucoma, bilateral, moderate stage: Secondary | ICD-10-CM | POA: Diagnosis not present

## 2018-06-16 ENCOUNTER — Ambulatory Visit: Payer: Medicare Other | Admitting: Physical Therapy

## 2018-06-16 ENCOUNTER — Encounter: Payer: Self-pay | Admitting: Physical Therapy

## 2018-06-16 DIAGNOSIS — R2689 Other abnormalities of gait and mobility: Secondary | ICD-10-CM

## 2018-06-16 DIAGNOSIS — M6281 Muscle weakness (generalized): Secondary | ICD-10-CM

## 2018-06-16 DIAGNOSIS — R2681 Unsteadiness on feet: Secondary | ICD-10-CM | POA: Diagnosis not present

## 2018-06-16 DIAGNOSIS — R278 Other lack of coordination: Secondary | ICD-10-CM | POA: Diagnosis not present

## 2018-06-16 DIAGNOSIS — R296 Repeated falls: Secondary | ICD-10-CM | POA: Diagnosis not present

## 2018-06-16 NOTE — Therapy (Signed)
[facility] [address] [place], [place], [address] Phone: [phone]   Fax:  [phone]  Physical Therapy Treatment  Patient Details  Name: Sean Bishop MRN: [id number] Date of Birth: 09/09/1932 Referring Provider (PT): Dr. Leighton Ruff    Encounter Date: 06/16/2018  PT End of Session - 06/16/18 1204    Visit Number  11    Number of Visits  13    Date for PT Re-Evaluation  06/20/18    Authorization Type  [employer] Medicare  - 10th visit PN    PT Start Time  1109    PT Stop Time  1154    PT Time Calculation (min)  45 min    Activity Tolerance  Patient tolerated treatment well    Behavior During Therapy  [facility] for tasks assessed/performed       Past Medical History:  Diagnosis Date  . Atrial fibrillation ([place])   . Bipolar 1 disorder ([place])   . DJD (degenerative joint disease)   . Hypertension   . Mitral regurgitation    mild   . Renal insufficiency   . Stroke ([place])   . Tremors of nervous system 01/2018    Past Surgical History:  Procedure Laterality Date  . SHOULDER SURGERY      There were no vitals filed for this visit.  Subjective Assessment - 06/16/18 1114    Subjective  Pt reports standing from chair without use of UE yesterday and today!    Pertinent History  normal pressure hydrocephalus, DDD, h/o thalamic lacunar Stroke, Osteoporosis, Atrial Fibrillation, Bipolar Disorder - long term use of Lithium, Tremor, Renal Insufficiency, Polycythemia, HTN, paroxysmal atrial fibrillation    Limitations  Standing;Walking    Diagnostic tests  MRI    Patient Stated Goals  Regain motor skills such as trusting in his balance with improved overall stability. Improvement in confidence with dec reliance on walker for stability.     Currently in Pain?  No/denies         [facility] PT Assessment - 06/16/18 1115      Ambulation/Gait   Ambulation/Gait  Yes    Ambulation/Gait Assistance  5: Supervision;3: Mod assist     Ambulation/Gait Assistance Details  with RW - did not ambulate outside due to weather but simulated outdoor obstacles and over compliant ground (blue mat), over obstacles and weaving around obstacles with RW and then without RW.  With RW pt only required supervision for higher level challenges but did require verbal cues for safety.  Pt stating he would like to be able to ambulate without RW - performed again without AD but pt required Mod A  for balance and safe sequencing.  Pt's step length, stance time and foot clearance decrease significantly without UE support and BOS widens.    Ambulation Distance (Feet)  115 Feet    Assistive device  Rolling walker;None    Ambulation Surface  Level;Indoor;Unlevel;Other (comment)   blue soft mat   Ramp  5: Supervision    Ramp Details (indicate cue type and reason)  with RW x 2    Curb  5: Supervision    Curb Details (indicate cue type and reason)  with RW x 2 with verbal cues for safe sequencing and safety with RW      Standardized Balance Assessment   Standardized Balance Assessment  Berg Balance Test;10 meter walk test    Five times sit to stand comments   11.35 seconds from mat without UE support  10 Meter Walk  11.63 seconds with RW or 2.8 ft/sec       Berg Balance Test   Sit to Stand  Able to stand without using hands and stabilize independently    Standing Unsupported  Able to stand 2 minutes with supervision    Sitting with Back Unsupported but Feet Supported on Floor or Stool  Able to sit safely and securely 2 minutes    Stand to Sit  Controls descent by using hands    Transfers  Able to transfer safely, definite need of hands    Standing Unsupported with Eyes Closed  Able to stand 10 seconds with supervision    Standing Ubsupported with Feet Together  Needs help to attain position but able to stand for 30 seconds with feet together    From Standing, Reach Forward with Outstretched Arm  Can reach forward >12 cm safely (5")    From Standing  Position, Pick up Object from Floor  Unable to pick up shoe, but reaches 2-5 cm (1-2") from shoe and balances independently    From Standing Position, Turn to Look Behind Over each Shoulder  Turn sideways only but maintains balance    Turn 360 Degrees  Able to turn 360 degrees safely but slowly    Standing Unsupported, Alternately Place Feet on Step/Stool  Able to complete >2 steps/needs minimal assist    Standing Unsupported, One Foot in Front  Needs help to step but can hold 15 seconds    Standing on One Leg  Unable to try or needs assist to prevent fall    Total Score  32    Berg comment:  32/56                           PT Education - 06/16/18 1203    Education Details  progress towards goals, plan for final visit - HEP.  Discussed pt's balance, falls risk, safety and awareness of impairments when discussing ambulation without RW    Person(s) Educated  Patient    Methods  Explanation    Comprehension  Need further instruction       PT Short Term Goals - 06/01/18 1326      PT SHORT TERM GOAL #1   Title  Pt will participate in initiation of HEP for improvement with strength, balance, and functional mobility     Baseline  Pt demonstrates understanding of majority of HEP and needed min cues for SLS and Hip extension.    Time  4    Period  Weeks    Status  Partially Met      PT SHORT TERM GOAL #2   Title  Pt will participate in [place] assessment for baseline fall risk potential     Baseline  26 on 11/26    Time  4    Status  Achieved      PT SHORT TERM GOAL #3   Title  Pt will improve gait speed using LRAD to >/= 2.55 ft/sec indicative of improvement in functional mobility and reduction in fall risk potential     Baseline  3.6 on 11/26 with RW    Status  Achieved      PT SHORT TERM GOAL #4   Title  Pt will perform 5x STS from standard height chair with no AD in </= 13 seconds indicating reduction in fall risk potential and improvement in LE power.      Baseline  11/26  improved to 14.35 seconds from standard height chair with no UE assist     Status  Partially Met      PT SHORT TERM GOAL #5   Title  Pt will ambulate 500 feet outdoors with supervision using LRAD navigating uneven surfaces, curbs, ramps, and demonstrate the ability to vary gait speed to improve safety with community ambulation.     Baseline  500 feet with supervision and cues for curb negotiaiton    Status  Achieved        PT Long Term Goals - 06/16/18 1114      PT LONG TERM GOAL #1   Title  Pt will report independence and compliance with strengthening and balance HEP to improve functional mobility.    Time  8    Period  Weeks    Status  On-going      PT LONG TERM GOAL #2   Title  Pt will improve Berg score by 8 points indicating reduction in fall risk potential     Baseline  26 > 32/56  (6 point increase) improved but not to goal    Time  8    Period  Weeks    Status  Partially Met      PT LONG TERM GOAL #3   Title  Pt will improve gait speed with LRAD to >/= 2.85 ft/sec indicative of community ambulation     Baseline  2.8    Time  8    Period  Weeks    Status  Achieved      PT LONG TERM GOAL #4   Title  Pt will perform 1000 feet outdoors with LRAD and Mod I navigating uneven terrain, curbs, ramps, and demonstrate the ability to vary gait speed to improve safety with community ambulation.     Baseline  supervision with RW    Time  8    Period  Weeks    Status  Partially Met            Plan - 06/16/18 1204    Clinical Impression Statement  Treatment session today focused on assessment of progress towards LTG.  Pt has made steady progress and is demonstrating improvement in LE strength, balance and safety with gait with RW as indicated by increase in gait velocity, increase in BERG score and decrease in five time sit to stand.  Pt also demonstrating improved confidence and balance without UE support but continues to demonstrate significant falls risk  when ambulating without AD.  Final session to address HEP and plan to D/C pt after next visit.    Rehab Potential  Good    Clinical Impairments Affecting Rehab Potential  unspecified diagnosis for LE shaking leading to balance instability and subsequent falls.     PT Frequency  Other (comment)    PT Duration  8 weeks    PT Treatment/Interventions  ADLs/Self Care Home Management;Gait training;Neuromuscular re-education;Passive range of motion;Functional mobility training;Stair training;Therapeutic activities;Patient/family education;Therapeutic exercise;DME Instruction;Balance training;Aquatic Therapy    PT Next Visit Plan  final HEP: PWR, standing without UE support/weight shifting, hip strategy/reaching at counter    Consulted and Agree with Plan of Care  Patient       Patient will benefit from skilled therapeutic intervention in order to improve the following deficits and impairments:  Abnormal gait, Decreased mobility, Decreased range of motion, Decreased strength, Decreased balance, Increased edema, Decreased coordination, Difficulty walking  Visit Diagnosis: Muscle weakness (generalized)  Unsteadiness on feet  Other abnormalities of  gait and mobility  Repeated falls  Other lack of coordination     Problem List Patient Active Problem List   Diagnosis Date Noted  . Bipolar disorder ([place]) 02/17/2018  . Atrial fibrillation ([place]) 02/17/2018  . Gait instability 02/16/2018  . Tremor 02/16/2018  . Renal insufficiency 02/16/2018  . Polycythemia 02/16/2018  . Hypertension 02/16/2018  . AF (paroxysmal atrial fibrillation) ([place]) 02/16/2018  . Atrial fibrillation with RVR ([place]) 02/16/2018    Rico Junker, PT, DPT 06/16/18    12:14 PM    [facility] [address] [place], [place], [address] Phone: [phone]   Fax:  [phone]  Name: Sean Bishop MRN: [id number] Date of Birth: [birth year]

## 2018-06-20 ENCOUNTER — Other Ambulatory Visit: Payer: Self-pay | Admitting: Cardiology

## 2018-06-20 MED ORDER — APIXABAN 5 MG PO TABS: 5.0000 mg | ORAL_TABLET | Freq: Two times a day (BID) | ORAL | 1 refills | Status: DC

## 2018-06-20 MED ORDER — DILTIAZEM HCL ER COATED BEADS 120 MG PO CP24: 120.0000 mg | ORAL_CAPSULE | Freq: Every day | ORAL | 1 refills | Status: DC

## 2018-06-20 NOTE — Telephone Encounter (Signed)
Patient is scheduled to see Dr. Bing MatterKrasowski on 08/17/2018 for follow up. Refills for cardizem CD 120 mg and eliquis 5 mg sent to [facility] in [place] to get patient to appointment.

## 2018-06-20 NOTE — Telephone Encounter (Signed)
PCP had been refilling cardizem but said they need to go thru cardiologist.  1. Which medications need to be refilled? (please list name of each medication and dose if known) Cardizem CD 120mg  capsule; Eloquis 5mg  1BID  2. Which pharmacy/location (including street and city if local pharmacy) is medication to be sent to?[facility]  3. Do they need a 30 day or 90 day supply? 60

## 2018-06-21 ENCOUNTER — Ambulatory Visit: Payer: Medicare Other | Admitting: Physical Therapy

## 2018-06-21 ENCOUNTER — Encounter: Payer: Self-pay | Admitting: Physical Therapy

## 2018-06-21 DIAGNOSIS — R2681 Unsteadiness on feet: Secondary | ICD-10-CM

## 2018-06-21 DIAGNOSIS — R296 Repeated falls: Secondary | ICD-10-CM

## 2018-06-21 DIAGNOSIS — M6281 Muscle weakness (generalized): Secondary | ICD-10-CM | POA: Diagnosis not present

## 2018-06-21 DIAGNOSIS — R278 Other lack of coordination: Secondary | ICD-10-CM

## 2018-06-21 DIAGNOSIS — R2689 Other abnormalities of gait and mobility: Secondary | ICD-10-CM

## 2018-06-21 NOTE — Patient Instructions (Addendum)
    Access Code: [email or web address]  URL: [email or web address]  Date: 06/21/2018  Prepared by: Bufford LopeAudra Braelen Sproule   Exercises  Sit to Stand with Armchair - 12 reps - 2 sets - 1x daily - 7x weekly  Heel rises with counter support - 12 reps - 2 sets - 1x daily - 7x weekly  Toe Raises with Counter Support - 12 reps - 3 sets - 1x daily - 7x weekly  Standing Hip Abduction with Counter Support - 12 reps - 2 sets - 1x daily - 7x weekly  Standing Hip Extension with Counter Support - 12 reps - 3 sets - 1x daily - 7x weekly  Forward Reach Using Hip Strategy Forward Backward - 10 reps - 1 sets - 1x daily - 7x weekly  Forward Reach Using Hip Strategy Side to Side - 10 reps - 1 sets - 1x daily - 7x weekly  Romberg Stance with Head Rotation - 10 reps - 1 sets - 1x daily - 7x weekly

## 2018-06-21 NOTE — Therapy (Signed)
[facility] [address] [place], [place], [address] Phone: [phone]   Fax:  [phone]  Physical Therapy Treatment  Patient Details  Name: Sean Bishop MRN: [id number] Date of Birth: 20-Jul-1932 Referring Provider (PT): Dr. Leighton Ruff    Encounter Date: 06/21/2018  PT End of Session - 06/21/18 1324    Visit Number  12    Number of Visits  13    Date for PT Re-Evaluation  06/20/18    Authorization Type  [employer] Medicare  - 10th visit PN    PT Start Time  1100    PT Stop Time  1150    PT Time Calculation (min)  50 min    Activity Tolerance  Patient tolerated treatment well    Behavior During Therapy  [facility] for tasks assessed/performed       Past Medical History:  Diagnosis Date  . Atrial fibrillation ([place])   . Bipolar 1 disorder ([place])   . DJD (degenerative joint disease)   . Hypertension   . Mitral regurgitation    mild   . Renal insufficiency   . Stroke ([place])   . Tremors of nervous system 01/2018    Past Surgical History:  Procedure Laterality Date  . SHOULDER SURGERY      There were no vitals filed for this visit.  Subjective Assessment - 06/21/18 1102    Subjective  Having a quiet day; no falls.      Pertinent History  normal pressure hydrocephalus, DDD, h/o thalamic lacunar Stroke, Osteoporosis, Atrial Fibrillation, Bipolar Disorder - long term use of Lithium, Tremor, Renal Insufficiency, Polycythemia, HTN, paroxysmal atrial fibrillation    Limitations  Standing;Walking    Diagnostic tests  MRI    Patient Stated Goals  Regain motor skills such as trusting in his balance with improved overall stability. Improvement in confidence with dec reliance on walker for stability.     Currently in Pain?  No/denies         [facility] PT Assessment - 06/21/18 1324      Assessment   Medical Diagnosis  Unsteady Gait     Referring Provider (PT)  Dr. Leighton Ruff     Onset Date/Surgical Date  04/12/18     Hand Dominance  Right    Prior Therapy  HHPT and SLP      Prior Function   Level of Independence  Requires assistive device for independence                      [facility]) - 06/21/18 1333    PWR Step  5 reps to each side standing and facing counter; rotating away from counter       Access Code: [email or web address]: [email or web address]  Date: 06/21/2018  Prepared by: Misty Stanley   Exercises  Sit to Stand with Armchair - 12 reps - 2 sets - 1x daily - 7x weekly  Heel rises with counter support - 12 reps - 2 sets - 1x daily - 7x weekly  Toe Raises with Counter Support - 12 reps - 3 sets - 1x daily - 7x weekly  Standing Hip Abduction with Counter Support - 12 reps - 2 sets - 1x daily - 7x weekly  Standing Hip Extension with Counter Support - 12 reps - 3 sets - 1x daily - 7x weekly  Forward Reach Using Hip Strategy Forward Backward - 10 reps - 1 sets - 1x daily - 7x weekly  Forward  Reach Using Hip Strategy Side to Side - 10 reps - 1 sets - 1x daily - 7x weekly  Romberg Stance with Head Rotation - 10 reps - 1 sets - 1x daily - 7x weekly       PT Education - 06/21/18 1145    Education Details  final HEP; D/C today    Person(s) Educated  Patient    Methods  Explanation;Demonstration;Handout    Comprehension  Verbalized understanding;Returned demonstration       PT Short Term Goals - 06/01/18 1326      PT SHORT TERM GOAL #1   Title  Pt will participate in initiation of HEP for improvement with strength, balance, and functional mobility     Baseline  Pt demonstrates understanding of majority of HEP and needed min cues for SLS and Hip extension.    Time  4    Period  Weeks    Status  Partially Met      PT SHORT TERM GOAL #2   Title  Pt will participate in [place] assessment for baseline fall risk potential     Baseline  26 on 11/26    Time  4    Status  Achieved      PT SHORT TERM GOAL #3   Title  Pt will improve gait speed using LRAD to >/= 2.55  ft/sec indicative of improvement in functional mobility and reduction in fall risk potential     Baseline  3.6 on 11/26 with RW    Status  Achieved      PT SHORT TERM GOAL #4   Title  Pt will perform 5x STS from standard height chair with no AD in </= 13 seconds indicating reduction in fall risk potential and improvement in LE power.     Baseline  11/26 improved to 14.35 seconds from standard height chair with no UE assist     Status  Partially Met      PT SHORT TERM GOAL #5   Title  Pt will ambulate 500 feet outdoors with supervision using LRAD navigating uneven surfaces, curbs, ramps, and demonstrate the ability to vary gait speed to improve safety with community ambulation.     Baseline  500 feet with supervision and cues for curb negotiaiton    Status  Achieved        PT Long Term Goals - 06/21/18 1325      PT LONG TERM GOAL #1   Title  Pt will report independence and compliance with strengthening and balance HEP to improve functional mobility.    Time  8    Period  Weeks    Status  Achieved      PT LONG TERM GOAL #2   Title  Pt will improve Berg score by 8 points indicating reduction in fall risk potential     Baseline  26 > 32/56  (6 point increase) improved but not to goal    Time  8    Period  Weeks    Status  Partially Met      PT LONG TERM GOAL #3   Title  Pt will improve gait speed with LRAD to >/= 2.85 ft/sec indicative of community ambulation     Baseline  2.8    Time  8    Period  Weeks    Status  Achieved      PT LONG TERM GOAL #4   Title  Pt will perform 1000 feet outdoors with LRAD and Mod I  navigating uneven terrain, curbs, ramps, and demonstrate the ability to vary gait speed to improve safety with community ambulation.     Baseline  supervision with RW    Time  8    Period  Weeks    Status  Partially Met            Plan - 06/21/18 1325    Clinical Impression Statement  Treatment session focused on review of final HEP and recommendations for  ongoing wellness.  Pt final HEP focused on proximal hip strengthening, SLS, hip strategy training and transverse plane/rotational movements.  Pt required multiple repetitions to return demonstrate new exercises.  Also recommending pt become more involved in the group wellness classes at [facility].  Pt has made steady progress and has met 2/4 LTG, partially meeting other two goals.  Pt has not reported any further falls and is demonstrating improved balance with decreased UE support and safer transfers and gait.  Pt agreeable to D/C today.    Rehab Potential  Good    Clinical Impairments Affecting Rehab Potential  unspecified diagnosis for LE shaking leading to balance instability and subsequent falls.     PT Frequency  Other (comment)    PT Duration  8 weeks    PT Treatment/Interventions  ADLs/Self Care Home Management;Gait training;Neuromuscular re-education;Passive range of motion;Functional mobility training;Stair training;Therapeutic activities;Patient/family education;Therapeutic exercise;DME Instruction;Balance training;Aquatic Therapy    PT Next Visit Plan  D/C    Consulted and Agree with Plan of Care  Patient       Patient will benefit from skilled therapeutic intervention in order to improve the following deficits and impairments:  Abnormal gait, Decreased mobility, Decreased range of motion, Decreased strength, Decreased balance, Increased edema, Decreased coordination, Difficulty walking  Visit Diagnosis: Unsteadiness on feet  Muscle weakness (generalized)  Other abnormalities of gait and mobility  Repeated falls  Other lack of coordination     Problem List Patient Active Problem List   Diagnosis Date Noted  . Bipolar disorder ([place]) 02/17/2018  . Atrial fibrillation ([place]) 02/17/2018  . Gait instability 02/16/2018  . Tremor 02/16/2018  . Renal insufficiency 02/16/2018  . Polycythemia 02/16/2018  . Hypertension 02/16/2018  . AF (paroxysmal atrial fibrillation) ([place])  02/16/2018  . Atrial fibrillation with RVR ([place]) 02/16/2018    PHYSICAL THERAPY DISCHARGE SUMMARY  Visits from Start of Care: 12  Current functional level related to goals / functional outcomes: See impression statement and LTG achievement   Remaining deficits: Impaired balance, LE strength and gait, impaired cognition   Education / Equipment: HEP  Plan: Patient agrees to discharge.  Patient goals were partially met. Patient is being discharged due to being pleased with the current functional level.  ?????     Rico Junker, PT, DPT 06/21/18    1:34 PM    [facility] [address] [place], [place], [address] Phone: [phone]   Fax:  [phone]  Name: Sean Bishop MRN: [id number] Date of Birth: 1933/01/31

## 2018-06-28 ENCOUNTER — Ambulatory Visit: Payer: Medicare Other | Admitting: Physical Therapy

## 2018-06-28 DIAGNOSIS — E291 Testicular hypofunction: Secondary | ICD-10-CM | POA: Diagnosis not present

## 2018-07-07 DIAGNOSIS — R2681 Unsteadiness on feet: Secondary | ICD-10-CM | POA: Diagnosis not present

## 2018-07-07 DIAGNOSIS — R251 Tremor, unspecified: Secondary | ICD-10-CM | POA: Diagnosis not present

## 2018-07-07 DIAGNOSIS — Z79899 Other long term (current) drug therapy: Secondary | ICD-10-CM | POA: Diagnosis not present

## 2018-07-27 DIAGNOSIS — Z6836 Body mass index (BMI) 36.0-36.9, adult: Secondary | ICD-10-CM | POA: Diagnosis not present

## 2018-07-27 DIAGNOSIS — I4891 Unspecified atrial fibrillation: Secondary | ICD-10-CM | POA: Diagnosis not present

## 2018-07-27 DIAGNOSIS — Z8739 Personal history of other diseases of the musculoskeletal system and connective tissue: Secondary | ICD-10-CM | POA: Diagnosis not present

## 2018-07-29 DIAGNOSIS — E291 Testicular hypofunction: Secondary | ICD-10-CM | POA: Diagnosis not present

## 2018-07-29 DIAGNOSIS — M810 Age-related osteoporosis without current pathological fracture: Secondary | ICD-10-CM | POA: Diagnosis not present

## 2018-07-29 DIAGNOSIS — N402 Nodular prostate without lower urinary tract symptoms: Secondary | ICD-10-CM | POA: Diagnosis not present

## 2018-07-29 DIAGNOSIS — N400 Enlarged prostate without lower urinary tract symptoms: Secondary | ICD-10-CM | POA: Diagnosis not present

## 2018-08-05 ENCOUNTER — Other Ambulatory Visit: Payer: Self-pay | Admitting: Urology

## 2018-08-05 DIAGNOSIS — M810 Age-related osteoporosis without current pathological fracture: Secondary | ICD-10-CM

## 2018-08-17 ENCOUNTER — Ambulatory Visit: Payer: Medicare Other | Admitting: Cardiology

## 2018-08-17 ENCOUNTER — Encounter: Payer: Self-pay | Admitting: Cardiology

## 2018-08-17 VITALS — BP 110/68 | HR 76 | Ht 66.0 in | Wt 213.1 lb

## 2018-08-17 DIAGNOSIS — D751 Secondary polycythemia: Secondary | ICD-10-CM | POA: Diagnosis not present

## 2018-08-17 DIAGNOSIS — I4821 Permanent atrial fibrillation: Secondary | ICD-10-CM

## 2018-08-17 DIAGNOSIS — I10 Essential (primary) hypertension: Secondary | ICD-10-CM

## 2018-08-17 DIAGNOSIS — F319 Bipolar disorder, unspecified: Secondary | ICD-10-CM | POA: Diagnosis not present

## 2018-08-17 DIAGNOSIS — I480 Paroxysmal atrial fibrillation: Secondary | ICD-10-CM | POA: Diagnosis not present

## 2018-08-17 NOTE — Progress Notes (Signed)
Cardiology Office Note:    Date:  08/17/2018   ID:  Sean Bishop, DOB [birth year], MRN [id number]  PCP:  Juluis Rainier, MD  Cardiologist:  Gypsy Balsam, MD    Referring MD: Juluis Rainier, MD   Chief Complaint  Patient presents with  . Follow-up  Doing well  History of Present Illness:    Sean Bishop is a 85 y.o. male who is patient of Dr. York Spaniel.  He does have permanent atrial fibrillation, hypertension hyperlipidemia he is long-term anticoagulated with Eliquis.  He also got remote history of TIA.  Comes today to my office for follow-up will doing well apparently in summer of last year he tripped on the sidewalk and ended up falling injuring himself after that he signed up with rehab he was doing rehab on the regular basis and he enjoyed about a month ago he graduated from it he used to exercise on a regular basis he was to go to gym for 30 years and I encouraged him to go back there he is afraid because he is worried he is going to fall down I told him he can do some exercise that will not put him at risk of falling like stationary bike or weightlifting while sitting he said he will consider he does a list of exercises from rehab place that he try to do every day at home and he is doing well from that point review.  Cardiac wise doing well denies have any chest pain tightness squeezing pressure been chest no palpitations.  No dizziness no passing out.  Past Medical History:  Diagnosis Date  . Atrial fibrillation ([facility])   . Bipolar 1 disorder ([facility])   . DJD (degenerative joint disease)   . Hypertension   . Mitral regurgitation    mild   . Renal insufficiency   . Stroke ([facility])   . Tremors of nervous system 01/2018    Past Surgical History:  Procedure Laterality Date  . SHOULDER SURGERY      Current Medications: Current Meds  Medication Sig  . acetaminophen (TYLENOL) 500 MG tablet Take 500 mg by mouth 2 (two) times daily.   Marland Kitchen apixaban (ELIQUIS) 5 MG TABS tablet  Take 1 tablet (5 mg total) by mouth 2 (two) times daily.  . Ascorbic Acid (VITAMIN C PO) Take 1 tablet by mouth daily.  . Cholecalciferol (VITAMIN D PO) Take 1 tablet by mouth daily.  Marland Kitchen diltiazem (CARDIZEM CD) 120 MG 24 hr capsule Take 1 capsule (120 mg total) by mouth daily.  . dorzolamide-timolol (COSOPT) 22.3-6.8 MG/ML ophthalmic solution Place 1 drop into both eyes every morning.  . latanoprost (XALATAN) 0.005 % ophthalmic solution Place 1 drop into both eyes at bedtime.  Marland Kitchen lithium carbonate (LITHOBID) 300 MG CR tablet Take 300 mg by mouth 2 (two) times daily.  . Multiple Vitamin (MULTIVITAMIN WITH MINERALS) TABS tablet Take 1 tablet by mouth daily.     Allergies:   Lipitor [atorvastatin]   Social History   Socioeconomic History  . Marital status: Married    Spouse name: Not on file  . Number of children: Not on file  . Years of education: Not on file  . Highest education level: Not on file  Occupational History  . Not on file  Social Needs  . Financial resource strain: Not on file  . Food insecurity:    Worry: Not on file    Inability: Not on file  . Transportation needs:    Medical: Not  on file    Non-medical: Not on file  Tobacco Use  . Smoking status: Former Games developer  . Smokeless tobacco: Never Used  Substance and Sexual Activity  . Alcohol use: Yes    Alcohol/week: 1.0 standard drinks    Types: 1 Glasses of wine per week    Comment: 1 per month  . Drug use: No  . Sexual activity: Not on file  Lifestyle  . Physical activity:    Days per week: Not on file    Minutes per session: Not on file  . Stress: Not on file  Relationships  . Social connections:    Talks on phone: Not on file    Gets together: Not on file    Attends religious service: Not on file    Active member of club or organization: Not on file    Attends meetings of clubs or organizations: Not on file    Relationship status: Not on file  Other Topics Concern  . Not on file  Social History  Narrative  . Not on file     Family History: The patient's family history includes Alcohol abuse in his mother; Cancer in his father. ROS:   Please see the history of present illness.    All 14 point review of systems negative except as described per history of present illness  EKGs/Labs/Other Studies Reviewed:    EKG done today showed: Atrial fibrillation with controlled ventricular rate.  Normal QS complex duration morphology nonspecific ST-T segment changes Echocardiogram done in August 2019: Study Conclusions  - Left ventricle: The cavity size was normal. There was focal basal   hypertrophy. Systolic function was mildly reduced. The estimated   ejection fraction was = 50%. - Aortic valve: Moderately calcified annulus. Moderately thickened   leaflets. Valve area (VTI): 1.71 cm^2. Valve area (Vmax): 1.84   cm^2. Valve area (Vmean): 1.71 cm^2. - Mitral valve: Mildly calcified annulus. Mildly thickened leaflets   . There was mild regurgitation. - Atrial septum: No defect or patent foramen ovale was identified. - Technically difficult study. Echocontrast was used to enhance   visualization.      Recent Labs: 02/16/2018: TSH 2.105 02/17/2018: ALT 21; BUN 12; Creatinine, Ser 1.07; Hemoglobin 16.5; Platelets 194; Potassium 4.5; Sodium 140  Recent Lipid Panel    Component Value Date/Time   CHOL  08/24/2009 0645    171        ATP III CLASSIFICATION:  <200     mg/dL   Desirable  443-154  mg/dL   Borderline High  >=008    mg/dL   High          TRIG 676 08/24/2009 0645   HDL 36 (L) 08/24/2009 0645   CHOLHDL 4.8 08/24/2009 0645   VLDL 24 08/24/2009 0645   LDLCALC (H) 08/24/2009 0645    111        Total Cholesterol/HDL:CHD Risk Coronary Heart Disease Risk Table                     Men   Women  1/2 Average Risk   3.4   3.3  Average Risk       5.0   4.4  2 X Average Risk   9.6   7.1  3 X Average Risk  23.4   11.0        Use the calculated Patient Ratio above and the CHD  Risk Table to determine the patient's CHD Risk.  ATP III CLASSIFICATION (LDL):  <100     mg/dL   Optimal  161-096100-129  mg/dL   Near or Above                    Optimal  130-159  mg/dL   Borderline  045-409160-189  mg/dL   High  >811>190     mg/dL   Very High    Physical Exam:    VS:  BP 110/68   Pulse 76   Ht 5\' 6"  (1.676 m)   Wt 213 lb 1.9 oz (96.7 kg)   SpO2 98%   BMI 34.40 kg/m     Wt Readings from Last 3 Encounters:  08/17/18 213 lb 1.9 oz (96.7 kg)  03/09/18 223 lb (101.2 kg)  02/19/18 221 lb 11.2 oz (100.6 kg)     GEN:  Well nourished, well developed in no acute distress HEENT: Normal NECK: No JVD; No carotid bruits LYMPHATICS: No lymphadenopathy CARDIAC: RRR, no murmurs, no rubs, no gallops RESPIRATORY:  Clear to auscultation without rales, wheezing or rhonchi  ABDOMEN: Soft, non-tender, non-distended MUSCULOSKELETAL:  No edema; No deformity  SKIN: Warm and dry LOWER EXTREMITIES: no swelling NEUROLOGIC:  Alert and oriented x 3 PSYCHIATRIC:  Normal affect   ASSESSMENT:    1. AF (paroxysmal atrial fibrillation) (HCC)   2. Permanent atrial fibrillation   3. Polycythemia   4. Bipolar affective disorder, remission status unspecified (HCC)   5. Essential hypertension    PLAN:    In order of problems listed above:  1. Permanent atrial fibrillation rate control anticoagulated with Eliquis which I will continue. 2. Polycythemia follow-up by internal medicine team. 3. Bipolar disorder without any recent relapses.  Apparently doing well 4. Essential hypertension stable on appropriate medications which I will continue.   Medication Adjustments/Labs and Tests Ordered: Current medicines are reviewed at length with the patient today.  Concerns regarding medicines are outlined above.  Orders Placed This Encounter  Procedures  . EKG 12-Lead   Medication changes: No orders of the defined types were placed in this encounter.   Signed, Georgeanna Leaobert J. Nikia Levels, MD,  [facility] PaFACC 08/17/2018 12:10 PM    [facility]

## 2018-08-17 NOTE — Patient Instructions (Signed)

## 2018-08-20 ENCOUNTER — Other Ambulatory Visit: Payer: Self-pay | Admitting: Cardiology

## 2018-08-23 DIAGNOSIS — E291 Testicular hypofunction: Secondary | ICD-10-CM | POA: Diagnosis not present

## 2018-08-24 DIAGNOSIS — Z8739 Personal history of other diseases of the musculoskeletal system and connective tissue: Secondary | ICD-10-CM | POA: Diagnosis not present

## 2018-08-24 DIAGNOSIS — I4891 Unspecified atrial fibrillation: Secondary | ICD-10-CM | POA: Diagnosis not present

## 2018-08-24 DIAGNOSIS — Z6836 Body mass index (BMI) 36.0-36.9, adult: Secondary | ICD-10-CM | POA: Diagnosis not present

## 2018-08-25 ENCOUNTER — Other Ambulatory Visit: Payer: Self-pay | Admitting: Cardiology

## 2018-08-26 ENCOUNTER — Telehealth: Payer: Self-pay | Admitting: Emergency Medicine

## 2018-08-26 NOTE — Telephone Encounter (Signed)
Katie from patient's primary care office called asking about diagnosis of polycythemia. Informed patient Dr. Bing Matter entered this diagnosis based on lab work from 02/16/2018. Informed Katie of this and she will have pcp follow up on this

## 2018-09-13 ENCOUNTER — Ambulatory Visit: Payer: Self-pay | Admitting: Psychiatry

## 2018-09-14 DIAGNOSIS — E291 Testicular hypofunction: Secondary | ICD-10-CM | POA: Diagnosis not present

## 2018-09-27 ENCOUNTER — Other Ambulatory Visit: Payer: Medicare Other

## 2018-10-03 ENCOUNTER — Other Ambulatory Visit: Payer: Medicare Other

## 2018-10-05 DIAGNOSIS — E291 Testicular hypofunction: Secondary | ICD-10-CM | POA: Diagnosis not present

## 2018-10-26 DIAGNOSIS — E291 Testicular hypofunction: Secondary | ICD-10-CM | POA: Diagnosis not present

## 2018-10-27 DIAGNOSIS — H524 Presbyopia: Secondary | ICD-10-CM | POA: Diagnosis not present

## 2018-10-27 DIAGNOSIS — H52223 Regular astigmatism, bilateral: Secondary | ICD-10-CM | POA: Diagnosis not present

## 2018-10-27 DIAGNOSIS — H401131 Primary open-angle glaucoma, bilateral, mild stage: Secondary | ICD-10-CM | POA: Diagnosis not present

## 2018-10-27 DIAGNOSIS — H5201 Hypermetropia, right eye: Secondary | ICD-10-CM | POA: Diagnosis not present

## 2018-11-16 DIAGNOSIS — E291 Testicular hypofunction: Secondary | ICD-10-CM | POA: Diagnosis not present

## 2018-11-29 ENCOUNTER — Other Ambulatory Visit: Payer: Self-pay | Admitting: Psychiatry

## 2018-11-30 NOTE — Telephone Encounter (Signed)
Not seen in epic need to review paper chart  

## 2018-12-02 NOTE — Telephone Encounter (Signed)
Not been seen since 08/2017 Will let front know to set up an appt. He's due yearly

## 2018-12-05 ENCOUNTER — Other Ambulatory Visit: Payer: Self-pay | Admitting: Psychiatry

## 2018-12-05 MED ORDER — LITHIUM CARBONATE ER 300 MG PO TBCR: 300.0000 mg | EXTENDED_RELEASE_TABLET | Freq: Two times a day (BID) | ORAL | 0 refills | Status: DC

## 2018-12-07 DIAGNOSIS — E291 Testicular hypofunction: Secondary | ICD-10-CM | POA: Diagnosis not present

## 2018-12-14 DIAGNOSIS — E7800 Pure hypercholesterolemia, unspecified: Secondary | ICD-10-CM | POA: Diagnosis not present

## 2018-12-14 DIAGNOSIS — Z0000 Encounter for general adult medical examination without abnormal findings: Secondary | ICD-10-CM | POA: Diagnosis not present

## 2018-12-14 DIAGNOSIS — R740 Nonspecific elevation of levels of transaminase and lactic acid dehydrogenase [LDH]: Secondary | ICD-10-CM | POA: Diagnosis not present

## 2018-12-14 DIAGNOSIS — Z6834 Body mass index (BMI) 34.0-34.9, adult: Secondary | ICD-10-CM | POA: Diagnosis not present

## 2018-12-19 DIAGNOSIS — E7800 Pure hypercholesterolemia, unspecified: Secondary | ICD-10-CM | POA: Diagnosis not present

## 2018-12-19 DIAGNOSIS — I482 Chronic atrial fibrillation, unspecified: Secondary | ICD-10-CM | POA: Diagnosis not present

## 2018-12-19 DIAGNOSIS — Z7901 Long term (current) use of anticoagulants: Secondary | ICD-10-CM | POA: Diagnosis not present

## 2018-12-19 DIAGNOSIS — R2681 Unsteadiness on feet: Secondary | ICD-10-CM | POA: Diagnosis not present

## 2018-12-28 DIAGNOSIS — E291 Testicular hypofunction: Secondary | ICD-10-CM | POA: Diagnosis not present

## 2019-01-18 DIAGNOSIS — E291 Testicular hypofunction: Secondary | ICD-10-CM | POA: Diagnosis not present

## 2019-01-19 ENCOUNTER — Other Ambulatory Visit: Payer: Self-pay

## 2019-01-19 ENCOUNTER — Ambulatory Visit
Admission: RE | Admit: 2019-01-19 | Discharge: 2019-01-19 | Disposition: A | Discharge status: Home / Self Care | Payer: Medicare Other | Source: Ambulatory Visit | Attending: Urology | Admitting: Urology

## 2019-01-19 DIAGNOSIS — M810 Age-related osteoporosis without current pathological fracture: Secondary | ICD-10-CM

## 2019-01-19 DIAGNOSIS — Z13820 Encounter for screening for osteoporosis: Secondary | ICD-10-CM | POA: Diagnosis not present

## 2019-02-02 ENCOUNTER — Ambulatory Visit (INDEPENDENT_AMBULATORY_CARE_PROVIDER_SITE_OTHER): Payer: Medicare Other | Admitting: Psychiatry

## 2019-02-02 ENCOUNTER — Encounter: Payer: Self-pay | Admitting: Psychiatry

## 2019-02-02 DIAGNOSIS — G3184 Mild cognitive impairment, so stated: Secondary | ICD-10-CM | POA: Diagnosis not present

## 2019-02-02 DIAGNOSIS — F319 Bipolar disorder, unspecified: Secondary | ICD-10-CM

## 2019-02-02 MED ORDER — LITHIUM CARBONATE ER 450 MG PO TBCR: 450.0000 mg | EXTENDED_RELEASE_TABLET | Freq: Every evening | ORAL | 1 refills | Status: DC
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
## 2019-02-15 DIAGNOSIS — E291 Testicular hypofunction: Secondary | ICD-10-CM | POA: Diagnosis not present

## 2019-03-08 DIAGNOSIS — E291 Testicular hypofunction: Secondary | ICD-10-CM | POA: Diagnosis not present

## 2019-03-13 DIAGNOSIS — H524 Presbyopia: Secondary | ICD-10-CM | POA: Diagnosis not present

## 2019-03-29 DIAGNOSIS — E291 Testicular hypofunction: Secondary | ICD-10-CM | POA: Diagnosis not present

## 2019-04-04 DIAGNOSIS — Z23 Encounter for immunization: Secondary | ICD-10-CM | POA: Diagnosis not present

## 2019-04-07 DIAGNOSIS — L84 Corns and callosities: Secondary | ICD-10-CM | POA: Diagnosis not present

## 2019-04-07 DIAGNOSIS — M79672 Pain in left foot: Secondary | ICD-10-CM | POA: Diagnosis not present

## 2019-04-07 DIAGNOSIS — M79671 Pain in right foot: Secondary | ICD-10-CM | POA: Diagnosis not present

## 2019-04-07 DIAGNOSIS — B351 Tinea unguium: Secondary | ICD-10-CM | POA: Diagnosis not present

## 2019-04-19 DIAGNOSIS — E291 Testicular hypofunction: Secondary | ICD-10-CM | POA: Diagnosis not present

## 2019-05-10 DIAGNOSIS — E291 Testicular hypofunction: Secondary | ICD-10-CM | POA: Diagnosis not present

## 2019-05-18 ENCOUNTER — Other Ambulatory Visit: Payer: Self-pay | Admitting: Cardiology

## 2019-05-19 NOTE — Telephone Encounter (Signed)
Diltiazem and eliquis refills sent to [facility], overdue for f/u appt.

## 2019-05-31 DIAGNOSIS — E291 Testicular hypofunction: Secondary | ICD-10-CM | POA: Diagnosis not present

## 2019-06-19 ENCOUNTER — Other Ambulatory Visit: Payer: Self-pay | Admitting: Cardiology

## 2019-06-21 DIAGNOSIS — E291 Testicular hypofunction: Secondary | ICD-10-CM | POA: Diagnosis not present

## 2019-07-12 DIAGNOSIS — E291 Testicular hypofunction: Secondary | ICD-10-CM | POA: Diagnosis not present

## 2019-07-18 ENCOUNTER — Other Ambulatory Visit: Payer: Self-pay | Admitting: Cardiology

## 2019-08-02 ENCOUNTER — Ambulatory Visit (INDEPENDENT_AMBULATORY_CARE_PROVIDER_SITE_OTHER): Payer: Medicare Other | Admitting: Psychiatry

## 2019-08-02 ENCOUNTER — Encounter: Payer: Self-pay | Admitting: Psychiatry

## 2019-08-02 DIAGNOSIS — F319 Bipolar disorder, unspecified: Secondary | ICD-10-CM | POA: Diagnosis not present

## 2019-08-02 DIAGNOSIS — G3184 Mild cognitive impairment, so stated: Secondary | ICD-10-CM | POA: Diagnosis not present

## 2019-08-02 DIAGNOSIS — E291 Testicular hypofunction: Secondary | ICD-10-CM | POA: Diagnosis not present

## 2019-08-02 NOTE — Progress Notes (Signed)
Sean Bishop [id number] [birth year] 86 y.o.  Virtual Visit via [place]  I connected with pt by WebEx and verified that I am speaking with the correct person using two identifiers.   I discussed the limitations, risks, security and privacy concerns of performing an evaluation and management service by Jackquline Denmark and the availability of in person appointments. I also discussed with the patient that there may be a patient responsible charge related to this service. The patient expressed understanding and agreed to proceed.  I discussed the assessment and treatment plan with the patient. The patient was provided an opportunity to ask questions and all were answered. The patient agreed with the plan and demonstrated an understanding of the instructions.   The patient was advised to call back or seek an in-person evaluation if the symptoms worsen or if the condition fails to improve as anticipated.  I provided 30 minutes of video time during this encounter. The call started at 11:30 AM and ended at noon. The patient was located at home and the provider was located office.  Pt discouraged from leaving the facility by [facility]   Subjective:   Patient ID:  Sean Bishop is a 86 y.o. (DOB Dec 11, 1932) male.  Chief Complaint:  Chief Complaint  Patient presents with  . Follow-up    Medication Management and med change  . Other    Bipolar 1    HPI Sean Bishop presents for follow-up of bipolar disorder.  Last seen August 2020.  He was having balance problems and lithium was reduced from 600 mg daily to 450 mg daily.  He was encouraged to get a lithium level as soon as possible.  Overall doing very well.  Handling lockdown pretty well.  Couldn't go much of anywhere from the facility.  Missed socialization.  They are staying in [facility].  Not a bad facility.  No complaints with it.   No sig exercise.  Some walking in the building.  Got both Covid vaccines.  Son Marine scientist in  [place].  No sig tremor.  Some walking problems.  No med swings since reduction in lithium. Patient reports stable mood and denies depressed or irritable moods.  Patient denies any recent difficulty with anxiety.  Patient denies difficulty with sleep initiation or maintenance. Denies appetite disturbance.  Patient reports that energy and motivation have been good.  Patient reports some difficulty with concentration and memory.  Patient denies any suicidal ideation.  Aware of cognitive problems.  . Tolerating meds.  Wife [name].  PCP Earle Gell advised against driving bc history of syncope.  Past Psychiatric Medication Trials:  Decades of Lithium,  lorazepam  Review of Systems:  Review of Systems  Constitutional: Negative for fatigue.  Musculoskeletal: Positive for arthralgias, back pain and gait problem.  Neurological: Positive for weakness.  Psychiatric/Behavioral: Positive for decreased concentration. Negative for agitation, behavioral problems, confusion, dysphoric mood, hallucinations, self-injury and suicidal ideas. The patient is not nervous/anxious and is not hyperactive.     Medications: I have reviewed the patient's current medications.  Current Outpatient Medications  Medication Sig Dispense Refill  . acetaminophen (TYLENOL) 500 MG tablet Take 500 mg by mouth 2 (two) times daily.     . Ascorbic Acid (VITAMIN C PO) Take 1 tablet by mouth daily.    Marland Kitchen diltiazem (CARDIZEM CD) 120 MG 24 hr capsule TAKE (1) CAPSULE DAILY. 30 capsule 0  . dorzolamide-timolol (COSOPT) 22.3-6.8 MG/ML ophthalmic solution Place 1 drop into both eyes every morning.    Marland Kitchen  ELIQUIS 5 MG TABS tablet TAKE 1 TABLET BY MOUTH TWICE DAILY. 60 tablet 0  . latanoprost (XALATAN) 0.005 % ophthalmic solution Place 1 drop into both eyes at bedtime.    Marland Kitchen lithium carbonate (ESKALITH) 450 MG CR tablet Take 1 tablet (450 mg total) by mouth every evening. 90 tablet 1  . Multiple Vitamin (MULTIVITAMIN WITH MINERALS)  TABS tablet Take 1 tablet by mouth daily.    . timolol (TIMOPTIC) 0.5 % ophthalmic solution      No current facility-administered medications for this visit.    Medication Side Effects: None  Allergies:  Allergies  Allergen Reactions  . Lipitor [Atorvastatin] Other (See Comments)    Joint soreness    Past Medical History:  Diagnosis Date  . Atrial fibrillation (HCC)   . Bipolar 1 disorder (HCC)   . DJD (degenerative joint disease)   . Hypertension   . Mitral regurgitation    mild   . Renal insufficiency   . Stroke (HCC)   . Tremors of nervous system 01/2018    Family History  Problem Relation Age of Onset  . Alcohol abuse Mother   . Cancer Father     Social History   Socioeconomic History  . Marital status: Married    Spouse name: Not on file  . Number of children: Not on file  . Years of education: Not on file  . Highest education level: Not on file  Occupational History  . Not on file  Tobacco Use  . Smoking status: Former Games developer  . Smokeless tobacco: Never Used  Substance and Sexual Activity  . Alcohol use: Yes    Alcohol/week: 1.0 standard drinks    Types: 1 Glasses of wine per week    Comment: 1 per month  . Drug use: No  . Sexual activity: Not on file  Other Topics Concern  . Not on file  Social History Narrative  . Not on file   Social Determinants of Health   Financial Resource Strain:   . Difficulty of Paying Living Expenses: Not on file  Food Insecurity:   . Worried About Programme researcher, broadcasting/film/video in the Last Year: Not on file  . Ran Out of Food in the Last Year: Not on file  Transportation Needs:   . Lack of Transportation (Medical): Not on file  . Lack of Transportation (Non-Medical): Not on file  Physical Activity:   . Days of Exercise per Week: Not on file  . Minutes of Exercise per Session: Not on file  Stress:   . Feeling of Stress : Not on file  Social Connections:   . Frequency of Communication with Friends and Family: Not on file   . Frequency of Social Gatherings with Friends and Family: Not on file  . Attends Religious Services: Not on file  . Active Member of Clubs or Organizations: Not on file  . Attends Banker Meetings: Not on file  . Marital Status: Not on file  Intimate Partner Violence:   . Fear of Current or Ex-Partner: Not on file  . Emotionally Abused: Not on file  . Physically Abused: Not on file  . Sexually Abused: Not on file    Past Medical History, Surgical history, Social history, and Family history were reviewed and updated as appropriate.   Please see review of systems for further details on the patient's review from today.   Objective:   Physical Exam:  There were no vitals taken for this visit.  Physical  Exam Neurological:     Mental Status: He is alert and oriented to person, place, and time.     Cranial Nerves: No dysarthria.  Psychiatric:        Attention and Perception: Attention normal.        Mood and Affect: Mood is not anxious, depressed or elated.        Speech: Speech is not slurred.        Behavior: Behavior is cooperative.        Thought Content: Thought content normal. Thought content is not paranoid or delusional. Thought content does not include homicidal or suicidal ideation. Thought content does not include homicidal or suicidal plan.        Cognition and Memory: Cognition is impaired. He exhibits impaired recent memory.        Judgment: Judgment normal.     Comments: Talkative with poor focus about death of his problematic daughter.  Somewhat difficult to direct but did ask questions.      Lab Review:     Component Value Date/Time   NA 140 02/17/2018 0129   K 4.5 02/17/2018 0129   CL 110 02/17/2018 0129   CO2 19 (L) 02/17/2018 0129   GLUCOSE 93 02/17/2018 0129   BUN 12 02/17/2018 0129   CREATININE 1.07 02/17/2018 0129   CALCIUM 9.2 02/17/2018 0129   PROT 7.2 02/17/2018 0129   ALBUMIN 3.6 02/17/2018 0129   AST 33 02/17/2018 0129   ALT 21  02/17/2018 0129   ALKPHOS 67 02/17/2018 0129   BILITOT 1.9 (H) 02/17/2018 0129   GFRNONAA >60 02/17/2018 0129   GFRAA >60 02/17/2018 0129       Component Value Date/Time   WBC 8.3 02/17/2018 0129   RBC 5.39 02/17/2018 0129   HGB 16.5 02/17/2018 0129   HCT 52.1 (H) 02/17/2018 0129   PLT 194 02/17/2018 0129   MCV 96.7 02/17/2018 0129   MCH 30.6 02/17/2018 0129   MCHC 31.7 02/17/2018 0129   RDW 13.2 02/17/2018 0129   LYMPHSABS 1.3 02/16/2018 1441   MONOABS 1.0 02/16/2018 1441   EOSABS 0.0 02/16/2018 1441   BASOSABS 0.1 02/16/2018 1441    Lithium Lvl  Date Value Ref Range Status  02/17/2018 0.38 (L) 0.60 - 1.20 mmol/L Final    Comment:    Performed at [facility], [address], [place], [place] [address]     No results found for: Jonathon Jordan, VALPROATE, CBMZ   Labs July 07, 2018 incl Li 0.9,  December 14, 2018 = Cr 1.29, Calcium 10.8, CMP normal  Normal lipids and PTH   .res Assessment: Plan:    Sean Bishop was seen today for follow-up and other.  Diagnoses and all orders for this visit:  Bipolar I disorder (HCC)  Mild cognitive impairment  Supportive therapy dealing with age related problems and health and more recently Covid isolation.  No worsening of his health since his last visit..  Not regained full capacity.  Cannot drive anymore DT aging.  Labs from January, June 2020 suggest may need to reduce the lithium DT aging kidneys. Cr and calcium a bit higher than desired.  Bipolar has been stable for years.  No mood swings nor depression.  No problems with any worsening of mood since the reduction in lithium to the current dose of 450 mg daily.  He is not sure if it helped his balance or not but he is not complaining of any major balance problems at the moment. Counseled  patient regarding potential benefits, risks, and side effects of lithium to include potential risk of lithium affecting thyroid and renal function.  Discussed need for periodic lab  monitoring to determine drug level and to assess for potential adverse effects.  Counseled patient regarding signs and symptoms of lithium toxicity and advised that they notify office immediately or seek urgent medical attention if experiencing these signs and symptoms.  Patient advised to contact office with any questions or concerns.  Continue lithium to CR 450 1 each evening.   No med changes today  We need labs as soon as possible including lithium level and BMP and TSH.  Mild cognitive impairment is ongoing.  He cannot remember things such as when he had his last physical exam.  He does believe he had a physical however within the last year.  Sent a note to his primary care physician requesting copy of his past labs.  Sometimes they will obtain lithium level.  If the lithium level is not in those labs then we will order labs.  There is some concern because the patient's not driving and he is not sure how he would get the laboratory test accomplished.  This appt was 30 mins.  FU 6 mos or earlier PRN.  Meredith Staggers, MD, DFAPA   Consider reducing lithium Please see After Visit Summary for patient specific instructions.  Future Appointments  Date Time Provider Department Center  08/21/2019  3:15 PM Georgeanna Lea, MD [facility] None    No orders of the defined types were placed in this encounter.     -------------------------------

## 2019-08-18 ENCOUNTER — Other Ambulatory Visit: Payer: Self-pay | Admitting: Cardiology

## 2019-08-21 ENCOUNTER — Other Ambulatory Visit: Payer: Self-pay

## 2019-08-21 ENCOUNTER — Ambulatory Visit: Payer: Medicare Other | Admitting: Cardiology

## 2019-08-21 ENCOUNTER — Encounter: Payer: Self-pay | Admitting: Cardiology

## 2019-08-21 VITALS — BP 104/68 | HR 69 | Ht 66.0 in | Wt 214.1 lb

## 2019-08-21 DIAGNOSIS — I480 Paroxysmal atrial fibrillation: Secondary | ICD-10-CM | POA: Diagnosis not present

## 2019-08-21 DIAGNOSIS — F319 Bipolar disorder, unspecified: Secondary | ICD-10-CM | POA: Diagnosis not present

## 2019-08-21 DIAGNOSIS — I4821 Permanent atrial fibrillation: Secondary | ICD-10-CM

## 2019-08-21 DIAGNOSIS — R251 Tremor, unspecified: Secondary | ICD-10-CM

## 2019-08-21 DIAGNOSIS — I10 Essential (primary) hypertension: Secondary | ICD-10-CM

## 2019-08-21 NOTE — Patient Instructions (Signed)
Medication Instructions:  Your physician recommends that you continue on your current medications as directed. Please refer to the Current Medication list given to you today.  *If you need a refill on your cardiac medications before your next appointment, please call your pharmacy*  Lab Work: None. If you have labs (blood work) drawn today and your tests are completely normal, you will receive your results only by: Marland Kitchen MyChart Message (if you have MyChart) OR . A paper copy in the mail If you have any lab test that is abnormal or we need to change your treatment, we will call you to review the results.  Testing/Procedures: Your physician has requested that you have an echocardiogram. Echocardiography is a painless test that uses sound waves to create images of your heart. It provides your doctor with information about the size and shape of your heart and how well your heart's chambers and valves are working. This procedure takes approximately one hour. There are no restrictions for this procedure.     Follow-Up: At [facility], you and your health needs are our priority.  As part of our continuing mission to provide you with exceptional heart care, we have created designated Provider Care Teams.  These Care Teams include your primary Cardiologist (physician) and Advanced Practice Providers (APPs -  Physician Assistants and Nurse Practitioners) who all work together to provide you with the care you need, when you need it.  Your next appointment:   6 month(s)  The format for your next appointment:   In Person  Provider:   You may see No primary care provider on file. or the following Advanced Practice Provider on your designated Care Team:    Gillian Shields, FNP   Other Instructions   Echocardiogram An echocardiogram is a procedure that uses painless sound waves (ultrasound) to produce an image of the heart. Images from an echocardiogram can provide important information  about:  Signs of coronary artery disease (CAD).  Aneurysm detection. An aneurysm is a weak or damaged part of an artery wall that bulges out from the normal force of blood pumping through the body.  Heart size and shape. Changes in the size or shape of the heart can be associated with certain conditions, including heart failure, aneurysm, and CAD.  Heart muscle function.  Heart valve function.  Signs of a past heart attack.  Fluid buildup around the heart.  Thickening of the heart muscle.  A tumor or infectious growth around the heart valves. Tell a health care provider about:  Any allergies you have.  All medicines you are taking, including vitamins, herbs, eye drops, creams, and over-the-counter medicines.  Any blood disorders you have.  Any surgeries you have had.  Any medical conditions you have.  Whether you are pregnant or may be pregnant. What are the risks? Generally, this is a safe procedure. However, problems may occur, including:  Allergic reaction to dye (contrast) that may be used during the procedure. What happens before the procedure? No specific preparation is needed. You may eat and drink normally. What happens during the procedure?   An IV tube may be inserted into one of your veins.  You may receive contrast through this tube. A contrast is an injection that improves the quality of the pictures from your heart.  A gel will be applied to your chest.  A wand-like tool (transducer) will be moved over your chest. The gel will help to transmit the sound waves from the transducer.  The sound  waves will harmlessly bounce off of your heart to allow the heart images to be captured in real-time motion. The images will be recorded on a computer. The procedure may vary among health care providers and hospitals. What happens after the procedure?  You may return to your normal, everyday life, including diet, activities, and medicines, unless your health care  provider tells you not to do that. Summary  An echocardiogram is a procedure that uses painless sound waves (ultrasound) to produce an image of the heart.  Images from an echocardiogram can provide important information about the size and shape of your heart, heart muscle function, heart valve function, and fluid buildup around your heart.  You do not need to do anything to prepare before this procedure. You may eat and drink normally.  After the echocardiogram is completed, you may return to your normal, everyday life, unless your health care provider tells you not to do that. This information is not intended to replace advice given to you by your health care provider. Make sure you discuss any questions you have with your health care provider. Document Revised: 10/06/2018 Document Reviewed: 07/18/2016 Elsevier Patient Education  [place].

## 2019-08-21 NOTE — Progress Notes (Signed)
Cardiology Office Note:    Date:  08/21/2019   ID:  Sean Bishop, DOB 01-23-1933, MRN [id number]  PCP:  Juluis Rainier, MD  Cardiologist:  Gypsy Balsam, MD    Referring MD: Juluis Rainier, MD   No chief complaint on file. Doing well  History of Present Illness:    Sean Bishop is a 86 y.o. male with past medical history significant for permanent atrial fibrillation, essential hypertension, mitral regurgitation, history of CVA.  Comes today to my office for follow-up he complains of balance problem.  He used to exercise on the regular basis but lately he cannot do it he is afraid that he will fell down.  We talked in length about this I recommended him to join some rehab program so he can start some structural exercise and controlled environment.  He said he will think it over.  Denies having any palpitations, no cardiac complaints.  No shortness of breath no chest pain no swelling of lower extremities.  Past Medical History:  Diagnosis Date  . Atrial fibrillation (HCC)   . Bipolar 1 disorder (HCC)   . DJD (degenerative joint disease)   . Hypertension   . Mitral regurgitation    mild   . Renal insufficiency   . Stroke (HCC)   . Tremors of nervous system 01/2018    Past Surgical History:  Procedure Laterality Date  . SHOULDER SURGERY      Current Medications: Current Meds  Medication Sig  . acetaminophen (TYLENOL) 500 MG tablet Take 500 mg by mouth 2 (two) times daily.   . Ascorbic Acid (VITAMIN C PO) Take 1 tablet by mouth daily.  Marland Kitchen diltiazem (CARDIZEM CD) 120 MG 24 hr capsule TAKE (1) CAPSULE DAILY.  Marland Kitchen ELIQUIS 5 MG TABS tablet TAKE 1 TABLET BY MOUTH TWICE DAILY.  Marland Kitchen ibandronate (BONIVA) 150 MG tablet Take 150 mg by mouth every 30 (thirty) days.  Marland Kitchen latanoprost (XALATAN) 0.005 % ophthalmic solution Place 1 drop into both eyes at bedtime.  Marland Kitchen lithium carbonate (ESKALITH) 450 MG CR tablet Take 1 tablet (450 mg total) by mouth every evening.  . Multiple Vitamin  (MULTIVITAMIN WITH MINERALS) TABS tablet Take 1 tablet by mouth daily.  Marland Kitchen testosterone cypionate (DEPOTESTOSTERONE CYPIONATE) 200 MG/ML injection Every 3 weeks  . timolol (TIMOPTIC) 0.5 % ophthalmic solution      Allergies:   Lipitor [atorvastatin]   Social History   Socioeconomic History  . Marital status: Married    Spouse name: Not on file  . Number of children: Not on file  . Years of education: Not on file  . Highest education level: Not on file  Occupational History  . Not on file  Tobacco Use  . Smoking status: Former Games developer  . Smokeless tobacco: Never Used  Substance and Sexual Activity  . Alcohol use: Yes    Alcohol/week: 1.0 standard drinks    Types: 1 Glasses of wine per week    Comment: 1 per month  . Drug use: No  . Sexual activity: Not on file  Other Topics Concern  . Not on file  Social History Narrative  . Not on file   Social Determinants of Health   Financial Resource Strain:   . Difficulty of Paying Living Expenses: Not on file  Food Insecurity:   . Worried About Programme researcher, broadcasting/film/video in the Last Year: Not on file  . Ran Out of Food in the Last Year: Not on file  Transportation Needs:   .  Lack of Transportation (Medical): Not on file  . Lack of Transportation (Non-Medical): Not on file  Physical Activity:   . Days of Exercise per Week: Not on file  . Minutes of Exercise per Session: Not on file  Stress:   . Feeling of Stress : Not on file  Social Connections:   . Frequency of Communication with Friends and Family: Not on file  . Frequency of Social Gatherings with Friends and Family: Not on file  . Attends Religious Services: Not on file  . Active Member of Clubs or Organizations: Not on file  . Attends Banker Meetings: Not on file  . Marital Status: Not on file     Family History: The patient's family history includes Alcohol abuse in his mother; Cancer in his father. ROS:   Please see the history of present illness.    All  14 point review of systems negative except as described per history of present illness  EKGs/Labs/Other Studies Reviewed:      Recent Labs: No results found for requested labs within last 8760 hours.  Recent Lipid Panel    Component Value Date/Time   CHOL  08/24/2009 0645    171        ATP III CLASSIFICATION:  <200     mg/dL   Desirable  175-102  mg/dL   Borderline High  >=585    mg/dL   High          TRIG 277 08/24/2009 0645   HDL 36 (L) 08/24/2009 0645   CHOLHDL 4.8 08/24/2009 0645   VLDL 24 08/24/2009 0645   LDLCALC (H) 08/24/2009 0645    111        Total Cholesterol/HDL:CHD Risk Coronary Heart Disease Risk Table                     Men   Women  1/2 Average Risk   3.4   3.3  Average Risk       5.0   4.4  2 X Average Risk   9.6   7.1  3 X Average Risk  23.4   11.0        Use the calculated Patient Ratio above and the CHD Risk Table to determine the patient's CHD Risk.        ATP III CLASSIFICATION (LDL):  <100     mg/dL   Optimal  824-235  mg/dL   Near or Above                    Optimal  130-159  mg/dL   Borderline  361-443  mg/dL   High  >154     mg/dL   Very High    Physical Exam:    VS:  BP 104/68   Pulse 69   Ht 5\' 6"  (1.676 m)   Wt 214 lb 1.3 oz (97.1 kg)   SpO2 97%   BMI 34.55 kg/m     Wt Readings from Last 3 Encounters:  08/21/19 214 lb 1.3 oz (97.1 kg)  08/17/18 213 lb 1.9 oz (96.7 kg)  03/09/18 223 lb (101.2 kg)     GEN:  Well nourished, well developed in no acute distress HEENT: Normal NECK: No JVD; No carotid bruits LYMPHATICS: No lymphadenopathy CARDIAC: Irregularly irregular, no murmurs, no rubs, no gallops RESPIRATORY:  Clear to auscultation without rales, wheezing or rhonchi  ABDOMEN: Soft, non-tender, non-distended MUSCULOSKELETAL:  No edema; No deformity  SKIN: Warm and  dry LOWER EXTREMITIES: no swelling NEUROLOGIC:  Alert and oriented x 3 PSYCHIATRIC:  Normal affect   ASSESSMENT:    1. AF (paroxysmal atrial fibrillation)  ([place])   2. Permanent atrial fibrillation ([place])   3. Tremor   4. Bipolar affective disorder, remission status unspecified ([place])   5. Essential hypertension    PLAN:    In order of problems listed above:  1. Permanent atrial fibrillation anticoagulated which I will continue.  Stable rate control.  EKG showed atrial fibrillation today with controlled ventricular rate 2. Tremor followed by internal medicine team.  Noted 3. Essential hypertension blood pressure well controlled continue present management. 4. Bipolar affective disorder followed by 10 medicine team.  Overall seems to be doing well.   Medication Adjustments/Labs and Tests Ordered: Current medicines are reviewed at length with the patient today.  Concerns regarding medicines are outlined above.  Orders Placed This Encounter  Procedures  . EKG 12-Lead  . ECHOCARDIOGRAM COMPLETE   Medication changes: No orders of the defined types were placed in this encounter.   Signed, Park Liter, MD, [facility] 08/21/2019 4:51 PM    [place]

## 2019-08-22 ENCOUNTER — Other Ambulatory Visit: Payer: Self-pay | Admitting: Cardiology

## 2019-08-23 DIAGNOSIS — E291 Testicular hypofunction: Secondary | ICD-10-CM | POA: Diagnosis not present

## 2019-08-31 ENCOUNTER — Other Ambulatory Visit: Payer: Self-pay

## 2019-08-31 ENCOUNTER — Ambulatory Visit (HOSPITAL_BASED_OUTPATIENT_CLINIC_OR_DEPARTMENT_OTHER)
Admission: RE | Admit: 2019-08-31 | Discharge: 2019-08-31 | Disposition: A | Discharge status: Home / Self Care | Payer: Medicare Other | Source: Ambulatory Visit | Attending: Cardiology | Admitting: Cardiology

## 2019-08-31 DIAGNOSIS — I480 Paroxysmal atrial fibrillation: Secondary | ICD-10-CM | POA: Insufficient documentation

## 2019-08-31 NOTE — Progress Notes (Signed)
  Echocardiogram 2D Echocardiogram has been performed.  Leta Jungling M 08/31/2019, 2:50 PM

## 2019-09-16 ENCOUNTER — Other Ambulatory Visit: Payer: Self-pay | Admitting: Cardiology

## 2019-09-19 ENCOUNTER — Other Ambulatory Visit: Payer: Self-pay | Admitting: *Deleted

## 2019-09-19 DIAGNOSIS — E291 Testicular hypofunction: Secondary | ICD-10-CM | POA: Diagnosis not present

## 2019-09-19 MED ORDER — APIXABAN 5 MG PO TABS: 5.0000 mg | ORAL_TABLET | Freq: Two times a day (BID) | ORAL | 3 refills | Status: DC

## 2019-09-21 ENCOUNTER — Other Ambulatory Visit: Payer: Self-pay | Admitting: Psychiatry

## 2019-09-21 DIAGNOSIS — F319 Bipolar disorder, unspecified: Secondary | ICD-10-CM

## 2019-09-25 NOTE — Telephone Encounter (Signed)
Duplicate request, medication has already been refilled.

## 2019-10-06 DIAGNOSIS — M79671 Pain in right foot: Secondary | ICD-10-CM | POA: Diagnosis not present

## 2019-10-06 DIAGNOSIS — M79672 Pain in left foot: Secondary | ICD-10-CM | POA: Diagnosis not present

## 2019-10-06 DIAGNOSIS — L84 Corns and callosities: Secondary | ICD-10-CM | POA: Diagnosis not present

## 2019-10-06 DIAGNOSIS — B351 Tinea unguium: Secondary | ICD-10-CM | POA: Diagnosis not present

## 2019-10-11 DIAGNOSIS — E291 Testicular hypofunction: Secondary | ICD-10-CM | POA: Diagnosis not present

## 2019-10-17 DIAGNOSIS — E7800 Pure hypercholesterolemia, unspecified: Secondary | ICD-10-CM | POA: Diagnosis not present

## 2019-10-17 DIAGNOSIS — M810 Age-related osteoporosis without current pathological fracture: Secondary | ICD-10-CM | POA: Diagnosis not present

## 2019-10-17 DIAGNOSIS — F319 Bipolar disorder, unspecified: Secondary | ICD-10-CM | POA: Diagnosis not present

## 2019-10-17 DIAGNOSIS — I482 Chronic atrial fibrillation, unspecified: Secondary | ICD-10-CM | POA: Diagnosis not present

## 2019-11-03 DIAGNOSIS — E291 Testicular hypofunction: Secondary | ICD-10-CM | POA: Diagnosis not present

## 2019-11-03 DIAGNOSIS — N400 Enlarged prostate without lower urinary tract symptoms: Secondary | ICD-10-CM | POA: Diagnosis not present

## 2019-12-05 DIAGNOSIS — M5136 Other intervertebral disc degeneration, lumbar region: Secondary | ICD-10-CM | POA: Diagnosis not present

## 2019-12-21 ENCOUNTER — Other Ambulatory Visit: Payer: Self-pay | Admitting: Cardiology

## 2020-01-12 DIAGNOSIS — M79671 Pain in right foot: Secondary | ICD-10-CM | POA: Diagnosis not present

## 2020-01-12 DIAGNOSIS — L84 Corns and callosities: Secondary | ICD-10-CM | POA: Diagnosis not present

## 2020-01-12 DIAGNOSIS — B351 Tinea unguium: Secondary | ICD-10-CM | POA: Diagnosis not present

## 2020-01-12 DIAGNOSIS — M79672 Pain in left foot: Secondary | ICD-10-CM | POA: Diagnosis not present

## 2020-01-15 DIAGNOSIS — F319 Bipolar disorder, unspecified: Secondary | ICD-10-CM | POA: Diagnosis not present

## 2020-01-15 DIAGNOSIS — Z0000 Encounter for general adult medical examination without abnormal findings: Secondary | ICD-10-CM | POA: Diagnosis not present

## 2020-01-15 DIAGNOSIS — Z7901 Long term (current) use of anticoagulants: Secondary | ICD-10-CM | POA: Diagnosis not present

## 2020-01-15 DIAGNOSIS — E7800 Pure hypercholesterolemia, unspecified: Secondary | ICD-10-CM | POA: Diagnosis not present

## 2020-01-15 DIAGNOSIS — D6869 Other thrombophilia: Secondary | ICD-10-CM | POA: Diagnosis not present

## 2020-01-16 ENCOUNTER — Other Ambulatory Visit: Payer: Self-pay | Admitting: Family Medicine

## 2020-01-16 ENCOUNTER — Other Ambulatory Visit: Payer: Self-pay | Admitting: Cardiology

## 2020-01-16 DIAGNOSIS — M810 Age-related osteoporosis without current pathological fracture: Secondary | ICD-10-CM

## 2020-01-16 NOTE — Telephone Encounter (Signed)
Rx refill sent to pharmacy. 

## 2020-02-26 ENCOUNTER — Ambulatory Visit: Payer: Medicare Other | Admitting: Cardiology

## 2020-02-26 ENCOUNTER — Other Ambulatory Visit: Payer: Self-pay

## 2020-02-26 ENCOUNTER — Encounter: Payer: Self-pay | Admitting: Cardiology

## 2020-02-26 VITALS — BP 120/80 | HR 70 | Ht 66.0 in | Wt 218.0 lb

## 2020-02-26 DIAGNOSIS — R42 Dizziness and giddiness: Secondary | ICD-10-CM

## 2020-02-26 DIAGNOSIS — I4821 Permanent atrial fibrillation: Secondary | ICD-10-CM | POA: Diagnosis not present

## 2020-02-26 DIAGNOSIS — E785 Hyperlipidemia, unspecified: Secondary | ICD-10-CM

## 2020-02-26 DIAGNOSIS — F319 Bipolar disorder, unspecified: Secondary | ICD-10-CM

## 2020-02-26 HISTORY — DX: Hyperlipidemia, unspecified: E78.5

## 2020-02-26 HISTORY — DX: Dizziness and giddiness: R42

## 2020-02-26 MED ORDER — PRAVASTATIN SODIUM 20 MG PO TABS: 20.0000 mg | ORAL_TABLET | Freq: Every evening | ORAL | 1 refills | Status: DC

## 2020-02-26 NOTE — Addendum Note (Signed)
Addended by: Lita Mains on: 02/26/2020 10:57 AM   Modules accepted: Orders

## 2020-02-26 NOTE — Progress Notes (Signed)
Cardiology Office Note:    Date:  02/26/2020   ID:  BLANCA THORNTON, DOB 1932/11/19, MRN [id number]  PCP:  Juluis Rainier, MD  Cardiologist:  Gypsy Balsam, MD    Referring MD: Juluis Rainier, MD   Chief Complaint  Patient presents with   Follow-up  Doing fine  History of Present Illness:    Sean Bishop is a 86 y.o. male with past medical history significant for permanent atrial fibrillation, anticoagulated with Eliquis 5 mg dose is appropriate, essential hypertension, cardiomyopathy with ejection fraction 45 to 50%, dyslipidemia, bipolar disorder.  Comes today to my office for follow-up overall doing well he does have difficulty moving around because of unsteady gait.  He fell down couple years ago he is very scared after that.  But overall seems to be doing quite well.  Past Medical History:  Diagnosis Date   Atrial fibrillation (HCC)    Bipolar 1 disorder (HCC)    DJD (degenerative joint disease)    Hypertension    Mitral regurgitation    mild    Renal insufficiency    Stroke [facility])    Tremors of nervous system 01/2018    Past Surgical History:  Procedure Laterality Date   SHOULDER SURGERY      Current Medications: Current Meds  Medication Sig   acetaminophen (TYLENOL) 500 MG tablet Take 500 mg by mouth 2 (two) times daily.    Ascorbic Acid (VITAMIN C PO) Take 1 tablet by mouth daily.   diltiazem (CARDIZEM CD) 120 MG 24 hr capsule TAKE (1) CAPSULE DAILY.   ELIQUIS 5 MG TABS tablet TAKE 1 TABLET BY MOUTH TWICE DAILY.   ibandronate (BONIVA) 150 MG tablet Take 150 mg by mouth every 30 (thirty) days.   latanoprost (XALATAN) 0.005 % ophthalmic solution Place 1 drop into both eyes at bedtime.   lithium carbonate (ESKALITH) 450 MG CR tablet TAKE 1 TABLET IN THE P.M.   Multiple Vitamin (MULTIVITAMIN WITH MINERALS) TABS tablet Take 1 tablet by mouth daily.   testosterone cypionate (DEPOTESTOSTERONE CYPIONATE) 200 MG/ML injection Every 3 weeks    timolol (TIMOPTIC) 0.5 % ophthalmic solution      Allergies:   Lipitor [atorvastatin]   Social History   Socioeconomic History   Marital status: Married    Spouse name: Not on file   Number of children: Not on file   Years of education: Not on file   Highest education level: Not on file  Occupational History   Not on file  Tobacco Use   Smoking status: Former Smoker   Smokeless tobacco: Never Used  Building services engineer Use: Never used  Substance and Sexual Activity   Alcohol use: Yes    Alcohol/week: 1.0 standard drink    Types: 1 Glasses of wine per week    Comment: 1 per month   Drug use: No   Sexual activity: Not on file  Other Topics Concern   Not on file  Social History Narrative   Not on file   Social Determinants of Health   Financial Resource Strain:    Difficulty of Paying Living Expenses: Not on file  Food Insecurity:    Worried About Running Out of Food in the Last Year: Not on file   Ran Out of Food in the Last Year: Not on file  Transportation Needs:    Lack of Transportation (Medical): Not on file   Lack of Transportation (Non-Medical): Not on file  Physical Activity:    Days  of Exercise per Week: Not on file   Minutes of Exercise per Session: Not on file  Stress:    Feeling of Stress : Not on file  Social Connections:    Frequency of Communication with Friends and Family: Not on file   Frequency of Social Gatherings with Friends and Family: Not on file   Attends Religious Services: Not on file   Active Member of Clubs or Organizations: Not on file   Attends Banker Meetings: Not on file   Marital Status: Not on file     Family History: The patient's family history includes Alcohol abuse in his mother; Cancer in his father. ROS:   Please see the history of present illness.    All 14 point review of systems negative except as described per history of present illness  EKGs/Labs/Other Studies Reviewed:       Recent Labs: No results found for requested labs within last 8760 hours.  Recent Lipid Panel    Component Value Date/Time   CHOL  08/24/2009 0645    171        ATP III CLASSIFICATION:  <200     mg/dL   Desirable  161-096  mg/dL   Borderline High  >=045    mg/dL   High          TRIG 409 08/24/2009 0645   HDL 36 (L) 08/24/2009 0645   CHOLHDL 4.8 08/24/2009 0645   VLDL 24 08/24/2009 0645   LDLCALC (H) 08/24/2009 0645    111        Total Cholesterol/HDL:CHD Risk Coronary Heart Disease Risk Table                     Men   Women  1/2 Average Risk   3.4   3.3  Average Risk       5.0   4.4  2 X Average Risk   9.6   7.1  3 X Average Risk  23.4   11.0        Use the calculated Patient Ratio above and the CHD Risk Table to determine the patient's CHD Risk.        ATP III CLASSIFICATION (LDL):  <100     mg/dL   Optimal  811-914  mg/dL   Near or Above                    Optimal  130-159  mg/dL   Borderline  782-956  mg/dL   High  >213     mg/dL   Very High    Physical Exam:    VS:  BP 120/80 (BP Location: Left Arm, Patient Position: Sitting, Cuff Size: Normal)    Pulse 70    Ht 5\' 6"  (1.676 m)    Wt 218 lb (98.9 kg)    SpO2 97%    BMI 35.19 kg/m     Wt Readings from Last 3 Encounters:  02/26/20 218 lb (98.9 kg)  08/21/19 214 lb 1.3 oz (97.1 kg)  08/17/18 213 lb 1.9 oz (96.7 kg)     GEN:  Well nourished, well developed in no acute distress HEENT: Normal NECK: No JVD; No carotid bruits LYMPHATICS: No lymphadenopathy CARDIAC: Irregular irregular, no murmurs, no rubs, no gallops RESPIRATORY:  Clear to auscultation without rales, wheezing or rhonchi  ABDOMEN: Soft, non-tender, non-distended MUSCULOSKELETAL:  No edema; No deformity  SKIN: Warm and dry LOWER EXTREMITIES: no swelling NEUROLOGIC:  Alert and oriented  x 3 PSYCHIATRIC:  Normal affect   ASSESSMENT:    1. Permanent atrial fibrillation (HCC)   2. Bipolar affective disorder, remission status unspecified  (HCC)   3. Dizziness   4. Dyslipidemia    PLAN:    In order of problems listed above:  1. Permanent atrial fibrillation anticoagulated rate control with calcium channel blocker which I will continue. 2. Bipolar disorder, he is on lithium to be followed by internal medicine team and psychiatry.  Stable. 3. Dizziness/unsteady gait always a problem.  He denies having much dizziness but gait unsteadiness is still there.  I recommended him to exercise on the regular basis trying to improve at least strength which should help with this problem. 4. Cardiomyopathy his ejection fraction 45 to 50%.  Ideally we need to start ARB as well as potentially switching from diltiazem to beta-blocker, however he does not want to do it right now.  I was able to convince him to start taking small dose of statin because of his cholesterol being still elevated.  He is willing to try pravastatin 20 mg which I will do.  If he can tolerate this medication well then we will talk about potentially putting him on ARB and then may be on beta-blocker. 5. Dyslipidemia: I did review his K PN which showed me his LDL of 95 HDL 39 this is from July of this year.  Plan as outlined above I did calculate his risk is very high will start pravastatin 20 mg daily.   Medication Adjustments/Labs and Tests Ordered: Current medicines are reviewed at length with the patient today.  Concerns regarding medicines are outlined above.  No orders of the defined types were placed in this encounter.  Medication changes: No orders of the defined types were placed in this encounter.   Signed, Georgeanna Lea, MD, [facility] 02/26/2020 10:52 AM    [facility]

## 2020-02-26 NOTE — Patient Instructions (Signed)
Medication Instructions:  Your physician has recommended you make the following change in your medication:   START: Pravastatin 20 mg daily   *If you need a refill on your cardiac medications before your next appointment, please call your pharmacy*   Lab Work: None.  If you have labs (blood work) drawn today and your tests are completely normal, you will receive your results only by: Marland Kitchen MyChart Message (if you have MyChart) OR . A paper copy in the mail If you have any lab test that is abnormal or we need to change your treatment, we will call you to review the results.   Testing/Procedures: None.    Follow-Up: At [facility], you and your health needs are our priority.  As part of our continuing mission to provide you with exceptional heart care, we have created designated Provider Care Teams.  These Care Teams include your primary Cardiologist (physician) and Advanced Practice Providers (APPs -  Physician Assistants and Nurse Practitioners) who all work together to provide you with the care you need, when you need it.  We recommend signing up for the patient portal called "MyChart".  Sign up information is provided on this After Visit Summary.  MyChart is used to connect with patients for Virtual Visits (Telemedicine).  Patients are able to view lab/test results, encounter notes, upcoming appointments, etc.  Non-urgent messages can be sent to your provider as well.   To learn more about what you can do with MyChart, go to [email or web address].    Your next appointment:   6 month(s)  The format for your next appointment:   In Person  Provider:   Gypsy Balsam, MD   Other Instructions  Pravastatin tablets What is this medicine? PRAVASTATIN (PRA va stat in) is known as a HMG-CoA reductase inhibitor or 'statin'. It lowers the level of cholesterol and triglycerides in the blood. This drug may also reduce the risk of heart attack, stroke, or other health problems in  patients with risk factors for heart disease. Diet and lifestyle changes are often used with this drug. This medicine may be used for other purposes; ask your health care provider or pharmacist if you have questions. COMMON BRAND NAME(S): Pravachol What should I tell my health care provider before I take this medicine? They need to know if you have any of these conditions:  diabetes  if you often drink alcohol  history of stroke  kidney disease  liver disease  muscle aches or weakness  thyroid disease  an unusual or allergic reaction to pravastatin, other medicines, foods, dyes, or preservatives  pregnant or trying to get pregnant  breast-feeding How should I use this medicine? Take pravastatin tablets by mouth. Swallow the tablets with a drink of water. Pravastatin can be taken at anytime of the day, with or without food. Follow the directions on the prescription label. Take your doses at regular intervals. Do not take your medicine more often than directed. Talk to your pediatrician regarding the use of this medicine in children. Special care may be needed. Pravastatin has been used in children as young as 67 years of age. Overdosage: If you think you have taken too much of this medicine contact a poison control center or emergency room at once. NOTE: This medicine is only for you. Do not share this medicine with others. What if I miss a dose? If you miss a dose, take it as soon as you can. If it is almost time for your next dose, take only  that dose. Do not take double or extra doses. What may interact with this medicine? This medicine may interact with the following medications:  colchicine  cyclosporine  other medicines for high cholesterol  some antibiotics like azithromycin, clarithromycin, erythromycin, and telithromycin This list may not describe all possible interactions. Give your health care provider a list of all the medicines, herbs, non-prescription drugs, or  dietary supplements you use. Also tell them if you smoke, drink alcohol, or use illegal drugs. Some items may interact with your medicine. What should I watch for while using this medicine? Visit your doctor or health care professional for regular check-ups. You may need regular tests to make sure your liver is working properly. Your health care professional may tell you to stop taking this medicine if you develop muscle problems. If your muscle problems do not go away after stopping this medicine, contact your health care professional. Do not become pregnant while taking this medicine. Women should inform their health care professional if they wish to become pregnant or think they might be pregnant. There is a potential for serious side effects to an unborn child. Talk to your health care professional or pharmacist for more information. Do not breast-feed an infant while taking this medicine. This medicine may affect blood sugar levels. If you have diabetes, check with your doctor or health care professional before you change your diet or the dose of your diabetic medicine. If you are going to need surgery or other procedure, tell your doctor that you are using this medicine. This drug is only part of a total heart-health program. Your doctor or a dietician can suggest a low-cholesterol and low-fat diet to help. Avoid alcohol and smoking, and keep a proper exercise schedule. This medicine may cause a decrease in Co-Enzyme Q-10. You should make sure that you get enough Co-Enzyme Q-10 while you are taking this medicine. Discuss the foods you eat and the vitamins you take with your health care professional. What side effects may I notice from receiving this medicine? Side effects that you should report to your doctor or health care professional as soon as possible:  allergic reactions like skin rash, itching or hives, swelling of the face, lips, or tongue  dark urine  fever  muscle pain, cramps, or  weakness  redness, blistering, peeling or loosening of the skin, including inside the mouth  trouble passing urine or change in the amount of urine  unusually weak or tired  yellowing of the eyes or skin Side effects that usually do not require medical attention (report to your doctor or health care professional if they continue or are bothersome):  gas  headache  heartburn  indigestion  stomach pain This list may not describe all possible side effects. Call your doctor for medical advice about side effects. You may report side effects to FDA at 1-800-FDA-1088. Where should I keep my medicine? Keep out of the reach of children. Store at room temperature between 15 to 30 degrees C (59 to 86 degrees F). Protect from light. Keep container tightly closed. Throw away any unused medicine after the expiration date. NOTE: This sheet is a summary. It may not cover all possible information. If you have questions about this medicine, talk to your doctor, pharmacist, or health care provider.  2020 Elsevier/Gold Standard (2017-02-16 12:37:09)

## 2020-03-15 ENCOUNTER — Other Ambulatory Visit: Payer: Self-pay | Admitting: Cardiology

## 2020-03-15 NOTE — Telephone Encounter (Signed)
Please review request for Eliquis. KW 

## 2020-03-26 ENCOUNTER — Other Ambulatory Visit: Payer: Self-pay | Admitting: Psychiatry

## 2020-03-26 DIAGNOSIS — F319 Bipolar disorder, unspecified: Secondary | ICD-10-CM

## 2020-03-26 NOTE — Telephone Encounter (Signed)
review 

## 2020-04-17 DIAGNOSIS — M810 Age-related osteoporosis without current pathological fracture: Secondary | ICD-10-CM | POA: Diagnosis not present

## 2020-04-17 DIAGNOSIS — E7800 Pure hypercholesterolemia, unspecified: Secondary | ICD-10-CM | POA: Diagnosis not present

## 2020-04-17 DIAGNOSIS — F319 Bipolar disorder, unspecified: Secondary | ICD-10-CM | POA: Diagnosis not present

## 2020-04-17 DIAGNOSIS — I482 Chronic atrial fibrillation, unspecified: Secondary | ICD-10-CM | POA: Diagnosis not present

## 2020-06-12 DIAGNOSIS — H524 Presbyopia: Secondary | ICD-10-CM | POA: Diagnosis not present

## 2020-07-02 DIAGNOSIS — E291 Testicular hypofunction: Secondary | ICD-10-CM | POA: Diagnosis not present

## 2020-07-23 DIAGNOSIS — M810 Age-related osteoporosis without current pathological fracture: Secondary | ICD-10-CM | POA: Diagnosis not present

## 2020-07-23 DIAGNOSIS — E7800 Pure hypercholesterolemia, unspecified: Secondary | ICD-10-CM | POA: Diagnosis not present

## 2020-07-23 DIAGNOSIS — E291 Testicular hypofunction: Secondary | ICD-10-CM | POA: Diagnosis not present

## 2020-07-23 DIAGNOSIS — F319 Bipolar disorder, unspecified: Secondary | ICD-10-CM | POA: Diagnosis not present

## 2020-08-09 DIAGNOSIS — M79671 Pain in right foot: Secondary | ICD-10-CM | POA: Diagnosis not present

## 2020-08-09 DIAGNOSIS — B351 Tinea unguium: Secondary | ICD-10-CM | POA: Diagnosis not present

## 2020-08-09 DIAGNOSIS — M79672 Pain in left foot: Secondary | ICD-10-CM | POA: Diagnosis not present

## 2020-08-09 DIAGNOSIS — L84 Corns and callosities: Secondary | ICD-10-CM | POA: Diagnosis not present

## 2020-08-13 DIAGNOSIS — E291 Testicular hypofunction: Secondary | ICD-10-CM | POA: Diagnosis not present

## 2020-08-26 DIAGNOSIS — I340 Nonrheumatic mitral (valve) insufficiency: Secondary | ICD-10-CM | POA: Insufficient documentation

## 2020-08-26 DIAGNOSIS — Z8673 Personal history of transient ischemic attack (TIA), and cerebral infarction without residual deficits: Secondary | ICD-10-CM | POA: Insufficient documentation

## 2020-08-26 DIAGNOSIS — M159 Polyosteoarthritis, unspecified: Secondary | ICD-10-CM | POA: Insufficient documentation

## 2020-08-26 DIAGNOSIS — F319 Bipolar disorder, unspecified: Secondary | ICD-10-CM | POA: Insufficient documentation

## 2020-08-26 DIAGNOSIS — M1990 Unspecified osteoarthritis, unspecified site: Secondary | ICD-10-CM | POA: Insufficient documentation

## 2020-08-26 DIAGNOSIS — I639 Cerebral infarction, unspecified: Secondary | ICD-10-CM | POA: Insufficient documentation

## 2020-09-02 ENCOUNTER — Other Ambulatory Visit: Payer: Self-pay

## 2020-09-02 ENCOUNTER — Ambulatory Visit: Payer: Medicare Other | Admitting: Cardiology

## 2020-09-02 ENCOUNTER — Encounter: Payer: Self-pay | Admitting: Cardiology

## 2020-09-02 VITALS — BP 120/72 | HR 75 | Ht 66.0 in | Wt 221.0 lb

## 2020-09-02 DIAGNOSIS — I340 Nonrheumatic mitral (valve) insufficiency: Secondary | ICD-10-CM | POA: Diagnosis not present

## 2020-09-02 DIAGNOSIS — I480 Paroxysmal atrial fibrillation: Secondary | ICD-10-CM | POA: Diagnosis not present

## 2020-09-02 DIAGNOSIS — F319 Bipolar disorder, unspecified: Secondary | ICD-10-CM

## 2020-09-02 DIAGNOSIS — E785 Hyperlipidemia, unspecified: Secondary | ICD-10-CM

## 2020-09-02 DIAGNOSIS — I4821 Permanent atrial fibrillation: Secondary | ICD-10-CM

## 2020-09-02 NOTE — Patient Instructions (Signed)
Medication Instructions:  Your physician recommends that you continue on your current medications as directed. Please refer to the Current Medication list given to you today.  *If you need a refill on your cardiac medications before your next appointment, please call your pharmacy*   Lab Work: Your physician recommends that you return for lab work today: lipid, lft, direct ldl If you have labs (blood work) drawn today and your tests are completely normal, you will receive your results only by: Marland Kitchen MyChart Message (if you have MyChart) OR . A paper copy in the mail If you have any lab test that is abnormal or we need to change your treatment, we will call you to review the results.   Testing/Procedures: None   Follow-Up: At [facility], you and your health needs are our priority.  As part of our continuing mission to provide you with exceptional heart care, we have created designated Provider Care Teams.  These Care Teams include your primary Cardiologist (physician) and Advanced Practice Providers (APPs -  Physician Assistants and Nurse Practitioners) who all work together to provide you with the care you need, when you need it.  We recommend signing up for the patient portal called "MyChart".  Sign up information is provided on this After Visit Summary.  MyChart is used to connect with patients for Virtual Visits (Telemedicine).  Patients are able to view lab/test results, encounter notes, upcoming appointments, etc.  Non-urgent messages can be sent to your provider as well.   To learn more about what you can do with MyChart, go to [email or web address].    Your next appointment:   6 month(s)  The format for your next appointment:   In Person  Provider:   Gypsy Balsam, MD   Other Instructions

## 2020-09-02 NOTE — Progress Notes (Signed)
Cardiology Office Note:    Date:  09/02/2020   ID:  Sean Bishop, DOB [birth year], MRN [id number]  PCP:  Juluis Rainier, MD  Cardiologist:  Gypsy Balsam, MD    Referring MD: Juluis Rainier, MD   Chief Complaint  Patient presents with  . Follow-up  I am doing fine  History of Present Illness:    Sean Bishop is a 87 y.o. male with past medical history significant for permanent atrial fibrillation, anticoagulated Eliquis, essential hypertension, cardiomyopathy with ejection fraction member of 50%, dyslipidemia, bipolar disorder, mitral regurgitation.  He comes today 2 months of follow-up with his son who is a Engineer, civil (consulting).  Overall he is doing well he started to be L be more active seems to be very optimistic and talks a lot today.  Denies have any chest pain tightness squeezing pressure burning chest no palpitations no dizziness no passing out  Past Medical History:  Diagnosis Date  . AF (paroxysmal atrial fibrillation) (HCC) 02/16/2018  . Atrial fibrillation (HCC)   . Atrial fibrillation with RVR (HCC) 02/16/2018  . Bipolar 1 disorder (HCC)   . Bipolar disorder (HCC) 02/17/2018  . Dizziness 02/26/2020  . DJD (degenerative joint disease)   . Dyslipidemia 02/26/2020  . Gait instability 02/16/2018  . Hypertension   . Mitral regurgitation    mild   . Polycythemia 02/16/2018  . Renal insufficiency   . Stroke (HCC)   . Tremor 02/16/2018  . Tremors of nervous system 01/2018    Past Surgical History:  Procedure Laterality Date  . SHOULDER SURGERY      Current Medications: Current Meds  Medication Sig  . acetaminophen (TYLENOL) 500 MG tablet Take 500 mg by mouth 2 (two) times daily.   . Ascorbic Acid (VITAMIN C PO) Take 1 tablet by mouth daily.  Marland Kitchen diltiazem (CARDIZEM CD) 120 MG 24 hr capsule TAKE (1) CAPSULE DAILY.  Marland Kitchen ELIQUIS 5 MG TABS tablet TAKE 1 TABLET BY MOUTH TWICE DAILY.  Marland Kitchen ibandronate (BONIVA) 150 MG tablet Take 150 mg by mouth every 30 (thirty) days.  Marland Kitchen latanoprost  (XALATAN) 0.005 % ophthalmic solution Place 1 drop into both eyes at bedtime.  Marland Kitchen lithium carbonate (ESKALITH) 450 MG CR tablet TAKE 1 TABLET IN THE P.M.  . Multiple Vitamin (MULTIVITAMIN WITH MINERALS) TABS tablet Take 1 tablet by mouth daily.  Marland Kitchen testosterone cypionate (DEPOTESTOSTERONE CYPIONATE) 200 MG/ML injection Every 3 weeks  . timolol (TIMOPTIC) 0.5 % ophthalmic solution      Allergies:   Lipitor [atorvastatin]   Social History   Socioeconomic History  . Marital status: Married    Spouse name: Not on file  . Number of children: Not on file  . Years of education: Not on file  . Highest education level: Not on file  Occupational History  . Not on file  Tobacco Use  . Smoking status: Former Games developer  . Smokeless tobacco: Never Used  Vaping Use  . Vaping Use: Never used  Substance and Sexual Activity  . Alcohol use: Yes    Alcohol/week: 1.0 standard drink    Types: 1 Glasses of wine per week    Comment: 1 per month  . Drug use: No  . Sexual activity: Not on file  Other Topics Concern  . Not on file  Social History Narrative  . Not on file   Social Determinants of Health   Financial Resource Strain: Not on file  Food Insecurity: Not on file  Transportation Needs: Not on file  Physical  Activity: Not on file  Stress: Not on file  Social Connections: Not on file     Family History: The patient's family history includes Alcohol abuse in his mother; Cancer in his father. ROS:   Please see the history of present illness.    All 14 point review of systems negative except as described per history of present illness  EKGs/Labs/Other Studies Reviewed:      Recent Labs: No results found for requested labs within last 8760 hours.  Recent Lipid Panel    Component Value Date/Time   CHOL  08/24/2009 0645    171        ATP III CLASSIFICATION:  <200     mg/dL   Desirable  604-540  mg/dL   Borderline High  >=981    mg/dL   High          TRIG 191 08/24/2009 0645    HDL 36 (L) 08/24/2009 0645   CHOLHDL 4.8 08/24/2009 0645   VLDL 24 08/24/2009 0645   LDLCALC (H) 08/24/2009 0645    111        Total Cholesterol/HDL:CHD Risk Coronary Heart Disease Risk Table                     Men   Women  1/2 Average Risk   3.4   3.3  Average Risk       5.0   4.4  2 X Average Risk   9.6   7.1  3 X Average Risk  23.4   11.0        Use the calculated Patient Ratio above and the CHD Risk Table to determine the patient's CHD Risk.        ATP III CLASSIFICATION (LDL):  <100     mg/dL   Optimal  478-295  mg/dL   Near or Above                    Optimal  130-159  mg/dL   Borderline  621-308  mg/dL   High  >657     mg/dL   Very High    Physical Exam:    VS:  BP 120/72 (BP Location: Right Arm, Patient Position: Sitting)   Pulse 75   Ht 5\' 6"  (1.676 m)   Wt 221 lb (100.2 kg)   SpO2 96%   BMI 35.67 kg/m     Wt Readings from Last 3 Encounters:  09/02/20 221 lb (100.2 kg)  02/26/20 218 lb (98.9 kg)  08/21/19 214 lb 1.3 oz (97.1 kg)     GEN:  Well nourished, well developed in no acute distress HEENT: Normal NECK: No JVD; No carotid bruits LYMPHATICS: No lymphadenopathy CARDIAC: Irregular irregular, no murmurs, no rubs, no gallops RESPIRATORY:  Clear to auscultation without rales, wheezing or rhonchi  ABDOMEN: Soft, non-tender, non-distended MUSCULOSKELETAL:  No edema; No deformity  SKIN: Warm and dry LOWER EXTREMITIES: no swelling NEUROLOGIC:  Alert and oriented x 3 PSYCHIATRIC:  Normal affect   ASSESSMENT:    1. Dyslipidemia   2. AF (paroxysmal atrial fibrillation) (HCC)   3. Nonrheumatic mitral valve regurgitation   4. Bipolar affective disorder, remission status unspecified (HCC)   5. Permanent atrial fibrillation (HCC)    PLAN:    In order of problems listed above:  1. Dyslipidemia: I will check his fasting lipid profile today.  He is taking pravastatin 20 mg.  Previously he does have history of rhabdomyolysis while on statin therefore  will  check AST ALT as well as fasting lipid profile. 2. Atrial fibrillation rate control permanent, AV node is suppressed with calcium channel blocker which I will continue continue anticoagulation. 3. Bipolar disorder doing well. 4. Overall doing well we will continue present management plan as outlined above   Medication Adjustments/Labs and Tests Ordered: Current medicines are reviewed at length with the patient today.  Concerns regarding medicines are outlined above.  Orders Placed This Encounter  Procedures  . Lipid panel  . Hepatic function panel  . Direct LDL  . EKG 12-Lead   Medication changes: No orders of the defined types were placed in this encounter.   Signed, Georgeanna Lea, MD, [facility] 09/02/2020 2:20 PM    [facility]

## 2020-09-02 NOTE — Progress Notes (Signed)
Kg   

## 2020-09-03 DIAGNOSIS — E291 Testicular hypofunction: Secondary | ICD-10-CM | POA: Diagnosis not present

## 2020-09-03 LAB — HEPATIC FUNCTION PANEL
ALT: 17 IU/L (ref 0–44)
AST: 28 IU/L (ref 0–40)
Albumin: 4.5 g/dL (ref 3.6–4.6)
Alkaline Phosphatase: 75 IU/L (ref 44–121)
Bilirubin Total: 1.4 mg/dL — ABNORMAL HIGH (ref 0.0–1.2)
Bilirubin, Direct: 0.32 mg/dL (ref 0.00–0.40)
Total Protein: 7.4 g/dL (ref 6.0–8.5)

## 2020-09-03 LAB — LIPID PANEL
Chol/HDL Ratio: 3.3 ratio (ref 0.0–5.0)
Cholesterol, Total: 111 mg/dL (ref 100–199)
HDL: 34 mg/dL — ABNORMAL LOW (ref >39)
LDL Chol Calc (NIH): 51 mg/dL (ref 0–99)
Triglycerides: 154 mg/dL — ABNORMAL HIGH (ref 0–149)
VLDL Cholesterol Cal: 26 mg/dL (ref 5–40)

## 2020-09-03 LAB — LDL CHOLESTEROL, DIRECT: LDL Direct: 55 mg/dL (ref 0–99)

## 2020-09-04 ENCOUNTER — Other Ambulatory Visit: Payer: Self-pay | Admitting: Cardiology

## 2020-09-05 ENCOUNTER — Other Ambulatory Visit: Payer: Self-pay | Admitting: Cardiology

## 2020-09-16 DIAGNOSIS — E7800 Pure hypercholesterolemia, unspecified: Secondary | ICD-10-CM | POA: Diagnosis not present

## 2020-09-16 DIAGNOSIS — M810 Age-related osteoporosis without current pathological fracture: Secondary | ICD-10-CM | POA: Diagnosis not present

## 2020-09-16 DIAGNOSIS — F319 Bipolar disorder, unspecified: Secondary | ICD-10-CM | POA: Diagnosis not present

## 2020-09-23 ENCOUNTER — Other Ambulatory Visit: Payer: Self-pay | Admitting: Psychiatry

## 2020-09-23 DIAGNOSIS — F319 Bipolar disorder, unspecified: Secondary | ICD-10-CM

## 2020-09-24 ENCOUNTER — Other Ambulatory Visit: Payer: Self-pay | Admitting: Psychiatry

## 2020-09-24 DIAGNOSIS — F319 Bipolar disorder, unspecified: Secondary | ICD-10-CM

## 2020-09-24 NOTE — Telephone Encounter (Signed)
Call and ask him to get a lithium level at [facility] on [address].  Also needs to RS with me

## 2020-09-24 NOTE — Telephone Encounter (Signed)
review 

## 2020-09-25 NOTE — Telephone Encounter (Signed)
His wife has been informed.Please schedule appt

## 2020-09-25 NOTE — Telephone Encounter (Signed)
LM for wife, [name], to call for appt with Dr. Jennelle Human for Rosanne Ashing

## 2020-10-16 DIAGNOSIS — F319 Bipolar disorder, unspecified: Secondary | ICD-10-CM | POA: Diagnosis not present

## 2020-10-16 DIAGNOSIS — E7800 Pure hypercholesterolemia, unspecified: Secondary | ICD-10-CM | POA: Diagnosis not present

## 2020-10-16 DIAGNOSIS — M810 Age-related osteoporosis without current pathological fracture: Secondary | ICD-10-CM | POA: Diagnosis not present

## 2020-11-05 DIAGNOSIS — E291 Testicular hypofunction: Secondary | ICD-10-CM | POA: Diagnosis not present

## 2020-11-05 DIAGNOSIS — N400 Enlarged prostate without lower urinary tract symptoms: Secondary | ICD-10-CM | POA: Diagnosis not present

## 2020-11-12 DIAGNOSIS — M5136 Other intervertebral disc degeneration, lumbar region: Secondary | ICD-10-CM | POA: Diagnosis not present

## 2020-11-14 DIAGNOSIS — E291 Testicular hypofunction: Secondary | ICD-10-CM | POA: Diagnosis not present

## 2020-11-26 DIAGNOSIS — E7800 Pure hypercholesterolemia, unspecified: Secondary | ICD-10-CM | POA: Diagnosis not present

## 2020-11-26 DIAGNOSIS — M810 Age-related osteoporosis without current pathological fracture: Secondary | ICD-10-CM | POA: Diagnosis not present

## 2020-11-26 DIAGNOSIS — F319 Bipolar disorder, unspecified: Secondary | ICD-10-CM | POA: Diagnosis not present

## 2020-11-29 ENCOUNTER — Telehealth: Payer: Self-pay | Admitting: Cardiology

## 2020-11-29 NOTE — Telephone Encounter (Signed)
Pt c/o medication issue:  1. Name of Medication: ELIQUIS 5 MG TABS tablet  2. How are you currently taking this medication (dosage and times per day)? 1 tablet twice a day  3. Are you having a reaction (difficulty breathing--STAT)? no  4. What is your medication issue? Patient's wife states the patient was at [facility] and was told by his PA his testosterone is too high and he has too much blood. She states they made him an appointment with [facility], but when he got there they said he had to be off his eliquis for 2 days. She states he needs permission to stop the eliquis so he can make another appointment with [facility]. She states they are only open Wednesday-Friday after lunch.

## 2020-12-02 NOTE — Telephone Encounter (Signed)
Called and spoke to patient wife per dpr. Informed her per Dr. Bing Matter it will be ok for patient to hold eliquis for 2 days she understood no further questions.

## 2020-12-13 ENCOUNTER — Other Ambulatory Visit: Payer: Self-pay | Admitting: Psychiatry

## 2020-12-13 DIAGNOSIS — F319 Bipolar disorder, unspecified: Secondary | ICD-10-CM

## 2020-12-20 DIAGNOSIS — M5136 Other intervertebral disc degeneration, lumbar region: Secondary | ICD-10-CM | POA: Diagnosis not present

## 2020-12-20 DIAGNOSIS — M545 Low back pain, unspecified: Secondary | ICD-10-CM | POA: Diagnosis not present

## 2020-12-23 ENCOUNTER — Other Ambulatory Visit: Payer: Self-pay

## 2020-12-23 ENCOUNTER — Ambulatory Visit (INDEPENDENT_AMBULATORY_CARE_PROVIDER_SITE_OTHER): Payer: Medicare Other | Admitting: Psychiatry

## 2020-12-23 ENCOUNTER — Encounter: Payer: Self-pay | Admitting: Psychiatry

## 2020-12-23 DIAGNOSIS — G3184 Mild cognitive impairment, so stated: Secondary | ICD-10-CM | POA: Diagnosis not present

## 2020-12-23 DIAGNOSIS — F319 Bipolar disorder, unspecified: Secondary | ICD-10-CM

## 2020-12-23 MED ORDER — LITHIUM CARBONATE ER 450 MG PO TBCR: EXTENDED_RELEASE_TABLET | ORAL | 2 refills | Status: DC

## 2020-12-23 NOTE — Progress Notes (Signed)
Sean HugerJames R Rann [id number] May 20, 1933 87 y.o.   Subjective:   Patient ID:  Sean Bishop is a 87 y.o. (DOB May 20, 1933) male.  Chief Complaint:  Chief Complaint  Patient presents with   Follow-up   Bipolar I disorder [facility](HCC)    HPI Sean Bishop presents for follow-up of bipolar disorder.  seen August 2020.  He was having balance problems and lithium was reduced from 600 mg daily to 450 mg daily.  He was encouraged to get a lithium level as soon as possible.  08/02/2019 appointment with the following noted and without med changes. Overall doing very well.  Handling lockdown pretty well.  Couldn't go much of anywhere from the facility.  Missed socialization.  [facility]  Not a bad facility.  No complaints with it.   No sig exercise.  Some walking in the building. Got both Covid vaccines.  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 No sig tremor.  Some walking problems.  12/23/2020 appointment with the following noted:  seen with wife Health problems. On lithium 450 mg daily. Has had physical problems.  Using walker.   W thinks he's doing OK.  However she notes that he is memory has declined.  Sometimes he "remembers" things in a way that is different from the way they actually happened.  However he has not had any unusual mood swings. PCP says he cannot drive anymore bc neuropathy and physical problems.  No med swings since reduction in lithium. Patient reports stable mood and denies depressed or irritable moods.  Patient denies any recent difficulty with anxiety.  Patient denies difficulty with sleep initiation or maintenance. Denies appetite disturbance.  Patient reports that energy and motivation have been good.  Patient reports some difficulty with concentration and memory.  Patient denies any suicidal ideation.  Aware of cognitive problems.  . Tolerating meds.  Wife Lanora Manislizabeth.  PCP Camillo FlamingBeth Barnes advised against driving bc history of syncope.  Past Psychiatric Medication  Trials:  Decades of Lithium,  lorazepam  Review of Systems:  Review of Systems  Constitutional:  Negative for fatigue.  Musculoskeletal:  Positive for arthralgias, back pain and gait problem.  Neurological:  Positive for weakness.  Psychiatric/Behavioral:  Positive for decreased concentration. Negative for agitation, behavioral problems, confusion, dysphoric mood, hallucinations, self-injury and suicidal ideas. The patient is not nervous/anxious and is not hyperactive.    Medications: I have reviewed the patient's current medications.  Current Outpatient Medications  Medication Sig Dispense Refill   acetaminophen (TYLENOL) 500 MG tablet Take 500 mg by mouth 2 (two) times daily.      Ascorbic Acid (VITAMIN C PO) Take 1 tablet by mouth daily.     ELIQUIS 5 MG TABS tablet TAKE 1 TABLET BY MOUTH TWICE DAILY. 60 tablet 5   ibandronate (BONIVA) 150 MG tablet Take 150 mg by mouth every 30 (thirty) days.     latanoprost (XALATAN) 0.005 % ophthalmic solution Place 1 drop into both eyes at bedtime.     Multiple Vitamin (MULTIVITAMIN WITH MINERALS) TABS tablet Take 1 tablet by mouth daily.     pravastatin (PRAVACHOL) 20 MG tablet TAKE 1 TABLET IN THE P.M. 90 tablet 1   testosterone cypionate (DEPOTESTOSTERONE CYPIONATE) 200 MG/ML injection Every 3 weeks     timolol (TIMOPTIC) 0.5 % ophthalmic solution      diltiazem (CARDIZEM CD) 120 MG 24 hr capsule TAKE (1) CAPSULE DAILY. 90 capsule 3   lithium carbonate (ESKALITH) 450 MG CR tablet TAKE 1  TABLET IN THE P.M. 90 tablet 2   No current facility-administered medications for this visit.    Medication Side Effects: None  Allergies:  Allergies  Allergen Reactions   Lipitor [Atorvastatin] Other (See Comments)    Joint soreness    Past Medical History:  Diagnosis Date   AF (paroxysmal atrial fibrillation) (HCC) 02/16/2018   Atrial fibrillation (HCC)    Atrial fibrillation with RVR (HCC) 02/16/2018   Bipolar 1 disorder (HCC)    Bipolar disorder  (HCC) 02/17/2018   Dizziness 02/26/2020   DJD (degenerative joint disease)    Dyslipidemia 02/26/2020   Gait instability 02/16/2018   Hypertension    Mitral regurgitation    mild    Polycythemia 02/16/2018   Renal insufficiency    Stroke [facility])    Tremor 02/16/2018   Tremors of nervous system 01/2018    Family History  Problem Relation Age of Onset   Alcohol abuse Mother    Cancer Father     Social History   Socioeconomic History   Marital status: Married    Spouse name: Not on file   Number of children: Not on file   Years of education: Not on file   Highest education level: Not on file  Occupational History   Not on file  Tobacco Use   Smoking status: Former    Pack years: 0.00   Smokeless tobacco: Never  Vaping Use   Vaping Use: Never used  Substance and Sexual Activity   Alcohol use: Yes    Alcohol/week: 1.0 standard drink    Types: 1 Glasses of wine per week    Comment: 1 per month   Drug use: No   Sexual activity: Not on file  Other Topics Concern   Not on file  Social History Narrative   Not on file   Social Determinants of Health   Financial Resource Strain: Not on file  Food Insecurity: Not on file  Transportation Needs: Not on file  Physical Activity: Not on file  Stress: Not on file  Social Connections: Not on file  Intimate Partner Violence: Not on file    Past Medical History, Surgical history, Social history, and Family history were reviewed and updated as appropriate.   Please see review of systems for further details on the patient's review from today.   Objective:   Physical Exam:  There were no vitals taken for this visit.  Physical Exam Neurological:     Mental Status: He is alert and oriented to person, place, and time. Mental status is at baseline.     Cranial Nerves: No dysarthria.  Psychiatric:        Attention and Perception: Attention normal.        Mood and Affect: Mood normal. Mood is not anxious, depressed or elated.         Speech: Speech is delayed. Speech is not slurred.        Behavior: Behavior is cooperative.        Thought Content: Thought content normal. Thought content is not paranoid or delusional. Thought content does not include homicidal or suicidal ideation. Thought content does not include homicidal or suicidal plan.        Cognition and Memory: Cognition is impaired. Memory is impaired. He exhibits impaired recent memory and impaired remote memory.        Judgment: Judgment normal.     Comments: Talkative with poor focus  Somewhat difficult to direct but did ask questions. Word finding problems  Lab Review:     Component Value Date/Time   NA 140 02/17/2018 0129   K 4.5 02/17/2018 0129   CL 110 02/17/2018 0129   CO2 19 (L) 02/17/2018 0129   GLUCOSE 93 02/17/2018 0129   BUN 12 02/17/2018 0129   CREATININE 1.07 02/17/2018 0129   CALCIUM 9.2 02/17/2018 0129   PROT 7.4 09/02/2020 1423   ALBUMIN 4.5 09/02/2020 1423   AST 28 09/02/2020 1423   ALT 17 09/02/2020 1423   ALKPHOS 75 09/02/2020 1423   BILITOT 1.4 (H) 09/02/2020 1423   GFRNONAA >60 02/17/2018 0129   GFRAA >60 02/17/2018 0129       Component Value Date/Time   WBC 8.3 02/17/2018 0129   RBC 5.39 02/17/2018 0129   HGB 16.5 02/17/2018 0129   HCT 52.1 (H) 02/17/2018 0129   PLT 194 02/17/2018 0129   MCV 96.7 02/17/2018 0129   MCH 30.6 02/17/2018 0129   MCHC 31.7 02/17/2018 0129   RDW 13.2 02/17/2018 0129   LYMPHSABS 1.3 02/16/2018 1441   MONOABS 1.0 02/16/2018 1441   EOSABS 0.0 02/16/2018 1441   BASOSABS 0.1 02/16/2018 1441    Lithium Lvl  Date Value Ref Range Status  02/17/2018 0.38 (L) 0.60 - 1.20 mmol/L Final    Comment:    Performed at [facility], [address] [place], [place] [address]     No results found for: Jonathon Jordan, VALPROATE, CBMZ   Labs July 07, 2018 incl Li 0.9,  December 14, 2018 = Cr 1.29, Calcium 10.8, CMP normal  Normal lipids and PTH   .res Assessment: Plan:     Argil was seen today for follow-up and bipolar i disorder (hcc).  Diagnoses and all orders for this visit:  Bipolar I disorder (HCC) -     Lithium level -     lithium carbonate (ESKALITH) 450 MG CR tablet; TAKE 1 TABLET IN THE P.M.  Mild cognitive impairment Supportive therapy dealing with age related problems and health and more recently Covid isolation.  No worsening of his health since his last visit..  Not regained full capacity.  Cannot drive anymore DT aging.  Labs from January, June 2020 suggest may need to reduce the lithium DT aging kidneys. Cr and calcium a bit higher than desired.  Bipolar has been stable for years.  No mood swings nor depression.  No problems with any worsening of mood since the reduction in lithium to the current dose of 450 mg daily.  He is not sure if it helped his balance or not but he is not complaining of any major balance problems at the moment. Counseled patient regarding potential benefits, risks, and side effects of lithium to include potential risk of lithium affecting thyroid and renal function.  Discussed need for periodic lab monitoring to determine drug level and to assess for potential adverse effects.  Counseled patient regarding signs and symptoms of lithium toxicity and advised that they notify office immediately or seek urgent medical attention if experiencing these signs and symptoms.  Patient advised to contact office with any questions or concerns.  Continue lithium to CR 450 1 each evening.   Counseled patient regarding potential benefits, risks, and side effects of lithium to include potential risk of lithium affecting thyroid and renal function.  Discussed need for periodic lab monitoring to determine drug level and to assess for potential adverse effects.  Counseled patient regarding signs and symptoms of lithium toxicity and advised that they notify office immediately or  seek urgent medical attention if experiencing these signs and  symptoms.  Patient advised to contact office with any questions or concerns. Discussed the importance of avoiding lithium toxicity and how with aging the body has more difficulty eliminating lithium.  Therefore lithium dosages may need to be decreased as 1 ages.  Very important that we get a lithium level as soon as possible.  They agree Check lithium level. No med changes today  We need labs as soon as possible including lithium level and BMP and TSH. Apparently may be getting it elsewhere.  Mild cognitive impairment is ongoing and may have progressed to the point of dementia.  He cannot remember things such as when he had his last physical exam.  He does believe he had a physical however within the last year.  He is no longer driving and he is agreeable with this plan.  This appt was 30 mins.  FU 6 mos or earlier PRN.  Meredith Staggers, MD, DFAPA   Consider reducing lithium Please see After Visit Summary for patient specific instructions.  Future Appointments  Date Time Provider Department Center  06/26/2021  1:00 PM Cottle, Steva Ready., MD CP-CP None     Orders Placed This Encounter  Procedures   Lithium level       -------------------------------

## 2020-12-27 DIAGNOSIS — L84 Corns and callosities: Secondary | ICD-10-CM | POA: Diagnosis not present

## 2020-12-27 DIAGNOSIS — B351 Tinea unguium: Secondary | ICD-10-CM | POA: Diagnosis not present

## 2020-12-27 DIAGNOSIS — M79672 Pain in left foot: Secondary | ICD-10-CM | POA: Diagnosis not present

## 2020-12-27 DIAGNOSIS — M79671 Pain in right foot: Secondary | ICD-10-CM | POA: Diagnosis not present

## 2021-01-20 DIAGNOSIS — E7800 Pure hypercholesterolemia, unspecified: Secondary | ICD-10-CM | POA: Diagnosis not present

## 2021-01-20 DIAGNOSIS — F418 Other specified anxiety disorders: Secondary | ICD-10-CM | POA: Diagnosis not present

## 2021-01-20 DIAGNOSIS — Z0000 Encounter for general adult medical examination without abnormal findings: Secondary | ICD-10-CM | POA: Diagnosis not present

## 2021-01-20 DIAGNOSIS — F319 Bipolar disorder, unspecified: Secondary | ICD-10-CM | POA: Diagnosis not present

## 2021-03-04 ENCOUNTER — Other Ambulatory Visit: Payer: Self-pay | Admitting: Cardiology

## 2021-03-14 ENCOUNTER — Other Ambulatory Visit: Payer: Self-pay | Admitting: Cardiology

## 2021-03-18 ENCOUNTER — Other Ambulatory Visit: Payer: Self-pay | Admitting: Cardiology

## 2021-03-18 NOTE — Telephone Encounter (Signed)
Prescription refill request for Eliquis received. Indication:afib Last office visit:krasowski 09/02/20 Scr: 1.170 mg/ 01/20/2021 Age: 72m Weight:100.2kg

## 2021-04-09 DIAGNOSIS — L853 Xerosis cutis: Secondary | ICD-10-CM | POA: Diagnosis not present

## 2021-04-09 DIAGNOSIS — L570 Actinic keratosis: Secondary | ICD-10-CM | POA: Diagnosis not present

## 2021-04-09 DIAGNOSIS — I872 Venous insufficiency (chronic) (peripheral): Secondary | ICD-10-CM | POA: Diagnosis not present

## 2021-04-17 DIAGNOSIS — M810 Age-related osteoporosis without current pathological fracture: Secondary | ICD-10-CM | POA: Diagnosis not present

## 2021-04-17 DIAGNOSIS — E7800 Pure hypercholesterolemia, unspecified: Secondary | ICD-10-CM | POA: Diagnosis not present

## 2021-04-17 DIAGNOSIS — F319 Bipolar disorder, unspecified: Secondary | ICD-10-CM | POA: Diagnosis not present

## 2021-04-30 ENCOUNTER — Other Ambulatory Visit: Payer: Self-pay

## 2021-04-30 ENCOUNTER — Encounter: Payer: Self-pay | Admitting: Cardiology

## 2021-04-30 ENCOUNTER — Ambulatory Visit: Payer: Medicare Other | Admitting: Cardiology

## 2021-04-30 VITALS — BP 108/68 | HR 71 | Ht 66.0 in | Wt 214.0 lb

## 2021-04-30 DIAGNOSIS — F319 Bipolar disorder, unspecified: Secondary | ICD-10-CM | POA: Diagnosis not present

## 2021-04-30 DIAGNOSIS — I10 Essential (primary) hypertension: Secondary | ICD-10-CM

## 2021-04-30 DIAGNOSIS — I420 Dilated cardiomyopathy: Secondary | ICD-10-CM

## 2021-04-30 DIAGNOSIS — I480 Paroxysmal atrial fibrillation: Secondary | ICD-10-CM

## 2021-04-30 DIAGNOSIS — E785 Hyperlipidemia, unspecified: Secondary | ICD-10-CM

## 2021-04-30 DIAGNOSIS — I340 Nonrheumatic mitral (valve) insufficiency: Secondary | ICD-10-CM

## 2021-04-30 NOTE — Progress Notes (Signed)
Cardiology Office Note:    Date:  04/30/2021   ID:  Sean Bishop, DOB 1932-08-28, MRN [id number]  PCP:  Juluis Rainier, MD  Cardiologist:  Gypsy Balsam, MD    Referring MD: Juluis Rainier, MD   Chief Complaint  Patient presents with   Follow-up  Doing fine  History of Present Illness:    Sean Bishop is a 87 y.o. male with past medical history significant for permanent atrial fibrillation, bipolar disorder, essential hypertension, dyslipidemia, cardiomyopathy with ejection fraction of 45%.  He comes today 2 months of follow-up overall he is doing well he described only 1 episode of chest pain that happen maybe months ago.  Apparently he was watching TV when it happened.  Otherwise no palpitations no dizziness no swelling of lower extremities.  Past Medical History:  Diagnosis Date   AF (paroxysmal atrial fibrillation) (HCC) 02/16/2018   Atrial fibrillation (HCC)    Atrial fibrillation with RVR (HCC) 02/16/2018   Bipolar 1 disorder (HCC)    Bipolar disorder (HCC) 02/17/2018   Dizziness 02/26/2020   DJD (degenerative joint disease)    Dyslipidemia 02/26/2020   Gait instability 02/16/2018   Hypertension    Mitral regurgitation    mild    Polycythemia 02/16/2018   Renal insufficiency    Stroke [facility])    Tremor 02/16/2018   Tremors of nervous system 01/2018    Past Surgical History:  Procedure Laterality Date   SHOULDER SURGERY      Current Medications: Current Meds  Medication Sig   acetaminophen (TYLENOL) 500 MG tablet Take 500 mg by mouth 2 (two) times daily.    apixaban (ELIQUIS) 5 MG TABS tablet TAKE 1 TABLET BY MOUTH TWICE DAILY.   Ascorbic Acid (VITAMIN C PO) Take 1 tablet by mouth daily.   diltiazem (CARDIZEM CD) 120 MG 24 hr capsule TAKE (1) CAPSULE DAILY.   ibandronate (BONIVA) 150 MG tablet Take 150 mg by mouth every 30 (thirty) days.   latanoprost (XALATAN) 0.005 % ophthalmic solution Place 1 drop into both eyes at bedtime.   lithium carbonate  (ESKALITH) 450 MG CR tablet TAKE 1 TABLET IN THE P.M.   Multiple Vitamin (MULTIVITAMIN WITH MINERALS) TABS tablet Take 1 tablet by mouth daily.   pravastatin (PRAVACHOL) 20 MG tablet TAKE ONE TABLET BY MOUTH IN THE EVENING   testosterone cypionate (DEPOTESTOSTERONE CYPIONATE) 200 MG/ML injection Every 3 weeks   timolol (TIMOPTIC) 0.5 % ophthalmic solution      Allergies:   Lipitor [atorvastatin]   Social History   Socioeconomic History   Marital status: Married    Spouse name: Not on file   Number of children: Not on file   Years of education: Not on file   Highest education level: Not on file  Occupational History   Not on file  Tobacco Use   Smoking status: Former   Smokeless tobacco: Never  Vaping Use   Vaping Use: Never used  Substance and Sexual Activity   Alcohol use: Yes    Alcohol/week: 1.0 standard drink    Types: 1 Glasses of wine per week    Comment: 1 per month   Drug use: No   Sexual activity: Not on file  Other Topics Concern   Not on file  Social History Narrative   Not on file   Social Determinants of Health   Financial Resource Strain: Not on file  Food Insecurity: Not on file  Transportation Needs: Not on file  Physical Activity: Not on file  Stress: Not on file  Social Connections: Not on file     Family History: The patient's family history includes Alcohol abuse in his mother; Cancer in his father. ROS:   Please see the history of present illness.    All 14 point review of systems negative except as described per history of present illness  EKGs/Labs/Other Studies Reviewed:      Recent Labs: 09/02/2020: ALT 17  Recent Lipid Panel    Component Value Date/Time   CHOL 111 09/02/2020 1423   TRIG 154 (H) 09/02/2020 1423   HDL 34 (L) 09/02/2020 1423   CHOLHDL 3.3 09/02/2020 1423   CHOLHDL 4.8 08/24/2009 0645   VLDL 24 08/24/2009 0645   LDLCALC 51 09/02/2020 1423   LDLDIRECT 55 09/02/2020 1423    Physical Exam:    VS:  BP 108/68    Pulse 71   Ht 5\' 6"  (1.676 m)   Wt 214 lb (97.1 kg)   SpO2 98%   BMI 34.54 kg/m     Wt Readings from Last 3 Encounters:  04/30/21 214 lb (97.1 kg)  09/02/20 221 lb (100.2 kg)  02/26/20 218 lb (98.9 kg)     GEN:  Well nourished, well developed in no acute distress HEENT: Normal NECK: No JVD; No carotid bruits LYMPHATICS: No lymphadenopathy CARDIAC: Irregular irregular, no murmurs, no rubs, no gallops RESPIRATORY:  Clear to auscultation without rales, wheezing or rhonchi  ABDOMEN: Soft, non-tender, non-distended MUSCULOSKELETAL:  No edema; No deformity  SKIN: Warm and dry LOWER EXTREMITIES: no swelling NEUROLOGIC:  Alert and oriented x 3 PSYCHIATRIC:  Normal affect   ASSESSMENT:    1. Dyslipidemia   2. Essential hypertension   3. AF (paroxysmal atrial fibrillation) (HCC)   4. Primary hypertension   5. Bipolar 1 disorder (HCC)   6. Nonrheumatic mitral valve regurgitation    PLAN:    In order of problems listed above:  Cardiomyopathy with ejection fraction in the mid to 40%.  I will ask him to have Chem-7 done if Chem-7 is fine we will start ARB.  In the future we will consider doing Entresto. Essential hypertension: Blood pressure seems to well controlled continue present management. Permanent atrial fibrillation, rate controlled, anticoagulated which I will continue. Nonrheumatic mitral valve regurgitation, not significant enough to consider any intervention.  Dyslipidemia, I did review his K PN which show me LDL of 75 HDL 45 and this is on pravastatin 20 which I will continue.    Medication Adjustments/Labs and Tests Ordered: Current medicines are reviewed at length with the patient today.  Concerns regarding medicines are outlined above.  Orders Placed This Encounter  Procedures   Basic metabolic panel   Medication changes: No orders of the defined types were placed in this encounter.   Signed, 02/28/20, MD, [facility] 04/30/2021 4:45 PM    [facility]

## 2021-04-30 NOTE — Progress Notes (Signed)
Bmp

## 2021-04-30 NOTE — Patient Instructions (Signed)
Medication Instructions:  Your physician recommends that you continue on your current medications as directed. Please refer to the Current Medication list given to you today.  *If you need a refill on your cardiac medications before your next appointment, please call your pharmacy*   Lab Work: Your physician recommends that you return for lab work in: BMP today  If you have labs (blood work) drawn today and your tests are completely normal, you will receive your results only by: MyChart Message (if you have MyChart) OR A paper copy in the mail If you have any lab test that is abnormal or we need to change your treatment, we will call you to review the results.   Testing/Procedures: None    Follow-Up: At [facility], you and your health needs are our priority.  As part of our continuing mission to provide you with exceptional heart care, we have created designated Provider Care Teams.  These Care Teams include your primary Cardiologist (physician) and Advanced Practice Providers (APPs -  Physician Assistants and Nurse Practitioners) who all work together to provide you with the care you need, when you need it.  We recommend signing up for the patient portal called "MyChart".  Sign up information is provided on this After Visit Summary.  MyChart is used to connect with patients for Virtual Visits (Telemedicine).  Patients are able to view lab/test results, encounter notes, upcoming appointments, etc.  Non-urgent messages can be sent to your provider as well.   To learn more about what you can do with MyChart, go to [email or web address].    Your next appointment:   6 month(s)  The format for your next appointment:   In Person  Provider:   Gypsy Balsam, MD   Other Instructions

## 2021-05-01 DIAGNOSIS — Z79899 Other long term (current) drug therapy: Secondary | ICD-10-CM

## 2021-05-01 LAB — BASIC METABOLIC PANEL
BUN/Creatinine Ratio: 15 (ref 10–24)
BUN: 20 mg/dL (ref 8–27)
CO2: 21 mmol/L (ref 20–29)
Calcium: 10.5 mg/dL — ABNORMAL HIGH (ref 8.6–10.2)
Chloride: 104 mmol/L (ref 96–106)
Creatinine, Ser: 1.31 mg/dL — ABNORMAL HIGH (ref 0.76–1.27)
Glucose: 86 mg/dL (ref 70–99)
Potassium: 4.9 mmol/L (ref 3.5–5.2)
Sodium: 140 mmol/L (ref 134–144)
eGFR: 53 mL/min/{1.73_m2} — ABNORMAL LOW (ref >59)

## 2021-05-06 ENCOUNTER — Telehealth: Payer: Self-pay

## 2021-05-06 ENCOUNTER — Telehealth: Payer: Self-pay | Admitting: Cardiology

## 2021-05-06 NOTE — Telephone Encounter (Signed)
Mrs. Krauser is returning Javanna's call.

## 2021-05-06 NOTE — Telephone Encounter (Signed)
-----   Message from Georgeanna Lea, MD sent at 05/06/2021 11:36 AM EST ----- Labs are good.  Continue present management

## 2021-05-06 NOTE — Telephone Encounter (Signed)
Called patient. Spoke with [name] made aware of the lab results. Verbalized understanding. No questions or concerns expressed at this time.

## 2021-05-08 NOTE — Telephone Encounter (Signed)
Return patient call however I spoke with the patient wife and informed her of results(ok per DPR)

## 2021-05-29 DIAGNOSIS — I872 Venous insufficiency (chronic) (peripheral): Secondary | ICD-10-CM | POA: Diagnosis not present

## 2021-05-29 DIAGNOSIS — L570 Actinic keratosis: Secondary | ICD-10-CM | POA: Diagnosis not present

## 2021-05-29 DIAGNOSIS — L853 Xerosis cutis: Secondary | ICD-10-CM | POA: Diagnosis not present

## 2021-05-29 DIAGNOSIS — B354 Tinea corporis: Secondary | ICD-10-CM | POA: Diagnosis not present

## 2021-06-02 ENCOUNTER — Telehealth: Payer: Self-pay | Admitting: Cardiology

## 2021-06-02 NOTE — Telephone Encounter (Signed)
Spoke to patient wife per dpr. Informed her the order is placed now. She will take patient to have drawn.

## 2021-06-02 NOTE — Telephone Encounter (Signed)
Wife of patient called. The wife took the patient to get repeat labs drawn as requested. When the patient and the wife arrived at the lab, the office did not have lab orders so the patient's labs could not be drawn. The wife took the patient home. She is not sure what she needs to do next

## 2021-06-02 NOTE — Telephone Encounter (Signed)
This has been addressed on the phone with the patient wife per dpr.

## 2021-06-02 NOTE — Telephone Encounter (Signed)
Left message for patient to return call.

## 2021-06-16 ENCOUNTER — Other Ambulatory Visit: Payer: Self-pay | Admitting: Cardiology

## 2021-06-16 NOTE — Telephone Encounter (Signed)
Prescription refill request for Eliquis received. Indication:Afib Last office visit:11/22 Scr:1.3 Age: 87 Weight:97.1 kg  Prescription refilled

## 2021-06-18 DIAGNOSIS — Z79899 Other long term (current) drug therapy: Secondary | ICD-10-CM | POA: Diagnosis not present

## 2021-06-19 LAB — BASIC METABOLIC PANEL
BUN/Creatinine Ratio: 13 (ref 10–24)
BUN: 17 mg/dL (ref 8–27)
CO2: 22 mmol/L (ref 20–29)
Calcium: 10.4 mg/dL — ABNORMAL HIGH (ref 8.6–10.2)
Chloride: 101 mmol/L (ref 96–106)
Creatinine, Ser: 1.34 mg/dL — ABNORMAL HIGH (ref 0.76–1.27)
Glucose: 79 mg/dL (ref 70–99)
Potassium: 4.4 mmol/L (ref 3.5–5.2)
Sodium: 137 mmol/L (ref 134–144)
eGFR: 52 mL/min/{1.73_m2} — ABNORMAL LOW (ref >59)

## 2021-06-26 ENCOUNTER — Encounter: Payer: Self-pay | Admitting: Psychiatry

## 2021-06-26 ENCOUNTER — Ambulatory Visit (INDEPENDENT_AMBULATORY_CARE_PROVIDER_SITE_OTHER): Payer: Medicare Other | Admitting: Psychiatry

## 2021-06-26 ENCOUNTER — Other Ambulatory Visit: Payer: Self-pay

## 2021-06-26 DIAGNOSIS — G3184 Mild cognitive impairment, so stated: Secondary | ICD-10-CM

## 2021-06-26 DIAGNOSIS — F319 Bipolar disorder, unspecified: Secondary | ICD-10-CM | POA: Diagnosis not present

## 2021-06-26 NOTE — Progress Notes (Signed)
Sean Bishop [id number] 01/02/1933 87 y.o.   Subjective:   Patient ID:  Sean Bishop is a 87 y.o. (DOB [birth year]) male.  Chief Complaint:  Chief Complaint  Patient presents with   Follow-up    Bipolar I disorder [facility])    HPI Sean Bishop presents for follow-up of bipolar disorder.  seen August 2020.  He was having balance problems and lithium was reduced from 600 mg daily to 450 mg daily.  He was encouraged to get a lithium level as soon as possible.  08/02/2019 appointment with the following noted and without med changes. Overall doing very well.  Handling lockdown pretty well.  Couldn't go much of anywhere from the facility.  Missed socialization.  They are staying in [facility].  Not a bad facility.  No complaints with it.   No sig exercise.  Some walking in the building. Got both Covid vaccines.  Son Engineer, civil (consulting) in [place]. No sig tremor.  Some walking problems.  12/23/2020 appointment with the following noted:  seen with wife Health problems. On lithium 450 mg daily. Has had physical problems.  Using walker.   W thinks he's doing OK.  However she notes that he is memory has declined.  Sometimes he "remembers" things in a way that is different from the way they actually happened.  However he has not had any unusual mood swings. PCP says he cannot drive anymore bc neuropathy and physical problems. Plan: get lithium level  06/26/21 appt noted: Had trouble getting the lithium level.   Hip pain and needs walker.   No SE noted with lithium.  No med swings since reduction in lithium. Patient reports stable mood and denies depressed or irritable moods.  Patient denies any recent difficulty with anxiety.  Patient denies difficulty with sleep initiation or maintenance. Denies appetite disturbance.  Patient reports that energy and motivation have been good.  Patient reports some difficulty with concentration and memory.  Patient denies any suicidal ideation.  Aware of cognitive  problems.  . Tolerating meds.  Wife Sean Bishop. Adopted kids [name] nurse;  [name] was murdered.  PCP [name] advised against driving bc history of syncope.  Past Psychiatric Medication Trials:  Decades of Lithium,  lorazepam  Review of Systems:  Review of Systems  Constitutional:  Negative for fatigue.  Musculoskeletal:  Positive for arthralgias, back pain and gait problem.  Neurological:  Positive for weakness.  Psychiatric/Behavioral:  Positive for decreased concentration. Negative for agitation, behavioral problems, confusion, dysphoric mood, hallucinations, self-injury and suicidal ideas. The patient is not nervous/anxious and is not hyperactive.    Medications: I have reviewed the patient's current medications.  Current Outpatient Medications  Medication Sig Dispense Refill   acetaminophen (TYLENOL) 500 MG tablet Take 500 mg by mouth 2 (two) times daily.      Ascorbic Acid (VITAMIN C PO) Take 1 tablet by mouth daily.     ELIQUIS 5 MG TABS tablet TAKE 1 TABLET BY MOUTH TWICE DAILY. 180 tablet 1   ibandronate (BONIVA) 150 MG tablet Take 150 mg by mouth every 30 (thirty) days.     latanoprost (XALATAN) 0.005 % ophthalmic solution Place 1 drop into both eyes at bedtime.     lithium carbonate (ESKALITH) 450 MG CR tablet TAKE 1 TABLET IN THE P.M. 90 tablet 2   Multiple Vitamin (MULTIVITAMIN WITH MINERALS) TABS tablet Take 1 tablet by mouth daily.     pravastatin (PRAVACHOL) 20 MG tablet TAKE ONE TABLET BY MOUTH IN THE  EVENING 90 tablet 1   testosterone cypionate (DEPOTESTOSTERONE CYPIONATE) 200 MG/ML injection Every 3 weeks     timolol (TIMOPTIC) 0.5 % ophthalmic solution      diltiazem (CARDIZEM CD) 120 MG 24 hr capsule TAKE (1) CAPSULE DAILY. 90 capsule 3   No current facility-administered medications for this visit.    Medication Side Effects: None  Allergies:  Allergies  Allergen Reactions   Lipitor [Atorvastatin] Other (See Comments)    Joint soreness    Past  Medical History:  Diagnosis Date   AF (paroxysmal atrial fibrillation) (HCC) 02/16/2018   Atrial fibrillation (HCC)    Atrial fibrillation with RVR (HCC) 02/16/2018   Bipolar 1 disorder (HCC)    Bipolar disorder (HCC) 02/17/2018   Dizziness 02/26/2020   DJD (degenerative joint disease)    Dyslipidemia 02/26/2020   Gait instability 02/16/2018   Hypertension    Mitral regurgitation    mild    Polycythemia 02/16/2018   Renal insufficiency    Stroke [facility])    Tremor 02/16/2018   Tremors of nervous system 01/2018    Family History  Problem Relation Age of Onset   Alcohol abuse Mother    Cancer Father     Social History   Socioeconomic History   Marital status: Married    Spouse name: Not on file   Number of children: Not on file   Years of education: Not on file   Highest education level: Not on file  Occupational History   Not on file  Tobacco Use   Smoking status: Former   Smokeless tobacco: Never  Vaping Use   Vaping Use: Never used  Substance and Sexual Activity   Alcohol use: Yes    Alcohol/week: 1.0 standard drink    Types: 1 Glasses of wine per week    Comment: 1 per month   Drug use: No   Sexual activity: Not on file  Other Topics Concern   Not on file  Social History Narrative   Not on file   Social Determinants of Health   Financial Resource Strain: Not on file  Food Insecurity: Not on file  Transportation Needs: Not on file  Physical Activity: Not on file  Stress: Not on file  Social Connections: Not on file  Intimate Partner Violence: Not on file    Past Medical History, Surgical history, Social history, and Family history were reviewed and updated as appropriate.   Please see review of systems for further details on the patient's review from today.   Objective:   Physical Exam:  There were no vitals taken for this visit.  Physical Exam Neurological:     Mental Status: He is alert and oriented to person, place, and time. Mental status is at  baseline.     Cranial Nerves: No dysarthria.  Psychiatric:        Attention and Perception: Attention normal.        Mood and Affect: Mood normal. Mood is not anxious, depressed or elated.        Speech: Speech is delayed. Speech is not slurred.        Behavior: Behavior is cooperative.        Thought Content: Thought content normal. Thought content is not paranoid or delusional. Thought content does not include homicidal or suicidal ideation. Thought content does not include suicidal plan.        Cognition and Memory: Cognition is impaired. Memory is impaired. He exhibits impaired recent memory and impaired remote memory.  Judgment: Judgment normal.     Comments: Talkative with poor focus  Somewhat difficult to direct but did ask questions. Word finding problems no worse.     Lab Review:     Component Value Date/Time   NA 137 06/18/2021 1040   K 4.4 06/18/2021 1040   CL 101 06/18/2021 1040   CO2 22 06/18/2021 1040   GLUCOSE 79 06/18/2021 1040   GLUCOSE 93 02/17/2018 0129   BUN 17 06/18/2021 1040   CREATININE 1.34 (H) 06/18/2021 1040   CALCIUM 10.4 (H) 06/18/2021 1040   PROT 7.4 09/02/2020 1423   ALBUMIN 4.5 09/02/2020 1423   AST 28 09/02/2020 1423   ALT 17 09/02/2020 1423   ALKPHOS 75 09/02/2020 1423   BILITOT 1.4 (H) 09/02/2020 1423   GFRNONAA >60 02/17/2018 0129   GFRAA >60 02/17/2018 0129       Component Value Date/Time   WBC 8.3 02/17/2018 0129   RBC 5.39 02/17/2018 0129   HGB 16.5 02/17/2018 0129   HCT 52.1 (H) 02/17/2018 0129   PLT 194 02/17/2018 0129   MCV 96.7 02/17/2018 0129   MCH 30.6 02/17/2018 0129   MCHC 31.7 02/17/2018 0129   RDW 13.2 02/17/2018 0129   LYMPHSABS 1.3 02/16/2018 1441   MONOABS 1.0 02/16/2018 1441   EOSABS 0.0 02/16/2018 1441   BASOSABS 0.1 02/16/2018 1441    Lithium Lvl  Date Value Ref Range Status  02/17/2018 0.38 (L) 0.60 - 1.20 mmol/L Final    Comment:    Performed at [facility], [address],  [place], [place] [address]     No results found for: Jonathon Jordan, VALPROATE, CBMZ   Labs July 07, 2018 incl Li 0.9,  December 14, 2018 = Cr 1.29, Calcium 10.8, CMP normal  Normal lipids and PTH   .res Assessment: Plan:    Sean Bishop was seen today for follow-up.  Diagnoses and all orders for this visit:  Bipolar I disorder (HCC) -     Lithium level  Mild cognitive impairment  Supportive therapy dealing with age related problems and health and more recently Covid isolation.  No worsening of his health since his last visit..  Not regained full capacity.  Cannot drive anymore DT aging.  Labs from June 18, 2021 showed creatinine 1.34 and calcium 10.4.  There was no lithium level evident.  Bipolar has been stable for years.  No mood swings nor depression.  No problems with any worsening of mood since the reduction in lithium to the current dose of 450 mg daily.  He is not sure if it helped his balance or not but he is not complaining of any major balance problems at the moment. Counseled patient regarding potential benefits, risks, and side effects of lithium to include potential risk of lithium affecting thyroid and renal function.  Discussed need for periodic lab monitoring to determine drug level and to assess for potential adverse effects.  Counseled patient regarding signs and symptoms of lithium toxicity and advised that they notify office immediately or seek urgent medical attention if experiencing these signs and symptoms.  Patient advised to contact office with any questions or concerns.  Continue lithium to CR 450 1 each evening.   Counseled patient regarding potential benefits, risks, and side effects of lithium to include potential risk of lithium affecting thyroid and renal function.  Discussed need for periodic lab monitoring to determine drug level and to assess for potential adverse effects.  Counseled patient regarding signs and symptoms of  lithium toxicity and advised that  they notify office immediately or seek urgent medical attention if experiencing these signs and symptoms.  Patient advised to contact office with any questions or concerns. Discussed the importance of avoiding lithium toxicity and how with aging the body has more difficulty eliminating lithium.  Therefore lithium dosages may need to be decreased as 1 ages.  Very important that we get a lithium level as soon as possible.  They agree Check lithium level.  It was requested in June but never received. No med changes today  Mild cognitive impairment is ongoing and may have progressed to the point of dementia.  He cannot remember things such as when he had his last physical exam.  He does believe he had a physical however within the last year.  He is no longer driving and he is agreeable with this plan.  This appt was 30 mins.  FU 6 mos or earlier PRN.  Meredith Staggers, MD, DFAPA   Consider reducing lithium Please see After Visit Summary for patient specific instructions.  Future Appointments  Date Time Provider Department Center  11/18/2021  2:00 PM Georgeanna Lea, MD [facility] None     Orders Placed This Encounter  Procedures   Lithium level       -------------------------------

## 2021-07-03 DIAGNOSIS — L570 Actinic keratosis: Secondary | ICD-10-CM | POA: Diagnosis not present

## 2021-07-03 DIAGNOSIS — B354 Tinea corporis: Secondary | ICD-10-CM | POA: Diagnosis not present

## 2021-07-03 DIAGNOSIS — L853 Xerosis cutis: Secondary | ICD-10-CM | POA: Diagnosis not present

## 2021-07-03 DIAGNOSIS — L578 Other skin changes due to chronic exposure to nonionizing radiation: Secondary | ICD-10-CM | POA: Diagnosis not present

## 2021-07-17 DIAGNOSIS — F319 Bipolar disorder, unspecified: Secondary | ICD-10-CM | POA: Diagnosis not present

## 2021-07-31 DIAGNOSIS — I872 Venous insufficiency (chronic) (peripheral): Secondary | ICD-10-CM | POA: Diagnosis not present

## 2021-07-31 DIAGNOSIS — L570 Actinic keratosis: Secondary | ICD-10-CM | POA: Diagnosis not present

## 2021-07-31 DIAGNOSIS — L578 Other skin changes due to chronic exposure to nonionizing radiation: Secondary | ICD-10-CM | POA: Diagnosis not present

## 2021-07-31 DIAGNOSIS — L853 Xerosis cutis: Secondary | ICD-10-CM | POA: Diagnosis not present

## 2021-08-27 DIAGNOSIS — H5203 Hypermetropia, bilateral: Secondary | ICD-10-CM | POA: Diagnosis not present

## 2021-08-28 DIAGNOSIS — L579 Skin changes due to chronic exposure to nonionizing radiation, unspecified: Secondary | ICD-10-CM | POA: Diagnosis not present

## 2021-08-28 DIAGNOSIS — L218 Other seborrheic dermatitis: Secondary | ICD-10-CM | POA: Diagnosis not present

## 2021-08-28 DIAGNOSIS — I872 Venous insufficiency (chronic) (peripheral): Secondary | ICD-10-CM | POA: Diagnosis not present

## 2021-08-28 DIAGNOSIS — L570 Actinic keratosis: Secondary | ICD-10-CM | POA: Diagnosis not present

## 2021-08-29 ENCOUNTER — Other Ambulatory Visit: Payer: Self-pay | Admitting: Cardiology

## 2021-09-19 ENCOUNTER — Other Ambulatory Visit: Payer: Self-pay | Admitting: Psychiatry

## 2021-09-19 DIAGNOSIS — F319 Bipolar disorder, unspecified: Secondary | ICD-10-CM

## 2021-10-02 DIAGNOSIS — L218 Other seborrheic dermatitis: Secondary | ICD-10-CM | POA: Diagnosis not present

## 2021-10-02 DIAGNOSIS — L570 Actinic keratosis: Secondary | ICD-10-CM | POA: Diagnosis not present

## 2021-10-02 DIAGNOSIS — I872 Venous insufficiency (chronic) (peripheral): Secondary | ICD-10-CM | POA: Diagnosis not present

## 2021-11-12 DIAGNOSIS — N400 Enlarged prostate without lower urinary tract symptoms: Secondary | ICD-10-CM | POA: Diagnosis not present

## 2021-11-12 DIAGNOSIS — M810 Age-related osteoporosis without current pathological fracture: Secondary | ICD-10-CM | POA: Diagnosis not present

## 2021-11-12 DIAGNOSIS — E291 Testicular hypofunction: Secondary | ICD-10-CM | POA: Diagnosis not present

## 2021-11-18 ENCOUNTER — Ambulatory Visit: Payer: Medicare Other | Admitting: Cardiology

## 2021-11-18 ENCOUNTER — Encounter: Payer: Self-pay | Admitting: Cardiology

## 2021-11-18 VITALS — BP 134/84 | HR 61 | Ht 66.0 in | Wt 226.0 lb

## 2021-11-18 DIAGNOSIS — F319 Bipolar disorder, unspecified: Secondary | ICD-10-CM

## 2021-11-18 DIAGNOSIS — I340 Nonrheumatic mitral (valve) insufficiency: Secondary | ICD-10-CM

## 2021-11-18 DIAGNOSIS — R0609 Other forms of dyspnea: Secondary | ICD-10-CM

## 2021-11-18 DIAGNOSIS — E785 Hyperlipidemia, unspecified: Secondary | ICD-10-CM

## 2021-11-18 DIAGNOSIS — I480 Paroxysmal atrial fibrillation: Secondary | ICD-10-CM

## 2021-11-18 DIAGNOSIS — I10 Essential (primary) hypertension: Secondary | ICD-10-CM

## 2021-11-18 NOTE — Patient Instructions (Addendum)

## 2021-11-18 NOTE — Progress Notes (Signed)
Cardiology Office Note:    Date:  11/18/2021   ID:  [name], DOB [birth year], MRN [id number]  PCP:  Juluis Rainier, MD (Inactive)  Cardiologist:  Gypsy Balsam, MD    Referring MD: Juluis Rainier, MD   Chief Complaint  Patient presents with   Follow-up    History of Present Illness:    Sean Bishop is a 88 y.o. male with past medical history significant for permanent atrial fibrillation, bipolar disorder, essential hypertension, dyslipidemia, cardiomyopathy with ejection fraction Nebido 45%.  He is in my office today for follow-up.  Overall he is doing well but he did notice few weeks ago swelling of lower extremities apparently he did see some physician he was given some cream which seems to be helping amazingly well his ability to exercise is limited because of fatigue tiredness as well as shortness of breath.  He denies having a chest pain tightness squeezing pressure burning chest.  He comes to me with a walker.  Past Medical History:  Diagnosis Date   AF (paroxysmal atrial fibrillation) (HCC) 02/16/2018   Atrial fibrillation (HCC)    Atrial fibrillation with RVR (HCC) 02/16/2018   Bipolar 1 disorder (HCC)    Bipolar disorder (HCC) 02/17/2018   Dizziness 02/26/2020   DJD (degenerative joint disease)    Dyslipidemia 02/26/2020   Gait instability 02/16/2018   Hypertension    Mitral regurgitation    mild    Polycythemia 02/16/2018   Renal insufficiency    Stroke [facility])    Tremor 02/16/2018   Tremors of nervous system 01/2018    Past Surgical History:  Procedure Laterality Date   SHOULDER SURGERY      Current Medications: Current Meds  Medication Sig   acetaminophen (TYLENOL) 500 MG tablet Take 500 mg by mouth 2 (two) times daily.    Ascorbic Acid (VITAMIN C PO) Take 1 tablet by mouth daily.   diltiazem (CARDIZEM CD) 120 MG 24 hr capsule TAKE (1) CAPSULE DAILY. (Patient taking differently: Take 120 mg by mouth daily.)   ELIQUIS 5 MG TABS tablet TAKE 1 TABLET  BY MOUTH TWICE DAILY. (Patient taking differently: Take 5 mg by mouth 2 (two) times daily.)   ibandronate (BONIVA) 150 MG tablet Take 150 mg by mouth every 30 (thirty) days.   latanoprost (XALATAN) 0.005 % ophthalmic solution Place 1 drop into both eyes at bedtime.   lithium carbonate (ESKALITH) 450 MG CR tablet TAKE ONE TABLET IN THE EVENING (Patient taking differently: Take 450 mg by mouth at bedtime. TAKE ONE TABLET IN THE EVENING)   Multiple Vitamin (MULTIVITAMIN WITH MINERALS) TABS tablet Take 1 tablet by mouth daily.   pravastatin (PRAVACHOL) 20 MG tablet Take 1 tablet (20 mg total) by mouth every evening.   testosterone cypionate (DEPOTESTOSTERONE CYPIONATE) 200 MG/ML injection Inject 200 mg into the muscle every 21 ( twenty-one) days. Every 3 weeks   timolol (TIMOPTIC) 0.5 % ophthalmic solution Place 1 drop into both eyes daily.     Allergies:   Lipitor [atorvastatin]   Social History   Socioeconomic History   Marital status: Married    Spouse name: Not on file   Number of children: Not on file   Years of education: Not on file   Highest education level: Not on file  Occupational History   Not on file  Tobacco Use   Smoking status: Former   Smokeless tobacco: Never  Vaping Use   Vaping Use: Never used  Substance and Sexual Activity  Alcohol use: Yes    Alcohol/week: 1.0 standard drink    Types: 1 Glasses of wine per week    Comment: 1 per month   Drug use: No   Sexual activity: Not on file  Other Topics Concern   Not on file  Social History Narrative   Not on file   Social Determinants of Health   Financial Resource Strain: Not on file  Food Insecurity: Not on file  Transportation Needs: Not on file  Physical Activity: Not on file  Stress: Not on file  Social Connections: Not on file     Family History: The patient's family history includes Alcohol abuse in his mother; Cancer in his father. ROS:   Please see the history of present illness.    All 14 point  review of systems negative except as described per history of present illness  EKGs/Labs/Other Studies Reviewed:      Recent Labs: 06/18/2021: BUN 17; Creatinine, Ser 1.34; Potassium 4.4; Sodium 137  Recent Lipid Panel    Component Value Date/Time   CHOL 111 09/02/2020 1423   TRIG 154 (H) 09/02/2020 1423   HDL 34 (L) 09/02/2020 1423   CHOLHDL 3.3 09/02/2020 1423   CHOLHDL 4.8 08/24/2009 0645   VLDL 24 08/24/2009 0645   LDLCALC 51 09/02/2020 1423   LDLDIRECT 55 09/02/2020 1423    Physical Exam:    VS:  BP 134/84 (BP Location: Left Arm, Patient Position: Sitting)   Pulse 61   Ht 5\' 6"  (1.676 m)   Wt 226 lb (102.5 kg)   SpO2 94%   BMI 36.48 kg/m     Wt Readings from Last 3 Encounters:  11/18/21 226 lb (102.5 kg)  04/30/21 214 lb (97.1 kg)  09/02/20 221 lb (100.2 kg)     GEN:  Well nourished, well developed in no acute distress HEENT: Normal NECK: No JVD; No carotid bruits LYMPHATICS: No lymphadenopathy CARDIAC: RRR, no murmurs, no rubs, no gallops RESPIRATORY:  Clear to auscultation without rales, wheezing or rhonchi  ABDOMEN: Soft, non-tender, non-distended MUSCULOSKELETAL:  No edema; No deformity  SKIN: Warm and dry LOWER EXTREMITIES: no swelling NEUROLOGIC:  Alert and oriented x 3 PSYCHIATRIC:  Normal affect   ASSESSMENT:    1. AF (paroxysmal atrial fibrillation) (HCC)   2. Primary hypertension   3. Nonrheumatic mitral valve regurgitation   4. Dyslipidemia   5. Bipolar 1 disorder (HCC)    PLAN:    In order of problems listed above:  Atrial fibrillation.  She seems to be somewhat regular today we will get EKG.  We will continue present management he is anticoagulated Essential hypertension blood pressure well controlled continue present management. Swelling of lower extremities now improved significantly.  I will ask him to have an echocardiogram done to proceed reassess left ventricle ejection fraction Dyslipidemia I did review his K PN which show me  LDL of 75 HDL 45 this is from last year summer we will continue present management which include pravastatin   Medication Adjustments/Labs and Tests Ordered: Current medicines are reviewed at length with the patient today.  Concerns regarding medicines are outlined above.  No orders of the defined types were placed in this encounter.  Medication changes: No orders of the defined types were placed in this encounter.   Signed, 11/02/20, MD, [facility] 11/18/2021 2:35 PM    [facility]

## 2021-12-02 ENCOUNTER — Ambulatory Visit (HOSPITAL_BASED_OUTPATIENT_CLINIC_OR_DEPARTMENT_OTHER)
Admission: RE | Admit: 2021-12-02 | Discharge: 2021-12-02 | Disposition: A | Discharge status: Home / Self Care | Payer: Medicare Other | Source: Ambulatory Visit | Attending: Cardiology | Admitting: Cardiology

## 2021-12-02 DIAGNOSIS — I340 Nonrheumatic mitral (valve) insufficiency: Secondary | ICD-10-CM | POA: Diagnosis not present

## 2021-12-02 DIAGNOSIS — I480 Paroxysmal atrial fibrillation: Secondary | ICD-10-CM | POA: Diagnosis not present

## 2021-12-02 DIAGNOSIS — R0609 Other forms of dyspnea: Secondary | ICD-10-CM | POA: Insufficient documentation

## 2021-12-02 LAB — ECHOCARDIOGRAM COMPLETE
MV M vel: 5.73 m/s
MV Peak grad: 131.3 mmHg
S' Lateral: 3 cm

## 2021-12-02 MED ORDER — PERFLUTREN LIPID MICROSPHERE
1.0000 mL | INTRAVENOUS | Status: AC | PRN
  Administered 2021-12-02: 2 mL via INTRAVENOUS

## 2021-12-02 NOTE — Progress Notes (Signed)
  Echocardiogram 2D Echocardiogram with contrast has been performed.  Sean Bishop F 12/02/2021, 11:54 AM

## 2021-12-18 ENCOUNTER — Telehealth: Payer: Self-pay | Admitting: Cardiology

## 2021-12-18 NOTE — Telephone Encounter (Signed)
Patient spouse (per DPR) informed of results

## 2021-12-18 NOTE — Telephone Encounter (Deleted)
Spoke with pt about message. According to pt he was awaiting his results of his BW and Echo before starting Multaq. He wanted to know if he should come for his follow up appt to discuss in Aug.

## 2021-12-18 NOTE — Telephone Encounter (Signed)
Spouse returning nurses call from yesterday. Please advise

## 2021-12-24 ENCOUNTER — Encounter: Payer: Self-pay | Admitting: Psychiatry

## 2021-12-24 ENCOUNTER — Ambulatory Visit (INDEPENDENT_AMBULATORY_CARE_PROVIDER_SITE_OTHER): Payer: Medicare Other | Admitting: Psychiatry

## 2021-12-24 DIAGNOSIS — G3184 Mild cognitive impairment, so stated: Secondary | ICD-10-CM | POA: Diagnosis not present

## 2021-12-24 DIAGNOSIS — F319 Bipolar disorder, unspecified: Secondary | ICD-10-CM | POA: Diagnosis not present

## 2021-12-24 NOTE — Progress Notes (Signed)
Sean Bishop [id number] 1933-03-18 88 y.o.   Subjective:   Patient ID:  Sean Bishop is a 88 y.o. (DOB 1932/10/07) male.  Chief Complaint:  Chief Complaint  Patient presents with   Follow-up    Bipolar I disorder [facility])    HPI [name] presents for follow-up of bipolar disorder.  seen August 2020.  He was having balance problems and lithium was reduced from 600 mg daily to 450 mg daily.  He was encouraged to get a lithium level as soon as possible.  08/02/2019 appointment with the following noted and without med changes. Overall doing very well.  Handling lockdown pretty well.  Couldn't go much of anywhere from the facility.  Missed socialization.  They are staying in [facility].  Not a bad facility.  No complaints with it.   No sig exercise.  Some walking in the building. Got both Covid vaccines.  Son Engineer, civil (consulting) in [place]. No sig tremor.  Some walking problems.  12/23/2020 appointment with the following noted:  seen with wife Health problems. On lithium 450 mg daily. Has had physical problems.  Using walker.   W thinks he's doing OK.  However she notes that he is memory has declined.  Sometimes he "remembers" things in a way that is different from the way they actually happened.  However he has not had any unusual mood swings. PCP says he cannot drive anymore bc neuropathy and physical problems. Plan: get lithium level  06/26/21 appt noted: Had trouble getting the lithium level.   Hip pain and needs walker.   No SE noted with lithium. Plan: no changes  12/24/21 appt noted: [facility].  [name] doing well. Doing well.  No unusual problems. Lithium 450 mg HS Not driving. No med swings since reduction in lithium. Patient reports stable mood and denies depressed or irritable moods.  Patient denies any recent difficulty with anxiety.  Patient denies difficulty with sleep initiation or maintenance. Denies appetite disturbance.  Patient reports that energy  and motivation have been good.  Patient reports some difficulty with concentration and memory.  Patient denies any suicidal ideation.  Aware of cognitive problems.  . Tolerating meds.  Wife [name]. Adopted kids [name] nurse doing well;  [name] was murdered or suicide.  PCP [name] advised against driving bc history of syncope.  Past Psychiatric Medication Trials:  Decades of Lithium,  Lorazepam  Review of Systems:  Review of Systems  Constitutional:  Negative for fatigue.  Musculoskeletal:  Positive for arthralgias, back pain and gait problem.  Neurological:  Positive for weakness. Negative for syncope.  Psychiatric/Behavioral:  Positive for decreased concentration. Negative for agitation, behavioral problems, confusion, dysphoric mood, hallucinations, self-injury and suicidal ideas. The patient is not nervous/anxious and is not hyperactive.     Medications: I have reviewed the patient's current medications.  Current Outpatient Medications  Medication Sig Dispense Refill   acetaminophen (TYLENOL) 500 MG tablet Take 500 mg by mouth 2 (two) times daily.      Ascorbic Acid (VITAMIN C PO) Take 1 tablet by mouth daily.     ELIQUIS 5 MG TABS tablet TAKE 1 TABLET BY MOUTH TWICE DAILY. (Patient taking differently: Take 5 mg by mouth 2 (two) times daily.) 180 tablet 1   ibandronate (BONIVA) 150 MG tablet Take 150 mg by mouth every 30 (thirty) days.     latanoprost (XALATAN) 0.005 % ophthalmic solution Place 1 drop into both eyes at bedtime.     lithium carbonate (  ESKALITH) 450 MG CR tablet TAKE ONE TABLET IN THE EVENING (Patient taking differently: Take 450 mg by mouth at bedtime. TAKE ONE TABLET IN THE EVENING) 90 tablet 2   Multiple Vitamin (MULTIVITAMIN WITH MINERALS) TABS tablet Take 1 tablet by mouth daily.     pravastatin (PRAVACHOL) 20 MG tablet Take 1 tablet (20 mg total) by mouth every evening. 90 tablet 1   testosterone cypionate (DEPOTESTOSTERONE CYPIONATE) 200 MG/ML injection  Inject 200 mg into the muscle every 21 ( twenty-one) days. Every 3 weeks     timolol (TIMOPTIC) 0.5 % ophthalmic solution Place 1 drop into both eyes daily.     diltiazem (CARDIZEM CD) 120 MG 24 hr capsule TAKE (1) CAPSULE DAILY. (Patient taking differently: Take 120 mg by mouth daily.) 90 capsule 3   No current facility-administered medications for this visit.    Medication Side Effects: None  Allergies:  Allergies  Allergen Reactions   Lipitor [Atorvastatin] Other (See Comments)    Joint soreness    Past Medical History:  Diagnosis Date   AF (paroxysmal atrial fibrillation) (HCC) 02/16/2018   Atrial fibrillation (HCC)    Atrial fibrillation with RVR (HCC) 02/16/2018   Bipolar 1 disorder (HCC)    Bipolar disorder (HCC) 02/17/2018   Dizziness 02/26/2020   DJD (degenerative joint disease)    Dyslipidemia 02/26/2020   Gait instability 02/16/2018   Hypertension    Mitral regurgitation    mild    Polycythemia 02/16/2018   Renal insufficiency    Stroke [facility])    Tremor 02/16/2018   Tremors of nervous system 01/2018    Family History  Problem Relation Age of Onset   Alcohol abuse Mother    Cancer Father     Social History   Socioeconomic History   Marital status: Married    Spouse name: Not on file   Number of children: Not on file   Years of education: Not on file   Highest education level: Not on file  Occupational History   Not on file  Tobacco Use   Smoking status: Former   Smokeless tobacco: Never  Vaping Use   Vaping Use: Never used  Substance and Sexual Activity   Alcohol use: Yes    Alcohol/week: 1.0 standard drink of alcohol    Types: 1 Glasses of wine per week    Comment: 1 per month   Drug use: No   Sexual activity: Not on file  Other Topics Concern   Not on file  Social History Narrative   Not on file   Social Determinants of Health   Financial Resource Strain: Not on file  Food Insecurity: Not on file  Transportation Needs: Not on file   Physical Activity: Not on file  Stress: Not on file  Social Connections: Not on file  Intimate Partner Violence: Not on file    Past Medical History, Surgical history, Social history, and Family history were reviewed and updated as appropriate.   Please see review of systems for further details on the patient's review from today.   Objective:   Physical Exam:  There were no vitals taken for this visit.  Physical Exam Neurological:     Mental Status: He is alert and oriented to person, place, and time. Mental status is at baseline.     Cranial Nerves: No dysarthria.     Motor: No tremor.  Psychiatric:        Attention and Perception: Attention normal.  Mood and Affect: Mood normal. Mood is not anxious, depressed or elated.        Speech: Speech is not delayed or slurred.        Behavior: Behavior is cooperative.        Thought Content: Thought content normal. Thought content is not paranoid or delusional. Thought content does not include homicidal or suicidal ideation. Thought content does not include suicidal plan.        Cognition and Memory: Cognition is impaired. Memory is impaired. He exhibits impaired recent memory and impaired remote memory.        Judgment: Judgment normal.     Comments: Talkative with fair focus  Mood good.  No mania. Somewhat difficult to direct but did ask questions. Word finding problems no worse.      Lab Review:     Component Value Date/Time   NA 137 06/18/2021 1040   K 4.4 06/18/2021 1040   CL 101 06/18/2021 1040   CO2 22 06/18/2021 1040   GLUCOSE 79 06/18/2021 1040   GLUCOSE 93 02/17/2018 0129   BUN 17 06/18/2021 1040   CREATININE 1.34 (H) 06/18/2021 1040   CALCIUM 10.4 (H) 06/18/2021 1040   PROT 7.4 09/02/2020 1423   ALBUMIN 4.5 09/02/2020 1423   AST 28 09/02/2020 1423   ALT 17 09/02/2020 1423   ALKPHOS 75 09/02/2020 1423   BILITOT 1.4 (H) 09/02/2020 1423   GFRNONAA >60 02/17/2018 0129   GFRAA >60 02/17/2018 0129        Component Value Date/Time   WBC 8.3 02/17/2018 0129   RBC 5.39 02/17/2018 0129   HGB 16.5 02/17/2018 0129   HCT 52.1 (H) 02/17/2018 0129   PLT 194 02/17/2018 0129   MCV 96.7 02/17/2018 0129   MCH 30.6 02/17/2018 0129   MCHC 31.7 02/17/2018 0129   RDW 13.2 02/17/2018 0129   LYMPHSABS 1.3 02/16/2018 1441   MONOABS 1.0 02/16/2018 1441   EOSABS 0.0 02/16/2018 1441   BASOSABS 0.1 02/16/2018 1441    Lithium Lvl  Date Value Ref Range Status  02/17/2018 0.38 (L) 0.60 - 1.20 mmol/L Final    Comment:    Performed at [facility], [place] [address] [place], [place] [address]     No results found for: "PHENYTOIN", "PHENOBARB", "VALPROATE", "CBMZ"   Labs July 07, 2018 incl Li 0.9,  December 14, 2018 = Cr 1.29, Calcium 10.8, CMP normal  Normal lipids and PTH  12/22 Cr 1.34, Calcium 10.4   .res Assessment: Plan:    Treysen was seen today for follow-up.  Diagnoses and all orders for this visit:  Bipolar I disorder ([place]) -     Lithium level  Mild cognitive impairment  Supportive therapy dealing with age related problems and health .  No worsening of his health since his last visit..  Not regained full capacity.  Cannot drive anymore DT aging.  Cognition seems stable.  Labs from June 18, 2021 showed creatinine 1.34 and calcium 10.4.  There was no lithium level evident. Cannot get lithium levels reliably but reduced dose to reduce toxicity risk.  Bipolar has been stable for years.  No mood swings nor depression.  No problems with any worsening of mood since the reduction in lithium to the current dose of 450 mg daily.  He is not sure if it helped his balance or not but he is not complaining of any major balance problems at the moment. Counseled patient regarding potential benefits, risks, and side effects of  lithium to include potential risk of lithium affecting thyroid and renal function.  Discussed need for periodic lab monitoring to determine drug level and to assess for  potential adverse effects.  Counseled patient regarding signs and symptoms of lithium toxicity and advised that they notify office immediately or seek urgent medical attention if experiencing these signs and symptoms.  Patient advised to contact office with any questions or concerns.  Continue lithium to CR 450 1 each evening.   Counseled patient regarding potential benefits, risks, and side effects of lithium to include potential risk of lithium affecting thyroid and renal function.  Discussed need for periodic lab monitoring to determine drug level and to assess for potential adverse effects.  Counseled patient regarding signs and symptoms of lithium toxicity and advised that they notify office immediately or seek urgent medical attention if experiencing these signs and symptoms.  Patient advised to contact office with any questions or concerns. Discussed the importance of avoiding lithium toxicity and how with aging the body has more difficulty eliminating lithium.  Therefore lithium dosages may need to be decreased as 1 ages.  Very important that we get a lithium level as soon as possible.  They agree Check lithium level still recommended No med changes today  Mild cognitive impairment is ongoing and may have progressed to the point of dementia.  He cannot remember things such as when he had his last physical exam.  He does believe he had a physical however within the last year.    He is no longer driving and he is agreeable with this plan.  This appt was 30 mins.  FU 6 mos or earlier PRN.  Lynder Parents, MD, DFAPA   Consider reducing lithium Please see After Visit Summary for patient specific instructions.  No future appointments.    Orders Placed This Encounter  Procedures   Lithium level       -------------------------------

## 2022-01-01 DIAGNOSIS — L853 Xerosis cutis: Secondary | ICD-10-CM | POA: Diagnosis not present

## 2022-01-01 DIAGNOSIS — I872 Venous insufficiency (chronic) (peripheral): Secondary | ICD-10-CM | POA: Diagnosis not present

## 2022-01-01 DIAGNOSIS — L570 Actinic keratosis: Secondary | ICD-10-CM | POA: Diagnosis not present

## 2022-01-23 DIAGNOSIS — M79672 Pain in left foot: Secondary | ICD-10-CM | POA: Diagnosis not present

## 2022-01-23 DIAGNOSIS — L84 Corns and callosities: Secondary | ICD-10-CM | POA: Diagnosis not present

## 2022-01-23 DIAGNOSIS — M79671 Pain in right foot: Secondary | ICD-10-CM | POA: Diagnosis not present

## 2022-01-23 DIAGNOSIS — B351 Tinea unguium: Secondary | ICD-10-CM | POA: Diagnosis not present

## 2022-01-29 DIAGNOSIS — Z8673 Personal history of transient ischemic attack (TIA), and cerebral infarction without residual deficits: Secondary | ICD-10-CM | POA: Diagnosis not present

## 2022-01-29 DIAGNOSIS — I482 Chronic atrial fibrillation, unspecified: Secondary | ICD-10-CM | POA: Diagnosis not present

## 2022-01-29 DIAGNOSIS — E7800 Pure hypercholesterolemia, unspecified: Secondary | ICD-10-CM | POA: Diagnosis not present

## 2022-01-29 DIAGNOSIS — R609 Edema, unspecified: Secondary | ICD-10-CM | POA: Diagnosis not present

## 2022-01-29 DIAGNOSIS — Z0000 Encounter for general adult medical examination without abnormal findings: Secondary | ICD-10-CM | POA: Diagnosis not present

## 2022-01-29 DIAGNOSIS — E291 Testicular hypofunction: Secondary | ICD-10-CM | POA: Diagnosis not present

## 2022-02-26 ENCOUNTER — Other Ambulatory Visit: Payer: Self-pay | Admitting: Cardiology

## 2022-03-05 ENCOUNTER — Other Ambulatory Visit: Payer: Self-pay | Admitting: Cardiology

## 2022-03-05 DIAGNOSIS — B354 Tinea corporis: Secondary | ICD-10-CM | POA: Diagnosis not present

## 2022-03-05 DIAGNOSIS — I872 Venous insufficiency (chronic) (peripheral): Secondary | ICD-10-CM | POA: Diagnosis not present

## 2022-03-05 DIAGNOSIS — L570 Actinic keratosis: Secondary | ICD-10-CM | POA: Diagnosis not present

## 2022-03-05 DIAGNOSIS — L218 Other seborrheic dermatitis: Secondary | ICD-10-CM | POA: Diagnosis not present

## 2022-03-12 ENCOUNTER — Other Ambulatory Visit: Payer: Self-pay | Admitting: Cardiology

## 2022-03-12 DIAGNOSIS — I480 Paroxysmal atrial fibrillation: Secondary | ICD-10-CM

## 2022-03-12 NOTE — Telephone Encounter (Signed)
Prescription refill request for Eliquis received. Indication:Afib  Last office visit:11/18/21 Bing Matter)  Scr: 1.34 (06/18/21)  Age: 88 Weight: 102.5kg  Appropriate dose and refill sent to requested pharmacy.

## 2022-04-02 DIAGNOSIS — L853 Xerosis cutis: Secondary | ICD-10-CM | POA: Diagnosis not present

## 2022-04-02 DIAGNOSIS — B354 Tinea corporis: Secondary | ICD-10-CM | POA: Diagnosis not present

## 2022-04-02 DIAGNOSIS — I872 Venous insufficiency (chronic) (peripheral): Secondary | ICD-10-CM | POA: Diagnosis not present

## 2022-04-02 DIAGNOSIS — L570 Actinic keratosis: Secondary | ICD-10-CM | POA: Diagnosis not present

## 2022-05-04 ENCOUNTER — Ambulatory Visit: Payer: Medicare Other | Admitting: Physician Assistant

## 2022-05-17 NOTE — Progress Notes (Unsigned)
Office Visit    Patient Name: Sean Bishop Date of Encounter: 05/18/2022  PCP:  Moshe Cipro, NP   [facility]  Cardiologist:  Gypsy Balsam, MD  Advanced Practice Provider:  No care team member to display Electrophysiologist:  None   HPI    Sean Bishop is a 88 y.o. male with a past medical history significant for permanent atrial fibrillation, anticoagulated on Eliquis, essential hypertension including healthy with EF 50%, dyslipidemia, bipolar disorder, mitral regurgitation presents today for follow-up  Patient was last seen by Dr. Bing Matter last year.  He has been doing well.  He denies chest pain/tightness, palpitations, dizziness, and passing out.  Today, he feels good. No issues with SOB or chest pain. He has been living at a retirement home. His blood pressure is well controlled today. He states he has had no issues with his heart and palpitations. He remains compliant with his medications. He has not had any bleeding issues. Recent labs done 01/2022 reviewed.   Reports no shortness of breath nor dyspnea on exertion. Reports no chest pain, pressure, or tightness. No edema, orthopnea, PND. Reports no palpitations.   Past Medical History    Past Medical History:  Diagnosis Date   AF (paroxysmal atrial fibrillation) (HCC) 02/16/2018   Atrial fibrillation (HCC)    Atrial fibrillation with RVR (HCC) 02/16/2018   Bipolar 1 disorder (HCC)    Bipolar disorder (HCC) 02/17/2018   Dizziness 02/26/2020   DJD (degenerative joint disease)    Dyslipidemia 02/26/2020   Gait instability 02/16/2018   Hypertension    Mitral regurgitation    mild    Polycythemia 02/16/2018   Renal insufficiency    Stroke [facility])    Tremor 02/16/2018   Tremors of nervous system 01/2018   Past Surgical History:  Procedure Laterality Date   SHOULDER SURGERY      Allergies  Allergies  Allergen Reactions   Lipitor [Atorvastatin] Other (See Comments)    Joint soreness     EKGs/Labs/Other Studies Reviewed:   The following studies were reviewed today:  12/02/2021 echocardiogram  IMPRESSIONS     1. Assymetric septal hypertrophy. Left ventricular ejection fraction, by  estimation, is 45 to 50%. The left ventricle has mildly decreased  function. The left ventricle has no regional wall motion abnormalities.  Left ventricular diastolic parameters are  indeterminate.   2. Right ventricular systolic function is normal. The right ventricular  size is normal. There is moderately elevated pulmonary artery systolic  pressure.   3. Left atrial size was severely dilated.   4. The mitral valve is normal in structure. Severe mitral valve  regurgitation. No evidence of mitral stenosis.   5. The aortic valve is normal in structure. Aortic valve regurgitation is  not visualized. No aortic stenosis is present.   6. The inferior vena cava is normal in size with greater than 50%  respiratory variability, suggesting right atrial pressure of 3 mmHg.   FINDINGS   Left Ventricle: Asymetric septal hypertrophy. Left ventricular ejection  fraction, by estimation, is 45 to 50%. The left ventricle has mildly  decreased function. The left ventricle has no regional wall motion  abnormalities. The left ventricular internal  cavity size was normal in size. There is no left ventricular hypertrophy.  Left ventricular diastolic parameters are indeterminate.   Right Ventricle: The right ventricular size is normal. No increase in  right ventricular wall thickness. Right ventricular systolic function is  normal. There is moderately elevated pulmonary  artery systolic pressure.  The tricuspid regurgitant velocity is  3.56 m/s, and with an assumed right atrial pressure of 3 mmHg, the  estimated right ventricular systolic pressure is [id number] mmHg.   Left Atrium: Left atrial size was severely dilated.   Right Atrium: Right atrial size was normal in size.   Pericardium: There is no  evidence of pericardial effusion.   Mitral Valve: The mitral valve is normal in structure. Severe mitral valve  regurgitation. No evidence of mitral valve stenosis.   Tricuspid Valve: The tricuspid valve is normal in structure. Tricuspid  valve regurgitation is not demonstrated. No evidence of tricuspid  stenosis.   Aortic Valve: The aortic valve is normal in structure. Aortic valve  regurgitation is not visualized. No aortic stenosis is present.   Pulmonic Valve: The pulmonic valve was normal in structure. Pulmonic valve  regurgitation is not visualized. No evidence of pulmonic stenosis.   Aorta: The aortic root is normal in size and structure.   Venous: The inferior vena cava is normal in size with greater than 50%  respiratory variability, suggesting right atrial pressure of 3 mmHg.   IAS/Shunts: No atrial level shunt detected by color flow Doppler.    EKG:  EKG is not ordered today.   Recent Labs: 06/18/2021: BUN 17; Creatinine, Ser 1.34; Potassium 4.4; Sodium 137  Recent Lipid Panel    Component Value Date/Time   CHOL 111 09/02/2020 1423   TRIG 154 (H) 09/02/2020 1423   HDL 34 (L) 09/02/2020 1423   CHOLHDL 3.3 09/02/2020 1423   CHOLHDL 4.8 08/24/2009 0645   VLDL 24 08/24/2009 0645   LDLCALC 51 09/02/2020 1423   LDLDIRECT 55 09/02/2020 1423    Risk Assessment/Calculations:   CHA2DS2-VASc Score = 3   This indicates a 3.2% annual risk of stroke. The patient's score is based upon: CHF History: 0 HTN History: 1 Diabetes History: 0 Stroke History: 0 Vascular Disease History: 0 Age Score: 2 Gender Score: 0     Home Medications   Current Meds  Medication Sig   acetaminophen (TYLENOL) 500 MG tablet Take 500 mg by mouth 2 (two) times daily.    Ascorbic Acid (VITAMIN C PO) Take 1 tablet by mouth daily.   diltiazem (CARDIZEM CD) 120 MG 24 hr capsule TAKE ONE CAPSULE BY MOUTH DAILY   ELIQUIS 5 MG TABS tablet TAKE 1 TABLET BY MOUTH TWICE DAILY.   ibandronate  (BONIVA) 150 MG tablet Take 150 mg by mouth every 30 (thirty) days.   latanoprost (XALATAN) 0.005 % ophthalmic solution Place 1 drop into both eyes at bedtime.   lithium carbonate (ESKALITH) 450 MG CR tablet TAKE ONE TABLET IN THE EVENING (Patient taking differently: Take 450 mg by mouth at bedtime. TAKE ONE TABLET IN THE EVENING)   Multiple Vitamin (MULTIVITAMIN WITH MINERALS) TABS tablet Take 1 tablet by mouth daily.   pravastatin (PRAVACHOL) 20 MG tablet Take 1 tablet (20 mg total) by mouth every evening.   testosterone cypionate (DEPOTESTOSTERONE CYPIONATE) 200 MG/ML injection Inject 200 mg into the muscle every 21 ( twenty-one) days. Every 3 weeks   timolol (TIMOPTIC) 0.5 % ophthalmic solution Place 1 drop into both eyes daily.     Review of Systems      All other systems reviewed and are otherwise negative except as noted above.  Physical Exam    VS:  BP 126/70   Pulse (!) 58   Ht 5\' 6"  (1.676 m)   Wt 220 lb 9.6 oz (100.1 kg)  SpO2 97%   BMI 35.61 kg/m  , BMI Body mass index is 35.61 kg/m.  Wt Readings from Last 3 Encounters:  05/18/22 220 lb 9.6 oz (100.1 kg)  11/18/21 226 lb (102.5 kg)  04/30/21 214 lb (97.1 kg)     GEN: Well nourished, well developed, in no acute distress. HEENT: normal. Neck: Supple, no JVD, carotid bruits, or masses. Cardiac: irregularly irregular, no murmurs, rubs, or gallops. No clubbing, cyanosis, edema.  Radials/PT 2+ and equal bilaterally.  Respiratory:  Respirations regular and unlabored, clear to auscultation bilaterally. GI: Soft, nontender, nondistended. MS: No deformity or atrophy. Skin: Warm and dry, no rash. Neuro:  Strength and sensation are intact. Psych: Normal affect.  Assessment & Plan    Dyslipidemia -LDL at goal, 4  -PCP helps manage -Lipid panel due 11/2022 -continue Pravastatin 20mg  daily  Permanent Atrial fibrillation -he has not had any palpitations -no issues with bleeding  -remains on Cardizem and  Eliquis -CHADS2-VASc 3  Nonrheumatic mitral valve regurgitation -echocardiogram due 11/2022  -asymptomatic at this time       Disposition: Follow up 1 year with Jenne Campus, MD or APP.  Signed, Elgie Collard, PA-C 05/18/2022, 4:28 PM [facility]

## 2022-05-18 ENCOUNTER — Encounter: Payer: Self-pay | Admitting: Physician Assistant

## 2022-05-18 ENCOUNTER — Ambulatory Visit: Payer: Medicare Other | Attending: Physician Assistant | Admitting: Physician Assistant

## 2022-05-18 VITALS — BP 126/70 | HR 58 | Ht 66.0 in | Wt 220.6 lb

## 2022-05-18 DIAGNOSIS — Z79899 Other long term (current) drug therapy: Secondary | ICD-10-CM

## 2022-05-18 DIAGNOSIS — I4821 Permanent atrial fibrillation: Secondary | ICD-10-CM

## 2022-05-18 DIAGNOSIS — E785 Hyperlipidemia, unspecified: Secondary | ICD-10-CM

## 2022-05-18 DIAGNOSIS — I340 Nonrheumatic mitral (valve) insufficiency: Secondary | ICD-10-CM

## 2022-05-18 DIAGNOSIS — I10 Essential (primary) hypertension: Secondary | ICD-10-CM

## 2022-05-18 DIAGNOSIS — F319 Bipolar disorder, unspecified: Secondary | ICD-10-CM

## 2022-05-18 NOTE — Patient Instructions (Signed)
Medication Instructions:  Your physician recommends that you continue on your current medications as directed. Please refer to the Current Medication list given to you today.  *If you need a refill on your cardiac medications before your next appointment, please call your pharmacy*   Lab Work: None If you have labs (blood work) drawn today and your tests are completely normal, you will receive your results only by: MyChart Message (if you have MyChart) OR A paper copy in the mail If you have any lab test that is abnormal or we need to change your treatment, we will call you to review the results.   Testing/Procedures: Your physician has requested that you have an echocardiogram in June 2024. Echocardiography is a painless test that uses sound waves to create images of your heart. It provides your doctor with information about the size and shape of your heart and how well your heart's chambers and valves are working. This procedure takes approximately one hour. There are no restrictions for this procedure. Please do NOT wear cologne, perfume, aftershave, or lotions (deodorant is allowed). Please arrive 15 minutes prior to your appointment time.    Follow-Up: At [facility], you and your health needs are our priority.  As part of our continuing mission to provide you with exceptional heart care, we have created designated Provider Care Teams.  These Care Teams include your primary Cardiologist (physician) and Advanced Practice Providers (APPs -  Physician Assistants and Nurse Practitioners) who all work together to provide you with the care you need, when you need it.  Your next appointment:   1 year(s)  The format for your next appointment:   In Person  Provider:   Gypsy Balsam, MD   Important Information About Sugar

## 2022-06-04 DIAGNOSIS — L570 Actinic keratosis: Secondary | ICD-10-CM | POA: Diagnosis not present

## 2022-06-04 DIAGNOSIS — B354 Tinea corporis: Secondary | ICD-10-CM | POA: Diagnosis not present

## 2022-06-04 DIAGNOSIS — I872 Venous insufficiency (chronic) (peripheral): Secondary | ICD-10-CM | POA: Diagnosis not present

## 2022-06-12 DIAGNOSIS — M79672 Pain in left foot: Secondary | ICD-10-CM | POA: Diagnosis not present

## 2022-06-12 DIAGNOSIS — M79671 Pain in right foot: Secondary | ICD-10-CM | POA: Diagnosis not present

## 2022-06-12 DIAGNOSIS — B351 Tinea unguium: Secondary | ICD-10-CM | POA: Diagnosis not present

## 2022-06-12 DIAGNOSIS — L84 Corns and callosities: Secondary | ICD-10-CM | POA: Diagnosis not present

## 2022-06-15 ENCOUNTER — Other Ambulatory Visit: Payer: Self-pay | Admitting: Psychiatry

## 2022-06-15 DIAGNOSIS — F319 Bipolar disorder, unspecified: Secondary | ICD-10-CM

## 2022-06-24 DIAGNOSIS — H524 Presbyopia: Secondary | ICD-10-CM | POA: Diagnosis not present

## 2022-06-24 DIAGNOSIS — H52223 Regular astigmatism, bilateral: Secondary | ICD-10-CM | POA: Diagnosis not present

## 2022-06-24 DIAGNOSIS — H5203 Hypermetropia, bilateral: Secondary | ICD-10-CM | POA: Diagnosis not present

## 2022-06-24 DIAGNOSIS — H401131 Primary open-angle glaucoma, bilateral, mild stage: Secondary | ICD-10-CM | POA: Diagnosis not present

## 2022-06-25 ENCOUNTER — Ambulatory Visit (INDEPENDENT_AMBULATORY_CARE_PROVIDER_SITE_OTHER): Payer: Medicare Other | Admitting: Psychiatry

## 2022-06-25 ENCOUNTER — Encounter: Payer: Self-pay | Admitting: Psychiatry

## 2022-06-25 DIAGNOSIS — G3184 Mild cognitive impairment, so stated: Secondary | ICD-10-CM | POA: Diagnosis not present

## 2022-06-25 DIAGNOSIS — Z79899 Other long term (current) drug therapy: Secondary | ICD-10-CM

## 2022-06-25 DIAGNOSIS — F319 Bipolar disorder, unspecified: Secondary | ICD-10-CM

## 2022-06-25 MED ORDER — LITHIUM CARBONATE ER 300 MG PO TBCR: 300.0000 mg | EXTENDED_RELEASE_TABLET | Freq: Every evening | ORAL | 1 refills | Status: AC

## 2022-06-25 NOTE — Patient Instructions (Signed)
Please get lab test for lithium level.

## 2022-06-25 NOTE — Progress Notes (Signed)
MONTAGUE TYLUTKI OU:[id number] [birth year] 88 y.o.   Subjective:   Patient ID:  Sean Bishop is a 88 y.o. (DOB Feb 06, 1933) male.  Chief Complaint:  Chief Complaint  Patient presents with   Follow-up    Bipolar I disorder [facility]    HPI [name] presents for follow-up of bipolar disorder.  seen August 2020.  He was having balance problems and lithium was reduced from 600 mg daily to 450 mg daily.  He was encouraged to get a lithium level as soon as possible.  08/02/2019 appointment with the following noted and without med changes. Overall doing very well.  Handling lockdown pretty well.  Couldn't go much of anywhere from the facility.  Missed socialization.  They are staying in [facility].  Not a bad facility.  No complaints with it.   No sig exercise.  Some walking in the building. Got both Covid vaccines.  Son Marine scientist in [place]. No sig tremor.  Some walking problems.  12/23/2020 appointment with the following noted:  seen with wife Health problems. On lithium 450 mg daily. Has had physical problems.  Using walker.   W thinks he's doing OK.  However she notes that he is memory has declined.  Sometimes he "remembers" things in a way that is different from the way they actually happened.  However he has not had any unusual mood swings. PCP says he cannot drive anymore bc neuropathy and physical problems. Plan: get lithium level  06/26/21 appt noted: Had trouble getting the lithium level.   Hip pain and needs walker.   No SE noted with lithium. Plan: no changes  12/24/21 appt noted: [facility].  [name] doing well. Doing well.  No unusual problems. Lithium 450 mg HS Not driving. No med swings since reduction in lithium. Patient reports stable mood and denies depressed or irritable moods.  Patient denies any recent difficulty with anxiety.  Patient denies difficulty with sleep initiation or maintenance. Denies appetite disturbance.  Patient reports that energy  and motivation have been good.  Patient reports some difficulty with concentration and memory.  Patient denies any suicidal ideation.  Aware of cognitive problems.   06/25/22 appt noted: [facility].  [name] doing well. Doing well.  No unusual problems. Lithium 450 mg HS Not driving. Requiring walker.   Admits forgetfulness.  No mood swings. Some health problems. No mood problems with mood or anxiety.  No psychosis.  No tremor..  Tolerating meds.  Wife [name]. Adopted kids Jonni Sanger nurse doing well;  Blanch Media was murdered or suicide.  PCP Earle Gell advised against driving bc history of syncope.  Past Psychiatric Medication Trials:  Decades of Lithium,  Lorazepam  Review of Systems:  Review of Systems  Constitutional:  Negative for fatigue.  Musculoskeletal:  Positive for arthralgias, back pain and gait problem.  Neurological:  Positive for weakness. Negative for tremors and syncope.  Psychiatric/Behavioral:  Positive for decreased concentration. Negative for agitation, behavioral problems, confusion, dysphoric mood, hallucinations, self-injury and suicidal ideas. The patient is not nervous/anxious and is not hyperactive.     Medications: I have reviewed the patient's current medications.  Current Outpatient Medications  Medication Sig Dispense Refill   acetaminophen (TYLENOL) 500 MG tablet Take 500 mg by mouth 2 (two) times daily.      Ascorbic Acid (VITAMIN C PO) Take 1 tablet by mouth daily.     diltiazem (CARDIZEM CD) 120 MG 24 hr capsule TAKE ONE CAPSULE BY MOUTH DAILY 90 capsule  3   ELIQUIS 5 MG TABS tablet TAKE 1 TABLET BY MOUTH TWICE DAILY. 180 tablet 1   ibandronate (BONIVA) 150 MG tablet Take 150 mg by mouth every 30 (thirty) days.     latanoprost (XALATAN) 0.005 % ophthalmic solution Place 1 drop into both eyes at bedtime.     Multiple Vitamin (MULTIVITAMIN WITH MINERALS) TABS tablet Take 1 tablet by mouth daily.     pravastatin (PRAVACHOL) 20 MG tablet  Take 1 tablet (20 mg total) by mouth every evening. 90 tablet 1   testosterone cypionate (DEPOTESTOSTERONE CYPIONATE) 200 MG/ML injection Inject 200 mg into the muscle every 21 ( twenty-one) days. Every 3 weeks     timolol (TIMOPTIC) 0.5 % ophthalmic solution Place 1 drop into both eyes daily.     lithium carbonate (LITHOBID) 300 MG ER tablet Take 1 tablet (300 mg total) by mouth every evening. 90 tablet 1   No current facility-administered medications for this visit.    Medication Side Effects: None  Allergies:  Allergies  Allergen Reactions   Lipitor [Atorvastatin] Other (See Comments)    Joint soreness    Past Medical History:  Diagnosis Date   AF (paroxysmal atrial fibrillation) (HCC) 02/16/2018   Atrial fibrillation (HCC)    Atrial fibrillation with RVR (HCC) 02/16/2018   Bipolar 1 disorder (HCC)    Bipolar disorder (HCC) 02/17/2018   Dizziness 02/26/2020   DJD (degenerative joint disease)    Dyslipidemia 02/26/2020   Gait instability 02/16/2018   Hypertension    Mitral regurgitation    mild    Polycythemia 02/16/2018   Renal insufficiency    Stroke [facility])    Tremor 02/16/2018   Tremors of nervous system 01/2018    Family History  Problem Relation Age of Onset   Alcohol abuse Mother    Cancer Father     Social History   Socioeconomic History   Marital status: Married    Spouse name: Not on file   Number of children: Not on file   Years of education: Not on file   Highest education level: Not on file  Occupational History   Not on file  Tobacco Use   Smoking status: Former   Smokeless tobacco: Never  Vaping Use   Vaping Use: Never used  Substance and Sexual Activity   Alcohol use: Yes    Alcohol/week: 1.0 standard drink of alcohol    Types: 1 Glasses of wine per week    Comment: 1 per month   Drug use: No   Sexual activity: Not on file  Other Topics Concern   Not on file  Social History Narrative   Not on file   Social Determinants of Health    Financial Resource Strain: Not on file  Food Insecurity: Not on file  Transportation Needs: Not on file  Physical Activity: Not on file  Stress: Not on file  Social Connections: Not on file  Intimate Partner Violence: Not on file    Past Medical History, Surgical history, Social history, and Family history were reviewed and updated as appropriate.   Please see review of systems for further details on the patient's review from today.   Objective:   Physical Exam:  There were no vitals taken for this visit.  Physical Exam Neurological:     Mental Status: He is alert and oriented to person, place, and time. Mental status is at baseline.     Cranial Nerves: No dysarthria.     Motor: No tremor.  Psychiatric:        Attention and Perception: Attention normal.        Mood and Affect: Mood normal. Mood is not anxious, depressed or elated.        Speech: Speech is not slurred.        Behavior: Behavior is cooperative.        Thought Content: Thought content normal. Thought content is not paranoid or delusional. Thought content does not include homicidal or suicidal ideation. Thought content does not include suicidal plan.        Cognition and Memory: Cognition is impaired. Memory is impaired. He exhibits impaired recent memory and impaired remote memory.        Judgment: Judgment normal.     Comments: Talkative with fair focus  Mood good.  No mania. Somewhat difficult to direct but did ask questions. Word finding problems mildly worse      Lab Review:     Component Value Date/Time   NA 137 06/18/2021 1040   K 4.4 06/18/2021 1040   CL 101 06/18/2021 1040   CO2 22 06/18/2021 1040   GLUCOSE 79 06/18/2021 1040   GLUCOSE 93 02/17/2018 0129   BUN 17 06/18/2021 1040   CREATININE 1.34 (H) 06/18/2021 1040   CALCIUM 10.4 (H) 06/18/2021 1040   PROT 7.4 09/02/2020 1423   ALBUMIN 4.5 09/02/2020 1423   AST 28 09/02/2020 1423   ALT 17 09/02/2020 1423   ALKPHOS 75 09/02/2020 1423    BILITOT 1.4 (H) 09/02/2020 1423   GFRNONAA >60 02/17/2018 0129   GFRAA >60 02/17/2018 0129       Component Value Date/Time   WBC 8.3 02/17/2018 0129   RBC 5.39 02/17/2018 0129   HGB 16.5 02/17/2018 0129   HCT 52.1 (H) 02/17/2018 0129   PLT 194 02/17/2018 0129   MCV 96.7 02/17/2018 0129   MCH 30.6 02/17/2018 0129   MCHC 31.7 02/17/2018 0129   RDW 13.2 02/17/2018 0129   LYMPHSABS 1.3 02/16/2018 1441   MONOABS 1.0 02/16/2018 1441   EOSABS 0.0 02/16/2018 1441   BASOSABS 0.1 02/16/2018 1441    Lithium Lvl  Date Value Ref Range Status  02/17/2018 0.38 (L) 0.60 - 1.20 mmol/L Final    Comment:    Performed at [facility], [place] [address], [place], [place] [address]     No results found for: "PHENYTOIN", "PHENOBARB", "VALPROATE", "CBMZ"   Labs July 07, 2018 incl Li 0.9,  December 14, 2018 = Cr 1.29, Calcium 10.8, CMP normal  Normal lipids and PTH  12/22 Cr 1.34, Calcium 10.4  Cannot see any labs from 2023 in EPIC  .res Assessment: Plan:    Charlee was seen today for follow-up.  Diagnoses and all orders for this visit:  Bipolar I disorder ([place]) -     lithium carbonate (LITHOBID) 300 MG ER tablet; Take 1 tablet (300 mg total) by mouth every evening. -     Lithium level -     Basic metabolic panel -     TSH  Mild cognitive impairment  Lithium use -     Lithium level -     Basic metabolic panel -     TSH  Supportive therapy dealing with age related problems and health .  No worsening of his health since his last visit..  Not regained full capacity.  Cannot drive anymore DT aging.  Cognition seems stable.  Labs from June 18, 2021 showed creatinine 1.34 and calcium 10.4.  There  was no lithium level evident. Cannot get lithium levels reliably but reduced dose to reduce toxicity risk.  Bipolar has been stable for many years.  No mood swings nor depression.  No problems with any worsening of mood since the reduction in lithium to the current dose of 450 mg daily.    Counseled patient regarding potential benefits, risks, and side effects of lithium to include potential risk of lithium affecting thyroid and renal function.  Discussed need for periodic lab monitoring to determine drug level and to assess for potential adverse effects.  Counseled patient regarding signs and symptoms of lithium toxicity and advised that they notify office immediately or seek urgent medical attention if experiencing these signs and symptoms.  Patient advised to contact office with any questions or concerns.  Discussed the importance of avoiding lithium toxicity and how with aging the body has more difficulty eliminating lithium.  Therefore lithium dosages may need to be decreased as 1 ages.  Very important that we get a lithium level as soon as possible.  They agree Check lithium level still recommended strongly.  He's forgetting and this is increasing risk. Therefore reduce lithium to 300 mg daily and still get labs ASAP No med changes today  Mild cognitive impairment is ongoing and may have progressed to the point of dementia.  He cannot remember things such as when he had his last physical exam.  He does believe he had a physical however within the last year.    He is no longer driving and he is agreeable with this plan.  This appt was 30 mins.  FU 6 mos or earlier PRN.  Lynder Parents, MD, DFAPA   Consider reducing lithium Please see After Visit Summary for patient specific instructions.  Future Appointments  Date Time Provider [place]  12/08/2022 11:30 AM [id number]      Orders Placed This Encounter  Procedures   Lithium level   Basic metabolic panel   TSH       -------------------------------

## 2022-06-30 ENCOUNTER — Telehealth: Payer: Self-pay | Admitting: Psychiatry

## 2022-06-30 NOTE — Telephone Encounter (Signed)
Pt's wife, [name] called at 9:34a.  She is not showing on a DPR.  She said pt saw Dr Clovis Pu around Christmas.  Pt's dosage of Lithium was changed.  She is unclear if she should just start him on the new dosage or not.  I read her the note in the chart, but I think she would feel better hearing it from clinical staff.   Next appt 6/27

## 2022-06-30 NOTE — Telephone Encounter (Signed)
Spoke to wife

## 2022-07-01 ENCOUNTER — Inpatient Hospital Stay (HOSPITAL_COMMUNITY)
Admission: EM | Admit: 2022-07-01 | Discharge: 2022-07-08 | DRG: 177 | Disposition: A | Discharge status: Skilled Nursing Facility | Payer: Medicare Other | Source: Skilled Nursing Facility | Attending: Student | Admitting: Student

## 2022-07-01 ENCOUNTER — Other Ambulatory Visit: Payer: Self-pay

## 2022-07-01 ENCOUNTER — Encounter (HOSPITAL_COMMUNITY): Payer: Self-pay

## 2022-07-01 ENCOUNTER — Emergency Department (HOSPITAL_COMMUNITY): Payer: Medicare Other

## 2022-07-01 DIAGNOSIS — S0990XA Unspecified injury of head, initial encounter: Secondary | ICD-10-CM | POA: Diagnosis not present

## 2022-07-01 DIAGNOSIS — G9341 Metabolic encephalopathy: Secondary | ICD-10-CM | POA: Diagnosis present

## 2022-07-01 DIAGNOSIS — I10 Essential (primary) hypertension: Secondary | ICD-10-CM | POA: Diagnosis not present

## 2022-07-01 DIAGNOSIS — F319 Bipolar disorder, unspecified: Secondary | ICD-10-CM | POA: Diagnosis not present

## 2022-07-01 DIAGNOSIS — T796XXA Traumatic ischemia of muscle, initial encounter: Secondary | ICD-10-CM | POA: Insufficient documentation

## 2022-07-01 DIAGNOSIS — R2681 Unsteadiness on feet: Secondary | ICD-10-CM | POA: Diagnosis present

## 2022-07-01 DIAGNOSIS — Z6835 Body mass index (BMI) 35.0-35.9, adult: Secondary | ICD-10-CM | POA: Diagnosis not present

## 2022-07-01 DIAGNOSIS — F0283 Dementia in other diseases classified elsewhere, unspecified severity, with mood disturbance: Secondary | ICD-10-CM | POA: Diagnosis present

## 2022-07-01 DIAGNOSIS — R262 Difficulty in walking, not elsewhere classified: Secondary | ICD-10-CM | POA: Diagnosis not present

## 2022-07-01 DIAGNOSIS — R410 Disorientation, unspecified: Secondary | ICD-10-CM | POA: Diagnosis not present

## 2022-07-01 DIAGNOSIS — M2550 Pain in unspecified joint: Secondary | ICD-10-CM | POA: Diagnosis not present

## 2022-07-01 DIAGNOSIS — R251 Tremor, unspecified: Secondary | ICD-10-CM | POA: Diagnosis present

## 2022-07-01 DIAGNOSIS — W010XXA Fall on same level from slipping, tripping and stumbling without subsequent striking against object, initial encounter: Secondary | ICD-10-CM | POA: Diagnosis present

## 2022-07-01 DIAGNOSIS — Z87891 Personal history of nicotine dependence: Secondary | ICD-10-CM | POA: Diagnosis not present

## 2022-07-01 DIAGNOSIS — E669 Obesity, unspecified: Secondary | ICD-10-CM | POA: Diagnosis not present

## 2022-07-01 DIAGNOSIS — E871 Hypo-osmolality and hyponatremia: Secondary | ICD-10-CM | POA: Diagnosis not present

## 2022-07-01 DIAGNOSIS — H409 Unspecified glaucoma: Secondary | ICD-10-CM | POA: Diagnosis not present

## 2022-07-01 DIAGNOSIS — M6281 Muscle weakness (generalized): Secondary | ICD-10-CM | POA: Diagnosis not present

## 2022-07-01 DIAGNOSIS — I5042 Chronic combined systolic (congestive) and diastolic (congestive) heart failure: Secondary | ICD-10-CM | POA: Diagnosis present

## 2022-07-01 DIAGNOSIS — I130 Hypertensive heart and chronic kidney disease with heart failure and stage 1 through stage 4 chronic kidney disease, or unspecified chronic kidney disease: Secondary | ICD-10-CM | POA: Diagnosis present

## 2022-07-01 DIAGNOSIS — I6782 Cerebral ischemia: Secondary | ICD-10-CM | POA: Diagnosis not present

## 2022-07-01 DIAGNOSIS — E291 Testicular hypofunction: Secondary | ICD-10-CM | POA: Diagnosis not present

## 2022-07-01 DIAGNOSIS — Z8673 Personal history of transient ischemic attack (TIA), and cerebral infarction without residual deficits: Secondary | ICD-10-CM

## 2022-07-01 DIAGNOSIS — S199XXA Unspecified injury of neck, initial encounter: Secondary | ICD-10-CM | POA: Diagnosis not present

## 2022-07-01 DIAGNOSIS — Z7901 Long term (current) use of anticoagulants: Secondary | ICD-10-CM

## 2022-07-01 DIAGNOSIS — Z79899 Other long term (current) drug therapy: Secondary | ICD-10-CM | POA: Diagnosis not present

## 2022-07-01 DIAGNOSIS — W19XXXA Unspecified fall, initial encounter: Secondary | ICD-10-CM

## 2022-07-01 DIAGNOSIS — E785 Hyperlipidemia, unspecified: Secondary | ICD-10-CM | POA: Diagnosis not present

## 2022-07-01 DIAGNOSIS — R4182 Altered mental status, unspecified: Secondary | ICD-10-CM | POA: Diagnosis not present

## 2022-07-01 DIAGNOSIS — R531 Weakness: Secondary | ICD-10-CM

## 2022-07-01 DIAGNOSIS — I4819 Other persistent atrial fibrillation: Secondary | ICD-10-CM | POA: Diagnosis present

## 2022-07-01 DIAGNOSIS — M4692 Unspecified inflammatory spondylopathy, cervical region: Secondary | ICD-10-CM | POA: Diagnosis not present

## 2022-07-01 DIAGNOSIS — R269 Unspecified abnormalities of gait and mobility: Secondary | ICD-10-CM | POA: Diagnosis present

## 2022-07-01 DIAGNOSIS — Z888 Allergy status to other drugs, medicaments and biological substances status: Secondary | ICD-10-CM | POA: Diagnosis not present

## 2022-07-01 DIAGNOSIS — G319 Degenerative disease of nervous system, unspecified: Secondary | ICD-10-CM | POA: Diagnosis not present

## 2022-07-01 DIAGNOSIS — U071 COVID-19: Secondary | ICD-10-CM | POA: Diagnosis not present

## 2022-07-01 DIAGNOSIS — R17 Unspecified jaundice: Secondary | ICD-10-CM | POA: Diagnosis not present

## 2022-07-01 DIAGNOSIS — N1831 Chronic kidney disease, stage 3a: Secondary | ICD-10-CM | POA: Diagnosis not present

## 2022-07-01 DIAGNOSIS — Z66 Do not resuscitate: Secondary | ICD-10-CM | POA: Insufficient documentation

## 2022-07-01 DIAGNOSIS — R4702 Dysphasia: Secondary | ICD-10-CM | POA: Diagnosis not present

## 2022-07-01 DIAGNOSIS — M47813 Spondylosis without myelopathy or radiculopathy, cervicothoracic region: Secondary | ICD-10-CM | POA: Diagnosis not present

## 2022-07-01 DIAGNOSIS — R4189 Other symptoms and signs involving cognitive functions and awareness: Secondary | ICD-10-CM | POA: Diagnosis not present

## 2022-07-01 DIAGNOSIS — Z7401 Bed confinement status: Secondary | ICD-10-CM | POA: Diagnosis not present

## 2022-07-01 DIAGNOSIS — R1311 Dysphagia, oral phase: Secondary | ICD-10-CM | POA: Diagnosis not present

## 2022-07-01 DIAGNOSIS — K219 Gastro-esophageal reflux disease without esophagitis: Secondary | ICD-10-CM | POA: Diagnosis not present

## 2022-07-01 DIAGNOSIS — M47812 Spondylosis without myelopathy or radiculopathy, cervical region: Secondary | ICD-10-CM | POA: Diagnosis not present

## 2022-07-01 DIAGNOSIS — R000 Tachycardia, unspecified: Secondary | ICD-10-CM | POA: Diagnosis not present

## 2022-07-01 NOTE — ED Triage Notes (Signed)
Pt BIB EMS for a witnessed fall. Pt did not hit his head. Per EMS, wife concerned that pt was not comprehending and not able to answer questions. Pt has hx of tremors. Pt having abnormal responses to certain questions. Pt received 500cc of NS. Pt is COVID+.

## 2022-07-01 NOTE — ED Provider Notes (Signed)
[facility] EMERGENCY DEPARTMENT Provider Note   CSN: [id number] Arrival date & time: 07/01/22  2144     History  Chief Complaint  Patient presents with   Altered Mental Status    Sean Bishop is a 89 y.o. male.  Patient is an 89 year old male with a past medical history of A-fib on Eliquis, bipolar disorder on lithium, hypertension, CVA presenting to the emergency department with altered mental status after a fall.  Patient reports that he was sitting at a table at his assisted living earlier today when he tried to get up from the table he tripped and fell.  He denies hitting his head or losing consciousness.  He states that his wife was concerned that he was confused after this and wanted him to come to the emergency department.  The patient denies any fevers or chills, nausea or vomiting, headache or chest pain.  He states he was recently diagnosed with COVID and has had a cough but denies any worsening shortness of breath.  I attempted to call the wife for collateral information and was unable to get ahold of her. Unclear by patient if she was planning on coming to ED.  The history is provided by the patient and the EMS personnel.  Altered Mental Status      Home Medications Prior to Admission medications   Medication Sig Start Date End Date Taking? Authorizing Provider  acetaminophen (TYLENOL) 500 MG tablet Take 500 mg by mouth 2 (two) times daily.     [provider]  Ascorbic Acid (VITAMIN C PO) Take 1 tablet by mouth daily.    [provider]  diltiazem (CARDIZEM CD) 120 MG 24 hr capsule TAKE ONE CAPSULE BY MOUTH DAILY 03/05/22   Georgeanna Lea, MD  ELIQUIS 5 MG TABS tablet TAKE 1 TABLET BY MOUTH TWICE DAILY. 03/12/22   Georgeanna Lea, MD  ibandronate (BONIVA) 150 MG tablet Take 150 mg by mouth every 30 (thirty) days. 06/19/19   [provider]  latanoprost (XALATAN) 0.005 % ophthalmic solution Place 1 drop into both eyes  at bedtime. 08/19/13   [provider]  lithium carbonate (LITHOBID) 300 MG ER tablet Take 1 tablet (300 mg total) by mouth every evening. 06/25/22   Cottle, Steva Ready., MD  Multiple Vitamin (MULTIVITAMIN WITH MINERALS) TABS tablet Take 1 tablet by mouth daily.    [provider]  pravastatin (PRAVACHOL) 20 MG tablet Take 1 tablet (20 mg total) by mouth every evening. 02/26/22   Georgeanna Lea, MD  testosterone cypionate (DEPOTESTOSTERONE CYPIONATE) 200 MG/ML injection Inject 200 mg into the muscle every 21 ( twenty-one) days. Every 3 weeks 07/18/19   [provider]  timolol (TIMOPTIC) 0.5 % ophthalmic solution Place 1 drop into both eyes daily. 05/18/19   [provider]      Allergies    Lipitor [atorvastatin]    Review of Systems   Review of Systems  Physical Exam Updated Vital Signs BP 137/68   Pulse 86   Temp 100.1 F (37.8 C) (Oral)   Resp (!) 22   Wt 99.8 kg   SpO2 97%   BMI 35.51 kg/m  Physical Exam Vitals and nursing note reviewed.  Constitutional:      General: He is not in acute distress.    Appearance: Normal appearance.  HENT:     Head: Normocephalic and atraumatic.     Nose: Nose normal.     Mouth/Throat:  Mouth: Mucous membranes are moist.     Pharynx: Oropharynx is clear.  Eyes:     Extraocular Movements: Extraocular movements intact.     Conjunctiva/sclera: Conjunctivae normal.     Pupils: Pupils are equal, round, and reactive to light.  Neck:     Comments: No midline neck tenderness Cardiovascular:     Rate and Rhythm: Normal rate and regular rhythm.     Heart sounds: Normal heart sounds.  Pulmonary:     Effort: Pulmonary effort is normal.     Breath sounds: Normal breath sounds.  Abdominal:     General: Abdomen is flat.     Palpations: Abdomen is soft.     Tenderness: There is no abdominal tenderness.  Musculoskeletal:        General: Normal range of motion.     Cervical back: Normal range of motion  and neck supple.     Right lower leg: Edema (1+ to shins) present.     Left lower leg: Edema (1+ to shins) present.  Neurological:     General: No focal deficit present.     Mental Status: He is alert and oriented to person, place, and time.     Cranial Nerves: No cranial nerve deficit.     Sensory: No sensory deficit.     Motor: No weakness.     Coordination: Coordination normal.  Psychiatric:        Mood and Affect: Mood normal.        Behavior: Behavior normal.     ED Results / Procedures / Treatments   Labs (all labs ordered are listed, but only abnormal results are displayed) Labs Reviewed  COMPREHENSIVE METABOLIC PANEL  CBC WITH DIFFERENTIAL/PLATELET  URINALYSIS, ROUTINE W REFLEX MICROSCOPIC  LITHIUM LEVEL    EKG EKG Interpretation  Date/Time:  Wednesday July 01 2022 22:12:30 EST Ventricular Rate:  107 PR Interval:  108 QRS Duration: 86 QT Interval:  339 QTC Calculation: 401 R Axis:   33 Text Interpretation: Atrial fibrillation Low voltage, precordial leads Nonspecific repol abnormality, inferior leads Borderline ST elevation, lateral leads Frequent PVCs No significant change since last tracing Confirmed by Elayne Snare ([id number]) on 07/01/2022 10:34:08 PM  Radiology CT Head Wo Contrast  Result Date: 07/01/2022 CLINICAL DATA:  Head and neck trauma. EXAM: CT HEAD WITHOUT CONTRAST CT CERVICAL SPINE WITHOUT CONTRAST TECHNIQUE: Multidetector CT imaging of the head and cervical spine was performed following the standard protocol without intravenous contrast. Multiplanar CT image reconstructions of the cervical spine were also generated. RADIATION DOSE REDUCTION: This exam was performed according to the departmental dose-optimization program which includes automated exposure control, adjustment of the mA and/or kV according to patient size and/or use of iterative reconstruction technique. COMPARISON:  CT examination dated February 16, 2018 FINDINGS: CT HEAD FINDINGS Brain: No  evidence of acute infarction, hemorrhage, extra-axial collection or mass lesion/mass effect. Prominence of the ventricles and sulci secondary to moderate cerebral volume loss. Diffuse low-attenuation of the periventricular white matter presumed advanced chronic microvascular ischemic changes of the white matter. Prominence of the ventricles disproportionate to the volume loss concerning for normal pressure hydrocephalus. Vascular: No hyperdense vessel or unexpected calcification. Skull: Normal. Negative for fracture or focal lesion. Sinuses/Orbits: No acute finding. Other: None. CT CERVICAL SPINE FINDINGS Alignment: Straightening of the cervical spine. Skull base and vertebrae: No acute fracture. No primary bone lesion or focal pathologic process. Soft tissues and spinal canal: No prevertebral fluid or swelling. No visible canal hematoma. Disc levels: Multilevel degenerate  disc disease with disc height loss and marginal osteophytes with associated uncovertebral joint and facet joint arthropathy. C2-C3:  No significant findings. C3-C4: Disc height loss and uncovertebral joint arthropathy with mild right and moderate left neural foraminal stenosis. Mild left facet joint arthropathy. C4-C5: Disc height loss and marked uncovertebral joint arthropathy with moderate-to-severe bilateral neural foraminal stenosis. C5-C6: Disc height loss and uncovertebral joint arthropathy with moderate right and severe left neural foraminal stenosis. C6-C7: Disc height loss and uncovertebral joint arthropathy with mild left neural foraminal stenosis. C7-T1: Disc height loss and facet joint arthropathy. No significant neural foraminal stenosis. Upper chest: Negative. Other: None IMPRESSION: CT HEAD: 1. No acute intracranial abnormality. 2. Moderate cerebral volume loss and advanced chronic microvascular ischemic changes of the white matter. 3. Prominence of the ventricles more than expected for the cerebral volume loss concerning for normal  pressure hydrocephalus. CT CERVICAL SPINE: 1. No acute fracture or traumatic subluxation. 2. Multilevel degenerate disc disease with associated uncovertebral joint and facet joint arthropathy. Electronically Signed   By: Larose Hires D.O.   On: 07/01/2022 23:25   CT Cervical Spine Wo Contrast  Result Date: 07/01/2022 CLINICAL DATA:  Head and neck trauma. EXAM: CT HEAD WITHOUT CONTRAST CT CERVICAL SPINE WITHOUT CONTRAST TECHNIQUE: Multidetector CT imaging of the head and cervical spine was performed following the standard protocol without intravenous contrast. Multiplanar CT image reconstructions of the cervical spine were also generated. RADIATION DOSE REDUCTION: This exam was performed according to the departmental dose-optimization program which includes automated exposure control, adjustment of the mA and/or kV according to patient size and/or use of iterative reconstruction technique. COMPARISON:  CT examination dated February 16, 2018 FINDINGS: CT HEAD FINDINGS Brain: No evidence of acute infarction, hemorrhage, extra-axial collection or mass lesion/mass effect. Prominence of the ventricles and sulci secondary to moderate cerebral volume loss. Diffuse low-attenuation of the periventricular white matter presumed advanced chronic microvascular ischemic changes of the white matter. Prominence of the ventricles disproportionate to the volume loss concerning for normal pressure hydrocephalus. Vascular: No hyperdense vessel or unexpected calcification. Skull: Normal. Negative for fracture or focal lesion. Sinuses/Orbits: No acute finding. Other: None. CT CERVICAL SPINE FINDINGS Alignment: Straightening of the cervical spine. Skull base and vertebrae: No acute fracture. No primary bone lesion or focal pathologic process. Soft tissues and spinal canal: No prevertebral fluid or swelling. No visible canal hematoma. Disc levels: Multilevel degenerate disc disease with disc height loss and marginal osteophytes with  associated uncovertebral joint and facet joint arthropathy. C2-C3:  No significant findings. C3-C4: Disc height loss and uncovertebral joint arthropathy with mild right and moderate left neural foraminal stenosis. Mild left facet joint arthropathy. C4-C5: Disc height loss and marked uncovertebral joint arthropathy with moderate-to-severe bilateral neural foraminal stenosis. C5-C6: Disc height loss and uncovertebral joint arthropathy with moderate right and severe left neural foraminal stenosis. C6-C7: Disc height loss and uncovertebral joint arthropathy with mild left neural foraminal stenosis. C7-T1: Disc height loss and facet joint arthropathy. No significant neural foraminal stenosis. Upper chest: Negative. Other: None IMPRESSION: CT HEAD: 1. No acute intracranial abnormality. 2. Moderate cerebral volume loss and advanced chronic microvascular ischemic changes of the white matter. 3. Prominence of the ventricles more than expected for the cerebral volume loss concerning for normal pressure hydrocephalus. CT CERVICAL SPINE: 1. No acute fracture or traumatic subluxation. 2. Multilevel degenerate disc disease with associated uncovertebral joint and facet joint arthropathy. Electronically Signed   By: Larose Hires D.O.   On: 07/01/2022 23:25  DG Chest 1 View  Result Date: 07/01/2022 CLINICAL DATA:  Altered mental status. EXAM: CHEST  1 VIEW COMPARISON:  Chest x-ray 09/06/2013 FINDINGS: The heart and mediastinal contours are unchanged. Aortic calcification. No focal consolidation. No pulmonary edema. No pleural effusion. No pneumothorax. No acute osseous abnormality. Severe degenerative changes of the bilateral shoulders. IMPRESSION: No active disease. Electronically Signed   By: Tish Frederickson M.D.   On: 07/01/2022 22:44    Procedures Procedures    Medications Ordered in ED Medications - No data to display  ED Course/ Medical Decision Making/ A&P Clinical Course as of 07/02/22 0009  Wed Jul 01, 2022   2331 No acute traumatic injury seen on CT imaging. Labs pending at this time. [VK]  Thu Jul 02, 2022  0009 Patient signed out to Dr. Madilyn Hook in stable condition pending labs. [VK]    Clinical Course User Index [VK] Rexford Maus, DO                           Medical Decision Making This patient presents to the ED with chief complaint(s) of fall, AMS with pertinent past medical history of A-fib on Eliquis, bipolar disorder on lithium, hypertension, hyperlipidemia, prior CVA which further complicates the presenting complaint. The complaint involves an extensive differential diagnosis and also carries with it a high risk of complications and morbidity.    The differential diagnosis includes CHF, mass effect, cervical spine fracture, no other traumatic injuries seen on exam, lithium toxicity, infection, electrolyte abnormality, dehydration  Additional history obtained: Additional history obtained from EMS  Records reviewed Primary Care Documents  ED Course and Reassessment: Upon patient's arrival to the emergency department he is awake and alert and appears to be at his neurologic baseline without any acute complaints or neurologic deficits.  Due to patient's fall on thinners although he denies hitting his head with his concern for confusion, head CT and C-spine will be performed to evaluate for acute traumatic injury.  Will have labs including lithium level to determine the cause of his confusion and will be closely reassessed.  Independent labs interpretation:  The following labs were independently interpreted: pending  Independent visualization of imaging: - I independently visualized the following imaging with scope of interpretation limited to determining acute life threatening conditions related to emergency care: CTH/C-spine, CXR, which revealed no acute disease      Amount and/or Complexity of Data Reviewed Labs: ordered. Radiology: ordered.          Final Clinical  Impression(s) / ED Diagnoses Final diagnoses:  Fall, initial encounter    Rx / DC Orders ED Discharge Orders     None         Rexford Maus, DO 07/02/22 0009

## 2022-07-02 DIAGNOSIS — R17 Unspecified jaundice: Secondary | ICD-10-CM | POA: Diagnosis present

## 2022-07-02 DIAGNOSIS — I130 Hypertensive heart and chronic kidney disease with heart failure and stage 1 through stage 4 chronic kidney disease, or unspecified chronic kidney disease: Secondary | ICD-10-CM | POA: Diagnosis present

## 2022-07-02 DIAGNOSIS — K219 Gastro-esophageal reflux disease without esophagitis: Secondary | ICD-10-CM | POA: Diagnosis not present

## 2022-07-02 DIAGNOSIS — F319 Bipolar disorder, unspecified: Secondary | ICD-10-CM | POA: Diagnosis not present

## 2022-07-02 DIAGNOSIS — Z888 Allergy status to other drugs, medicaments and biological substances status: Secondary | ICD-10-CM | POA: Diagnosis not present

## 2022-07-02 DIAGNOSIS — R410 Disorientation, unspecified: Secondary | ICD-10-CM | POA: Diagnosis present

## 2022-07-02 DIAGNOSIS — E291 Testicular hypofunction: Secondary | ICD-10-CM | POA: Diagnosis not present

## 2022-07-02 DIAGNOSIS — R2681 Unsteadiness on feet: Secondary | ICD-10-CM

## 2022-07-02 DIAGNOSIS — W010XXA Fall on same level from slipping, tripping and stumbling without subsequent striking against object, initial encounter: Secondary | ICD-10-CM | POA: Diagnosis present

## 2022-07-02 DIAGNOSIS — R251 Tremor, unspecified: Secondary | ICD-10-CM

## 2022-07-02 DIAGNOSIS — Z7901 Long term (current) use of anticoagulants: Secondary | ICD-10-CM | POA: Diagnosis not present

## 2022-07-02 DIAGNOSIS — E669 Obesity, unspecified: Secondary | ICD-10-CM | POA: Diagnosis not present

## 2022-07-02 DIAGNOSIS — Z8673 Personal history of transient ischemic attack (TIA), and cerebral infarction without residual deficits: Secondary | ICD-10-CM | POA: Diagnosis not present

## 2022-07-02 DIAGNOSIS — T796XXA Traumatic ischemia of muscle, initial encounter: Secondary | ICD-10-CM | POA: Diagnosis present

## 2022-07-02 DIAGNOSIS — Z6835 Body mass index (BMI) 35.0-35.9, adult: Secondary | ICD-10-CM | POA: Diagnosis not present

## 2022-07-02 DIAGNOSIS — E871 Hypo-osmolality and hyponatremia: Secondary | ICD-10-CM | POA: Insufficient documentation

## 2022-07-02 DIAGNOSIS — I10 Essential (primary) hypertension: Secondary | ICD-10-CM

## 2022-07-02 DIAGNOSIS — R4189 Other symptoms and signs involving cognitive functions and awareness: Secondary | ICD-10-CM | POA: Diagnosis not present

## 2022-07-02 DIAGNOSIS — G9341 Metabolic encephalopathy: Secondary | ICD-10-CM | POA: Diagnosis present

## 2022-07-02 DIAGNOSIS — Z79899 Other long term (current) drug therapy: Secondary | ICD-10-CM | POA: Diagnosis not present

## 2022-07-02 DIAGNOSIS — R1311 Dysphagia, oral phase: Secondary | ICD-10-CM | POA: Diagnosis not present

## 2022-07-02 DIAGNOSIS — U071 COVID-19: Secondary | ICD-10-CM | POA: Diagnosis not present

## 2022-07-02 DIAGNOSIS — Z7401 Bed confinement status: Secondary | ICD-10-CM | POA: Diagnosis not present

## 2022-07-02 DIAGNOSIS — Z87891 Personal history of nicotine dependence: Secondary | ICD-10-CM | POA: Diagnosis not present

## 2022-07-02 DIAGNOSIS — I4819 Other persistent atrial fibrillation: Secondary | ICD-10-CM

## 2022-07-02 DIAGNOSIS — E785 Hyperlipidemia, unspecified: Secondary | ICD-10-CM | POA: Diagnosis present

## 2022-07-02 DIAGNOSIS — Z66 Do not resuscitate: Secondary | ICD-10-CM | POA: Diagnosis present

## 2022-07-02 DIAGNOSIS — H409 Unspecified glaucoma: Secondary | ICD-10-CM | POA: Diagnosis not present

## 2022-07-02 DIAGNOSIS — N1831 Chronic kidney disease, stage 3a: Secondary | ICD-10-CM | POA: Diagnosis present

## 2022-07-02 DIAGNOSIS — I5042 Chronic combined systolic (congestive) and diastolic (congestive) heart failure: Secondary | ICD-10-CM | POA: Diagnosis present

## 2022-07-02 DIAGNOSIS — M6281 Muscle weakness (generalized): Secondary | ICD-10-CM | POA: Diagnosis not present

## 2022-07-02 DIAGNOSIS — M2550 Pain in unspecified joint: Secondary | ICD-10-CM | POA: Diagnosis not present

## 2022-07-02 DIAGNOSIS — F0283 Dementia in other diseases classified elsewhere, unspecified severity, with mood disturbance: Secondary | ICD-10-CM | POA: Diagnosis present

## 2022-07-02 LAB — CBC WITH DIFFERENTIAL/PLATELET
Abs Immature Granulocytes: 0.06 10*3/uL (ref 0.00–0.07)
Basophils Absolute: 0 10*3/uL (ref 0.0–0.1)
Basophils Relative: 0 %
Eosinophils Absolute: 0 10*3/uL (ref 0.0–0.5)
Eosinophils Relative: 0 %
HCT: 49 % (ref 39.0–52.0)
Hemoglobin: 15.7 g/dL (ref 13.0–17.0)
Immature Granulocytes: 1 %
Lymphocytes Relative: 4 %
Lymphs Abs: 0.5 10*3/uL — ABNORMAL LOW (ref 0.7–4.0)
MCH: 30.9 pg (ref 26.0–34.0)
MCHC: 32 g/dL (ref 30.0–36.0)
MCV: 96.5 fL (ref 80.0–100.0)
Monocytes Absolute: 1 10*3/uL (ref 0.1–1.0)
Monocytes Relative: 8 %
Neutro Abs: 11.2 10*3/uL — ABNORMAL HIGH (ref 1.7–7.7)
Neutrophils Relative %: 87 %
Platelets: 207 10*3/uL (ref 150–400)
RBC: 5.08 MIL/uL (ref 4.22–5.81)
RDW: 12.9 % (ref 11.5–15.5)
WBC: 12.8 10*3/uL — ABNORMAL HIGH (ref 4.0–10.5)
nRBC: 0 % (ref 0.0–0.2)

## 2022-07-02 LAB — URINALYSIS, ROUTINE W REFLEX MICROSCOPIC
Bacteria, UA: NONE SEEN
Bilirubin Urine: NEGATIVE
Glucose, UA: NEGATIVE mg/dL
Hgb urine dipstick: NEGATIVE
Ketones, ur: 5 mg/dL — AB
Leukocytes,Ua: NEGATIVE
Nitrite: NEGATIVE
Protein, ur: 30 mg/dL — AB
Specific Gravity, Urine: 1.009 (ref 1.005–1.030)
pH: 6 (ref 5.0–8.0)

## 2022-07-02 LAB — COMPREHENSIVE METABOLIC PANEL
ALT: 23 U/L (ref 0–44)
AST: 37 U/L (ref 15–41)
Albumin: 3.9 g/dL (ref 3.5–5.0)
Alkaline Phosphatase: 68 U/L (ref 38–126)
Anion gap: 8 (ref 5–15)
BUN: 15 mg/dL (ref 8–23)
CO2: 22 mmol/L (ref 22–32)
Calcium: 9.8 mg/dL (ref 8.9–10.3)
Chloride: 101 mmol/L (ref 98–111)
Creatinine, Ser: 1.37 mg/dL — ABNORMAL HIGH (ref 0.61–1.24)
GFR, Estimated: 50 mL/min — ABNORMAL LOW (ref >60)
Glucose, Bld: 119 mg/dL — ABNORMAL HIGH (ref 70–99)
Potassium: 4.2 mmol/L (ref 3.5–5.1)
Sodium: 131 mmol/L — ABNORMAL LOW (ref 135–145)
Total Bilirubin: 1.9 mg/dL — ABNORMAL HIGH (ref 0.3–1.2)
Total Protein: 7 g/dL (ref 6.5–8.1)

## 2022-07-02 LAB — RAPID URINE DRUG SCREEN, HOSP PERFORMED
Amphetamines: NOT DETECTED
Barbiturates: NOT DETECTED
Benzodiazepines: NOT DETECTED
Cocaine: NOT DETECTED
Opiates: NOT DETECTED
Tetrahydrocannabinol: NOT DETECTED

## 2022-07-02 LAB — LITHIUM LEVEL: Lithium Lvl: 0.47 mmol/L — ABNORMAL LOW (ref 0.60–1.20)

## 2022-07-02 LAB — RESP PANEL BY RT-PCR (RSV, FLU A&B, COVID)  RVPGX2
Influenza A by PCR: NEGATIVE
Influenza B by PCR: NEGATIVE
Resp Syncytial Virus by PCR: NEGATIVE
SARS Coronavirus 2 by RT PCR: POSITIVE — AB

## 2022-07-02 LAB — CK: Total CK: 1774 U/L — ABNORMAL HIGH (ref 49–397)

## 2022-07-02 LAB — AMMONIA: Ammonia: 22 umol/L (ref 9–35)

## 2022-07-02 LAB — TSH: TSH: 0.953 u[IU]/mL (ref 0.350–4.500)

## 2022-07-02 LAB — VITAMIN B12: Vitamin B-12: 278 pg/mL (ref 180–914)

## 2022-07-02 MED ORDER — ACETAMINOPHEN 325 MG PO TABS
650.0000 mg | ORAL_TABLET | Freq: Four times a day (QID) | ORAL | Status: DC | PRN
  Administered 2022-07-02: 650 mg via ORAL
  Filled 2022-07-02: qty 2

## 2022-07-02 MED ORDER — ONDANSETRON HCL 4 MG/2ML IJ SOLN: 4.0000 mg | Freq: Four times a day (QID) | INTRAMUSCULAR | Status: DC | PRN

## 2022-07-02 MED ORDER — LATANOPROST 0.005 % OP SOLN
1.0000 [drp] | Freq: Every day | OPHTHALMIC | Status: DC
  Administered 2022-07-02 – 2022-07-07 (×6): 1 [drp] via OPHTHALMIC
  Filled 2022-07-02: qty 2.5

## 2022-07-02 MED ORDER — ADULT MULTIVITAMIN W/MINERALS CH
1.0000 | ORAL_TABLET | Freq: Every day | ORAL | Status: DC
  Administered 2022-07-02 – 2022-07-08 (×7): 1 via ORAL
  Filled 2022-07-02 (×8): qty 1

## 2022-07-02 MED ORDER — APIXABAN 5 MG PO TABS
5.0000 mg | ORAL_TABLET | Freq: Two times a day (BID) | ORAL | Status: DC
  Administered 2022-07-02 – 2022-07-08 (×13): 5 mg via ORAL
  Filled 2022-07-02 (×14): qty 1

## 2022-07-02 MED ORDER — VITAMIN C 500 MG PO TABS
500.0000 mg | ORAL_TABLET | Freq: Two times a day (BID) | ORAL | Status: DC
  Administered 2022-07-02 – 2022-07-08 (×13): 500 mg via ORAL
  Filled 2022-07-02 (×14): qty 1

## 2022-07-02 MED ORDER — ONDANSETRON HCL 4 MG PO TABS: 4.0000 mg | ORAL_TABLET | Freq: Four times a day (QID) | ORAL | Status: DC | PRN

## 2022-07-02 MED ORDER — TIMOLOL MALEATE 0.5 % OP SOLN
1.0000 [drp] | Freq: Every day | OPHTHALMIC | Status: DC
  Administered 2022-07-02 – 2022-07-08 (×7): 1 [drp] via OPHTHALMIC
  Filled 2022-07-02: qty 5

## 2022-07-02 MED ORDER — PRAVASTATIN SODIUM 40 MG PO TABS
20.0000 mg | ORAL_TABLET | Freq: Every evening | ORAL | Status: DC
  Administered 2022-07-02: 20 mg via ORAL
  Filled 2022-07-02: qty 2

## 2022-07-02 MED ORDER — DILTIAZEM HCL ER COATED BEADS 120 MG PO CP24
120.0000 mg | ORAL_CAPSULE | Freq: Every day | ORAL | Status: DC
  Administered 2022-07-02 – 2022-07-08 (×7): 120 mg via ORAL
  Filled 2022-07-02 (×8): qty 1

## 2022-07-02 MED ORDER — SODIUM CHLORIDE 0.9 % IV BOLUS
250.0000 mL | Freq: Once | INTRAVENOUS | Status: AC
  Administered 2022-07-02: 250 mL via INTRAVENOUS

## 2022-07-02 MED ORDER — ACETAMINOPHEN 650 MG RE SUPP: 650.0000 mg | Freq: Four times a day (QID) | RECTAL | Status: DC | PRN

## 2022-07-02 MED ORDER — LITHIUM CARBONATE ER 300 MG PO TBCR
300.0000 mg | EXTENDED_RELEASE_TABLET | Freq: Every evening | ORAL | Status: DC
  Administered 2022-07-02 – 2022-07-08 (×7): 300 mg via ORAL
  Filled 2022-07-02 (×7): qty 1

## 2022-07-02 MED ORDER — ACETAMINOPHEN 325 MG PO TABS
650.0000 mg | ORAL_TABLET | Freq: Once | ORAL | Status: AC
  Administered 2022-07-02: 650 mg via ORAL
  Filled 2022-07-02: qty 2

## 2022-07-02 NOTE — Progress Notes (Signed)
Telephone call to wife for Admission screening completion.

## 2022-07-02 NOTE — ED Notes (Signed)
When last pt made it to end of bed he could sort of stand but felt very unsteady. Neither he nor I were comfortable having him walk from end of bed around to the side. I consider him to weak to walk at this time

## 2022-07-02 NOTE — ED Notes (Signed)
Pt brief changed. Pt soiled breif and bed with urine and stool. Pt bed linens changed, new gown put on pt and blanket provided. No other needs requested at this time. Pt verbalized feeling much more comfortable.

## 2022-07-02 NOTE — ED Notes (Signed)
Help get patient straighten up in bed patient is resting with call bell in reach  

## 2022-07-02 NOTE — ED Notes (Addendum)
Pt has been cleaned up twice since 7:30. Pt found sitting at end of bed a few minutes ago.  Pt is pleasant but confused.

## 2022-07-02 NOTE — ED Notes (Signed)
Brief changed. Pt denies any complaints. Call bell in reach

## 2022-07-02 NOTE — ED Notes (Signed)
Patient unable to stand at this time.

## 2022-07-02 NOTE — H&P (Signed)
History and Physical    Patient: Sean Bishop OEV:[id number] DOB: [birth year] DOA: 07/01/2022 DOS: the patient was seen and examined on 07/02/2022 PCP: Moshe Cipro, NP  [facility] from[facility]   Chief Complaint:  Chief Complaint  Patient presents with   Altered Mental Status   HPI: Sean Bishop is a 89 y.o. male with cognitive impairment, CVA, tremors, bipolar disorder, paroxysmal A-fib, dCM/systolic CHF, GERD and CKD-3A brought to ED by EMS due to confusion, ground-level fall and generalized weakness.  Patient is awake but only oriented to self.  History provided by patient's wife over the phone.  Patient was in his usual state of health until yesterday afternoon when he developed confusion, not following commands, moaning and singing.  He started shaking when he tried to get to his bedroom and went down on the floor.  Denies hitting head or loss of consciousness.  Patient and wife tested positive for COVID-19.  No report of fever, cough, shortness of breath, GI or UTI symptoms.  No formal diagnosis of dementia.  However, at some point, there was concern about Parkinson disease and he was started on Parkinson's medication that has impacted his cognitive ability.  His cognition improved after stopping Parkinson medication.  Patient responds no to pain, shortness of breath, nausea, vomiting, abdominal pain, problems urinating, focal numbness, weakness or tingling.  He admits to dry cough.  He recalls for but not able to describe.   Of note, patient was seen by his psychiatrist on 06/25/2022 and his lithium was reduced from 450 mg to 300 mg  Patient denies smoking cigarette, drinking alcohol recreational drug use.  He has DNR paper at bedside.  In ED, stable vitals.  Saturating in mid 90s on room air.  COVID-19 PCR positive.  Na 131. Cr 1.37 (about baseline).  Total bili 1.9.  WBC 12.8.  UA negative.  CXR without acute finding.CT head with moderate cerebral volume loss and  prominence of ventricles more than expected for cerebral volume loss concerning for NPH.  CT cervical spine without acute finding.  Admission accepted by overnight admitted.   Review of Systems: As mentioned in the history of present illness. All other systems reviewed and are negative. Past Medical History:  Diagnosis Date   AF (paroxysmal atrial fibrillation) (HCC) 02/16/2018   Atrial fibrillation (HCC)    Atrial fibrillation with RVR (HCC) 02/16/2018   Bipolar 1 disorder (HCC)    Bipolar disorder (HCC) 02/17/2018   Dizziness 02/26/2020   DJD (degenerative joint disease)    Dyslipidemia 02/26/2020   Gait instability 02/16/2018   Hypertension    Mitral regurgitation    mild    Polycythemia 02/16/2018   Renal insufficiency    Stroke [facility])    Tremor 02/16/2018   Tremors of nervous system 01/2018   Past Surgical History:  Procedure Laterality Date   SHOULDER SURGERY     Social History:  reports that he has quit smoking. He has never used smokeless tobacco. He reports current alcohol use of about 1.0 standard drink of alcohol per week. He reports that he does not use drugs.  Allergies  Allergen Reactions   Lipitor [Atorvastatin] Other (See Comments)    Joint soreness    Family History  Problem Relation Age of Onset   Alcohol abuse Mother    Cancer Father     Prior to Admission medications   Medication Sig Start Date End Date Taking? Authorizing Provider  acetaminophen (TYLENOL) 500 MG tablet Take 500 mg  by mouth 2 (two) times daily.    Yes [provider]  Ascorbic Acid (VITAMIN C PO) Take 1 tablet by mouth daily.   Yes [provider]  diltiazem (CARDIZEM CD) 120 MG 24 hr capsule TAKE ONE CAPSULE BY MOUTH DAILY 03/05/22  Yes Park Liter, MD  ELIQUIS 5 MG TABS tablet TAKE 1 TABLET BY MOUTH TWICE DAILY. Patient taking differently: Take 5 mg by mouth 2 (two) times daily. 03/12/22  Yes Park Liter, MD  ibandronate (BONIVA) 150 MG tablet Take 150 mg by  mouth every 30 (thirty) days. 06/19/19  Yes [provider]  latanoprost (XALATAN) 0.005 % ophthalmic solution Place 1 drop into both eyes at bedtime. 08/19/13  Yes [provider]  lithium carbonate (LITHOBID) 300 MG ER tablet Take 1 tablet (300 mg total) by mouth every evening. 06/25/22  Yes Cottle, Billey Co., MD  Multiple Vitamin (MULTIVITAMIN WITH MINERALS) TABS tablet Take 1 tablet by mouth daily.   Yes [provider]  pravastatin (PRAVACHOL) 20 MG tablet Take 1 tablet (20 mg total) by mouth every evening. 02/26/22  Yes Park Liter, MD  testosterone cypionate (DEPOTESTOSTERONE CYPIONATE) 200 MG/ML injection Inject 200 mg into the muscle every 21 ( twenty-one) days. Every 3 weeks 07/18/19  Yes [provider]  timolol (TIMOPTIC) 0.5 % ophthalmic solution Place 1 drop into both eyes daily. 05/18/19  Yes [provider]    Physical Exam: Vitals:   07/02/22 0430 07/02/22 0615 07/02/22 0715 07/02/22 0800  BP: (!) 146/80 (!) 153/74 (!) 165/66 (!) 143/88  Pulse: 77 71 (!) 127 69  Resp: 20 16 (!) 24 (!) 25  Temp:   98.7 F (37.1 C)   TempSrc:   Oral   SpO2: 98% 94% 97% 96%  Weight:       GENERAL: No apparent distress.  Nontoxic. HEENT: MMM.  Vision and hearing grossly intact.  NECK: Supple.  No apparent JVD.  RESP:  No IWOB.  Fair aeration bilaterally. CVS:  RRR. Heart sounds normal.  ABD/GI/GU: BS+. Abd soft, NTND.  MSK/EXT:  Moves extremities.  Bruising over both knees. SKIN: no apparent skin lesion or wound NEURO: Awake and alert. Oriented to self.  Follows commands. Speech clear.  PERRL.  Motor 4+/5 in all extremities.  Patellar reflex symmetric. PSYCH: Calm. Normal affect.   Data Reviewed: See HPI  Assessment and Plan: Principal Problem:   Acute metabolic encephalopathy Active Problems:   Gait instability   Tremor   Hypertension   Atrial fibrillation, persistent (HCC)   Bipolar disorder (HCC)   Acute metabolic  encephalopathy/possible delirium: Likely due to COVID-19 infection.  Seems to have underlying cognitive impairment as well.  CT head raises concern for NPH.  He has chronic gait abnormality.  Not on nephrotoxic meds.  Lithium level slightly low. -Check UDS, TSH, ammonia and vitamin B12 level -Patient on delirium precautions. -Fall precautions. -PT/OT  Generalized weakness/Gait instability/ground-level fall: Per wife, he was diagnosed with Parkinson disease at some point and started on Parkinson medication that has affected his cognition.  Parkinson medication was stopped and his cognition improved.  No significant trauma or injury.  Per wife, did not hit his head or loss consciousness.  CT head without acute finding.  He has no focal neurosymptoms other than some tremors. -Fall precaution, PT/OT as above -Check vitamin B12  COVID-19 infection: No significant respiratory symptoms other than dry cough. -Isolation precaution -Doubt need for treatment.  Persistent A-fib: Rate controlled. -Continue  home Cardizem and Eliquis -Will discuss about risk and benefit of anticoagulation given risk of fall and unsteady gait.  CKD-3A: Stable -Monitor  Hyponatremia: Na 131.  Due to lithium? -Continue monitoring  Hyperbilirubinemia  Bipolar disorder: Followed by psychiatry.  Seems like his lithium was decreased from 450 mg to 300 mg about a week ago.  Lithium level slightly low at 0.47 -Continue home lithium at 300 mg daily for now  Hypogonadism: Seems to be on testosterone injection. -Outpatient follow-up  Glaucoma -Continue eyedrops.   Advance Care Planning:   Code Status: DNR .  Patient has DNR paper at bedside.  Consults: None  Family Communication: Updated patient's wife over the phone.  Severity of Illness: The appropriate patient status for this patient is INPATIENT. Inpatient status is judged to be reasonable and necessary in order to provide the required intensity of service to  ensure the patient's safety. The patient's presenting symptoms, physical exam findings, and initial radiographic and laboratory data in the context of their chronic comorbidities is felt to place them at high risk for further clinical deterioration. Furthermore, it is not anticipated that the patient will be medically stable for discharge from the hospital within 2 midnights of admission.   * I certify that at the point of admission it is my clinical judgment that the patient will require inpatient hospital care spanning beyond 2 midnights from the point of admission due to high intensity of service, high risk for further deterioration and high frequency of surveillance required.*  Author: Mercy Riding, MD 07/02/2022 9:13 AM  For on call review [email or web address].

## 2022-07-02 NOTE — ED Notes (Signed)
Help change patient put new sheets on bed placed a pad and a brief patient is resting with call bell in reach

## 2022-07-02 NOTE — ED Notes (Signed)
Attempted to ambulate pt. Pt had difficulty with standing and pivoting to move up into the stretcher.

## 2022-07-02 NOTE — ED Notes (Signed)
Patient readjusted in the bed; perineal care provided and patient was placed in brief. Call bell at bedside fall precaution of having the patient near the nurses station with glass window visible. Patient doesn't report understanding of call bell or fall risk education provided

## 2022-07-03 DIAGNOSIS — I5042 Chronic combined systolic (congestive) and diastolic (congestive) heart failure: Secondary | ICD-10-CM

## 2022-07-03 DIAGNOSIS — G9341 Metabolic encephalopathy: Secondary | ICD-10-CM | POA: Diagnosis not present

## 2022-07-03 DIAGNOSIS — T796XXA Traumatic ischemia of muscle, initial encounter: Secondary | ICD-10-CM | POA: Insufficient documentation

## 2022-07-03 DIAGNOSIS — R251 Tremor, unspecified: Secondary | ICD-10-CM | POA: Diagnosis not present

## 2022-07-03 DIAGNOSIS — E669 Obesity, unspecified: Secondary | ICD-10-CM | POA: Insufficient documentation

## 2022-07-03 DIAGNOSIS — F319 Bipolar disorder, unspecified: Secondary | ICD-10-CM | POA: Diagnosis not present

## 2022-07-03 LAB — COMPREHENSIVE METABOLIC PANEL
ALT: 36 U/L (ref 0–44)
AST: 137 U/L — ABNORMAL HIGH (ref 15–41)
Albumin: 3.6 g/dL (ref 3.5–5.0)
Alkaline Phosphatase: 56 U/L (ref 38–126)
Anion gap: 9 (ref 5–15)
BUN: 13 mg/dL (ref 8–23)
CO2: 23 mmol/L (ref 22–32)
Calcium: 9.6 mg/dL (ref 8.9–10.3)
Chloride: 102 mmol/L (ref 98–111)
Creatinine, Ser: 1.2 mg/dL (ref 0.61–1.24)
GFR, Estimated: 59 mL/min — ABNORMAL LOW (ref >60)
Glucose, Bld: 92 mg/dL (ref 70–99)
Potassium: 4.4 mmol/L (ref 3.5–5.1)
Sodium: 134 mmol/L — ABNORMAL LOW (ref 135–145)
Total Bilirubin: 2 mg/dL — ABNORMAL HIGH (ref 0.3–1.2)
Total Protein: 6.9 g/dL (ref 6.5–8.1)

## 2022-07-03 LAB — CK: Total CK: 3117 U/L — ABNORMAL HIGH (ref 49–397)

## 2022-07-03 LAB — CBC
HCT: 49.6 % (ref 39.0–52.0)
Hemoglobin: 16.8 g/dL (ref 13.0–17.0)
MCH: 31.3 pg (ref 26.0–34.0)
MCHC: 33.9 g/dL (ref 30.0–36.0)
MCV: 92.4 fL (ref 80.0–100.0)
Platelets: 171 10*3/uL (ref 150–400)
RBC: 5.37 MIL/uL (ref 4.22–5.81)
RDW: 13.3 % (ref 11.5–15.5)
WBC: 10.7 10*3/uL — ABNORMAL HIGH (ref 4.0–10.5)
nRBC: 0 % (ref 0.0–0.2)

## 2022-07-03 LAB — PHOSPHORUS: Phosphorus: 2.8 mg/dL (ref 2.5–4.6)

## 2022-07-03 LAB — MAGNESIUM: Magnesium: 1.9 mg/dL (ref 1.7–2.4)

## 2022-07-03 MED ORDER — SODIUM CHLORIDE 0.9 % IV SOLN: INTRAVENOUS | Status: DC

## 2022-07-03 NOTE — Progress Notes (Signed)
   07/03/22 1405  Spiritual Encounters  Type of Visit Initial  Care provided to: Patient  Referral source Nurse (RN/NT/LPN)  Reason for visit Routine spiritual support  OnCall Visit No   Chaplain responded to a spiritual consult for prayer. 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 A passion that he engaged with for many years. 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 He and his wife have had a very full life and supported one another through many challenges.We prayed for their health and the work they have done may it continue to produce in the years to come.   [facility]  [phone]

## 2022-07-03 NOTE — Progress Notes (Signed)
PROGRESS NOTE  Sean Bishop RFX:[id number] DOB: 07/30/1932   PCP: Faustino Congress, NP  [facility] is from: [facility]  DOA: 07/01/2022 LOS: 1  Chief complaints Chief Complaint  Patient presents with   Altered Mental Status     Brief Narrative / Interim history: 89 y.o. male with cognitive impairment, CVA, tremors, bipolar disorder, paroxysmal A-fib, dCM/systolic CHF, GERD and YME-1R brought to ED by EMS due to confusion, ground-level fall and generalized weakness, and admitted for acute metabolic encephalopathy in the setting of COVID-19 infection, and rhabdomyolysis.  CT head raises concern for NPH.  Otherwise, encephalopathy workup unrevealing.   Encephalopathy seems to be improving.  He is more awake and alert but only oriented to self.  Therapy recommended SNF.  Remains on IV fluid for rhabdomyolysis.  Subjective: Seen and examined earlier this morning.  No major events overnight of this morning.  No complaints but not a great historian.  He is awake and alert but only oriented to self.  He follows command.  Responds no to pain, shortness of breath, nausea or vomiting.  Objective: Vitals:   07/03/22 0359 07/03/22 0849 07/03/22 1205 07/03/22 1609  BP: (!) 126/109 (!) 155/73 (!) 143/64 (!) 151/82  Pulse: 74 78 83 77  Resp: 18 18 19 19   Temp: 99.1 F (37.3 C) 98.6 F (37 C) 99.7 F (37.6 C) 100.2 F (37.9 C)  TempSrc: Axillary Axillary Oral Axillary  SpO2: 97% 100% 96% 90%  Weight:      Height:        Examination:  GENERAL: No apparent distress.  Nontoxic. HEENT: MMM.  Vision and hearing grossly intact.  NECK: Supple.  No apparent JVD.  RESP:  No IWOB.  Fair aeration bilaterally. CVS:  RRR. Heart sounds normal.  ABD/GI/GU: BS+. Abd soft, NTND.  MSK/EXT:  Moves extremities. No apparent deformity. No edema.  SKIN: no apparent skin lesion or wound NEURO: Awake and alert but only oriented to self.  Follows commands.  No apparent focal neuro deficit. PSYCH: Calm.  Normal affect.   Procedures:  None  Microbiology summarized: AXENM-07, influenza and RSV PCR nonreactive.  Assessment and plan: Principal Problem:   Acute metabolic encephalopathy Active Problems:   Gait instability   Tremor   Hypertension   Atrial fibrillation, persistent (HCC)   Bipolar disorder (HCC)   Hyponatremia   Hyperbilirubinemia   Traumatic rhabdomyolysis (HCC)   Obesity (BMI 30-39.9)  Acute metabolic encephalopathy/possible delirium: Likely due to COVID-19 infection.  Seems to have underlying cognitive impairment as well.  CT head raises concern for NPH.  Ammonia, UDS, TSH and B12 within normal.  He has chronic gait abnormality.  Not on nephrotoxic meds.  Lithium level slightly low.  No focal deficit on exam.  He is more awake and alert but only oriented to self. -Reorientation and delirium precautions. -Avoid sedating medications   Generalized weakness/Gait instability/ground-level fall: Per wife, he was diagnosed with Parkinson disease at some point and started on Parkinson medication that has affected his cognition.  Parkinson medication was stopped and his cognition improved.  No significant trauma or injury.  Per wife, did not hit his head or loss consciousness.  CT head without acute finding.  He has no focal neurosymptoms other than some tremors. -Fall precaution,  -PT/OT recommended SNF.  Traumatic rhabdomyolysis: Mild.  CK trended from 1700-3000. -Discontinue statin -Start IV fluid cautiously   COVID-19 infection: No significant respiratory symptoms other than dry cough. -Isolation precaution -Doubt need for treatment.  Chronic combined  CHF: TTE in 11/2021 with LVEF of 45 to 50%, indeterminate DD.  Not on diuretics.  Appears euvolemic. -Closely monitor fluid and respiratory status while on IV fluid   Persistent A-fib: Rate controlled. -Continue home Cardizem and Eliquis -Will discuss about risk and benefit of anticoagulation given risk of fall and  unsteady gait.   CKD-3A: Stable -Monitor   Hyponatremia: Na 131.  Due to lithium?  Improving. -Continue monitoring   Hyperbilirubinemia: Mild. -Continue monitoring   Bipolar disorder: Followed by psychiatry.  Seems like his lithium was decreased from 450 mg to 300 mg about a week ago.  Lithium level slightly low at 0.47 -Continue home lithium at 300 mg daily for now   Hypogonadism: Seems to be on testosterone injection. -Outpatient follow-up   Glaucoma -Continue eyedrops  Obesity Body mass index is 34.66 kg/m.           DVT prophylaxis:   apixaban (ELIQUIS) tablet 5 mg  Code Status: DNR/DNI Family Communication: None at bedside Level of care: Telemetry Medical Status is: Inpatient Remains inpatient appropriate because: Acute encephalopathy, rhabdomyolysis and generalized weakness   Final disposition: SNF Consultants:  None  Sch Meds:  Scheduled Meds:  apixaban  5 mg Oral BID   ascorbic acid  500 mg Oral BID   diltiazem  120 mg Oral Daily   latanoprost  1 drop Both Eyes QHS   lithium carbonate  300 mg Oral QPM   multivitamin with minerals  1 tablet Oral Daily   timolol  1 drop Both Eyes Daily   Continuous Infusions:  sodium chloride 100 mL/hr at 07/03/22 1235   PRN Meds:.acetaminophen **OR** acetaminophen, ondansetron **OR** ondansetron (ZOFRAN) IV  Antimicrobials: Anti-infectives (From admission, onward)    None        I have personally reviewed the following labs and images: CBC: Recent Labs  Lab 07/01/22 2346 07/03/22 0816  WBC 12.8* 10.7*  NEUTROABS 11.2*  --   HGB 15.7 16.8  HCT 49.0 49.6  MCV 96.5 92.4  PLT 207 171   BMP &GFR Recent Labs  Lab 07/01/22 2346 07/03/22 0816  NA 131* 134*  K 4.2 4.4  CL 101 102  CO2 22 23  GLUCOSE 119* 92  BUN 15 13  CREATININE 1.37* 1.20  CALCIUM 9.8 9.6  MG  --  1.9  PHOS  --  2.8   Estimated Creatinine Clearance: 47.4 mL/min (by C-G formula based on SCr of 1.2 mg/dL). Liver &  Pancreas: Recent Labs  Lab 07/01/22 2346 07/03/22 0816  AST 37 137*  ALT 23 36  ALKPHOS 68 56  BILITOT 1.9* 2.0*  PROT 7.0 6.9  ALBUMIN 3.9 3.6   No results for input(s): "LIPASE", "AMYLASE" in the last 168 hours. Recent Labs  Lab 07/02/22 1132  AMMONIA 22   Diabetic: No results for input(s): "HGBA1C" in the last 72 hours. No results for input(s): "GLUCAP" in the last 168 hours. Cardiac Enzymes: Recent Labs  Lab 07/02/22 1132 07/03/22 0816  CKTOTAL 1,774* 3,117*   No results for input(s): "PROBNP" in the last 8760 hours. Coagulation Profile: No results for input(s): "INR", "PROTIME" in the last 168 hours. Thyroid Function Tests: Recent Labs    07/02/22 1132  TSH 0.953   Lipid Profile: No results for input(s): "CHOL", "HDL", "LDLCALC", "TRIG", "CHOLHDL", "LDLDIRECT" in the last 72 hours. Anemia Panel: Recent Labs    07/02/22 1132  VITAMINB12 278   Urine analysis:    Component Value Date/Time   COLORURINE YELLOW 07/02/2022  0016   APPEARANCEUR CLEAR 07/02/2022 0016   LABSPEC 1.009 07/02/2022 0016   PHURINE 6.0 07/02/2022 0016   GLUCOSEU NEGATIVE 07/02/2022 0016   HGBUR NEGATIVE 07/02/2022 0016   BILIRUBINUR NEGATIVE 07/02/2022 0016   KETONESUR 5 (A) 07/02/2022 0016   PROTEINUR 30 (A) 07/02/2022 0016   UROBILINOGEN 1.0 01/21/2009 0005   NITRITE NEGATIVE 07/02/2022 0016   LEUKOCYTESUR NEGATIVE 07/02/2022 0016   Sepsis Labs: Invalid input(s): "PROCALCITONIN", "LACTICIDVEN"  Microbiology: Recent Results (from the past 240 hour(s))  Resp panel by RT-PCR (RSV, Flu A&B, Covid) Anterior Nasal Swab     Status: Abnormal   Collection Time: 07/02/22  4:00 AM   Specimen: Anterior Nasal Swab  Result Value Ref Range Status   SARS Coronavirus 2 by RT PCR POSITIVE (A) NEGATIVE Final    Comment: (NOTE) SARS-CoV-2 target nucleic acids are DETECTED.  The SARS-CoV-2 RNA is generally detectable in upper respiratory specimens during the acute phase of infection.  Positive results are indicative of the presence of the identified virus, but do not rule out bacterial infection or co-infection with other pathogens not detected by the test. Clinical correlation with patient history and other diagnostic information is necessary to determine patient infection status. The expected result is Negative.  Fact Sheet for Patients: BloggerCourse.com  Fact Sheet for Healthcare Providers: SeriousBroker.it  This test is not yet approved or cleared by the [place] FDA and  has been authorized for detection and/or diagnosis of SARS-CoV-2 by FDA under an Emergency Use Authorization (EUA).  This EUA will remain in effect (meaning this test can be used) for the duration of  the COVID-19 declaration under Section 564(b)(1) of the A ct, 21 U.S.C. section 360bbb-3(b)(1), unless the authorization is terminated or revoked sooner.     Influenza A by PCR NEGATIVE NEGATIVE Final   Influenza B by PCR NEGATIVE NEGATIVE Final    Comment: (NOTE) The Xpert Xpress SARS-CoV-2/FLU/RSV plus assay is intended as an aid in the diagnosis of influenza from Nasopharyngeal swab specimens and should not be used as a sole basis for treatment. Nasal washings and aspirates are unacceptable for Xpert Xpress SARS-CoV-2/FLU/RSV testing.  Fact Sheet for Patients: BloggerCourse.com  Fact Sheet for Healthcare Providers: SeriousBroker.it  This test is not yet approved or cleared by the [place] FDA and has been authorized for detection and/or diagnosis of SARS-CoV-2 by FDA under an Emergency Use Authorization (EUA). This EUA will remain in effect (meaning this test can be used) for the duration of the COVID-19 declaration under Section 564(b)(1) of the Act, 21 U.S.C. section 360bbb-3(b)(1), unless the authorization is terminated or revoked.     Resp Syncytial Virus by PCR  NEGATIVE NEGATIVE Final    Comment: (NOTE) Fact Sheet for Patients: BloggerCourse.com  Fact Sheet for Healthcare Providers: SeriousBroker.it  This test is not yet approved or cleared by the [place] FDA and has been authorized for detection and/or diagnosis of SARS-CoV-2 by FDA under an Emergency Use Authorization (EUA). This EUA will remain in effect (meaning this test can be used) for the duration of the COVID-19 declaration under Section 564(b)(1) of the Act, 21 U.S.C. section 360bbb-3(b)(1), unless the authorization is terminated or revoked.  Performed at [facility], [address], [place], [place] [address]     Radiology Studies: No results found.    Quentina Fronek T. Eliel Dudding [facility]  If 7PM-7AM, please contact night-coverage [email or web address] 07/03/2022, 4:47 PM

## 2022-07-03 NOTE — Evaluation (Signed)
Occupational Therapy Evaluation Patient Details Name: Sean Bishop MRN: [id number] DOB: 06-Oct-1932 Today's Date: 07/03/2022   History of Present Illness Pt presenting via EMS on 1/3 with witnessed fall in which wife reports he did not hit his head, however, was unable to comprehend and answer questions. PMH significant for A-fib on Eliquis, bipolar disorder on lithium, hypertension, CVA, CHF, GERD, CKD-3A.   Clinical Impression   Pt with difficulty providing prior history regarding ADL, but per chart 1 year ago, was living with his wife in independent living. Upon eval, pt presents with confusion, weakness, decreased balance, strength, cognition, and activity tolerance. Pt confused and anxious throughout session with fear of falling and reporting "I don't know who I am" on one occasion. Pt performing bed mobility with mod A and step by step cues due to poor sequencing. Pt performing transfers with min A +2 and taking steps with mod A and step by step cues this session. Pt with dizziness on standing and with elevated BP (see general comments). Further mobility deferred this session for pt safety. Recommending continued OT at SNF to optimize safety and independence in ADL.      Recommendations for follow up therapy are one component of a multi-disciplinary discharge planning process, led by the attending physician.  Recommendations may be updated based on patient status, additional functional criteria and insurance authorization.   Follow Up Recommendations  Skilled nursing-short term rehab (<3 hours/day)     Assistance Recommended at Discharge Intermittent Supervision/Assistance  Patient can return home with the following A lot of help with walking and/or transfers;A lot of help with bathing/dressing/bathroom;Assistance with cooking/housework;Direct supervision/assist for medications management;Direct supervision/assist for financial management;Assist for transportation;Help with stairs or ramp  for entrance;Assistance with feeding    Functional Status Assessment  Patient has had a recent decline in their functional status and demonstrates the ability to make significant improvements in function in a reasonable and predictable amount of time.  Equipment Recommendations  Other (comment) (defer to next venue)    Recommendations for Other Services       Precautions / Restrictions Precautions Precautions: Fall Precaution Comments: watch BP Restrictions Weight Bearing Restrictions: No      Mobility Bed Mobility Overal bed mobility: Needs Assistance Bed Mobility: Supine to Sit, Sit to Supine     Supine to sit: Mod assist Sit to supine: Min assist   General bed mobility comments: Mod A to elevate trunk, but mod A to achieve feet on floor. Min A to bring BLE back into bed    Transfers Overall transfer level: Needs assistance Equipment used: Rolling walker (2 wheels) Transfers: Sit to/from Stand Sit to Stand: Min assist, +2 physical assistance           General transfer comment: cues for hand placement and safety. Needing physical asist for hand placment on RW.      Balance                                           ADL either performed or assessed with clinical judgement   ADL Overall ADL's : Needs assistance/impaired Eating/Feeding: Minimal assistance;Cueing for sequencing   Grooming: Wash/dry face;Min guard;Sitting   Upper Body Bathing: Minimal assistance;Sitting   Lower Body Bathing: Moderate assistance;Sit to/from stand   Upper Body Dressing : Minimal assistance;Sitting   Lower Body Dressing: Total assistance;Sitting/lateral leans Lower Body Dressing Details (indicate cue  type and reason): to don socks Toilet Transfer: +2 for safety/equipment;+2 for physical assistance;Moderate assistance;Stand-pivot;Rolling walker (2 wheels) Toilet Transfer Details (indicate cue type and reason): side steos toward [facility] this session          Functional mobility during ADLs: Moderate assistance;+2 for physical assistance;+2 for safety/equipment General ADL Comments: Mod A for side steps toward HOB. Pt anxious throughout session during movement. With difficulty coordinating typical movements such as bed mobility and scooting forward toward EOB     Vision Patient Visual Report:  (Pt unable to report) Additional Comments: WFL for tasks assessed, intermittently making eye contact. Denies double or blurry vision     Perception     Praxis      Pertinent Vitals/Pain Pain Assessment Pain Assessment: No/denies pain     Hand Dominance     Extremity/Trunk Assessment Upper Extremity Assessment Upper Extremity Assessment: Generalized weakness   Lower Extremity Assessment Lower Extremity Assessment: Defer to PT evaluation   Cervical / Trunk Assessment Cervical / Trunk Assessment:  (forward rounding of shoulders)   Communication Communication Communication: Expressive difficulties;Receptive difficulties   Cognition Arousal/Alertness: Awake/alert Behavior During Therapy: Anxious Overall Cognitive Status: No family/caregiver present to determine baseline cognitive functioning                                 General Comments: Pt very confused, not oriented, with difficulty answering home questions. Unsure who he lives with but reports he has a wife. Pt very pleasant to work with but anxious with movement. Following one step commands with up to mod cues. Very fearful of falling, difficulty sequencing typical movements such as bed mobility     General Comments  155/138 (146) HR 112 SpO2 96% after standing R arm; 173/150 (160) HR 77 ~2 minutes after standing L arm    Exercises     Shoulder Instructions      Home Living Family/patient expects to be discharged to:: Private residence Living Arrangements: Spouse/significant other ([facility] [place]) Available Help at Discharge: Available 24 hours/day Type of Home:  Apartment Home Access: Level entry     Home Layout: One level     Bathroom Shower/Tub: Occupational psychologist: Standard     Home Equipment: Other (comment) (Per chart, has walking stick)          Prior Functioning/Environment Prior Level of Function : Patient poor historian/Family not available             Mobility Comments: information taken from previous [facility]. Pt states that he has a spouse but has seen in her 20 days which he states is a "wild guess"          OT Problem List: Decreased strength;Decreased activity tolerance;Impaired balance (sitting and/or standing);Decreased cognition;Decreased coordination;Decreased safety awareness;Decreased knowledge of use of DME or AE      OT Treatment/Interventions: Self-care/ADL training;Therapeutic exercise;DME and/or AE instruction;Therapeutic activities;Patient/family education;Balance training    OT Goals(Current goals can be found in the care plan section) Acute Rehab OT Goals Patient Stated Goal: to not be confused OT Goal Formulation: With patient Time For Goal Achievement: 07/17/22 Potential to Achieve Goals: Good  OT Frequency: Min 2X/week    Co-evaluation   Reason for Co-Treatment: To address functional/ADL transfers;Necessary to address cognition/behavior during functional activity;Complexity of the patient's impairments (multi-system involvement);For patient/therapist safety PT goals addressed during session: Mobility/safety with mobility;Balance;Proper use of  DME        AM-PAC OT "6 Clicks" Daily Activity     Outcome Measure Help from another person eating meals?: A Little Help from another person taking care of personal grooming?: A Little Help from another person toileting, which includes using toliet, bedpan, or urinal?: A Lot Help from another person bathing (including washing, rinsing, drying)?: A Lot Help from another person to put on and  taking off regular upper body clothing?: A Little Help from another person to put on and taking off regular lower body clothing?: Total 6 Click Score: 14   End of Session Equipment Utilized During Treatment: Gait belt;Rolling walker (2 wheels) Nurse Communication: Mobility status  Activity Tolerance: Patient tolerated treatment well;Other (comment) (elevated BP so deferred further mobility) Patient left: in bed;with call bell/phone within reach;with bed alarm set  OT Visit Diagnosis: Unsteadiness on feet (R26.81);Muscle weakness (generalized) (M62.81);Other abnormalities of gait and mobility (R26.89);History of falling (Z91.81);Other symptoms and signs involving cognitive function;Dizziness and giddiness (R42)                Time: 3546-5681 OT Time Calculation (min): 36 min Charges:  OT General Charges $OT Visit: 1 Visit OT Evaluation $OT Eval Moderate Complexity: 1 Mod  Tyler Deis, OTR/L [facility] Acute Rehabilitation Office: [phone]   Myrla Halsted 07/03/2022, 3:36 PM

## 2022-07-03 NOTE — Evaluation (Signed)
Physical Therapy Evaluation Patient Details Name: Sean Bishop MRN: [id number] DOB: 06-08-1933 Today's Date: 07/03/2022  History of Present Illness  Pt presenting via EMS on 1/3 with witnessed fall in which wife reports he did not hit his head, however, was unable to comprehend and answer questions. PMH significant for A-fib on Eliquis, bipolar disorder on lithium, hypertension, CVA, CHF, GERD, CKD-3A.  Clinical Impression  Pt  is presenting below baseline of functioning. Difficult to determine at this time due to no family present. Attempted to call spouse but no answer. Pt per chart was living in ILF prior to hospitalization. Currently pt requires physical assistance for bed mobility, sit to stand and a few side steps with gait demonstrating significant difficulty with coordinating movements with AD and maintaining balance for out of bed activities. Due to current functional status, previous level of function, home set up and available assistance recommending skilled physical therapy services in SNF setting on discharge from acute care hospital in order to decrease risk for falls, injury, immobility and re-hospitalization. Pt BP had very close diastolic and systolic limiting OOB activities today.      Recommendations for follow up therapy are one component of a multi-disciplinary discharge planning process, led by the attending physician.  Recommendations may be updated based on patient status, additional functional criteria and insurance authorization.  Follow Up Recommendations Skilled nursing-short term rehab (<3 hours/day) Can patient physically be transported by private vehicle: No    Assistance Recommended at Discharge Frequent or constant Supervision/Assistance  Patient can return home with the following  A lot of help with walking and/or transfers;Help with stairs or ramp for entrance;Assistance with cooking/housework;Assist for transportation    Equipment Recommendations Rolling  walker (2 wheels);Other (comment) (defer to post acute)  Recommendations for Other Services       Functional Status Assessment Patient has had a recent decline in their functional status and demonstrates the ability to make significant improvements in function in a reasonable and predictable amount of time.     Precautions / Restrictions Precautions Precautions: Fall Precaution Comments: watch BP Restrictions Weight Bearing Restrictions: No      Mobility  Bed Mobility Overal bed mobility: Needs Assistance Bed Mobility: Supine to Sit, Sit to Supine     Supine to sit: Mod assist Sit to supine: Min assist   General bed mobility comments: Difficulty coordinating movement in order to decrease level of physical assist required. Patient Response: Cooperative  Transfers Overall transfer level: Needs assistance Equipment used: Rolling walker (2 wheels) Transfers: Sit to/from Stand Sit to Stand: Min assist           General transfer comment: verbal cues for sequencing and safe hand placement. Pt was very unstable on standing with two person assist stabilizing pt and AD.    Ambulation/Gait Ambulation/Gait assistance: +2 physical assistance Gait Distance (Feet): 1 Feet Assistive device: Rolling walker (2 wheels) Gait Pattern/deviations: Wide base of support, Leaning posteriorly   Gait velocity interpretation: <1.31 ft/sec, indicative of household ambulator   General Gait Details: low foot clearance, moderate assist for wgt shifting, keeping wgt forward, progressing feet sequentially.  Stairs Stairs:  (not applicable pt does not have stairs)            Modified Rankin (Stroke Patients Only) Modified Rankin (Stroke Patients Only) Pre-Morbid Rankin Score: No significant disability Modified Rankin: Severe disability     Balance Overall balance assessment: Needs assistance Sitting-balance support: Single extremity supported, Bilateral upper extremity  supported Sitting balance-Leahy Scale: Poor  Sitting balance - Comments: pt sitting balance is between fair and poor. He requires UE support when sitting. Initially requires forward support to prevent falling posteriorly on sitting up in bed. Postural control: Posterior lean Standing balance support: Bilateral upper extremity supported Standing balance-Leahy Scale: Poor Standing balance comment: Pt has difficulty with dynamic standing balance he requires moderate assist to maintain forward COM over BOS when standing.                             Pertinent Vitals/Pain Pain Assessment Pain Assessment: No/denies pain (pt made grunting sounds throughout but no yelling out. Continued to ask if there was pain and pt would say no)    Home Living Family/patient expects to be discharged to:: Private residence Living Arrangements: Spouse/significant other ([facility] [place]) Available Help at Discharge: Available 24 hours/day Type of Home: Apartment Home Access: Level entry       Home Layout: One level Home Equipment: Other (comment) (Per chart, has walking stick)      Prior Function Prior Level of Function : Patient poor historian/Family not available             Mobility Comments: information taken from previous [facility]. Pt states that he has a spouse but has seen in her 20 days which he states is a "wild guess"       Hand Dominance        Extremity/Trunk Assessment   Upper Extremity Assessment Upper Extremity Assessment: Generalized weakness    Lower Extremity Assessment Lower Extremity Assessment: Defer to PT evaluation    Cervical / Trunk Assessment Cervical / Trunk Assessment:  (forward rounding of shoulders)  Communication   Communication: Expressive difficulties;Receptive difficulties  Cognition Arousal/Alertness: Awake/alert Behavior During Therapy:  (pt made small grunting noises throughout session.) Overall  Cognitive Status: Difficult to assess                   General Comments: Pt was unsure of where he was or why he was there. He was unclear when he had seen his spouse and stated "I don't know who I am."        General Comments General comments (skin integrity, edema, etc.): 155/138 (146) Hr 112 Spo2 96% after standing R arm: 173/150 (160) HR 77 ~ 2 minutes after standing L arm    Exercises     Assessment/Plan    PT Assessment Patient needs continued PT services  PT Problem List Decreased strength;Decreased balance;Decreased mobility;Decreased knowledge of use of DME;Decreased safety awareness;Decreased activity tolerance       PT Treatment Interventions      PT Goals (Current goals can be found in the Care Plan section)  Acute Rehab PT Goals Patient Stated Goal: Pt was not able to participate in goal setting. PT Goal Formulation: All assessment and education complete, DC therapy Time For Goal Achievement: 07/17/22 Potential to Achieve Goals: Fair    Frequency Min 3X/week     Co-evaluation PT/OT/SLP Co-Evaluation/Treatment: Yes Reason for Co-Treatment: To address functional/ADL transfers;Necessary to address cognition/behavior during functional activity;Complexity of the patient's impairments (multi-system involvement);For patient/therapist safety PT goals addressed during session: Mobility/safety with mobility;Balance;Proper use of DME         AM-PAC PT "6 Clicks" Mobility  Outcome Measure Help needed turning from your back to your side while in a flat bed without using bedrails?: A Lot Help needed moving from lying on  your back to sitting on the side of a flat bed without using bedrails?: A Lot Help needed moving to and from a bed to a chair (including a wheelchair)?: A Lot Help needed standing up from a chair using your arms (e.g., wheelchair or bedside chair)?: A Lot Help needed to walk in hospital room?: A Lot Help needed climbing 3-5 steps with a railing?  : Total 6 Click Score: 11    End of Session Equipment Utilized During Treatment: Gait belt Activity Tolerance: Other (comment) (did not continue with out of bed mobility due to blood pressures) Patient left: in bed;with call bell/phone within reach;with bed alarm set Nurse Communication: Mobility status PT Visit Diagnosis: Other abnormalities of gait and mobility (R26.89);Unsteadiness on feet (R26.81)    Time: 4403-4742 PT Time Calculation (min) (ACUTE ONLY): 36 min   Charges:   PT Evaluation $PT Eval Moderate Complexity: [place], DPT, CLT  Acute Rehabilitation Services Office: [phone] (Secure chat preferred)   Ander Purpura 07/03/2022, 3:51 PM

## 2022-07-04 DIAGNOSIS — G9341 Metabolic encephalopathy: Secondary | ICD-10-CM | POA: Diagnosis not present

## 2022-07-04 DIAGNOSIS — F319 Bipolar disorder, unspecified: Secondary | ICD-10-CM | POA: Diagnosis not present

## 2022-07-04 DIAGNOSIS — R251 Tremor, unspecified: Secondary | ICD-10-CM | POA: Diagnosis not present

## 2022-07-04 DIAGNOSIS — T796XXA Traumatic ischemia of muscle, initial encounter: Secondary | ICD-10-CM | POA: Diagnosis not present

## 2022-07-04 LAB — COMPREHENSIVE METABOLIC PANEL
ALT: 38 U/L (ref 0–44)
AST: 105 U/L — ABNORMAL HIGH (ref 15–41)
Albumin: 3.3 g/dL — ABNORMAL LOW (ref 3.5–5.0)
Alkaline Phosphatase: 55 U/L (ref 38–126)
Anion gap: 8 (ref 5–15)
BUN: 15 mg/dL (ref 8–23)
CO2: 21 mmol/L — ABNORMAL LOW (ref 22–32)
Calcium: 9.1 mg/dL (ref 8.9–10.3)
Chloride: 106 mmol/L (ref 98–111)
Creatinine, Ser: 1.14 mg/dL (ref 0.61–1.24)
GFR, Estimated: >60 mL/min (ref >60)
Glucose, Bld: 113 mg/dL — ABNORMAL HIGH (ref 70–99)
Potassium: 4.1 mmol/L (ref 3.5–5.1)
Sodium: 135 mmol/L (ref 135–145)
Total Bilirubin: 1.4 mg/dL — ABNORMAL HIGH (ref 0.3–1.2)
Total Protein: 6.5 g/dL (ref 6.5–8.1)

## 2022-07-04 LAB — CBC
HCT: 46.8 % (ref 39.0–52.0)
Hemoglobin: 15.9 g/dL (ref 13.0–17.0)
MCH: 31.2 pg (ref 26.0–34.0)
MCHC: 34 g/dL (ref 30.0–36.0)
MCV: 91.9 fL (ref 80.0–100.0)
Platelets: 187 10*3/uL (ref 150–400)
RBC: 5.09 MIL/uL (ref 4.22–5.81)
RDW: 13.3 % (ref 11.5–15.5)
WBC: 8.9 10*3/uL (ref 4.0–10.5)
nRBC: 0 % (ref 0.0–0.2)

## 2022-07-04 LAB — CK: Total CK: 653 U/L — ABNORMAL HIGH (ref 49–397)

## 2022-07-04 LAB — MAGNESIUM: Magnesium: 1.9 mg/dL (ref 1.7–2.4)

## 2022-07-04 NOTE — Progress Notes (Signed)
PROGRESS NOTE  Sean Bishop CHY:[id number] DOB: 09-04-1932   PCP: Moshe Cipro, NP  Patient is from: [facility].  DOA: 07/01/2022 LOS: 2  Chief complaints Chief Complaint  Patient presents with   Altered Mental Status     Brief Narrative / Interim history: 89 y.o. male with cognitive impairment, CVA, tremors, bipolar disorder, paroxysmal A-fib, dCM/systolic CHF, GERD and CKD-3A brought to ED by EMS due to confusion, ground-level fall and generalized weakness, and admitted for acute metabolic encephalopathy in the setting of COVID-19 infection, and rhabdomyolysis.  CT head raises concern for NPH.  Otherwise, encephalopathy workup unrevealing.   Encephalopathy improved.  He is oriented to self, place and person but not time.  Therapy recommended SNF.  Remains on IV fluid for rhabdomyolysis.  Subjective: Seen and examined earlier this morning.  No major events overnight of this morning.  No complaints other than some cough.  Denies chest pain or shortness of breath.  Denies nausea, vomiting or abdominal pain.  He is awake and alert and oriented to self, place and person but not time.  Objective: Vitals:   07/04/22 0438 07/04/22 0500 07/04/22 0815 07/04/22 1110  BP: (!) 139/125  (!) 150/93 (!) 158/94  Pulse: 68  91 88  Resp: 18  19 19   Temp: 98.2 F (36.8 C)  98.2 F (36.8 C) 99 F (37.2 C)  TempSrc: Axillary  Oral Oral  SpO2: 96%  95% 97%  Weight:  97.2 kg    Height:        Examination:   GENERAL: No apparent distress.  Nontoxic. HEENT: MMM.  Vision and hearing grossly intact.  NECK: Supple.  No apparent JVD.  RESP:  No IWOB.  Fair aeration bilaterally. CVS:  RRR. Heart sounds normal.  ABD/GI/GU: BS+. Abd soft, NTND.  MSK/EXT:  Moves extremities. No apparent deformity. No edema.  SKIN: no apparent skin lesion or wound NEURO: Awake and alert. Oriented to self, place and person but not time.  Follows commands.  No apparent focal neuro deficit. PSYCH: Calm.  Normal affect.   Procedures:  None  Microbiology summarized: COVID-19, influenza and RSV PCR nonreactive.  Assessment and plan: Principal Problem:   Acute metabolic encephalopathy Active Problems:   Gait instability   Tremor   Hypertension   Atrial fibrillation, persistent (HCC)   Bipolar disorder (HCC)   Hyponatremia   Hyperbilirubinemia   Traumatic rhabdomyolysis (HCC)   Obesity (BMI 30-39.9)  Acute metabolic encephalopathy/possible delirium: Likely due to COVID-19 infection.  Seems to have underlying cognitive impairment as well.  CT head raises concern for NPH.  Ammonia, UDS, TSH and B12 within normal.  He has chronic gait abnormality.  Not on nephrotoxic meds.  Lithium level slightly low.  No focal deficit on exam.  Cephalopathy seems to have resolved.  He is oriented to self, place and person but not time. -Reorientation and delirium precautions. -Avoid sedating medications   Generalized weakness/Gait instability/ground-level fall: Per wife, he was diagnosed with Parkinson disease at some point and started on Parkinson medication that has affected his cognition.  Parkinson medication was stopped and his cognition improved.  No significant trauma or injury.  Per wife, did not hit his head or loss consciousness.  CT head without acute finding.  He has no focal neurosymptoms other than some tremors. -Fall precaution,  -PT/OT recommended SNF.  Traumatic rhabdomyolysis: Mild.  CK trended from 1700>>3000.  Morning labs pending. -Continue holding statin. -Continue IV fluid cautiously.   COVID-19 infection: No significant respiratory  symptoms other than some cough. -Continue isolation precaution -Doubt need for treatment.  Chronic combined CHF: TTE in 11/2021 with LVEF of 45 to 50%, indeterminate DD.  Not on diuretics.  Appears euvolemic. -Closely monitor fluid and respiratory status while on IV fluid   Persistent A-fib: Rate controlled. -Continue home Cardizem and Eliquis    CKD-3A: Stable -Monitor   Hyponatremia: Na 131.  Due to lithium?  Improving. -Continue monitoring   Hyperbilirubinemia: Mild and stable. -Continue monitoring   Bipolar disorder: Followed by psychiatry.  Seems like his lithium was decreased from 450 mg to 300 mg about a week ago.  Lithium level slightly low at 0.47 -Continue home lithium at 300 mg daily for now   Hypogonadism: Seems to be on testosterone injection. -Outpatient follow-up   Glaucoma -Continue eyedrops  Obesity Body mass index is 34.59 kg/m.           DVT prophylaxis:   apixaban (ELIQUIS) tablet 5 mg  Code Status: DNR/DNI Family Communication: Attempted to call patient's wife and son for update but no answer.  Did not leave voicemail Level of care: Telemetry Medical Status is: Inpatient Remains inpatient appropriate because: Acute encephalopathy, rhabdomyolysis and generalized weakness   Final disposition: SNF Consultants:  None  Sch Meds:  Scheduled Meds:  apixaban  5 mg Oral BID   ascorbic acid  500 mg Oral BID   diltiazem  120 mg Oral Daily   latanoprost  1 drop Both Eyes QHS   lithium carbonate  300 mg Oral QPM   multivitamin with minerals  1 tablet Oral Daily   timolol  1 drop Both Eyes Daily   Continuous Infusions:  sodium chloride 100 mL/hr at 07/04/22 0923   PRN Meds:.acetaminophen **OR** acetaminophen, ondansetron **OR** ondansetron (ZOFRAN) IV  Antimicrobials: Anti-infectives (From admission, onward)    None        I have personally reviewed the following labs and images: CBC: Recent Labs  Lab 07/01/22 2346 07/03/22 0816 07/04/22 1101  WBC 12.8* 10.7* 8.9  NEUTROABS 11.2*  --   --   HGB 15.7 16.8 15.9  HCT 49.0 49.6 46.8  MCV 96.5 92.4 91.9  PLT 207 171 187   BMP &GFR Recent Labs  Lab 07/01/22 2346 07/03/22 0816 07/04/22 1101  NA 131* 134* 135  K 4.2 4.4 4.1  CL 101 102 106  CO2 22 23 21*  GLUCOSE 119* 92 113*  BUN 15 13 15   CREATININE 1.37* 1.20 1.14   CALCIUM 9.8 9.6 9.1  MG  --  1.9 1.9  PHOS  --  2.8  --    Estimated Creatinine Clearance: 49.8 mL/min (by C-G formula based on SCr of 1.14 mg/dL). Liver & Pancreas: Recent Labs  Lab 07/01/22 2346 07/03/22 0816 07/04/22 1101  AST 37 137* 105*  ALT 23 36 38  ALKPHOS 68 56 55  BILITOT 1.9* 2.0* 1.4*  PROT 7.0 6.9 6.5  ALBUMIN 3.9 3.6 3.3*   No results for input(s): "LIPASE", "AMYLASE" in the last 168 hours. Recent Labs  Lab 07/02/22 1132  AMMONIA 22   Diabetic: No results for input(s): "HGBA1C" in the last 72 hours. No results for input(s): "GLUCAP" in the last 168 hours. Cardiac Enzymes: Recent Labs  Lab 07/02/22 1132 07/03/22 0816 07/04/22 1101  CKTOTAL 1,774* 3,117* 653*   No results for input(s): "PROBNP" in the last 8760 hours. Coagulation Profile: No results for input(s): "INR", "PROTIME" in the last 168 hours. Thyroid Function Tests: Recent Labs  07/02/22 1132  TSH 0.953   Lipid Profile: No results for input(s): "CHOL", "HDL", "LDLCALC", "TRIG", "CHOLHDL", "LDLDIRECT" in the last 72 hours. Anemia Panel: Recent Labs    07/02/22 1132  VITAMINB12 278   Urine analysis:    Component Value Date/Time   COLORURINE YELLOW 07/02/2022 0016   APPEARANCEUR CLEAR 07/02/2022 0016   LABSPEC 1.009 07/02/2022 0016   PHURINE 6.0 07/02/2022 0016   GLUCOSEU NEGATIVE 07/02/2022 0016   HGBUR NEGATIVE 07/02/2022 0016   BILIRUBINUR NEGATIVE 07/02/2022 0016   KETONESUR 5 (A) 07/02/2022 0016   PROTEINUR 30 (A) 07/02/2022 0016   UROBILINOGEN 1.0 01/21/2009 0005   NITRITE NEGATIVE 07/02/2022 0016   LEUKOCYTESUR NEGATIVE 07/02/2022 0016   Sepsis Labs: Invalid input(s): "PROCALCITONIN", "LACTICIDVEN"  Microbiology: Recent Results (from the past 240 hour(s))  Resp panel by RT-PCR (RSV, Flu A&B, Covid) Anterior Nasal Swab     Status: Abnormal   Collection Time: 07/02/22  4:00 AM   Specimen: Anterior Nasal Swab  Result Value Ref Range Status   SARS Coronavirus 2 by  RT PCR POSITIVE (A) NEGATIVE Final    Comment: (NOTE) SARS-CoV-2 target nucleic acids are DETECTED.  The SARS-CoV-2 RNA is generally detectable in upper respiratory specimens during the acute phase of infection. Positive results are indicative of the presence of the identified virus, but do not rule out bacterial infection or co-infection with other pathogens not detected by the test. Clinical correlation with patient history and other diagnostic information is necessary to determine patient infection status. The expected result is Negative.  Fact Sheet for Patients: BloggerCourse.com  Fact Sheet for Healthcare Providers: SeriousBroker.it  This test is not yet approved or cleared by the [place] FDA and  has been authorized for detection and/or diagnosis of SARS-CoV-2 by FDA under an Emergency Use Authorization (EUA).  This EUA will remain in effect (meaning this test can be used) for the duration of  the COVID-19 declaration under Section 564(b)(1) of the A ct, 21 U.S.C. section 360bbb-3(b)(1), unless the authorization is terminated or revoked sooner.     Influenza A by PCR NEGATIVE NEGATIVE Final   Influenza B by PCR NEGATIVE NEGATIVE Final    Comment: (NOTE) The Xpert Xpress SARS-CoV-2/FLU/RSV plus assay is intended as an aid in the diagnosis of influenza from Nasopharyngeal swab specimens and should not be used as a sole basis for treatment. Nasal washings and aspirates are unacceptable for Xpert Xpress SARS-CoV-2/FLU/RSV testing.  Fact Sheet for Patients: BloggerCourse.com  Fact Sheet for Healthcare Providers: SeriousBroker.it  This test is not yet approved or cleared by the [place] FDA and has been authorized for detection and/or diagnosis of SARS-CoV-2 by FDA under an Emergency Use Authorization (EUA). This EUA will remain in effect (meaning this test can be  used) for the duration of the COVID-19 declaration under Section 564(b)(1) of the Act, 21 U.S.C. section 360bbb-3(b)(1), unless the authorization is terminated or revoked.     Resp Syncytial Virus by PCR NEGATIVE NEGATIVE Final    Comment: (NOTE) Fact Sheet for Patients: BloggerCourse.com  Fact Sheet for Healthcare Providers: SeriousBroker.it  This test is not yet approved or cleared by the [place] FDA and has been authorized for detection and/or diagnosis of SARS-CoV-2 by FDA under an Emergency Use Authorization (EUA). This EUA will remain in effect (meaning this test can be used) for the duration of the COVID-19 declaration under Section 564(b)(1) of the Act, 21 U.S.C. section 360bbb-3(b)(1), unless the authorization is terminated or revoked.  Performed at  [facility], [place] [address], [place], [place] [address]     Radiology Studies: No results found.    Lumir Demetriou T. [place]  If 7PM-7AM, please contact night-coverage [email or web address] 07/04/2022, 12:20 PM

## 2022-07-05 DIAGNOSIS — F319 Bipolar disorder, unspecified: Secondary | ICD-10-CM | POA: Diagnosis not present

## 2022-07-05 DIAGNOSIS — R251 Tremor, unspecified: Secondary | ICD-10-CM | POA: Diagnosis not present

## 2022-07-05 DIAGNOSIS — T796XXA Traumatic ischemia of muscle, initial encounter: Secondary | ICD-10-CM | POA: Diagnosis not present

## 2022-07-05 DIAGNOSIS — G9341 Metabolic encephalopathy: Secondary | ICD-10-CM | POA: Diagnosis not present

## 2022-07-05 LAB — COMPREHENSIVE METABOLIC PANEL
ALT: 38 U/L (ref 0–44)
AST: 87 U/L — ABNORMAL HIGH (ref 15–41)
Albumin: 3.2 g/dL — ABNORMAL LOW (ref 3.5–5.0)
Alkaline Phosphatase: 57 U/L (ref 38–126)
Anion gap: 10 (ref 5–15)
BUN: 15 mg/dL (ref 8–23)
CO2: 21 mmol/L — ABNORMAL LOW (ref 22–32)
Calcium: 9.1 mg/dL (ref 8.9–10.3)
Chloride: 103 mmol/L (ref 98–111)
Creatinine, Ser: 1.19 mg/dL (ref 0.61–1.24)
GFR, Estimated: 59 mL/min — ABNORMAL LOW (ref >60)
Glucose, Bld: 90 mg/dL (ref 70–99)
Potassium: 4.2 mmol/L (ref 3.5–5.1)
Sodium: 134 mmol/L — ABNORMAL LOW (ref 135–145)
Total Bilirubin: 1.5 mg/dL — ABNORMAL HIGH (ref 0.3–1.2)
Total Protein: 6.5 g/dL (ref 6.5–8.1)

## 2022-07-05 LAB — CK: Total CK: 279 U/L (ref 49–397)

## 2022-07-05 MED ORDER — PRAVASTATIN SODIUM 40 MG PO TABS
20.0000 mg | ORAL_TABLET | Freq: Every day | ORAL | Status: DC
  Administered 2022-07-05 – 2022-07-08 (×4): 20 mg via ORAL
  Filled 2022-07-05 (×4): qty 1

## 2022-07-05 NOTE — NC FL2 (Signed)
[place] LEVEL OF CARE FORM     IDENTIFICATION  Patient Name: Sean Bishop Birthdate: [birth year] Sex: male Admission Date (Current Location): 07/01/2022  [facility] and [place] Number:  Herbalist and Address:  [facility], [place] [address] [place], [place] [address]      Provider Number: [id number]  Attending Physician Name and Address:  Mercy Riding, MD  Relative Name and Phone Number:       Current Level of Care: Hospital Recommended Level of Care: [place] Prior Approval Number:    Date Approved/Denied:   PASRR Number: [id number]  Discharge Plan: SNF    Current Diagnoses: Patient Active Problem List   Diagnosis Date Noted   Traumatic rhabdomyolysis ([place]) 07/03/2022   Obesity (BMI 30-39.9) 37/90/2409   Acute metabolic encephalopathy 73/53/2992   Hyponatremia 07/02/2022   Hyperbilirubinemia 07/02/2022   Dilated cardiomyopathy ([place]) 04/30/2021   History of stroke    Mitral regurgitation    DJD (degenerative joint disease)    Bipolar 1 disorder ([place])    Dizziness 02/26/2020   Dyslipidemia 02/26/2020   Bipolar disorder ([place]) 02/17/2018   Atrial fibrillation ([place]) 02/17/2018   Gait instability 02/16/2018   Tremor 02/16/2018   Renal insufficiency 02/16/2018   Polycythemia 02/16/2018   Hypertension 02/16/2018   AF (paroxysmal atrial fibrillation) ([place]) 02/16/2018   Atrial fibrillation, persistent ([place]) 02/16/2018   Tremors of nervous system 01/2018    Orientation RESPIRATION BLADDER Height & Weight     Self, Time, Situation, Place  Normal Continent, External catheter Weight: 214 lb 4.6 oz (97.2 kg) Height:  5\' 6"  (167.6 cm)  BEHAVIORAL SYMPTOMS/MOOD NEUROLOGICAL BOWEL NUTRITION STATUS      Continent    AMBULATORY STATUS COMMUNICATION OF NEEDS Skin   Extensive Assist Verbally Normal                       Personal Care Assistance Level of Assistance  Bathing, Feeding, Dressing Bathing  Assistance: Limited assistance Feeding assistance: Limited assistance Dressing Assistance: Limited assistance     Functional Limitations Info  Sight, Hearing, Speech Sight Info: Adequate Hearing Info: Adequate Speech Info: Adequate    SPECIAL CARE FACTORS FREQUENCY  PT (By licensed PT), OT (By licensed OT)                    Contractures Contractures Info: Not present    Additional Factors Info  Code Status Code Status Info: DNR             Current Medications (07/05/2022):  This is the current hospital active medication list Current Facility-Administered Medications  Medication Dose Route Frequency Provider Last Rate Last Admin   acetaminophen (TYLENOL) tablet 650 mg  650 mg Oral Q6H PRN Wendee Beavers T, MD   650 mg at 07/02/22 2336   Or   acetaminophen (TYLENOL) suppository 650 mg  650 mg Rectal Q6H PRN Mercy Riding, MD       apixaban (ELIQUIS) tablet 5 mg  5 mg Oral BID Wendee Beavers T, MD   5 mg at 07/05/22 0919   ascorbic acid (VITAMIN C) tablet 500 mg  500 mg Oral BID Wendee Beavers T, MD   500 mg at 07/05/22 0919   diltiazem (CARDIZEM CD) 24 hr capsule 120 mg  120 mg Oral Daily Gonfa, Taye T, MD   120 mg at 07/05/22 0919   latanoprost (XALATAN) 0.005 % ophthalmic solution 1 drop  1 drop Both Eyes QHS Candelaria Stagers T, MD   1 drop at 07/04/22 2203   lithium carbonate (LITHOBID) ER tablet 300 mg  300 mg Oral QPM Candelaria Stagers T, MD   300 mg at 07/04/22 1811   multivitamin with minerals tablet 1 tablet  1 tablet Oral Daily Candelaria Stagers T, MD   1 tablet at 07/05/22 0919   ondansetron (ZOFRAN) tablet 4 mg  4 mg Oral Q6H PRN Almon Hercules, MD       Or   ondansetron (ZOFRAN) injection 4 mg  4 mg Intravenous Q6H PRN Gonfa, Taye T, MD       timolol (TIMOPTIC) 0.5 % ophthalmic solution 1 drop  1 drop Both Eyes Daily Almon Hercules, MD   1 drop at 07/05/22 0919     Discharge Medications: Please see discharge summary for a list of discharge medications.  Relevant Imaging  Results:  Relevant Lab Results:   Additional Information SS# [id number]; COVID POSITIVE 07/02/22  Deatra Robinson, LCSW

## 2022-07-05 NOTE — TOC Transition Note (Signed)
Transition of Care [facility]) - CM/SW Discharge Note   Patient Details  Name: Sean Bishop MRN: [id number] Date of Birth: 27-Apr-1933  Transition of Care [facility]) CM/SW Contact:  Amador Cunas, [place] Phone Number: 07/05/2022, 12:18 PM   Clinical Narrative:  Pt admitted from [place]. Spoke to pt's wife re PT recommendation for SNF. Pt's wife reports agreeable to SNF at [facility]. Voicemail left for Katie in rehab admissions at [facility] requesting return call Monday.   Pt tested positive for COVID on 1/4, unsure how many days post COVID diagnosis [facility] requires for admission. FL2 complete and referral made to [facility]. Will need to start auth closer to dc.   Wandra Feinstein, MSW, LCSW [phone] (coverage)        Barriers to Discharge: Continued Medical Work up, Ship broker, SNF Pending bed offer   Patient Goals and CMS Choice CMS Medicare.gov Compare Post Acute Care list provided to:: Patient Choice offered to / list presented to : Spouse, Patient  Discharge Placement                         Discharge Plan and Services Additional resources added to the After Visit Summary for       Post Acute Care Choice: [place]                               Social Determinants of Health (SDOH) Interventions SDOH Screenings   Food Insecurity: No Food Insecurity (07/02/2022)  Housing: [place] Risk  (07/02/2022)  Transportation Needs: No Transportation Needs (07/02/2022)  Utilities: Not At Risk (07/02/2022)  Tobacco Use: Medium Risk (07/01/2022)     Readmission Risk Interventions     No data to display

## 2022-07-05 NOTE — Plan of Care (Signed)
  Problem: Activity: Goal: Risk for activity intolerance will decrease Outcome: Progressing   Problem: Coping: Goal: Level of anxiety will decrease Outcome: Progressing   Problem: Safety: Goal: Ability to remain free from injury will improve Outcome: Progressing   

## 2022-07-05 NOTE — Progress Notes (Signed)
IVT consult placed for PIV placement. Patient currently has no IV medications ordered. Secure chat sent to primary RN requesting that patient remain with PIV since no meds are ordered. In agreement that patient will remain without IV unless status changes.  Garett Tetzloff Lorita Officer, RN

## 2022-07-05 NOTE — Progress Notes (Signed)
PROGRESS NOTE  Sean Bishop XBJ:[id number] DOB: 1933-03-12   PCP: Faustino Congress, NP  Patient is from: [facility].  DOA: 07/01/2022 LOS: 3  Chief complaints Chief Complaint  Patient presents with   Altered Mental Status     Brief Narrative / Interim history: 89 y.o. male with cognitive impairment, CVA, tremors, bipolar disorder, paroxysmal A-fib, dCM/systolic CHF, GERD and HYQ-6V brought to ED by EMS due to confusion, ground-level fall and generalized weakness, and admitted for acute metabolic encephalopathy in the setting of COVID-19 infection, and rhabdomyolysis.  CT head raises concern for NPH.  Otherwise, encephalopathy workup unrevealing.   Encephalopathy and rhabdomyolysis resolved.  He is oriented to self, place and person which is to be his baseline.  Therapy recommended SNF.  He may have to complete isolation precaution for COVID-19 before transfer.  Medically stable for discharge.  Subjective: Seen and examined earlier this morning.  No major events overnight of this morning.  No complaints other than some cough.  Denies chest pain or shortness of breath.  Cough is productive.  No hemoptysis.  Denies GI or UTI symptoms.  Objective: Vitals:   07/05/22 0200 07/05/22 0500 07/05/22 0743 07/05/22 1141  BP: (!) 144/70 (!) 148/82 (!) 137/54 (!) 155/82  Pulse: 86 76 (!) 112 78  Resp: 16 18 18 18   Temp: 98.8 F (37.1 C) 99.1 F (37.3 C) 99 F (37.2 C) 98.9 F (37.2 C)  TempSrc: Oral Oral Oral Oral  SpO2: 97% 98% 95% 99%  Weight:      Height:        Examination:  GENERAL: No apparent distress.  Nontoxic. HEENT: MMM.  Vision and hearing grossly intact.  NECK: Supple.  No apparent JVD.  RESP:  No IWOB.  Fair aeration bilaterally. CVS:  RRR. Heart sounds normal.  ABD/GI/GU: BS+. Abd soft, NTND.  MSK/EXT:  Moves extremities. No apparent deformity. No edema.  SKIN: no apparent skin lesion or wound NEURO: Awake and alert. Oriented to self, place and person but not  time.  No apparent focal neuro deficit. PSYCH: Calm. Normal affect.   Procedures:  None  Microbiology summarized: [id number], influenza and RSV PCR nonreactive.  Assessment and plan: Principal Problem:   Acute metabolic encephalopathy Active Problems:   Gait instability   Tremor   Hypertension   Atrial fibrillation, persistent (HCC)   Bipolar disorder (HCC)   Hyponatremia   Hyperbilirubinemia   Traumatic rhabdomyolysis (HCC)   Obesity (BMI 30-39.9)  Acute metabolic encephalopathy/possible delirium: Likely due to COVID-19 infection.  Seems to have underlying cognitive impairment as well.  CT head raises concern for NPH.  Ammonia, UDS, TSH and B12 within normal.  He has chronic gait abnormality.  Not on nephrotoxic meds.  Lithium level slightly low.  No focal deficit on exam.  Encephalopathy seems to have resolved.  Oriented to self, place and person which seems to be his baseline. -Reorientation and delirium precautions. -Avoid sedating medications   Generalized weakness/Gait instability/ground-level fall: Per wife, he was diagnosed with Parkinson disease at some point and started on Parkinson medication that has affected his cognition.  Parkinson medication was stopped and his cognition improved.  No significant trauma or injury.  Per wife, did not hit his head or loss consciousness.  CT head without acute finding.  He has no focal neurosymptoms other than some tremors. -Fall precaution,  -PT/OT recommended SNF.  Traumatic rhabdomyolysis: Mild.  CK trended from 1700>>3000>>> 280 -Discontinue IV fluid -Resume home statin.   COVID-19 infection: No  significant respiratory symptoms other than some cough to warrant treatment. -Continue isolation precaution  Chronic combined CHF: TTE in 11/2021 with LVEF of 45 to 50%, indeterminate DD.  Not on diuretics.  Appears euvolemic. -Discontinue IV fluid.   Persistent A-fib: Rate controlled. -Continue home Cardizem and Eliquis   CKD-3A:  Stable -Monitor   Hyponatremia: Na 131.  Due to lithium?  Improving. -Continue monitoring   Hyperbilirubinemia: Mild and improved. -Continue monitoring   Bipolar disorder: Followed by psychiatry.  Seems like his lithium was decreased from 450 mg to 300 mg about a week ago.  Lithium level slightly low at 0.47 -Continue home lithium at 300 mg daily for now   Hypogonadism: Seems to be on testosterone injection. -Outpatient follow-up   Glaucoma -Continue eyedrops  Obesity Body mass index is 34.59 kg/m.           DVT prophylaxis:   apixaban (ELIQUIS) tablet 5 mg  Code Status: DNR/DNI Family Communication: None at bedside. Level of care: Telemetry Medical Status is: Inpatient Remains inpatient appropriate because: SNF bed   Final disposition: SNF Consultants:  None  Sch Meds:  Scheduled Meds:  apixaban  5 mg Oral BID   ascorbic acid  500 mg Oral BID   diltiazem  120 mg Oral Daily   latanoprost  1 drop Both Eyes QHS   lithium carbonate  300 mg Oral QPM   multivitamin with minerals  1 tablet Oral Daily   timolol  1 drop Both Eyes Daily   Continuous Infusions:   PRN Meds:.acetaminophen **OR** acetaminophen, ondansetron **OR** ondansetron (ZOFRAN) IV  Antimicrobials: Anti-infectives (From admission, onward)    None        I have personally reviewed the following labs and images: CBC: Recent Labs  Lab 07/01/22 2346 07/03/22 0816 07/04/22 1101  WBC 12.8* 10.7* 8.9  NEUTROABS 11.2*  --   --   HGB 15.7 16.8 15.9  HCT 49.0 49.6 46.8  MCV 96.5 92.4 91.9  PLT 207 171 187   BMP &GFR Recent Labs  Lab 07/01/22 2346 07/03/22 0816 07/04/22 1101 07/05/22 0309  NA 131* 134* 135 134*  K 4.2 4.4 4.1 4.2  CL 101 102 106 103  CO2 22 23 21* 21*  GLUCOSE 119* 92 113* 90  BUN 15 13 15 15   CREATININE 1.37* 1.20 1.14 1.19  CALCIUM 9.8 9.6 9.1 9.1  MG  --  1.9 1.9  --   PHOS  --  2.8  --   --    Estimated Creatinine Clearance: 47.8 mL/min (by C-G formula  based on SCr of 1.19 mg/dL). Liver & Pancreas: Recent Labs  Lab 07/01/22 2346 07/03/22 0816 07/04/22 1101 07/05/22 0309  AST 37 137* 105* 87*  ALT 23 36 38 38  ALKPHOS 68 56 55 57  BILITOT 1.9* 2.0* 1.4* 1.5*  PROT 7.0 6.9 6.5 6.5  ALBUMIN 3.9 3.6 3.3* 3.2*   No results for input(s): "LIPASE", "AMYLASE" in the last 168 hours. Recent Labs  Lab 07/02/22 1132  AMMONIA 22   Diabetic: No results for input(s): "HGBA1C" in the last 72 hours. No results for input(s): "GLUCAP" in the last 168 hours. Cardiac Enzymes: Recent Labs  Lab 07/02/22 1132 07/03/22 0816 07/04/22 1101 07/05/22 0309  CKTOTAL 1,774* 3,117* 653* 279   No results for input(s): "PROBNP" in the last 8760 hours. Coagulation Profile: No results for input(s): "INR", "PROTIME" in the last 168 hours. Thyroid Function Tests: No results for input(s): "TSH", "T4TOTAL", "FREET4", "T3FREE", "THYROIDAB" in  the last 72 hours.  Lipid Profile: No results for input(s): "CHOL", "HDL", "LDLCALC", "TRIG", "CHOLHDL", "LDLDIRECT" in the last 72 hours. Anemia Panel: No results for input(s): "VITAMINB12", "FOLATE", "FERRITIN", "TIBC", "IRON", "RETICCTPCT" in the last 72 hours.  Urine analysis:    Component Value Date/Time   COLORURINE YELLOW 07/02/2022 0016   APPEARANCEUR CLEAR 07/02/2022 0016   LABSPEC 1.009 07/02/2022 0016   PHURINE 6.0 07/02/2022 0016   GLUCOSEU NEGATIVE 07/02/2022 0016   HGBUR NEGATIVE 07/02/2022 0016   BILIRUBINUR NEGATIVE 07/02/2022 0016   KETONESUR 5 (A) 07/02/2022 0016   PROTEINUR 30 (A) 07/02/2022 0016   UROBILINOGEN 1.0 01/21/2009 0005   NITRITE NEGATIVE 07/02/2022 0016   LEUKOCYTESUR NEGATIVE 07/02/2022 0016   Sepsis Labs: Invalid input(s): "PROCALCITONIN", "LACTICIDVEN"  Microbiology: Recent Results (from the past 240 hour(s))  Resp panel by RT-PCR (RSV, Flu A&B, Covid) Anterior Nasal Swab     Status: Abnormal   Collection Time: 07/02/22  4:00 AM   Specimen: Anterior Nasal Swab   Result Value Ref Range Status   SARS Coronavirus 2 by RT PCR POSITIVE (A) NEGATIVE Final    Comment: (NOTE) SARS-CoV-2 target nucleic acids are DETECTED.  The SARS-CoV-2 RNA is generally detectable in upper respiratory specimens during the acute phase of infection. Positive results are indicative of the presence of the identified virus, but do not rule out bacterial infection or co-infection with other pathogens not detected by the test. Clinical correlation with patient history and other diagnostic information is necessary to determine patient infection status. The expected result is Negative.  Fact Sheet for Patients: BloggerCourse.com  Fact Sheet for Healthcare Providers: SeriousBroker.it  This test is not yet approved or cleared by the [place] FDA and  has been authorized for detection and/or diagnosis of SARS-CoV-2 by FDA under an Emergency Use Authorization (EUA).  This EUA will remain in effect (meaning this test can be used) for the duration of  the COVID-19 declaration under Section 564(b)(1) of the A ct, 21 U.S.C. section 360bbb-3(b)(1), unless the authorization is terminated or revoked sooner.     Influenza A by PCR NEGATIVE NEGATIVE Final   Influenza B by PCR NEGATIVE NEGATIVE Final    Comment: (NOTE) The Xpert Xpress SARS-CoV-2/FLU/RSV plus assay is intended as an aid in the diagnosis of influenza from Nasopharyngeal swab specimens and should not be used as a sole basis for treatment. Nasal washings and aspirates are unacceptable for Xpert Xpress SARS-CoV-2/FLU/RSV testing.  Fact Sheet for Patients: BloggerCourse.com  Fact Sheet for Healthcare Providers: SeriousBroker.it  This test is not yet approved or cleared by the [place] FDA and has been authorized for detection and/or diagnosis of SARS-CoV-2 by FDA under an Emergency Use Authorization (EUA).  This EUA will remain in effect (meaning this test can be used) for the duration of the COVID-19 declaration under Section 564(b)(1) of the Act, 21 U.S.C. section 360bbb-3(b)(1), unless the authorization is terminated or revoked.     Resp Syncytial Virus by PCR NEGATIVE NEGATIVE Final    Comment: (NOTE) Fact Sheet for Patients: BloggerCourse.com  Fact Sheet for Healthcare Providers: SeriousBroker.it  This test is not yet approved or cleared by the [place] FDA and has been authorized for detection and/or diagnosis of SARS-CoV-2 by FDA under an Emergency Use Authorization (EUA). This EUA will remain in effect (meaning this test can be used) for the duration of the COVID-19 declaration under Section 564(b)(1) of the Act, 21 U.S.C. section 360bbb-3(b)(1), unless the authorization is terminated or revoked.  Performed at [facility], [address] [place], [place] [address]     Radiology Studies: No results found.    Deontre Allsup T. Katheryn Culliton [facility]  If 7PM-7AM, please contact night-coverage [email or web address] 07/05/2022, 1:04 PM

## 2022-07-06 DIAGNOSIS — R251 Tremor, unspecified: Secondary | ICD-10-CM | POA: Diagnosis not present

## 2022-07-06 DIAGNOSIS — G9341 Metabolic encephalopathy: Secondary | ICD-10-CM | POA: Diagnosis not present

## 2022-07-06 DIAGNOSIS — T796XXA Traumatic ischemia of muscle, initial encounter: Secondary | ICD-10-CM | POA: Diagnosis not present

## 2022-07-06 DIAGNOSIS — F319 Bipolar disorder, unspecified: Secondary | ICD-10-CM | POA: Diagnosis not present

## 2022-07-06 LAB — COMPREHENSIVE METABOLIC PANEL
ALT: 38 U/L (ref 0–44)
AST: 64 U/L — ABNORMAL HIGH (ref 15–41)
Albumin: 3.1 g/dL — ABNORMAL LOW (ref 3.5–5.0)
Alkaline Phosphatase: 58 U/L (ref 38–126)
Anion gap: 8 (ref 5–15)
BUN: 15 mg/dL (ref 8–23)
CO2: 22 mmol/L (ref 22–32)
Calcium: 9.1 mg/dL (ref 8.9–10.3)
Chloride: 105 mmol/L (ref 98–111)
Creatinine, Ser: 1.11 mg/dL (ref 0.61–1.24)
GFR, Estimated: >60 mL/min (ref >60)
Glucose, Bld: 88 mg/dL (ref 70–99)
Potassium: 3.8 mmol/L (ref 3.5–5.1)
Sodium: 135 mmol/L (ref 135–145)
Total Bilirubin: 1.5 mg/dL — ABNORMAL HIGH (ref 0.3–1.2)
Total Protein: 6.4 g/dL — ABNORMAL LOW (ref 6.5–8.1)

## 2022-07-06 LAB — PHOSPHORUS: Phosphorus: 2.6 mg/dL (ref 2.5–4.6)

## 2022-07-06 LAB — MAGNESIUM: Magnesium: 1.9 mg/dL (ref 1.7–2.4)

## 2022-07-06 NOTE — Progress Notes (Signed)
Physical Therapy Treatment Patient Details Name: Sean Bishop MRN: [id number] DOB: 1933/04/09 Today's Date: 07/06/2022   History of Present Illness Pt presenting via EMS on 1/3 with witnessed fall in which wife reports he did not hit his head, however, was unable to comprehend and answer questions. PMH significant for A-fib on Eliquis, bipolar disorder on lithium, hypertension, CVA, CHF, GERD, CKD-3A.    PT Comments    Pt greeted reclined in bed and agreeable to session with continued progress towards acute goals. Pt able to complete bed mobility with up to mod assist with pt needing cues to initiate mobility and elevate trunk. Pt coming to stand with RW and min assist to power up and steady on rise as pt with slight posterior lean and general unsteadiness noted. Pt able to progress gait this date in room with cues for larger stride and increased foot clearance as pt with shuffling steps throughout. Pt pleasantly confused throughout session, however talkative and recalling stories from earlier in life with pt needing some redirection to task at hand intermittently. Pt continues to benefit from skilled PT services to progress toward functional mobility goals.    Recommendations for follow up therapy are one component of a multi-disciplinary discharge planning process, led by the attending physician.  Recommendations may be updated based on patient status, additional functional criteria and insurance authorization.  Follow Up Recommendations  Skilled nursing-short term rehab (<3 hours/day) Can patient physically be transported by private vehicle: No   Assistance Recommended at Discharge Frequent or constant Supervision/Assistance  Patient can return home with the following A lot of help with walking and/or transfers;Help with stairs or ramp for entrance;Assistance with cooking/housework;Assist for transportation   Equipment Recommendations  Rolling walker (2 wheels);Other (comment) (defer to  post acute)    Recommendations for Other Services       Precautions / Restrictions Precautions Precautions: Fall Precaution Comments: watch BP Restrictions Weight Bearing Restrictions: No     Mobility  Bed Mobility Overal bed mobility: Needs Assistance Bed Mobility: Supine to Sit     Supine to sit: Mod assist     General bed mobility comments: mod assist to elevate trunk, cues and assist to scoot out to EOB    Transfers Overall transfer level: Needs assistance Equipment used: Rolling walker (2 wheels) Transfers: Sit to/from Stand Sit to Stand: Min assist           General transfer comment: verbal cues for sequencing and safe hand placement.    Ambulation/Gait Ambulation/Gait assistance: Min assist Gait Distance (Feet): 30 Feet Assistive device: Rolling walker (2 wheels) Gait Pattern/deviations: Wide base of support Gait velocity: decr   Pre-gait activities: marching in place General Gait Details: low foot clearance, min assist to steady and for RW management   Stairs             Wheelchair Mobility    Modified Rankin (Stroke Patients Only) Modified Rankin (Stroke Patients Only) Pre-Morbid Rankin Score: No significant disability Modified Rankin: Severe disability     Balance Overall balance assessment: Needs assistance Sitting-balance support: Single extremity supported, Bilateral upper extremity supported Sitting balance-Leahy Scale: Poor Sitting balance - Comments: slight posterior lean initially moving to min guard with cues to keep keep feet on floor Postural control: Posterior lean Standing balance support: Bilateral upper extremity supported Standing balance-Leahy Scale: Poor Standing balance comment: Pt has difficulty with dynamic standing balance he requires moderate assist to maintain forward COM over BOS when standing.  Cognition Arousal/Alertness: Awake/alert Behavior During Therapy: WFL for  tasks assessed/performed Overall Cognitive Status: No family/caregiver present to determine baseline cognitive functioning                                 General Comments: pt oriented to self, answering March to month and unable to state date, with prompts pt able to state he is in hospital. Pt very chatty with stories from past, needing to be redirected to attend to task when talking        Exercises      General Comments        Pertinent Vitals/Pain Pain Assessment Pain Assessment: No/denies pain    Home Living                          Prior Function            PT Goals (current goals can now be found in the care plan section) Acute Rehab PT Goals Time For Goal Achievement: 07/17/22    Frequency    Min 3X/week      PT Plan Current plan remains appropriate    Co-evaluation              AM-PAC PT "6 Clicks" Mobility   Outcome Measure  Help needed turning from your back to your side while in a flat bed without using bedrails?: A Lot Help needed moving from lying on your back to sitting on the side of a flat bed without using bedrails?: A Lot Help needed moving to and from a bed to a chair (including a wheelchair)?: A Little Help needed standing up from a chair using your arms (e.g., wheelchair or bedside chair)?: A Lot Help needed to walk in hospital room?: A Little Help needed climbing 3-5 steps with a railing? : Total 6 Click Score: 13    End of Session Equipment Utilized During Treatment: Gait belt Activity Tolerance: Patient tolerated treatment well Patient left: in chair;with call bell/phone within reach;with chair alarm set Nurse Communication: Mobility status PT Visit Diagnosis: Other abnormalities of gait and mobility (R26.89);Unsteadiness on feet (R26.81)     Time: 1610-9604 PT Time Calculation (min) (ACUTE ONLY): 31 min  Charges:  $Gait Training: 8-22 mins $Therapeutic Activity: 8-22 mins           Jaicion Laurie R.  PTA Acute Rehabilitation Services Office: [phone]    Catalina Antigua 07/06/2022, 3:37 PM

## 2022-07-06 NOTE — TOC Progression Note (Signed)
Transition of Care [facility]) - Progression Note    Patient Details  Name: Sean Bishop MRN: [id number] Date of Birth: 1933-06-11  [facility] Care [facility]) CM/SW Contact  Jinger Neighbors, [place] Phone Number: 07/06/2022, 3:54 PM  Clinical Narrative:    CSW spoke with Joellen Jersey at [facility]. She has consulted with her DON and will f/u up with CSW on 07/07/2022.   Expected Discharge Plan: [place] Barriers to Discharge: Continued Medical Work up, Ship broker, SNF Pending bed offer  Expected Discharge Plan and [place] Choice: [place] arrangements for the past 2 months: [place]                                       Social Determinants of Health (SDOH) Interventions SDOH Screenings   Food Insecurity: No Food Insecurity (07/02/2022)  Housing: Low Risk  (07/02/2022)  Transportation Needs: No Transportation Needs (07/02/2022)  Utilities: Not At Risk (07/02/2022)  Tobacco Use: Medium Risk (07/01/2022)    Readmission Risk Interventions     No data to display

## 2022-07-06 NOTE — Progress Notes (Signed)
PROGRESS NOTE  Sean Bishop [id number] DOB: [birth year]   PCP: Faustino Congress, NP  Patient is from: [facility].  DOA: 07/01/2022 LOS: 4  Chief complaints Chief Complaint  Patient presents with   Altered Mental Status     Brief Narrative / Interim history: 89 y.o. male with cognitive impairment, CVA, tremors, bipolar disorder, paroxysmal A-fib, dCM/systolic CHF, GERD and [id number] brought to ED by EMS due to confusion, ground-level fall and generalized weakness, and admitted for acute metabolic encephalopathy in the setting of COVID-19 infection, and rhabdomyolysis.  CT head raises concern for NPH.  Otherwise, encephalopathy workup unrevealing.   Encephalopathy and rhabdomyolysis resolved.  He is oriented to self, place and person which is to be his baseline.  Therapy recommended SNF.  He may have to complete isolation precaution for COVID-19 before transfer.  Medically stable for discharge.  Subjective: Seen and examined earlier this morning.  No major events overnight of this morning.  No complaints other than some cough that is improving.  In bed eating breakfast.  He is oriented x 4 although he needs some clue with date month and year.  Objective: Vitals:   07/05/22 2304 07/06/22 0325 07/06/22 0953 07/06/22 1336  BP: 137/65 (!) 142/64 (!) 156/90 (!) 140/87  Pulse: 83 81 (!) 56 85  Resp: 18 16 17 20   Temp: 98.7 F (37.1 C) 99.3 F (37.4 C) 98.9 F (37.2 C) 98.9 F (37.2 C)  TempSrc: Oral Oral Oral Oral  SpO2: 95% 96% 98% 96%  Weight:      Height:        Examination:   GENERAL: No apparent distress.  Nontoxic. HEENT: MMM.  Vision and hearing grossly intact.  NECK: Supple.  No apparent JVD.  RESP:  No IWOB.  Fair aeration bilaterally. CVS:  RRR. Heart sounds normal.  ABD/GI/GU: BS+. Abd soft, NTND.  MSK/EXT:  Moves extremities. No apparent deformity. No edema.  SKIN: no apparent skin lesion or wound NEURO: Awake and alert. Oriented appropriately.  Has  resting tremor in the right arm and right leg.  No apparent focal neuro deficit. PSYCH: Calm. Normal affect.   Procedures:  None  Microbiology summarized: [id number], influenza and RSV PCR nonreactive.  Assessment and plan: Principal Problem:   Acute metabolic encephalopathy Active Problems:   Gait instability   Tremor   Hypertension   Atrial fibrillation, persistent (HCC)   Bipolar disorder (HCC)   Hyponatremia   Hyperbilirubinemia   Traumatic rhabdomyolysis (HCC)   Obesity (BMI 30-39.9)  Acute metabolic encephalopathy/possible delirium: Likely due to COVID-19 infection.  Seems to have underlying cognitive impairment as well.  CT head raises concern for NPH.  Ammonia, UDS, TSH and B12 within normal.  He has chronic gait abnormality.  Not on nephrotoxic meds.  Lithium level slightly low.  No focal deficit on exam.  Encephalopathy seems to have resolved.  Oriented to self, place and person.  Able to tell me the date, month and year with some clues. -Reorientation and delirium precautions. -Avoid sedating medications   Generalized weakness/Gait instability/ground-level fall: Per wife, he was diagnosed with Parkinson disease at some point and started on Parkinson medication that has affected his cognition.  Parkinson medication was stopped and his cognition improved.  No significant trauma or injury.  Per wife, did not hit his head or loss consciousness.  CT head without acute finding.  He has no focal neurosymptoms other than some tremors. -Fall precaution,  -PT/OT recommended SNF.  Parkinson's disease with dementia: Not on  medication.  Has resting left arm and leg tremor. -Outpatient follow-up  Traumatic rhabdomyolysis: Mild.  CK trended from 1700>>3000>>> 280   COVID-19 infection: No significant respiratory symptoms other than some cough to warrant treatment. -Continue isolation precaution  Chronic combined CHF: TTE in 11/2021 with LVEF of 45 to 50%, indeterminate DD.  Not on  diuretics.  Euvolemic.   Persistent A-fib: Rate controlled. -Continue home Cardizem and Eliquis   CKD-3A: Stable -Monitor   Hyponatremia: Resolved.   Hyperbilirubinemia: Mild and improved.   Bipolar disorder: Followed by psychiatry.  Seems like his lithium was decreased from 450 mg to 300 mg about a week ago.  Lithium level slightly low at 0.47 -Continue home lithium at 300 mg daily for now   Hypogonadism: Seems to be on testosterone injection. -Outpatient follow-up   Glaucoma -Continue eyedrops   Obesity Body mass index is 34.59 kg/m.           DVT prophylaxis:   apixaban (ELIQUIS) tablet 5 mg  Code Status: DNR/DNI Family Communication: Updated patient's wife over the phone on 1/7.  None at bedside today. Level of care: Med-Surg Status is: Inpatient Remains inpatient appropriate because: SNF bed   Final disposition: SNF Consultants:  None  Sch Meds:  Scheduled Meds:  apixaban  5 mg Oral BID   ascorbic acid  500 mg Oral BID   diltiazem  120 mg Oral Daily   latanoprost  1 drop Both Eyes QHS   lithium carbonate  300 mg Oral QPM   multivitamin with minerals  1 tablet Oral Daily   pravastatin  20 mg Oral q1800   timolol  1 drop Both Eyes Daily   Continuous Infusions:   PRN Meds:.acetaminophen **OR** acetaminophen, ondansetron **OR** ondansetron (ZOFRAN) IV  Antimicrobials: Anti-infectives (From admission, onward)    None        I have personally reviewed the following labs and images: CBC: Recent Labs  Lab 07/01/22 2346 07/03/22 0816 07/04/22 1101  WBC 12.8* 10.7* 8.9  NEUTROABS 11.2*  --   --   HGB 15.7 16.8 15.9  HCT 49.0 49.6 46.8  MCV 96.5 92.4 91.9  PLT 207 171 187   BMP &GFR Recent Labs  Lab 07/01/22 2346 07/03/22 0816 07/04/22 1101 07/05/22 0309 07/06/22 0406  NA 131* 134* 135 134* 135  K 4.2 4.4 4.1 4.2 3.8  CL 101 102 106 103 105  CO2 22 23 21* 21* 22  GLUCOSE 119* 92 113* 90 88  BUN 15 13 15 15 15   CREATININE  1.37* 1.20 1.14 1.19 1.11  CALCIUM 9.8 9.6 9.1 9.1 9.1  MG  --  1.9 1.9  --  1.9  PHOS  --  2.8  --   --  2.6   Estimated Creatinine Clearance: 51.2 mL/min (by C-G formula based on SCr of 1.11 mg/dL). Liver & Pancreas: Recent Labs  Lab 07/01/22 2346 07/03/22 0816 07/04/22 1101 07/05/22 0309 07/06/22 0406  AST 37 137* 105* 87* 64*  ALT 23 36 38 38 38  ALKPHOS 68 56 55 57 58  BILITOT 1.9* 2.0* 1.4* 1.5* 1.5*  PROT 7.0 6.9 6.5 6.5 6.4*  ALBUMIN 3.9 3.6 3.3* 3.2* 3.1*   No results for input(s): "LIPASE", "AMYLASE" in the last 168 hours. Recent Labs  Lab 07/02/22 1132  AMMONIA 22   Diabetic: No results for input(s): "HGBA1C" in the last 72 hours. No results for input(s): "GLUCAP" in the last 168 hours. Cardiac Enzymes: Recent Labs  Lab 07/02/22 1132 07/03/22  1027 07/04/22 1101 07/05/22 0309  CKTOTAL 1,774* 3,117* 653* 279   No results for input(s): "PROBNP" in the last 8760 hours. Coagulation Profile: No results for input(s): "INR", "PROTIME" in the last 168 hours. Thyroid Function Tests: No results for input(s): "TSH", "T4TOTAL", "FREET4", "T3FREE", "THYROIDAB" in the last 72 hours.  Lipid Profile: No results for input(s): "CHOL", "HDL", "LDLCALC", "TRIG", "CHOLHDL", "LDLDIRECT" in the last 72 hours. Anemia Panel: No results for input(s): "VITAMINB12", "FOLATE", "FERRITIN", "TIBC", "IRON", "RETICCTPCT" in the last 72 hours.  Urine analysis:    Component Value Date/Time   COLORURINE YELLOW 07/02/2022 0016   APPEARANCEUR CLEAR 07/02/2022 0016   LABSPEC 1.009 07/02/2022 0016   PHURINE 6.0 07/02/2022 0016   GLUCOSEU NEGATIVE 07/02/2022 0016   HGBUR NEGATIVE 07/02/2022 0016   BILIRUBINUR NEGATIVE 07/02/2022 0016   KETONESUR 5 (A) 07/02/2022 0016   PROTEINUR 30 (A) 07/02/2022 0016   UROBILINOGEN 1.0 01/21/2009 0005   NITRITE NEGATIVE 07/02/2022 0016   LEUKOCYTESUR NEGATIVE 07/02/2022 0016   Sepsis Labs: Invalid input(s): "PROCALCITONIN",  "LACTICIDVEN"  Microbiology: Recent Results (from the past 240 hour(s))  Resp panel by RT-PCR (RSV, Flu A&B, Covid) Anterior Nasal Swab     Status: Abnormal   Collection Time: 07/02/22  4:00 AM   Specimen: Anterior Nasal Swab  Result Value Ref Range Status   SARS Coronavirus 2 by RT PCR POSITIVE (A) NEGATIVE Final    Comment: (NOTE) SARS-CoV-2 target nucleic acids are DETECTED.  The SARS-CoV-2 RNA is generally detectable in upper respiratory specimens during the acute phase of infection. Positive results are indicative of the presence of the identified virus, but do not rule out bacterial infection or co-infection with other pathogens not detected by the test. Clinical correlation with patient history and other diagnostic information is necessary to determine patient infection status. The expected result is Negative.  Fact Sheet for Patients: EntrepreneurPulse.com.au  Fact Sheet for Healthcare Providers: IncredibleEmployment.be  This test is not yet approved or cleared by the [place] FDA and  has been authorized for detection and/or diagnosis of SARS-CoV-2 by FDA under an Emergency Use Authorization (EUA).  This EUA will remain in effect (meaning this test can be used) for the duration of  the COVID-19 declaration under Section 564(b)(1) of the A ct, 21 U.S.C. section 360bbb-3(b)(1), unless the authorization is terminated or revoked sooner.     Influenza A by PCR NEGATIVE NEGATIVE Final   Influenza B by PCR NEGATIVE NEGATIVE Final    Comment: (NOTE) The Xpert Xpress SARS-CoV-2/FLU/RSV plus assay is intended as an aid in the diagnosis of influenza from Nasopharyngeal swab specimens and should not be used as a sole basis for treatment. Nasal washings and aspirates are unacceptable for Xpert Xpress SARS-CoV-2/FLU/RSV testing.  Fact Sheet for Patients: EntrepreneurPulse.com.au  Fact Sheet for Healthcare  Providers: IncredibleEmployment.be  This test is not yet approved or cleared by the [place] FDA and has been authorized for detection and/or diagnosis of SARS-CoV-2 by FDA under an Emergency Use Authorization (EUA). This EUA will remain in effect (meaning this test can be used) for the duration of the COVID-19 declaration under Section 564(b)(1) of the Act, 21 U.S.C. section 360bbb-3(b)(1), unless the authorization is terminated or revoked.     Resp Syncytial Virus by PCR NEGATIVE NEGATIVE Final    Comment: (NOTE) Fact Sheet for Patients: EntrepreneurPulse.com.au  Fact Sheet for Healthcare Providers: IncredibleEmployment.be  This test is not yet approved or cleared by the [place] FDA and has been authorized for detection  and/or diagnosis of SARS-CoV-2 by FDA under an Emergency Use Authorization (EUA). This EUA will remain in effect (meaning this test can be used) for the duration of the COVID-19 declaration under Section 564(b)(1) of the Act, 21 U.S.C. section 360bbb-3(b)(1), unless the authorization is terminated or revoked.  Performed at [facility], [address], [place], [place] [address]     Radiology Studies: No results found.    Dachelle Molzahn T. Catherin Doorn [facility]  If 7PM-7AM, please contact night-coverage [email or web address] 07/06/2022, 2:48 PM

## 2022-07-06 NOTE — Progress Notes (Addendum)
   07/06/22 1135  Spiritual Encounters  Type of Visit Initial  Care provided to: Patient  Referral source Nurse (RN/NT/LPN)  Reason for visit Routine spiritual support  OnCall Visit No  Spiritual Framework  Presenting Themes Meaning/purpose/sources of inspiration (This Chaplain understands that spirituality is important to the patientIn the patient's own words,"I am nothing without God")  Values/beliefs This chaplain understands that spirituality is important to the patient. In his own words,"I am nothing without God in my life"  Community/Connection Family  Strengths Faith  Patient Stress Factors Family relationships  Family Stress Factors Other (Comment) (Patient visibly smile when expressing how he met his wife and they have been married 39yrs. Patient expressed the challenged relationship he had w/his now deceased adopted daughter and does not have a relationship with her six children)  Interventions  Spiritual Care Interventions Made Compassionate presence;Reflective listening;Normalization of emotions;Meaning making;Prayer   CH responded to the consult request. This note was prepared by Jeanine Luz, M.Div..  For questions please contact by phone [phone].

## 2022-07-07 DIAGNOSIS — R251 Tremor, unspecified: Secondary | ICD-10-CM | POA: Diagnosis not present

## 2022-07-07 DIAGNOSIS — T796XXA Traumatic ischemia of muscle, initial encounter: Secondary | ICD-10-CM | POA: Diagnosis not present

## 2022-07-07 DIAGNOSIS — F319 Bipolar disorder, unspecified: Secondary | ICD-10-CM | POA: Diagnosis not present

## 2022-07-07 DIAGNOSIS — G9341 Metabolic encephalopathy: Secondary | ICD-10-CM | POA: Diagnosis not present

## 2022-07-07 MED ORDER — ORAL CARE MOUTH RINSE: 15.0000 mL | OROMUCOSAL | Status: DC | PRN

## 2022-07-07 NOTE — Progress Notes (Signed)
Occupational Therapy Treatment Patient Details Name: Sean Bishop MRN: [id number] DOB: [birth year] Today's Date: 07/07/2022   History of present illness Pt presenting via EMS on 1/3 with witnessed fall in which wife reports he did not hit his head, however, was unable to comprehend and answer questions. PMH significant for A-fib on Eliquis, bipolar disorder on lithium, hypertension, CVA, CHF, GERD, CKD-3A.   OT comments  Pt making steady progress towards OT goals this session. Pt continues to present with impaired balance,decreased activity tolerance and generalized deconditioning . Pt currently requires initial MIN A for static sitting balance progressing to min guard, MIN- MODA for sit>stands from EOB with RW and supervision for seated grooming tasks. Pt was able to take small steps forward/backwards with RW however pt reports dizziness unable to progress functional mobility distance. BPs assessed below. Pt c/o pain in LUE today, pt reports he [place] Pt seemed more disoriented today, see cog. Pt would continue to benefit from skilled occupational therapy while admitted and after d/c to address the below listed limitations in order to improve overall functional mobility and facilitate independence with BADL participation. DC plan remains appropriate, will follow acutely per POC.      Recommendations for follow up therapy are one component of a multi-disciplinary discharge planning process, led by the attending physician.  Recommendations may be updated based on patient status, additional functional criteria and insurance authorization.    Follow Up Recommendations  Skilled nursing-short term rehab (<3 hours/day)     Assistance Recommended at Discharge Intermittent Supervision/Assistance  Patient can return home with the following  A lot of help with walking and/or transfers;A lot of help with bathing/dressing/bathroom;Assistance with cooking/housework;Direct  supervision/assist for medications management;Direct supervision/assist for financial management;Assist for transportation;Help with stairs or ramp for entrance;Assistance with feeding   Equipment Recommendations  Other (comment) (defer to next venue of care)    Recommendations for Other Services      Precautions / Restrictions Precautions Precautions: Fall Precaution Comments: watch BP, covid + Restrictions Weight Bearing Restrictions: No       Mobility Bed Mobility Overal bed mobility: Needs Assistance Bed Mobility: Supine to Sit, Sit to Supine     Supine to sit: Mod assist Sit to supine: Mod assist   General bed mobility comments: pt unable to use LUE to elevate trunk, needing step by step cues to sequence bed mobiltiy tasks and needed MOD physical assist to scoot hips to EOB and elevate trunk into sitting. MOD A needed to elevate BLEs back to bed    Transfers Overall transfer level: Needs assistance Equipment used: Rolling walker (2 wheels) Transfers: Sit to/from Stand Sit to Stand: Min assist, Mod assist           General transfer comment: cues for hand placement as pt unable to use LUE, initial MOD A to power into standing, progressing to MIN A, pt able to take steps forward ~ 4 steps but reports not feeling like he was able to walk in room, steps noted to short and shuffled, reports dizziness     Balance Overall balance assessment: Needs assistance Sitting-balance support: Single extremity supported Sitting balance-Leahy Scale: Poor Sitting balance - Comments: slight posterior lean initially moving to min guard with cues to keep keep feet on floor Postural control: Posterior lean Standing balance support: Bilateral upper extremity supported, Reliant on assistive device for balance Standing balance-Leahy Scale: Poor  ADL either performed or assessed with clinical judgement   ADL Overall ADL's : Needs  assistance/impaired     Grooming: Wash/dry face;Sitting;Supervision/safety;Set up Grooming Details (indicate cue type and reason): sitting EOB                 Toilet Transfer: Moderate assistance;Minimal assistance;Rolling walker (2 wheels) Toilet Transfer Details (indicate cue type and reason): MIN- MOD A to simulate toilet transfer, only able to take ~ 5 steps forward/backwards         Functional mobility during ADLs: Minimal assistance;Moderate assistance;Rolling walker (2 wheels) General ADL Comments: ADL participation impacted by impaired cog today, impaired balance, and generalized deconditioning    Extremity/Trunk Assessment Upper Extremity Assessment Upper Extremity Assessment: Generalized weakness;LUE deficits/detail LUE Deficits / Details: AROM to ~ 20* shoulder flexion, pt reports '" [place] feel on[place]." LUE: Unable to fully assess due to pain;Shoulder pain with ROM   Lower Extremity Assessment Lower Extremity Assessment: Defer to PT evaluation        Vision Baseline Vision/History: 0 No visual deficits     Perception Perception Perception: Within Functional Limits   Praxis Praxis Praxis: Intact    Cognition Arousal/Alertness: Awake/alert Behavior During Therapy: WFL for tasks assessed/performed Overall Cognitive Status: No family/caregiver present to determine baseline cognitive functioning                                 General Comments: pt very chatty, seeming to have decreased insight into deficits as pt asking "why am I so weak?" going off on tangents about his son etc. worried about his wife been home alonem, reports hes been married for 500 years, stated the wrong age, but overall following all commands and able to redirect        Exercises      Shoulder Instructions       General Comments pt reports dizziness during sit>stand, BP EOB 144/111 ( 121) HR 81, on RA Spo2 97%,    Pertinent Vitals/ Pain       Pain  Assessment Pain Assessment: Faces Faces Pain Scale: Hurts a little bit Pain Location: LUE Pain Descriptors / Indicators: Grimacing, Guarding Pain Intervention(s): Limited activity within patient's tolerance, Monitored during session, Repositioned  Home Living                                          Prior Functioning/Environment              Frequency  Min 2X/week        Progress Toward Goals  OT Goals(current goals can now be found in the care plan section)  Progress towards OT goals: Progressing toward goals (gradually)  Acute Rehab OT Goals Patient Stated Goal: to be stronger OT Goal Formulation: With patient Time For Goal Achievement: 07/17/22 Potential to Achieve Goals: Major Discharge plan remains appropriate;Frequency remains appropriate    Co-evaluation                 AM-PAC OT "6 Clicks" Daily Activity     Outcome Measure   Help from another person eating meals?: A Little Help from another person taking care of personal grooming?: A Little Help from another person toileting, which includes using toliet, bedpan, or urinal?: A Lot Help from another person bathing (including washing, rinsing,  drying)?: A Lot Help from another person to put on and taking off regular upper body clothing?: A Little Help from another person to put on and taking off regular lower body clothing?: A Lot 6 Click Score: 15    End of Session Equipment Utilized During Treatment: Gait belt;Rolling walker (2 wheels)  OT Visit Diagnosis: Unsteadiness on feet (R26.81);Muscle weakness (generalized) (M62.81);Other abnormalities of gait and mobility (R26.89);History of falling (Z91.81);Other symptoms and signs involving cognitive function;Dizziness and giddiness (R42)   Activity Tolerance Patient tolerated treatment well   Patient Left in bed;with call bell/phone within reach;with bed alarm set   Nurse Communication Mobility status        Time:  5625-6389 OT Time Calculation (min): 25 min  Charges: OT General Charges $OT Visit: 1 Visit OT Treatments $Self Care/Home Management : 8-22 mins $Therapeutic Activity: 8-22 mins  Lenor Derrick., COTA/L Acute Rehabilitation Services [phone]   Barron Schmid 07/07/2022, 12:24 PM

## 2022-07-07 NOTE — Progress Notes (Signed)
PROGRESS NOTE  Sean Bishop ELF:[id number] DOB: 11-24-1932   PCP: Moshe Cipro, NP  Patient is from: [facility].  DOA: 07/01/2022 LOS: 5  Chief complaints Chief Complaint  Patient presents with   Altered Mental Status     Brief Narrative / Interim history: 89 y.o. male with cognitive impairment, CVA, tremors, bipolar disorder, paroxysmal A-fib, dCM/systolic CHF, GERD and CKD-3A brought to ED by EMS due to confusion, ground-level fall and generalized weakness, and admitted for acute metabolic encephalopathy in the setting of COVID-19 infection, and rhabdomyolysis.  CT head raises concern for NPH.  Otherwise, encephalopathy workup unrevealing.   Encephalopathy and rhabdomyolysis resolved.  He is oriented to self, place and person which is to be his baseline.    Therapy recommended SNF.  He may have to complete isolation precaution for COVID-19 before transfer.  Medically stable for discharge.  Subjective: Seen and examined earlier this morning.  No major events overnight of this morning.  No complaints.  He is eager to go back and be with his wife.  Objective: Vitals:   07/07/22 0432 07/07/22 0500 07/07/22 0852 07/07/22 1108  BP: 131/67  (!) 140/64 135/87  Pulse: (!) 58  80 (!) 57  Resp: 17  16 16   Temp: 98.4 F (36.9 C)  98.8 F (37.1 C) 98.8 F (37.1 C)  TempSrc: Oral  Oral   SpO2: 95%  97% 96%  Weight:  93.7 kg    Height:        Examination:  GENERAL: No apparent distress.  Nontoxic. HEENT: MMM.  Vision and hearing grossly intact.  NECK: Supple.  No apparent JVD.  RESP:  No IWOB.  Fair aeration bilaterally. CVS:  RRR. Heart sounds normal.  ABD/GI/GU: BS+. Abd soft, NTND.  MSK/EXT:  Moves extremities. No apparent deformity. No edema.  SKIN: no apparent skin lesion or wound NEURO: Awake and alert. Oriented to self, person and place.  Resting tremor in right arm and leg.  No apparent focal neuro deficit. PSYCH: Calm. Normal affect.   Procedures:   None  Microbiology summarized: COVID-19, influenza and RSV PCR nonreactive.  Assessment and plan: Principal Problem:   Acute metabolic encephalopathy Active Problems:   Gait instability   Tremor   Hypertension   Atrial fibrillation, persistent (HCC)   Bipolar disorder (HCC)   Hyponatremia   Hyperbilirubinemia   Traumatic rhabdomyolysis (HCC)   Obesity (BMI 30-39.9)  Acute metabolic encephalopathy/possible delirium: Likely due to COVID-19 infection.  Seems to have underlying cognitive impairment as well.  CT head raises concern for NPH.  Ammonia, UDS, TSH and B12 within normal.  He has chronic gait abnormality.  Not on nephrotoxic meds.  Lithium level slightly low.  No focal deficit on exam.  Encephalopathy seems to have resolved.  Oriented to self, place and person which is likely his baseline. -Reorientation and delirium precautions. -Avoid sedating medications   Generalized weakness/Gait instability/ground-level fall: Per wife, he was diagnosed with Parkinson disease at some point and started on Parkinson medication that has affected his cognition.  Parkinson medication was stopped and his cognition improved.  No significant trauma or injury.  Per wife, did not hit his head or loss consciousness.  CT head without acute finding.  He has no focal neurosymptoms other than some tremors. -Fall precaution,  -PT/OT recommended SNF.  Parkinson's disease with dementia: Not on medication.  Has resting left arm and leg tremor. -Outpatient follow-up  Traumatic rhabdomyolysis: Mild.  CK trended from 1700>>3000>>> 280   COVID-19 infection:  Tested positive for 1/4.  No significant respiratory symptoms other than some cough to warrant treatment. -Continue isolation precaution.   Chronic combined CHF: TTE in 11/2021 with LVEF of 45 to 50%, indeterminate DD.  Not on diuretics.  Euvolemic.   Persistent A-fib: Rate controlled. -Continue home Cardizem and Eliquis   CKD-3A: Stable -Monitor    Hyponatremia: Resolved.   Hyperbilirubinemia: Mild and improved.   Bipolar disorder: Followed by psychiatry.  Seems like his lithium was decreased from 450 mg to 300 mg about a week ago.  Lithium level slightly low at 0.47 -Continue home lithium at 300 mg daily for now   Hypogonadism: Seems to be on testosterone injection. -Outpatient follow-up   Glaucoma -Continue eyedrops   Obesity Body mass index is 33.34 kg/m.           DVT prophylaxis:   apixaban (ELIQUIS) tablet 5 mg  Code Status: DNR/DNI Family Communication: None at bedside today. Level of care: Med-Surg Status is: Inpatient Remains inpatient appropriate because: SNF bed   Final disposition: SNF Consultants:  None  Sch Meds:  Scheduled Meds:  apixaban  5 mg Oral BID   ascorbic acid  500 mg Oral BID   diltiazem  120 mg Oral Daily   latanoprost  1 drop Both Eyes QHS   lithium carbonate  300 mg Oral QPM   multivitamin with minerals  1 tablet Oral Daily   pravastatin  20 mg Oral q1800   timolol  1 drop Both Eyes Daily   Continuous Infusions:   PRN Meds:.acetaminophen **OR** acetaminophen, ondansetron **OR** ondansetron (ZOFRAN) IV, mouth rinse  Antimicrobials: Anti-infectives (From admission, onward)    None        I have personally reviewed the following labs and images: CBC: Recent Labs  Lab 07/01/22 2346 07/03/22 0816 07/04/22 1101  WBC 12.8* 10.7* 8.9  NEUTROABS 11.2*  --   --   HGB 15.7 16.8 15.9  HCT 49.0 49.6 46.8  MCV 96.5 92.4 91.9  PLT 207 171 187   BMP &GFR Recent Labs  Lab 07/01/22 2346 07/03/22 0816 07/04/22 1101 07/05/22 0309 07/06/22 0406  NA 131* 134* 135 134* 135  K 4.2 4.4 4.1 4.2 3.8  CL 101 102 106 103 105  CO2 22 23 21* 21* 22  GLUCOSE 119* 92 113* 90 88  BUN 15 13 15 15 15   CREATININE 1.37* 1.20 1.14 1.19 1.11  CALCIUM 9.8 9.6 9.1 9.1 9.1  MG  --  1.9 1.9  --  1.9  PHOS  --  2.8  --   --  2.6   Estimated Creatinine Clearance: 50.3 mL/min (by C-G  formula based on SCr of 1.11 mg/dL). Liver & Pancreas: Recent Labs  Lab 07/01/22 2346 07/03/22 0816 07/04/22 1101 07/05/22 0309 07/06/22 0406  AST 37 137* 105* 87* 64*  ALT 23 36 38 38 38  ALKPHOS 68 56 55 57 58  BILITOT 1.9* 2.0* 1.4* 1.5* 1.5*  PROT 7.0 6.9 6.5 6.5 6.4*  ALBUMIN 3.9 3.6 3.3* 3.2* 3.1*   No results for input(s): "LIPASE", "AMYLASE" in the last 168 hours. Recent Labs  Lab 07/02/22 1132  AMMONIA 22   Diabetic: No results for input(s): "HGBA1C" in the last 72 hours. No results for input(s): "GLUCAP" in the last 168 hours. Cardiac Enzymes: Recent Labs  Lab 07/02/22 1132 07/03/22 0816 07/04/22 1101 07/05/22 0309  CKTOTAL 1,774* 3,117* 653* 279   No results for input(s): "PROBNP" in the last 8760 hours. Coagulation Profile: No  results for input(s): "INR", "PROTIME" in the last 168 hours. Thyroid Function Tests: No results for input(s): "TSH", "T4TOTAL", "FREET4", "T3FREE", "THYROIDAB" in the last 72 hours.  Lipid Profile: No results for input(s): "CHOL", "HDL", "LDLCALC", "TRIG", "CHOLHDL", "LDLDIRECT" in the last 72 hours. Anemia Panel: No results for input(s): "VITAMINB12", "FOLATE", "FERRITIN", "TIBC", "IRON", "RETICCTPCT" in the last 72 hours.  Urine analysis:    Component Value Date/Time   COLORURINE YELLOW 07/02/2022 0016   APPEARANCEUR CLEAR 07/02/2022 0016   LABSPEC 1.009 07/02/2022 0016   PHURINE 6.0 07/02/2022 0016   GLUCOSEU NEGATIVE 07/02/2022 0016   HGBUR NEGATIVE 07/02/2022 0016   BILIRUBINUR NEGATIVE 07/02/2022 0016   KETONESUR 5 (A) 07/02/2022 0016   PROTEINUR 30 (A) 07/02/2022 0016   UROBILINOGEN 1.0 01/21/2009 0005   NITRITE NEGATIVE 07/02/2022 0016   LEUKOCYTESUR NEGATIVE 07/02/2022 0016   Sepsis Labs: Invalid input(s): "PROCALCITONIN", "LACTICIDVEN"  Microbiology: Recent Results (from the past 240 hour(s))  Resp panel by RT-PCR (RSV, Flu A&B, Covid) Anterior Nasal Swab     Status: Abnormal   Collection Time: 07/02/22   4:00 AM   Specimen: Anterior Nasal Swab  Result Value Ref Range Status   SARS Coronavirus 2 by RT PCR POSITIVE (A) NEGATIVE Final    Comment: (NOTE) SARS-CoV-2 target nucleic acids are DETECTED.  The SARS-CoV-2 RNA is generally detectable in upper respiratory specimens during the acute phase of infection. Positive results are indicative of the presence of the identified virus, but do not rule out bacterial infection or co-infection with other pathogens not detected by the test. Clinical correlation with patient history and other diagnostic information is necessary to determine patient infection status. The expected result is Negative.  Fact Sheet for Patients: EntrepreneurPulse.com.au  Fact Sheet for Healthcare Providers: IncredibleEmployment.be  This test is not yet approved or cleared by the [place] FDA and  has been authorized for detection and/or diagnosis of SARS-CoV-2 by FDA under an Emergency Use Authorization (EUA).  This EUA will remain in effect (meaning this test can be used) for the duration of  the COVID-19 declaration under Section 564(b)(1) of the A ct, 21 U.S.C. section 360bbb-3(b)(1), unless the authorization is terminated or revoked sooner.     Influenza A by PCR NEGATIVE NEGATIVE Final   Influenza B by PCR NEGATIVE NEGATIVE Final    Comment: (NOTE) The Xpert Xpress SARS-CoV-2/FLU/RSV plus assay is intended as an aid in the diagnosis of influenza from Nasopharyngeal swab specimens and should not be used as a sole basis for treatment. Nasal washings and aspirates are unacceptable for Xpert Xpress SARS-CoV-2/FLU/RSV testing.  Fact Sheet for Patients: EntrepreneurPulse.com.au  Fact Sheet for Healthcare Providers: IncredibleEmployment.be  This test is not yet approved or cleared by the [place] FDA and has been authorized for detection and/or diagnosis of SARS-CoV-2 by FDA  under an Emergency Use Authorization (EUA). This EUA will remain in effect (meaning this test can be used) for the duration of the COVID-19 declaration under Section 564(b)(1) of the Act, 21 U.S.C. section 360bbb-3(b)(1), unless the authorization is terminated or revoked.     Resp Syncytial Virus by PCR NEGATIVE NEGATIVE Final    Comment: (NOTE) Fact Sheet for Patients: EntrepreneurPulse.com.au  Fact Sheet for Healthcare Providers: IncredibleEmployment.be  This test is not yet approved or cleared by the [place] FDA and has been authorized for detection and/or diagnosis of SARS-CoV-2 by FDA under an Emergency Use Authorization (EUA). This EUA will remain in effect (meaning this test can be used) for the  duration of the COVID-19 declaration under Section 564(b)(1) of the Act, 21 U.S.C. section 360bbb-3(b)(1), unless the authorization is terminated or revoked.  Performed at [facility], [address], [place], [place] [address]     Radiology Studies: No results found.    Magdaleno Lortie T. Elnoria Livingston [facility]  If 7PM-7AM, please contact night-coverage [email or web address] 07/07/2022, 2:43 PM

## 2022-07-08 DIAGNOSIS — R2681 Unsteadiness on feet: Secondary | ICD-10-CM | POA: Diagnosis not present

## 2022-07-08 DIAGNOSIS — M2550 Pain in unspecified joint: Secondary | ICD-10-CM | POA: Diagnosis not present

## 2022-07-08 DIAGNOSIS — I4821 Permanent atrial fibrillation: Secondary | ICD-10-CM | POA: Diagnosis not present

## 2022-07-08 DIAGNOSIS — R4189 Other symptoms and signs involving cognitive functions and awareness: Secondary | ICD-10-CM | POA: Diagnosis not present

## 2022-07-08 DIAGNOSIS — N1831 Chronic kidney disease, stage 3a: Secondary | ICD-10-CM | POA: Diagnosis not present

## 2022-07-08 DIAGNOSIS — K219 Gastro-esophageal reflux disease without esophagitis: Secondary | ICD-10-CM | POA: Diagnosis not present

## 2022-07-08 DIAGNOSIS — I5042 Chronic combined systolic (congestive) and diastolic (congestive) heart failure: Secondary | ICD-10-CM | POA: Diagnosis not present

## 2022-07-08 DIAGNOSIS — T796XXA Traumatic ischemia of muscle, initial encounter: Secondary | ICD-10-CM | POA: Diagnosis not present

## 2022-07-08 DIAGNOSIS — Z66 Do not resuscitate: Secondary | ICD-10-CM | POA: Insufficient documentation

## 2022-07-08 DIAGNOSIS — U071 COVID-19: Secondary | ICD-10-CM | POA: Diagnosis not present

## 2022-07-08 DIAGNOSIS — I480 Paroxysmal atrial fibrillation: Secondary | ICD-10-CM | POA: Diagnosis not present

## 2022-07-08 DIAGNOSIS — H409 Unspecified glaucoma: Secondary | ICD-10-CM | POA: Diagnosis not present

## 2022-07-08 DIAGNOSIS — I4819 Other persistent atrial fibrillation: Secondary | ICD-10-CM | POA: Diagnosis not present

## 2022-07-08 DIAGNOSIS — R251 Tremor, unspecified: Secondary | ICD-10-CM | POA: Diagnosis not present

## 2022-07-08 DIAGNOSIS — E291 Testicular hypofunction: Secondary | ICD-10-CM | POA: Diagnosis not present

## 2022-07-08 DIAGNOSIS — E669 Obesity, unspecified: Secondary | ICD-10-CM | POA: Diagnosis not present

## 2022-07-08 DIAGNOSIS — G9341 Metabolic encephalopathy: Secondary | ICD-10-CM | POA: Diagnosis not present

## 2022-07-08 DIAGNOSIS — R1311 Dysphagia, oral phase: Secondary | ICD-10-CM | POA: Diagnosis not present

## 2022-07-08 DIAGNOSIS — F319 Bipolar disorder, unspecified: Secondary | ICD-10-CM | POA: Diagnosis not present

## 2022-07-08 DIAGNOSIS — Z7401 Bed confinement status: Secondary | ICD-10-CM | POA: Diagnosis not present

## 2022-07-08 DIAGNOSIS — M6281 Muscle weakness (generalized): Secondary | ICD-10-CM | POA: Diagnosis not present

## 2022-07-08 NOTE — Progress Notes (Signed)
Report called to Vivien Rota at [facility].

## 2022-07-08 NOTE — Care Management Important Message (Signed)
Important Message  Patient Details  Name: Sean Bishop MRN: [id number] Date of Birth: 1933-04-05   Medicare Important Message Given:  Other (see comment)  Spoke with the patient home address at the wifes request.    Orbie Pyo 07/08/2022, 1:47 PM

## 2022-07-08 NOTE — Discharge Summary (Signed)
Physician Discharge Summary  Sean Bishop:[id number] DOB: [birth year] DOA: 07/01/2022  PCP: Faustino Congress, NP  Admit date: 07/01/2022 Discharge date: 07/08/2022 Admitted From: ILF Disposition: ILF Recommendations for Outpatient Follow-up:  Follow up with PCP in 1 to 2 weeks Recommend follow-up with neurology about tremor and possible Parkinson disease Check CMP and CBC in 1 week Please follow up on the following pending results: None  Home Health: PT/OT Equipment/Devices: None  Discharge Condition: Stable CODE STATUS: DNR/DNI   Hospital course 89 y.o. male with cognitive impairment, CVA, tremors, bipolar disorder, paroxysmal A-fib, dCM/systolic CHF, GERD and NFA-2Z brought to ED by EMS due to confusion, ground-level fall and generalized weakness, and admitted for acute metabolic encephalopathy in the setting of COVID-19 infection, and rhabdomyolysis.  CT head raises concern for NPH.  Otherwise, encephalopathy workup unrevealing.    Encephalopathy and rhabdomyolysis resolved.  He is oriented to self, place and person which is to be his baseline.     Therapy recommended SNF.  However, patient prefers to go back to ILF with home meals to be with his wife who also tested positive for COVID-19.  HH PT/OT ordered on discharge.  See individual problem list below for more.   Problems addressed during this hospitalization Principal Problem:   Acute metabolic encephalopathy Active Problems:   Gait instability   Tremor   Hypertension   Atrial fibrillation, persistent (HCC)   Bipolar disorder (HCC)   Hyponatremia   Hyperbilirubinemia   Traumatic rhabdomyolysis (HCC)   Obesity (BMI 30-39.9)   Acute metabolic encephalopathy/possible delirium: Likely due to COVID-19 infection.  Seems to have underlying cognitive impairment as well.  CT head raises concern for NPH.  Ammonia, UDS, TSH and B12 within normal.  He has chronic gait abnormality.  Not on nephrotoxic meds.  Lithium level  slightly low.  No focal deficit on exam.  Encephalopathy seems to have resolved.  Oriented to self, place and person which is likely his baseline. -Reorientation and delirium precautions. -Avoid sedating medications   Generalized weakness/Gait instability/ground-level fall: Per wife, he was diagnosed with Parkinson disease at some point and started on Parkinson medication that has affected his cognition.  Parkinson medication was stopped and his cognition improved.  No significant trauma or injury.  Per wife, did not hit his head or loss consciousness.  CT head without acute finding.  He has no focal neurosymptoms other than some tremors. -Fall precaution,  -Continue PT/OT -Follow-up with neurology   Parkinson's disease with dementia: Not on medication.  Has resting left arm and leg tremor. -Outpatient follow-up with neurology   Traumatic rhabdomyolysis: Mild.  CK trended from 1700>>3000>>> 280   COVID-19 infection: Tested positive for 1/4.  No significant respiratory symptoms other than some cough to warrant treatment. -Continue isolation precaution 07/12/2022.    Chronic combined CHF: TTE in 11/2021 with LVEF of 45 to 50%, indeterminate DD.  Not on diuretics.  Euvolemic.   Persistent A-fib: Rate controlled. -Continue home Cardizem and Eliquis   CKD-3A: Stable -Check CMP in 1 week   Hyponatremia: Resolved.   Hyperbilirubinemia: Mild and improved.   Bipolar disorder: Followed by psychiatry.  Seems like his lithium was decreased from 450 mg to 300 mg about a week ago.  Lithium level slightly low at 0.47 -Continue home lithium at 300 mg daily for now -Follow-up with psychiatry as previously planned   Hypogonadism: Seems to be on testosterone injection. -Outpatient follow-up   Glaucoma -Continue eyedrops   Obesity Body mass index is  33.34 kg/m.            Vital signs Vitals:   07/07/22 2316 07/08/22 0137 07/08/22 0318 07/08/22 0739  BP: (!) 175/152 (!) 144/75 (!)  143/82 138/78  Pulse: 77 66 72 69  Temp: 99.6 F (37.6 C)  (!) 97.5 F (36.4 C) 98.8 F (37.1 C)  Resp:    18  Height:      Weight:      SpO2: 98%  97% 98%  TempSrc: Oral  Oral Oral  BMI (Calculated):         Discharge exam  GENERAL: No apparent distress.  Nontoxic. HEENT: MMM.  Vision and hearing grossly intact.  NECK: Supple.  No apparent JVD.  RESP:  No IWOB.  Fair aeration bilaterally. CVS:  RRR. Heart sounds normal.  ABD/GI/GU: BS+. Abd soft, NTND.  MSK/EXT:  Moves extremities. No apparent deformity. No edema.  SKIN: no apparent skin lesion or wound NEURO: Awake and alert. Oriented to self, person, place and month.  Right arm and leg resting tremor.  No apparent focal neuro deficit. PSYCH: Calm. Normal affect.   Discharge Instructions Discharge Instructions     Diet general   Complete by: As directed    Increase activity slowly   Complete by: As directed       Allergies as of 07/08/2022       Reactions   Lipitor [atorvastatin] Other (See Comments)   Joint soreness        Medication List     TAKE these medications    acetaminophen 500 MG tablet Commonly known as: TYLENOL Take 500 mg by mouth 2 (two) times daily.   diltiazem 120 MG 24 hr capsule Commonly known as: CARDIZEM CD TAKE ONE CAPSULE BY MOUTH DAILY   Eliquis 5 MG Tabs tablet Generic drug: apixaban TAKE 1 TABLET BY MOUTH TWICE DAILY. What changed: how much to take   ibandronate 150 MG tablet Commonly known as: BONIVA Take 150 mg by mouth every 30 (thirty) days.   latanoprost 0.005 % ophthalmic solution Commonly known as: XALATAN Place 1 drop into both eyes at bedtime.   lithium carbonate 300 MG ER tablet Commonly known as: LITHOBID Take 1 tablet (300 mg total) by mouth every evening.   multivitamin with minerals Tabs tablet Take 1 tablet by mouth daily.   pravastatin 20 MG tablet Commonly known as: PRAVACHOL Take 1 tablet (20 mg total) by mouth every evening.   testosterone  cypionate 200 MG/ML injection Commonly known as: DEPOTESTOSTERONE CYPIONATE Inject 200 mg into the muscle every 21 ( twenty-one) days. Every 3 weeks   timolol 0.5 % ophthalmic solution Commonly known as: TIMOPTIC Place 1 drop into both eyes daily.   VITAMIN C PO Take 1 tablet by mouth daily.        Consultations: None  Procedures/Studies:   CT Head Wo Contrast  Result Date: 07/01/2022 CLINICAL DATA:  Head and neck trauma. EXAM: CT HEAD WITHOUT CONTRAST CT CERVICAL SPINE WITHOUT CONTRAST TECHNIQUE: Multidetector CT imaging of the head and cervical spine was performed following the standard protocol without intravenous contrast. Multiplanar CT image reconstructions of the cervical spine were also generated. RADIATION DOSE REDUCTION: This exam was performed according to the departmental dose-optimization program which includes automated exposure control, adjustment of the mA and/or kV according to patient size and/or use of iterative reconstruction technique. COMPARISON:  CT examination dated February 16, 2018 FINDINGS: CT HEAD FINDINGS Brain: No evidence of acute infarction, hemorrhage, extra-axial collection or mass  lesion/mass effect. Prominence of the ventricles and sulci secondary to moderate cerebral volume loss. Diffuse low-attenuation of the periventricular white matter presumed advanced chronic microvascular ischemic changes of the white matter. Prominence of the ventricles disproportionate to the volume loss concerning for normal pressure hydrocephalus. Vascular: No hyperdense vessel or unexpected calcification. Skull: Normal. Negative for fracture or focal lesion. Sinuses/Orbits: No acute finding. Other: None. CT CERVICAL SPINE FINDINGS Alignment: Straightening of the cervical spine. Skull base and vertebrae: No acute fracture. No primary bone lesion or focal pathologic process. Soft tissues and spinal canal: No prevertebral fluid or swelling. No visible canal hematoma. Disc levels:  Multilevel degenerate disc disease with disc height loss and marginal osteophytes with associated uncovertebral joint and facet joint arthropathy. C2-C3:  No significant findings. C3-C4: Disc height loss and uncovertebral joint arthropathy with mild right and moderate left neural foraminal stenosis. Mild left facet joint arthropathy. C4-C5: Disc height loss and marked uncovertebral joint arthropathy with moderate-to-severe bilateral neural foraminal stenosis. C5-C6: Disc height loss and uncovertebral joint arthropathy with moderate right and severe left neural foraminal stenosis. C6-C7: Disc height loss and uncovertebral joint arthropathy with mild left neural foraminal stenosis. C7-T1: Disc height loss and facet joint arthropathy. No significant neural foraminal stenosis. Upper chest: Negative. Other: None IMPRESSION: CT HEAD: 1. No acute intracranial abnormality. 2. Moderate cerebral volume loss and advanced chronic microvascular ischemic changes of the white matter. 3. Prominence of the ventricles more than expected for the cerebral volume loss concerning for normal pressure hydrocephalus. CT CERVICAL SPINE: 1. No acute fracture or traumatic subluxation. 2. Multilevel degenerate disc disease with associated uncovertebral joint and facet joint arthropathy. Electronically Signed   By: Keane Police D.O.   On: 07/01/2022 23:25   CT Cervical Spine Wo Contrast  Result Date: 07/01/2022 CLINICAL DATA:  Head and neck trauma. EXAM: CT HEAD WITHOUT CONTRAST CT CERVICAL SPINE WITHOUT CONTRAST TECHNIQUE: Multidetector CT imaging of the head and cervical spine was performed following the standard protocol without intravenous contrast. Multiplanar CT image reconstructions of the cervical spine were also generated. RADIATION DOSE REDUCTION: This exam was performed according to the departmental dose-optimization program which includes automated exposure control, adjustment of the mA and/or kV according to patient size and/or  use of iterative reconstruction technique. COMPARISON:  CT examination dated February 16, 2018 FINDINGS: CT HEAD FINDINGS Brain: No evidence of acute infarction, hemorrhage, extra-axial collection or mass lesion/mass effect. Prominence of the ventricles and sulci secondary to moderate cerebral volume loss. Diffuse low-attenuation of the periventricular white matter presumed advanced chronic microvascular ischemic changes of the white matter. Prominence of the ventricles disproportionate to the volume loss concerning for normal pressure hydrocephalus. Vascular: No hyperdense vessel or unexpected calcification. Skull: Normal. Negative for fracture or focal lesion. Sinuses/Orbits: No acute finding. Other: None. CT CERVICAL SPINE FINDINGS Alignment: Straightening of the cervical spine. Skull base and vertebrae: No acute fracture. No primary bone lesion or focal pathologic process. Soft tissues and spinal canal: No prevertebral fluid or swelling. No visible canal hematoma. Disc levels: Multilevel degenerate disc disease with disc height loss and marginal osteophytes with associated uncovertebral joint and facet joint arthropathy. C2-C3:  No significant findings. C3-C4: Disc height loss and uncovertebral joint arthropathy with mild right and moderate left neural foraminal stenosis. Mild left facet joint arthropathy. C4-C5: Disc height loss and marked uncovertebral joint arthropathy with moderate-to-severe bilateral neural foraminal stenosis. C5-C6: Disc height loss and uncovertebral joint arthropathy with moderate right and severe left neural foraminal stenosis. C6-C7: Disc height  loss and uncovertebral joint arthropathy with mild left neural foraminal stenosis. C7-T1: Disc height loss and facet joint arthropathy. No significant neural foraminal stenosis. Upper chest: Negative. Other: None IMPRESSION: CT HEAD: 1. No acute intracranial abnormality. 2. Moderate cerebral volume loss and advanced chronic microvascular ischemic  changes of the white matter. 3. Prominence of the ventricles more than expected for the cerebral volume loss concerning for normal pressure hydrocephalus. CT CERVICAL SPINE: 1. No acute fracture or traumatic subluxation. 2. Multilevel degenerate disc disease with associated uncovertebral joint and facet joint arthropathy. Electronically Signed   By: Larose Hires D.O.   On: 07/01/2022 23:25   DG Chest 1 View  Result Date: 07/01/2022 CLINICAL DATA:  Altered mental status. EXAM: CHEST  1 VIEW COMPARISON:  Chest x-ray 09/06/2013 FINDINGS: The heart and mediastinal contours are unchanged. Aortic calcification. No focal consolidation. No pulmonary edema. No pleural effusion. No pneumothorax. No acute osseous abnormality. Severe degenerative changes of the bilateral shoulders. IMPRESSION: No active disease. Electronically Signed   By: Tish Frederickson M.D.   On: 07/01/2022 22:44       The results of significant diagnostics from this hospitalization (including imaging, microbiology, ancillary and laboratory) are listed below for reference.     Microbiology: Recent Results (from the past 240 hour(s))  Resp panel by RT-PCR (RSV, Flu A&B, Covid) Anterior Nasal Swab     Status: Abnormal   Collection Time: 07/02/22  4:00 AM   Specimen: Anterior Nasal Swab  Result Value Ref Range Status   SARS Coronavirus 2 by RT PCR POSITIVE (A) NEGATIVE Final    Comment: (NOTE) SARS-CoV-2 target nucleic acids are DETECTED.  The SARS-CoV-2 RNA is generally detectable in upper respiratory specimens during the acute phase of infection. Positive results are indicative of the presence of the identified virus, but do not rule out bacterial infection or co-infection with other pathogens not detected by the test. Clinical correlation with patient history and other diagnostic information is necessary to determine patient infection status. The expected result is Negative.  Fact Sheet for  Patients: BloggerCourse.com  Fact Sheet for Healthcare Providers: SeriousBroker.it  This test is not yet approved or cleared by the [place] FDA and  has been authorized for detection and/or diagnosis of SARS-CoV-2 by FDA under an Emergency Use Authorization (EUA).  This EUA will remain in effect (meaning this test can be used) for the duration of  the COVID-19 declaration under Section 564(b)(1) of the A ct, 21 U.S.C. section 360bbb-3(b)(1), unless the authorization is terminated or revoked sooner.     Influenza A by PCR NEGATIVE NEGATIVE Final   Influenza B by PCR NEGATIVE NEGATIVE Final    Comment: (NOTE) The Xpert Xpress SARS-CoV-2/FLU/RSV plus assay is intended as an aid in the diagnosis of influenza from Nasopharyngeal swab specimens and should not be used as a sole basis for treatment. Nasal washings and aspirates are unacceptable for Xpert Xpress SARS-CoV-2/FLU/RSV testing.  Fact Sheet for Patients: BloggerCourse.com  Fact Sheet for Healthcare Providers: SeriousBroker.it  This test is not yet approved or cleared by the [place] FDA and has been authorized for detection and/or diagnosis of SARS-CoV-2 by FDA under an Emergency Use Authorization (EUA). This EUA will remain in effect (meaning this test can be used) for the duration of the COVID-19 declaration under Section 564(b)(1) of the Act, 21 U.S.C. section 360bbb-3(b)(1), unless the authorization is terminated or revoked.     Resp Syncytial Virus by PCR NEGATIVE NEGATIVE Final    Comment: (NOTE)  Fact Sheet for Patients: BloggerCourse.com  Fact Sheet for Healthcare Providers: SeriousBroker.it  This test is not yet approved or cleared by the [place] FDA and has been authorized for detection and/or diagnosis of SARS-CoV-2 by FDA under an Emergency Use  Authorization (EUA). This EUA will remain in effect (meaning this test can be used) for the duration of the COVID-19 declaration under Section 564(b)(1) of the Act, 21 U.S.C. section 360bbb-3(b)(1), unless the authorization is terminated or revoked.  Performed at [facility], [address] [place], [place] [address]      Labs:  CBC: Recent Labs  Lab 07/01/22 2346 07/03/22 0816 07/04/22 1101  WBC 12.8* 10.7* 8.9  NEUTROABS 11.2*  --   --   HGB 15.7 16.8 15.9  HCT 49.0 49.6 46.8  MCV 96.5 92.4 91.9  PLT 207 171 187   BMP &GFR Recent Labs  Lab 07/01/22 2346 07/03/22 0816 07/04/22 1101 07/05/22 0309 07/06/22 0406  NA 131* 134* 135 134* 135  K 4.2 4.4 4.1 4.2 3.8  CL 101 102 106 103 105  CO2 22 23 21* 21* 22  GLUCOSE 119* 92 113* 90 88  BUN 15 13 15 15 15   CREATININE 1.37* 1.20 1.14 1.19 1.11  CALCIUM 9.8 9.6 9.1 9.1 9.1  MG  --  1.9 1.9  --  1.9  PHOS  --  2.8  --   --  2.6   Estimated Creatinine Clearance: 50.3 mL/min (by C-G formula based on SCr of 1.11 mg/dL). Liver & Pancreas: Recent Labs  Lab 07/01/22 2346 07/03/22 0816 07/04/22 1101 07/05/22 0309 07/06/22 0406  AST 37 137* 105* 87* 64*  ALT 23 36 38 38 38  ALKPHOS 68 56 55 57 58  BILITOT 1.9* 2.0* 1.4* 1.5* 1.5*  PROT 7.0 6.9 6.5 6.5 6.4*  ALBUMIN 3.9 3.6 3.3* 3.2* 3.1*   No results for input(s): "LIPASE", "AMYLASE" in the last 168 hours. Recent Labs  Lab 07/02/22 1132  AMMONIA 22   Diabetic: No results for input(s): "HGBA1C" in the last 72 hours. No results for input(s): "GLUCAP" in the last 168 hours. Cardiac Enzymes: Recent Labs  Lab 07/02/22 1132 07/03/22 0816 07/04/22 1101 07/05/22 0309  CKTOTAL 1,774* 3,117* 653* 279   No results for input(s): "PROBNP" in the last 8760 hours. Coagulation Profile: No results for input(s): "INR", "PROTIME" in the last 168 hours. Thyroid Function Tests: No results for input(s): "TSH", "T4TOTAL", "FREET4", "T3FREE", "THYROIDAB" in the  last 72 hours. Lipid Profile: No results for input(s): "CHOL", "HDL", "LDLCALC", "TRIG", "CHOLHDL", "LDLDIRECT" in the last 72 hours. Anemia Panel: No results for input(s): "VITAMINB12", "FOLATE", "FERRITIN", "TIBC", "IRON", "RETICCTPCT" in the last 72 hours. Urine analysis:    Component Value Date/Time   COLORURINE YELLOW 07/02/2022 0016   APPEARANCEUR CLEAR 07/02/2022 0016   LABSPEC 1.009 07/02/2022 0016   PHURINE 6.0 07/02/2022 0016   GLUCOSEU NEGATIVE 07/02/2022 0016   HGBUR NEGATIVE 07/02/2022 0016   BILIRUBINUR NEGATIVE 07/02/2022 0016   KETONESUR 5 (A) 07/02/2022 0016   PROTEINUR 30 (A) 07/02/2022 0016   UROBILINOGEN 1.0 01/21/2009 0005   NITRITE NEGATIVE 07/02/2022 0016   LEUKOCYTESUR NEGATIVE 07/02/2022 0016   Sepsis Labs: Invalid input(s): "PROCALCITONIN", "LACTICIDVEN"   SIGNED:  08/31/2022, MD  [facility] 07/08/2022, 11:22 AM

## 2022-07-08 NOTE — TOC Transition Note (Signed)
Transition of Care [facility]) - CM/SW Discharge Note   Patient Details  Name: Sean Bishop MRN: [id number] Date of Birth: July 25, 1932  Transition of Care [facility]) CM/SW Contact:  Jinger Neighbors, LCSW Phone Number: 07/08/2022, 11:24 AM   Clinical Narrative:     Pt going to [facility].  Call to report: [phone] ext: [place] 18  Final next level of care: [place] Barriers to Discharge: No Barriers Identified   Patient Goals and CMS Choice CMS Medicare.gov Compare Post Acute Care list provided to:: Patient Choice offered to / list presented to : Spouse, Patient  Discharge Placement                Patient chooses bed at: [facility] Patient to be transferred to facility by: [place] Name of family member notified: Mrs. Satter- left voicemail Patient and family notified of of transfer: 07/08/22  Discharge Plan and Services Additional resources added to the After Visit Summary for       Post Acute Care Choice: [place]                               Social Determinants of Health (SDOH) Interventions SDOH Screenings   Food Insecurity: No Food Insecurity (07/02/2022)  Housing: Low Risk  (07/02/2022)  Transportation Needs: No Transportation Needs (07/02/2022)  Utilities: Not At Risk (07/02/2022)  Tobacco Use: Medium Risk (07/01/2022)     Readmission Risk Interventions     No data to display

## 2022-07-08 NOTE — Plan of Care (Signed)

## 2022-07-08 NOTE — TOC Progression Note (Signed)
Transition of Care [facility]) - Progression Note    Patient Details  Name: Sean Bishop MRN: [id number] Date of Birth: 1932-12-27  Transition of Care [facility]) CM/SW Contact  Jinger Neighbors, [place] Phone Number: 07/08/2022, 10:11 AM  Clinical Narrative:     CSW attempted to call Katie at [facility] and left a voicemail. CSW received a returned call and Joellen [place] reports patient can return. CSW will proceed with discharge.   Expected Discharge Plan: [place] Barriers to Discharge: Continued Medical Work up, Ship broker, SNF Pending bed offer  Expected Discharge Plan and [place] Choice: [place] arrangements for the past 2 months: [place]                                       Social Determinants of Health (SDOH) Interventions SDOH Screenings   Food Insecurity: No Food Insecurity (07/02/2022)  Housing: Low Risk  (07/02/2022)  Transportation Needs: No Transportation Needs (07/02/2022)  Utilities: Not At Risk (07/02/2022)  Tobacco Use: Medium Risk (07/01/2022)    Readmission Risk Interventions     No data to display

## 2022-07-08 NOTE — Plan of Care (Signed)
  Problem: Education: Goal: Knowledge of General Education information will improve Description: Including pain rating scale, medication(s)/side effects and non-pharmacologic comfort measures 07/08/2022 1148 by Chipper Koudelka, Jerilynn Mages, RN Outcome: Adequate for Discharge 07/08/2022 1148 by Meztli Llanas, Jerilynn Mages, RN Outcome: Progressing   Problem: Health Behavior/Discharge Planning: Goal: Ability to manage health-related needs will improve 07/08/2022 1148 by Deannah Rossi, Jerilynn Mages, RN Outcome: Adequate for Discharge 07/08/2022 1148 by Levell July, RN Outcome: Progressing   Problem: Clinical Measurements: Goal: Ability to maintain clinical measurements within normal limits will improve 07/08/2022 1148 by Haralambos Yeatts, Jerilynn Mages, RN Outcome: Adequate for Discharge 07/08/2022 1148 by Levell July, RN Outcome: Progressing Goal: Will remain free from infection 07/08/2022 1148 by Lashonne Shull, Jerilynn Mages, RN Outcome: Adequate for Discharge 07/08/2022 1148 by Levell July, RN Outcome: Progressing Goal: Diagnostic test results will improve 07/08/2022 1148 by Jermani Eberlein, Jerilynn Mages, RN Outcome: Adequate for Discharge 07/08/2022 1148 by Levell July, RN Outcome: Progressing Goal: Respiratory complications will improve 07/08/2022 1148 by Guenther Dunshee, Jerilynn Mages, RN Outcome: Adequate for Discharge 07/08/2022 1148 by Levell July, RN Outcome: Progressing Goal: Cardiovascular complication will be avoided 07/08/2022 1148 by Sumayah Bearse, Jerilynn Mages, RN Outcome: Adequate for Discharge 07/08/2022 1148 by Kavaughn Faucett, Jerilynn Mages, RN Outcome: Progressing   Problem: Activity: Goal: Risk for activity intolerance will decrease 07/08/2022 1148 by Larell Baney, Jerilynn Mages, RN Outcome: Adequate for Discharge 07/08/2022 1148 by Verlinda Slotnick, Jerilynn Mages, RN Outcome: Progressing   Problem: Nutrition: Goal: Adequate nutrition will be maintained 07/08/2022 1148 by Kahlen Morais, Jerilynn Mages, RN Outcome: Adequate for  Discharge 07/08/2022 1148 by Levell July, RN Outcome: Progressing   Problem: Coping: Goal: Level of anxiety will decrease 07/08/2022 1148 by Le Ferraz, Jerilynn Mages, RN Outcome: Adequate for Discharge 07/08/2022 1148 by Levell July, RN Outcome: Progressing   Problem: Elimination: Goal: Will not experience complications related to bowel motility 07/08/2022 1148 by Levell July, RN Outcome: Adequate for Discharge 07/08/2022 1148 by Levell July, RN Outcome: Progressing Goal: Will not experience complications related to urinary retention 07/08/2022 1148 by Jeaninne Lodico, Jerilynn Mages, RN Outcome: Adequate for Discharge 07/08/2022 1148 by Erez Mccallum, Jerilynn Mages, RN Outcome: Progressing   Problem: Pain Managment: Goal: General experience of comfort will improve 07/08/2022 1148 by Taryn Shellhammer, Jerilynn Mages, RN Outcome: Adequate for Discharge 07/08/2022 1148 by Levell July, RN Outcome: Progressing   Problem: Safety: Goal: Ability to remain free from injury will improve 07/08/2022 1148 by Malikhi Ogan, Jerilynn Mages, RN Outcome: Adequate for Discharge 07/08/2022 1148 by Levell July, RN Outcome: Progressing   Problem: Skin Integrity: Goal: Risk for impaired skin integrity will decrease 07/08/2022 1148 by Levell July, RN Outcome: Adequate for Discharge 07/08/2022 1148 by Shekina Cordell, Jerilynn Mages, RN Outcome: Progressing

## 2022-07-10 ENCOUNTER — Encounter: Payer: Self-pay | Admitting: Nurse Practitioner

## 2022-07-10 ENCOUNTER — Non-Acute Institutional Stay (SKILLED_NURSING_FACILITY): Payer: Medicare Other | Admitting: Nurse Practitioner

## 2022-07-10 DIAGNOSIS — R4189 Other symptoms and signs involving cognitive functions and awareness: Secondary | ICD-10-CM

## 2022-07-10 DIAGNOSIS — N1831 Chronic kidney disease, stage 3a: Secondary | ICD-10-CM

## 2022-07-10 DIAGNOSIS — R2681 Unsteadiness on feet: Secondary | ICD-10-CM

## 2022-07-10 DIAGNOSIS — K219 Gastro-esophageal reflux disease without esophagitis: Secondary | ICD-10-CM

## 2022-07-10 DIAGNOSIS — F319 Bipolar disorder, unspecified: Secondary | ICD-10-CM

## 2022-07-10 DIAGNOSIS — I4819 Other persistent atrial fibrillation: Secondary | ICD-10-CM

## 2022-07-10 DIAGNOSIS — R251 Tremor, unspecified: Secondary | ICD-10-CM | POA: Diagnosis not present

## 2022-07-10 DIAGNOSIS — H409 Unspecified glaucoma: Secondary | ICD-10-CM | POA: Insufficient documentation

## 2022-07-10 DIAGNOSIS — E291 Testicular hypofunction: Secondary | ICD-10-CM

## 2022-07-10 DIAGNOSIS — N183 Chronic kidney disease, stage 3 unspecified: Secondary | ICD-10-CM | POA: Insufficient documentation

## 2022-07-10 DIAGNOSIS — F0390 Unspecified dementia without behavioral disturbance: Secondary | ICD-10-CM | POA: Insufficient documentation

## 2022-07-10 DIAGNOSIS — I509 Heart failure, unspecified: Secondary | ICD-10-CM | POA: Insufficient documentation

## 2022-07-10 DIAGNOSIS — I5042 Chronic combined systolic (congestive) and diastolic (congestive) heart failure: Secondary | ICD-10-CM

## 2022-07-10 DIAGNOSIS — E785 Hyperlipidemia, unspecified: Secondary | ICD-10-CM

## 2022-07-10 DIAGNOSIS — E871 Hypo-osmolality and hyponatremia: Secondary | ICD-10-CM

## 2022-07-10 DIAGNOSIS — G9341 Metabolic encephalopathy: Secondary | ICD-10-CM | POA: Diagnosis not present

## 2022-07-10 DIAGNOSIS — G912 (Idiopathic) normal pressure hydrocephalus: Secondary | ICD-10-CM

## 2022-07-10 NOTE — Assessment & Plan Note (Signed)
His mood is stable, followed by psychiatry, on Lithium, TSH 0.953 07/12/22

## 2022-07-10 NOTE — Assessment & Plan Note (Signed)
Bun/creat 15/1.11 07/06/22

## 2022-07-10 NOTE — Assessment & Plan Note (Signed)
Not taking acid reducer, Hgb 15.9 07/04/22

## 2022-07-10 NOTE — Assessment & Plan Note (Signed)
Obtain MMSE ?

## 2022-07-10 NOTE — Assessment & Plan Note (Signed)
bilirubin 1.5<<2.0 07/03/22

## 2022-07-10 NOTE — Assessment & Plan Note (Signed)
takes testosterone inj

## 2022-07-10 NOTE — Progress Notes (Signed)
Location:  [facility] Room Number: NO/18/A Place of Service:  SNF (31) Provider:  Adriann Ballweg X, NP  Patient Care Team: Moshe Cipro, NP as PCP - General (Family Medicine) Georgeanna Lea, MD as PCP - Cardiology (Cardiology)  Extended Emergency Contact Information Primary Emergency Contact: Hoffmeier,[name] Address: [phone]           [facility] [address]          [place], [place] [address] [name] Home Phone: [phone] Mobile Phone: [phone] Relation: Spouse Secondary Emergency Contact: [name] Address: [address].          [place], [place] [address] [place] Home Phone: [phone] Mobile Phone: [phone] Relation: Son  Code Status:  DNR Goals of care: Advanced Directive information    07/10/2022   11:13 AM  Advanced Directives  Does Patient Have a Medical Advance Directive? Yes  Type of Advance Directive Living will;Healthcare Power of [place];Out of facility DNR (pink MOST or yellow form)  Does patient want to make changes to medical advance directive? No - Patient declined  Copy of Healthcare Power of Attorney in Chart? No - copy requested  Would patient like information on creating a medical advance directive? No - Patient declined     Chief Complaint  Patient presents with   Acute Visit    Patient is here for medication review   Quality Metric Gaps    Patient is due for AWV   Immunizations    Patient is due for updated Tdap,covid vaccine, and pneumonia, discuss need for shingrix vaccine    HPI:  Pt is a 89 y.o. male seen today for an acute visit for medication review following hospital stay.    Hospitalized 07/01/22-07/08/22 for fall, COVID 19, and metabolic encephalopathy, CT head ? NPH, his mentation has returned to baseline upon dc,  recommended to f/u neurology, no apparent tremor noted today, f/u CBC, CMP one week per dc.   Parkinson's disease, stopped  parkinson's medication because of affected his cognition, had resting tremor in left arm/leg in the past  Cognitive impairment  CVA, no focal weakness residual.   Afib, on Diltiazem, Eliquis  Bipolar disorder, followed by psychiatry, on Lithium, TSH 0.953 07/12/22  CHF, EF 45-50% 11/2021, not on diuretics, trace edema BLE  Gait abnormality, uses walker  Hyponatremia, Na 135 07/06/22  Hyperbilirubinemia, bilirubin 1.5<<2.0 07/03/22  GERD, Hgb 15.9 07/04/22  CKD, Bun/creat 15/1.11 07/06/22  Hypogonadism, takes testosterone inj  Hyperlipidemia, takes Pravastatin  Glaucoma, eye drops.     Past Medical History:  Diagnosis Date   AF (paroxysmal atrial fibrillation) (HCC) 02/16/2018   Atrial fibrillation (HCC)    Atrial fibrillation with RVR (HCC) 02/16/2018   Bipolar 1 disorder (HCC)    Bipolar disorder (HCC) 02/17/2018   Dizziness 02/26/2020   DJD (degenerative joint disease)    Dyslipidemia 02/26/2020   Gait instability 02/16/2018   Hypertension    Mitral regurgitation    mild    Polycythemia 02/16/2018   Renal insufficiency    Stroke [facility])    Tremor 02/16/2018   Tremors of nervous system 01/2018   Past Surgical History:  Procedure Laterality Date   SHOULDER SURGERY      Allergies  Allergen Reactions   Lipitor [Atorvastatin] Other (See Comments)    Joint soreness    Outpatient Encounter Medications as of 07/10/2022  Medication Sig   acetaminophen (TYLENOL) 500 MG tablet Take 500 mg by mouth 2 (  two) times daily.    Ascorbic Acid (VITAMIN C PO) Take 1 tablet by mouth daily.   diltiazem (CARDIZEM CD) 120 MG 24 hr capsule TAKE ONE CAPSULE BY MOUTH DAILY   ELIQUIS 5 MG TABS tablet TAKE 1 TABLET BY MOUTH TWICE DAILY. (Patient taking differently: Take 5 mg by mouth 2 (two) times daily.)   ibandronate (BONIVA) 150 MG tablet Take 150 mg by mouth every 30 (thirty) days.   latanoprost (XALATAN) 0.005 % ophthalmic solution Place 1 drop into both eyes at bedtime.   lithium carbonate (LITHOBID)  300 MG ER tablet Take 1 tablet (300 mg total) by mouth every evening.   Multiple Vitamin (MULTIVITAMIN WITH MINERALS) TABS tablet Take 1 tablet by mouth daily.   pravastatin (PRAVACHOL) 20 MG tablet Take 1 tablet (20 mg total) by mouth every evening.   testosterone cypionate (DEPOTESTOSTERONE CYPIONATE) 200 MG/ML injection Inject 200 mg into the muscle every 21 ( twenty-one) days. Every 3 weeks   timolol (TIMOPTIC) 0.5 % ophthalmic solution Place 1 drop into both eyes daily.   No facility-administered encounter medications on file as of 07/10/2022.    Review of Systems  Constitutional:  Negative for appetite change, fatigue and fever.  HENT:  Positive for hearing loss. Negative for congestion, trouble swallowing and voice change.   Eyes:  Negative for visual disturbance.  Respiratory:  Negative for cough, shortness of breath and wheezing.   Cardiovascular:  Positive for leg swelling. Negative for chest pain and palpitations.  Gastrointestinal:  Negative for abdominal pain, constipation, nausea and vomiting.  Genitourinary:  Negative for dysuria and urgency.  Musculoskeletal:  Positive for arthralgias, gait problem and joint swelling.  Skin:  Negative for color change.  Neurological:  Negative for speech difficulty, weakness and headaches.  Psychiatric/Behavioral:  Positive for confusion. Negative for sleep disturbance. The patient is nervous/anxious.     Immunization History  Administered Date(s) Administered   Influenza-Unspecified 04/15/2022   PPD Test 07/08/2022   Pertinent  Health Maintenance Due  Topic Date Due   INFLUENZA VACCINE  Completed      07/06/2022   11:00 PM 07/07/2022    9:00 AM 07/07/2022    8:50 PM 07/08/2022    9:22 AM 07/10/2022   11:13 AM  Fall Risk  Falls in the past year?     1  Was there an injury with Fall?     1  Fall Risk Category Calculator     3  Fall Risk Category     High  Patient Fall Risk Level High fall risk High fall risk High fall risk High fall  risk High fall risk  Patient at Risk for Falls Due to     History of fall(s);Impaired balance/gait;Impaired mobility  Fall risk Follow up     Falls evaluation completed   Functional Status Survey:    Vitals:   07/10/22 1112  BP: (!) 140/80  Pulse: 76  Resp: 18  Temp: (!) 96.6 F (35.9 C)  SpO2: 97%  Weight: 206 lb (93.4 kg)  Height: 5\' 6"  (1.676 m)   Body mass index is 33.25 kg/m. Physical Exam Vitals and nursing note reviewed.  Constitutional:      Appearance: Normal appearance.  HENT:     Head: Normocephalic and atraumatic.     Nose: Nose normal.     Mouth/Throat:     Mouth: Mucous membranes are moist.  Eyes:     Extraocular Movements: Extraocular movements intact.     Conjunctiva/sclera: Conjunctivae normal.  Pupils: Pupils are equal, round, and reactive to light.  Cardiovascular:     Rate and Rhythm: Normal rate. Rhythm irregular.     Heart sounds: No murmur heard. Pulmonary:     Effort: Pulmonary effort is normal.     Breath sounds: No wheezing, rhonchi or rales.  Abdominal:     General: Bowel sounds are normal.     Palpations: Abdomen is soft.     Tenderness: There is no abdominal tenderness.  Musculoskeletal:        General: No tenderness or deformity.     Cervical back: Normal range of motion and neck supple.     Right lower leg: Edema present.     Left lower leg: Edema present.     Comments: Minimal edema BLE  Skin:    General: Skin is warm and dry.     Findings: Bruising present.     Comments: R+L knee ecchymoses.   Neurological:     General: No focal deficit present.     Mental Status: He is alert and oriented to person, place, and time. Mental status is at baseline.     Motor: No weakness.     Gait: Gait abnormal.  Psychiatric:        Mood and Affect: Mood normal.        Behavior: Behavior normal.        Thought Content: Thought content normal.     Labs reviewed: Recent Labs    07/03/22 0816 07/04/22 1101 07/05/22 0309  07/06/22 0406  NA 134* 135 134* 135  K 4.4 4.1 4.2 3.8  CL 102 106 103 105  CO2 23 21* 21* 22  GLUCOSE 92 113* 90 88  BUN 13 15 15 15   CREATININE 1.20 1.14 1.19 1.11  CALCIUM 9.6 9.1 9.1 9.1  MG 1.9 1.9  --  1.9  PHOS 2.8  --   --  2.6   Recent Labs    07/04/22 1101 07/05/22 0309 07/06/22 0406  AST 105* 87* 64*  ALT 38 38 38  ALKPHOS 55 57 58  BILITOT 1.4* 1.5* 1.5*  PROT 6.5 6.5 6.4*  ALBUMIN 3.3* 3.2* 3.1*   Recent Labs    07/01/22 2346 07/03/22 0816 07/04/22 1101  WBC 12.8* 10.7* 8.9  NEUTROABS 11.2*  --   --   HGB 15.7 16.8 15.9  HCT 49.0 49.6 46.8  MCV 96.5 92.4 91.9  PLT 207 171 187   Lab Results  Component Value Date   TSH 0.953 07/02/2022   Lab Results  Component Value Date   HGBA1C  08/24/2009    5.7 (NOTE) The ADA recommends the following therapeutic goal for glycemic control related to Hgb A1c measurement: Goal of therapy: <6.5 Hgb A1c  Reference: American Diabetes Association: Clinical Practice Recommendations 2010, Diabetes Care, 2010, 33: (Suppl  1).   Lab Results  Component Value Date   CHOL 111 09/02/2020   HDL 34 (L) 09/02/2020   LDLCALC 51 09/02/2020   LDLDIRECT 55 09/02/2020   TRIG 154 (H) 09/02/2020   CHOLHDL 3.3 09/02/2020    Significant Diagnostic Results in last 30 days:  CT Head Wo Contrast  Result Date: 07/01/2022 CLINICAL DATA:  Head and neck trauma. EXAM: CT HEAD WITHOUT CONTRAST CT CERVICAL SPINE WITHOUT CONTRAST TECHNIQUE: Multidetector CT imaging of the head and cervical spine was performed following the standard protocol without intravenous contrast. Multiplanar CT image reconstructions of the cervical spine were also generated. RADIATION DOSE REDUCTION: This exam was performed  according to the departmental dose-optimization program which includes automated exposure control, adjustment of the mA and/or kV according to patient size and/or use of iterative reconstruction technique. COMPARISON:  CT examination dated February 16, 2018 FINDINGS: CT HEAD FINDINGS Brain: No evidence of acute infarction, hemorrhage, extra-axial collection or mass lesion/mass effect. Prominence of the ventricles and sulci secondary to moderate cerebral volume loss. Diffuse low-attenuation of the periventricular white matter presumed advanced chronic microvascular ischemic changes of the white matter. Prominence of the ventricles disproportionate to the volume loss concerning for normal pressure hydrocephalus. Vascular: No hyperdense vessel or unexpected calcification. Skull: Normal. Negative for fracture or focal lesion. Sinuses/Orbits: No acute finding. Other: None. CT CERVICAL SPINE FINDINGS Alignment: Straightening of the cervical spine. Skull base and vertebrae: No acute fracture. No primary bone lesion or focal pathologic process. Soft tissues and spinal canal: No prevertebral fluid or swelling. No visible canal hematoma. Disc levels: Multilevel degenerate disc disease with disc height loss and marginal osteophytes with associated uncovertebral joint and facet joint arthropathy. C2-C3:  No significant findings. C3-C4: Disc height loss and uncovertebral joint arthropathy with mild right and moderate left neural foraminal stenosis. Mild left facet joint arthropathy. C4-C5: Disc height loss and marked uncovertebral joint arthropathy with moderate-to-severe bilateral neural foraminal stenosis. C5-C6: Disc height loss and uncovertebral joint arthropathy with moderate right and severe left neural foraminal stenosis. C6-C7: Disc height loss and uncovertebral joint arthropathy with mild left neural foraminal stenosis. C7-T1: Disc height loss and facet joint arthropathy. No significant neural foraminal stenosis. Upper chest: Negative. Other: None IMPRESSION: CT HEAD: 1. No acute intracranial abnormality. 2. Moderate cerebral volume loss and advanced chronic microvascular ischemic changes of the white matter. 3. Prominence of the ventricles more than expected for the  cerebral volume loss concerning for normal pressure hydrocephalus. CT CERVICAL SPINE: 1. No acute fracture or traumatic subluxation. 2. Multilevel degenerate disc disease with associated uncovertebral joint and facet joint arthropathy. Electronically Signed   By: Larose Hires D.O.   On: 07/01/2022 23:25   CT Cervical Spine Wo Contrast  Result Date: 07/01/2022 CLINICAL DATA:  Head and neck trauma. EXAM: CT HEAD WITHOUT CONTRAST CT CERVICAL SPINE WITHOUT CONTRAST TECHNIQUE: Multidetector CT imaging of the head and cervical spine was performed following the standard protocol without intravenous contrast. Multiplanar CT image reconstructions of the cervical spine were also generated. RADIATION DOSE REDUCTION: This exam was performed according to the departmental dose-optimization program which includes automated exposure control, adjustment of the mA and/or kV according to patient size and/or use of iterative reconstruction technique. COMPARISON:  CT examination dated February 16, 2018 FINDINGS: CT HEAD FINDINGS Brain: No evidence of acute infarction, hemorrhage, extra-axial collection or mass lesion/mass effect. Prominence of the ventricles and sulci secondary to moderate cerebral volume loss. Diffuse low-attenuation of the periventricular white matter presumed advanced chronic microvascular ischemic changes of the white matter. Prominence of the ventricles disproportionate to the volume loss concerning for normal pressure hydrocephalus. Vascular: No hyperdense vessel or unexpected calcification. Skull: Normal. Negative for fracture or focal lesion. Sinuses/Orbits: No acute finding. Other: None. CT CERVICAL SPINE FINDINGS Alignment: Straightening of the cervical spine. Skull base and vertebrae: No acute fracture. No primary bone lesion or focal pathologic process. Soft tissues and spinal canal: No prevertebral fluid or swelling. No visible canal hematoma. Disc levels: Multilevel degenerate disc disease with disc  height loss and marginal osteophytes with associated uncovertebral joint and facet joint arthropathy. C2-C3:  No significant findings. C3-C4: Disc height loss and uncovertebral  joint arthropathy with mild right and moderate left neural foraminal stenosis. Mild left facet joint arthropathy. C4-C5: Disc height loss and marked uncovertebral joint arthropathy with moderate-to-severe bilateral neural foraminal stenosis. C5-C6: Disc height loss and uncovertebral joint arthropathy with moderate right and severe left neural foraminal stenosis. C6-C7: Disc height loss and uncovertebral joint arthropathy with mild left neural foraminal stenosis. C7-T1: Disc height loss and facet joint arthropathy. No significant neural foraminal stenosis. Upper chest: Negative. Other: None IMPRESSION: CT HEAD: 1. No acute intracranial abnormality. 2. Moderate cerebral volume loss and advanced chronic microvascular ischemic changes of the white matter. 3. Prominence of the ventricles more than expected for the cerebral volume loss concerning for normal pressure hydrocephalus. CT CERVICAL SPINE: 1. No acute fracture or traumatic subluxation. 2. Multilevel degenerate disc disease with associated uncovertebral joint and facet joint arthropathy. Electronically Signed   By: Keane Police D.O.   On: 07/01/2022 23:25   DG Chest 1 View  Result Date: 07/01/2022 CLINICAL DATA:  Altered mental status. EXAM: CHEST  1 VIEW COMPARISON:  Chest x-ray 09/06/2013 FINDINGS: The heart and mediastinal contours are unchanged. Aortic calcification. No focal consolidation. No pulmonary edema. No pleural effusion. No pneumothorax. No acute osseous abnormality. Severe degenerative changes of the bilateral shoulders. IMPRESSION: No active disease. Electronically Signed   By: Iven Finn M.D.   On: 07/01/2022 22:44    Assessment/Plan Tremor no apparent tremor noted today, f/u CBC, CMP one week per dc, f/u neurology.   Acute metabolic encephalopathy CT head ?  NPH, his mentation has returned to baseline upon dc  Cognitive impairment Obtain MMSE  Atrial fibrillation, persistent (HCC) Heart rate is in control, on Diltiazem, Eliquis  Bipolar disorder ([place]) His mood is stable, followed by psychiatry, on Lithium, TSH 0.953 07/12/22  Chronic CHF ([place]) EF 45-50% 11/2021, not on diuretics, trace edema BLE  Gait instability uses walker  Hyponatremia Na 135 07/06/22  Hyperbilirubinemia bilirubin 1.5<<2.0 07/03/22  GERD (gastroesophageal reflux disease) Not taking acid reducer, Hgb 15.9 07/04/22  CKD (chronic kidney disease) stage 3, GFR 30-59 ml/min (HCC)  Bun/creat 15/1.11 07/06/22  Hypogonadism in male  takes testosterone inj  Dyslipidemia  takes Pravastatin  Glaucoma eye drops.    Normal pressure hydrocephalus (HCC) CT head ? NPH, his mentation has returned to baseline upon dc,  recommended to f/u neurology  Due Tdap, PNA 20   Family/ staff Communication: plan of care reviewed with the patient and charge nurse  Labs/tests ordered:  CBC/diff, CMP/eGFR one week.   Time spend 35 minutes.

## 2022-07-10 NOTE — Assessment & Plan Note (Signed)
eyedrops

## 2022-07-10 NOTE — Assessment & Plan Note (Signed)
Na 135 07/06/22

## 2022-07-10 NOTE — Assessment & Plan Note (Signed)
takes Pravastatin 

## 2022-07-10 NOTE — Assessment & Plan Note (Signed)
CT head ? NPH, his mentation has returned to baseline upon dc

## 2022-07-10 NOTE — Assessment & Plan Note (Signed)
Heart rate is in control, on Diltiazem, Eliquis 

## 2022-07-10 NOTE — Assessment & Plan Note (Signed)
EF 45-50% 11/2021, not on diuretics, trace edema BLE 

## 2022-07-10 NOTE — Assessment & Plan Note (Signed)
CT head ? NPH, his mentation has returned to baseline upon dc,  recommended to f/u neurology

## 2022-07-10 NOTE — Assessment & Plan Note (Signed)
uses walker.  

## 2022-07-10 NOTE — Assessment & Plan Note (Signed)
no apparent tremor noted today, f/u CBC, CMP one week per dc, f/u neurology.

## 2022-07-13 ENCOUNTER — Encounter: Payer: Self-pay | Admitting: Nurse Practitioner

## 2022-07-13 ENCOUNTER — Non-Acute Institutional Stay (SKILLED_NURSING_FACILITY): Payer: Self-pay | Admitting: Nurse Practitioner

## 2022-07-13 DIAGNOSIS — R4189 Other symptoms and signs involving cognitive functions and awareness: Secondary | ICD-10-CM

## 2022-07-13 DIAGNOSIS — Z0000 Encounter for general adult medical examination without abnormal findings: Secondary | ICD-10-CM

## 2022-07-13 NOTE — Progress Notes (Unsigned)
Provider: Malachy Chamber  Location: [facility] Room Number: 18-A Place of Service:  SNF (31)   PCP: Moshe Cipro, NP Patient Care Team: Moshe Cipro, NP as PCP - General (Family Medicine) Georgeanna Lea, MD as PCP - Cardiology (Cardiology)  Extended Emergency Contact Information Primary Emergency Contact: Willers,[name] Address: [phone]           [facility] [address]          [place], [place] [address] [name] Home Phone: [phone] Mobile Phone: [phone] Relation: Spouse Secondary Emergency Contact: [name] Address: [address]          [place], [place] [address] [place] Home Phone: [phone] Mobile Phone: [phone] Relation: Son  Code Status: DNR Goals of Care: Advanced Directive information    07/13/2022   11:52 AM  Advanced Directives  Does Patient Have a Medical Advance Directive? Yes  Type of Advance Directive Out of facility DNR (pink MOST or yellow form)  Does patient want to make changes to medical advance directive? No - Patient declined     Chief Complaint  Patient presents with   Medicare Wellness    AWV    HPI: Patient is a 89 y.o. male seen today for an annual comprehensive examination.  Past Medical History:  Diagnosis Date   AF (paroxysmal atrial fibrillation) (HCC) 02/16/2018   Atrial fibrillation (HCC)    Atrial fibrillation with RVR (HCC) 02/16/2018   Bipolar 1 disorder (HCC)    Bipolar disorder (HCC) 02/17/2018   Dizziness 02/26/2020   DJD (degenerative joint disease)    Dyslipidemia 02/26/2020   Gait instability 02/16/2018   Hypertension    Mitral regurgitation    mild    Polycythemia 02/16/2018   Renal insufficiency    Stroke [facility])    Tremor 02/16/2018   Tremors of nervous system 01/2018   Past Surgical History:  Procedure Laterality Date   SHOULDER SURGERY      reports that he has quit smoking. He has never used smokeless  tobacco. He reports current alcohol use of about 1.0 standard drink of alcohol per week. He reports that he does not use drugs. Social History   Socioeconomic History   Marital status: Married    Spouse name: Not on file   Number of children: Not on file   Years of education: Not on file   Highest education level: Not on file  Occupational History   Not on file  Tobacco Use   Smoking status: Former   Smokeless tobacco: Never  Vaping Use   Vaping Use: Never used  Substance and Sexual Activity   Alcohol use: Yes    Alcohol/week: 1.0 standard drink of alcohol    Types: 1 Glasses of wine per week    Comment: 1 per month   Drug use: No   Sexual activity: Not on file  Other Topics Concern   Not on file  Social History Narrative   Not on file   Social Determinants of Health   Financial Resource Strain: Not on file  Food Insecurity: No Food Insecurity (07/02/2022)   Hunger Vital Sign    Worried About Running Out of Food in the Last Year: Never true    Ran Out of Food in the Last Year: Never true  Transportation Needs: No Transportation Needs (07/02/2022)   PRAPARE - Administrator, Civil Service (Medical): No    Lack of Transportation (Non-Medical): No  Physical Activity: Not on file  Stress: Not on file  Social Connections: Not on file   Family History  Problem Relation Age of Onset   Alcohol abuse Mother    Cancer Father     Pertinent  Health Maintenance Due  Topic Date Due   INFLUENZA VACCINE  Completed      07/06/2022   11:00 PM 07/07/2022    9:00 AM 07/07/2022    8:50 PM 07/08/2022    9:22 AM 07/10/2022   11:13 AM  Fall Risk  Falls in the past year?     1  Was there an injury with Fall?     1  Fall Risk Category Calculator     3  Fall Risk Category (Retired)     High  (RETIRED) Patient Fall Risk Level High fall risk High fall risk High fall risk High fall risk High fall risk  Patient at Risk for Falls Due to     History of fall(s);Impaired  balance/gait;Impaired mobility  Fall risk Follow up     Falls evaluation completed       No data to display          Functional Status Survey:    Allergies  Allergen Reactions   Lipitor [Atorvastatin] Other (See Comments)    Joint soreness    Outpatient Encounter Medications as of 07/13/2022  Medication Sig   acetaminophen (TYLENOL) 500 MG tablet Take 500 mg by mouth 2 (two) times daily.    Ascorbic Acid (VITAMIN C PO) Take 1 tablet by mouth daily.   diltiazem (CARDIZEM CD) 120 MG 24 hr capsule TAKE ONE CAPSULE BY MOUTH DAILY   ELIQUIS 5 MG TABS tablet TAKE 1 TABLET BY MOUTH TWICE DAILY.   ibandronate (BONIVA) 150 MG tablet Take 150 mg by mouth every 30 (thirty) days.   latanoprost (XALATAN) 0.005 % ophthalmic solution Place 1 drop into both eyes at bedtime.   lithium carbonate (LITHOBID) 300 MG ER tablet Take 1 tablet (300 mg total) by mouth every evening.   Multiple Vitamin (MULTIVITAMIN WITH MINERALS) TABS tablet Take 1 tablet by mouth daily.   pravastatin (PRAVACHOL) 20 MG tablet Take 1 tablet (20 mg total) by mouth every evening.   testosterone cypionate (DEPOTESTOSTERONE CYPIONATE) 200 MG/ML injection Inject 200 mg into the muscle every 21 ( twenty-one) days. Every 3 weeks   timolol (TIMOPTIC) 0.5 % ophthalmic solution Place 1 drop into both eyes daily.   No facility-administered encounter medications on file as of 07/13/2022.    Review of Systems  Vitals:   07/13/22 1151  BP: (!) 142/77  Pulse: 66  Resp: 18  Temp: 97.6 F (36.4 C)  SpO2: 95%  Height: 5\' 6"  (1.676 m)   Body mass index is 33.25 kg/m. Physical Exam  Labs reviewed: Basic Metabolic Panel: Recent Labs    07/03/22 0816 07/04/22 1101 07/05/22 0309 07/06/22 0406  NA 134* 135 134* 135  K 4.4 4.1 4.2 3.8  CL 102 106 103 105  CO2 23 21* 21* 22  GLUCOSE 92 113* 90 88  BUN 13 15 15 15   CREATININE 1.20 1.14 1.19 1.11  CALCIUM 9.6 9.1 9.1 9.1  MG 1.9 1.9  --  1.9  PHOS 2.8  --   --  2.6    Liver Function Tests: Recent Labs    07/04/22 1101 07/05/22 0309 07/06/22 0406  AST 105* 87* 64*  ALT 38 38 38  ALKPHOS 55 57 58  BILITOT 1.4* 1.5* 1.5*  PROT 6.5 6.5 6.4*  ALBUMIN 3.3* 3.2* 3.1*   No results for input(s): "LIPASE", "AMYLASE" in the last 8760 hours. Recent Labs    07/02/22 1132  AMMONIA 22   CBC: Recent Labs    07/01/22 2346 07/03/22 0816 07/04/22 1101  WBC 12.8* 10.7* 8.9  NEUTROABS 11.2*  --   --   HGB 15.7 16.8 15.9  HCT 49.0 49.6 46.8  MCV 96.5 92.4 91.9  PLT 207 171 187   Cardiac Enzymes: Recent Labs    07/03/22 0816 07/04/22 1101 07/05/22 0309  CKTOTAL 3,117* 653* 279   BNP: Invalid input(s): "POCBNP" Lab Results  Component Value Date   HGBA1C  08/24/2009    5.7 (NOTE) The ADA recommends the following therapeutic goal for glycemic control related to Hgb A1c measurement: Goal of therapy: <6.5 Hgb A1c  Reference: American Diabetes Association: Clinical Practice Recommendations 2010, Diabetes Care, 2010, 33: (Suppl  1).   Lab Results  Component Value Date   TSH 0.953 07/02/2022   Lab Results  Component Value Date   [id number] 427 07/02/2022   No results found for: "FOLATE" No results found for: "IRON", "TIBC", "FERRITIN"  Imaging and Procedures obtained recently: CT Head Wo Contrast  Result Date: 07/01/2022 CLINICAL DATA:  Head and neck trauma. EXAM: CT HEAD WITHOUT CONTRAST CT CERVICAL SPINE WITHOUT CONTRAST TECHNIQUE: Multidetector CT imaging of the head and cervical spine was performed following the standard protocol without intravenous contrast. Multiplanar CT image reconstructions of the cervical spine were also generated. RADIATION DOSE REDUCTION: This exam was performed according to the departmental dose-optimization program which includes automated exposure control, adjustment of the mA and/or kV according to patient size and/or use of iterative reconstruction technique. COMPARISON:  CT examination dated February 16, 2018  FINDINGS: CT HEAD FINDINGS Brain: No evidence of acute infarction, hemorrhage, extra-axial collection or mass lesion/mass effect. Prominence of the ventricles and sulci secondary to moderate cerebral volume loss. Diffuse low-attenuation of the periventricular white matter presumed advanced chronic microvascular ischemic changes of the white matter. Prominence of the ventricles disproportionate to the volume loss concerning for normal pressure hydrocephalus. Vascular: No hyperdense vessel or unexpected calcification. Skull: Normal. Negative for fracture or focal lesion. Sinuses/Orbits: No acute finding. Other: None. CT CERVICAL SPINE FINDINGS Alignment: Straightening of the cervical spine. Skull base and vertebrae: No acute fracture. No primary bone lesion or focal pathologic process. Soft tissues and spinal canal: No prevertebral fluid or swelling. No visible canal hematoma. Disc levels: Multilevel degenerate disc disease with disc height loss and marginal osteophytes with associated uncovertebral joint and facet joint arthropathy. C2-C3:  No significant findings. C3-C4: Disc height loss and uncovertebral joint arthropathy with mild right and moderate left neural foraminal stenosis. Mild left facet joint arthropathy. C4-C5: Disc height loss and marked uncovertebral joint arthropathy with moderate-to-severe bilateral neural foraminal stenosis. C5-C6: Disc height loss and uncovertebral joint arthropathy with moderate right and severe left neural foraminal stenosis. C6-C7: Disc height loss and uncovertebral joint arthropathy with mild left neural foraminal stenosis. C7-T1: Disc height loss and facet joint arthropathy. No significant neural foraminal stenosis. Upper chest: Negative. Other: None IMPRESSION: CT HEAD: 1. No acute intracranial abnormality. 2. Moderate cerebral volume loss and advanced chronic microvascular ischemic changes of the white matter. 3. Prominence of the ventricles more than expected for the  cerebral volume loss concerning for normal pressure hydrocephalus. CT CERVICAL SPINE: 1. No acute fracture or traumatic subluxation. 2. Multilevel degenerate disc disease with associated uncovertebral joint and facet joint arthropathy.  Electronically Signed   By: Keane Police D.O.   On: 07/01/2022 23:25   CT Cervical Spine Wo Contrast  Result Date: 07/01/2022 CLINICAL DATA:  Head and neck trauma. EXAM: CT HEAD WITHOUT CONTRAST CT CERVICAL SPINE WITHOUT CONTRAST TECHNIQUE: Multidetector CT imaging of the head and cervical spine was performed following the standard protocol without intravenous contrast. Multiplanar CT image reconstructions of the cervical spine were also generated. RADIATION DOSE REDUCTION: This exam was performed according to the departmental dose-optimization program which includes automated exposure control, adjustment of the mA and/or kV according to patient size and/or use of iterative reconstruction technique. COMPARISON:  CT examination dated February 16, 2018 FINDINGS: CT HEAD FINDINGS Brain: No evidence of acute infarction, hemorrhage, extra-axial collection or mass lesion/mass effect. Prominence of the ventricles and sulci secondary to moderate cerebral volume loss. Diffuse low-attenuation of the periventricular white matter presumed advanced chronic microvascular ischemic changes of the white matter. Prominence of the ventricles disproportionate to the volume loss concerning for normal pressure hydrocephalus. Vascular: No hyperdense vessel or unexpected calcification. Skull: Normal. Negative for fracture or focal lesion. Sinuses/Orbits: No acute finding. Other: None. CT CERVICAL SPINE FINDINGS Alignment: Straightening of the cervical spine. Skull base and vertebrae: No acute fracture. No primary bone lesion or focal pathologic process. Soft tissues and spinal canal: No prevertebral fluid or swelling. No visible canal hematoma. Disc levels: Multilevel degenerate disc disease with disc  height loss and marginal osteophytes with associated uncovertebral joint and facet joint arthropathy. C2-C3:  No significant findings. C3-C4: Disc height loss and uncovertebral joint arthropathy with mild right and moderate left neural foraminal stenosis. Mild left facet joint arthropathy. C4-C5: Disc height loss and marked uncovertebral joint arthropathy with moderate-to-severe bilateral neural foraminal stenosis. C5-C6: Disc height loss and uncovertebral joint arthropathy with moderate right and severe left neural foraminal stenosis. C6-C7: Disc height loss and uncovertebral joint arthropathy with mild left neural foraminal stenosis. C7-T1: Disc height loss and facet joint arthropathy. No significant neural foraminal stenosis. Upper chest: Negative. Other: None IMPRESSION: CT HEAD: 1. No acute intracranial abnormality. 2. Moderate cerebral volume loss and advanced chronic microvascular ischemic changes of the white matter. 3. Prominence of the ventricles more than expected for the cerebral volume loss concerning for normal pressure hydrocephalus. CT CERVICAL SPINE: 1. No acute fracture or traumatic subluxation. 2. Multilevel degenerate disc disease with associated uncovertebral joint and facet joint arthropathy. Electronically Signed   By: Keane Police D.O.   On: 07/01/2022 23:25   DG Chest 1 View  Result Date: 07/01/2022 CLINICAL DATA:  Altered mental status. EXAM: CHEST  1 VIEW COMPARISON:  Chest x-ray 09/06/2013 FINDINGS: The heart and mediastinal contours are unchanged. Aortic calcification. No focal consolidation. No pulmonary edema. No pleural effusion. No pneumothorax. No acute osseous abnormality. Severe degenerative changes of the bilateral shoulders. IMPRESSION: No active disease. Electronically Signed   By: Iven Finn M.D.   On: 07/01/2022 22:44    Assessment/Plan There are no diagnoses linked to this encounter.   Family/ staff Communication:   Labs/tests ordered:

## 2022-07-14 ENCOUNTER — Non-Acute Institutional Stay (SKILLED_NURSING_FACILITY): Payer: Medicare Other | Admitting: Family Medicine

## 2022-07-14 ENCOUNTER — Encounter: Payer: Self-pay | Admitting: Nurse Practitioner

## 2022-07-14 DIAGNOSIS — R2681 Unsteadiness on feet: Secondary | ICD-10-CM

## 2022-07-14 DIAGNOSIS — I480 Paroxysmal atrial fibrillation: Secondary | ICD-10-CM | POA: Diagnosis not present

## 2022-07-14 DIAGNOSIS — I5042 Chronic combined systolic (congestive) and diastolic (congestive) heart failure: Secondary | ICD-10-CM

## 2022-07-14 DIAGNOSIS — R4189 Other symptoms and signs involving cognitive functions and awareness: Secondary | ICD-10-CM

## 2022-07-14 DIAGNOSIS — F319 Bipolar disorder, unspecified: Secondary | ICD-10-CM

## 2022-07-14 DIAGNOSIS — G9341 Metabolic encephalopathy: Secondary | ICD-10-CM

## 2022-07-14 NOTE — Progress Notes (Signed)
Provider:  Alain Honey, MD Location:      Place of Service:     PCP: Faustino Congress, NP Patient Care Team: Faustino Congress, NP as PCP - General (Family Medicine) Park Liter, MD as PCP - Cardiology (Cardiology)  Extended Emergency Contact Information Primary Emergency Contact: Whidby,[name] Address: [phone]           [place] [place] [place]          [place], [place] [address] [place] Phone: [phone] Mobile Phone: [phone] Relation: Spouse Secondary Emergency Contact: Darolyn Rua Address: [address].          [place],  [address] [place] Phone: [phone] Mobile Phone: [phone] Relation: Son  Code Status:  Goals of Care: Advanced Directive information    07/13/2022   11:52 AM  Advanced Directives  Does Patient Have a Medical Advance Directive? Yes  Type of Advance Directive Out of facility DNR (pink MOST or yellow form)  Does patient want to make changes to medical advance directive? No - Patient declined      No chief complaint on file.   HPI: Patient is a 89 y.o. male seen today for admission to [facility] after a 1 week hospital stay for cognitive impairment, ground-level fall and generalized weakness.  He was diagnosed with acute metabolic encephalopathy in the setting of COVID-19 infection and rhabdomyolysis.  CT of the head raise concern for normal pressure hydrocephalus.  Over the course of his hospitalization his encephalopathy and rhabdomyolysis resolved, and he returned to his baseline level of cognition.  Physical therapy at the hospital recommended skilled level of care for continued therapy.  His wife had also tested positive for COVID-19. He also has a history of tremor, hypertension, atrial fibrillation, bipolar disorder. Per his wife, he was diagnosed with Parkinson's disease at some point and started on medicine to deal with those symptoms but  the medication was stopped as it affected cognition. For his persistent A-fib be continues with Cardizem and Eliquis. Only medical providers he is seen in recent years has been psychiatry for his bipolar disorder and cardiology for the A-fib.  For bipolar disorder he takes lithium and is followed by psychiatry as noted above Past Medical History:  Diagnosis Date   AF (paroxysmal atrial fibrillation) ([place]) 02/16/2018   Atrial fibrillation (H[facility])    Atrial fibrillation with RVR ([place]) 02/16/2018   Bipolar 1 disorder ([place])    Bipolar disorder ([place]) 02/17/2018   Dizziness 02/26/2020   DJD (degenerative joint disease)    Dyslipidemia 02/26/2020   Gait instability 02/16/2018   Hypertension    Mitral regurgitation    mild    Polycythemia 02/16/2018   Renal insufficiency    Stroke [facility])    Tremor 02/16/2018   Tremors of nervous system 01/2018   Past Surgical History:  Procedure Laterality Date   SHOULDER SURGERY      reports that he has quit smoking. He has never used smokeless tobacco. He reports current alcohol use of about 1.0 standard drink of alcohol per week. He reports that he does not use drugs. Social History   Socioeconomic History   Marital status: Married    Spouse name: Not on file   Number of children: Not on file   Years of education: Not on file   Highest education level: Not on file  Occupational History   Not on file  Tobacco Use   Smoking status: Former  Smokeless tobacco: Never  Vaping Use   Vaping Use: Never used  Substance and Sexual Activity   Alcohol use: Yes    Alcohol/week: 1.0 standard drink of alcohol    Types: 1 Glasses of wine per week    Comment: 1 per month   Drug use: No   Sexual activity: Not on file  Other Topics Concern   Not on file  Social History Narrative   Not on file   Social Determinants of Health   Financial Resource Strain: Not on file  Food Insecurity: No Food Insecurity (07/02/2022)   Hunger Vital Sign    Worried About Running  Out of Food in the Last Year: Never true    Ran Out of Food in the Last Year: Never true  Transportation Needs: No Transportation Needs (07/02/2022)   PRAPARE - Administrator, Civil Service (Medical): No    Lack of Transportation (Non-Medical): No  Physical Activity: Not on file  Stress: Not on file  Social Connections: Not on file  Intimate Partner Violence: Not At Risk (07/02/2022)   Humiliation, Afraid, Rape, and Kick questionnaire    Fear of Current or Ex-Partner: No    Emotionally Abused: No    Physically Abused: No    Sexually Abused: No    Functional Status Survey:    Family History  Problem Relation Age of Onset   Alcohol abuse Mother    Cancer Father     Health Maintenance  Topic Date Due   Medicare Annual Wellness (AWV)  Never done   COVID-19 Vaccine (1) Never done   DTaP/Tdap/Td (1 - Tdap) Never done   Zoster Vaccines- Shingrix (1 of 2) Never done   Pneumonia Vaccine 46+ Years old (1 - PCV) Never done   INFLUENZA VACCINE  Completed   HPV VACCINES  Aged Out    Allergies  Allergen Reactions   Lipitor [Atorvastatin] Other (See Comments)    Joint soreness    Outpatient Encounter Medications as of 07/14/2022  Medication Sig   acetaminophen (TYLENOL) 500 MG tablet Take 500 mg by mouth 2 (two) times daily.    Ascorbic Acid (VITAMIN C PO) Take 1 tablet by mouth daily.   diltiazem (CARDIZEM CD) 120 MG 24 hr capsule TAKE ONE CAPSULE BY MOUTH DAILY   ELIQUIS 5 MG TABS tablet TAKE 1 TABLET BY MOUTH TWICE DAILY.   ibandronate (BONIVA) 150 MG tablet Take 150 mg by mouth every 30 (thirty) days.   latanoprost (XALATAN) 0.005 % ophthalmic solution Place 1 drop into both eyes at bedtime.   lithium carbonate (LITHOBID) 300 MG ER tablet Take 1 tablet (300 mg total) by mouth every evening.   Multiple Vitamin (MULTIVITAMIN WITH MINERALS) TABS tablet Take 1 tablet by mouth daily.   pravastatin (PRAVACHOL) 20 MG tablet Take 1 tablet (20 mg total) by mouth every evening.    testosterone cypionate (DEPOTESTOSTERONE CYPIONATE) 200 MG/ML injection Inject 200 mg into the muscle every 21 ( twenty-one) days. Every 3 weeks   timolol (TIMOPTIC) 0.5 % ophthalmic solution Place 1 drop into both eyes daily.   No facility-administered encounter medications on file as of 07/14/2022.    Review of Systems  Constitutional: Negative.   HENT: Negative.    Respiratory:  Positive for cough.   Cardiovascular:  Positive for leg swelling.  Musculoskeletal:  Positive for gait problem.  Psychiatric/Behavioral:  Positive for confusion.   All other systems reviewed and are negative.   There were no vitals filed for  this visit. There is no height or weight on file to calculate BMI. Physical Exam Vitals and nursing note reviewed.  Constitutional:      Appearance: He is obese.  HENT:     Mouth/Throat:     Mouth: Mucous membranes are moist.     Pharynx: Oropharynx is clear.  Eyes:     Extraocular Movements: Extraocular movements intact.     Pupils: Pupils are equal, round, and reactive to light.  Cardiovascular:     Rate and Rhythm: Normal rate and regular rhythm.  Pulmonary:     Effort: Pulmonary effort is normal.     Breath sounds: Normal breath sounds.  Abdominal:     General: Bowel sounds are normal.     Palpations: Abdomen is soft.  Skin:    Comments: There is soft tissue swelling and bruising on the right lower leg secondary to fall  Neurological:     General: No focal deficit present.     Mental Status: He is alert and oriented to person, place, and time.  Psychiatric:        Mood and Affect: Mood normal.        Behavior: Behavior normal.     Labs reviewed: Basic Metabolic Panel: Recent Labs    07/03/22 0816 07/04/22 1101 07/05/22 0309 07/06/22 0406  NA 134* 135 134* 135  K 4.4 4.1 4.2 3.8  CL 102 106 103 105  CO2 23 21* 21* 22  GLUCOSE 92 113* 90 88  BUN 13 15 15 15   CREATININE 1.20 1.14 1.19 1.11  CALCIUM 9.6 9.1 9.1 9.1  MG 1.9 1.9  --  1.9   PHOS 2.8  --   --  2.6   Liver Function Tests: Recent Labs    07/04/22 1101 07/05/22 0309 07/06/22 0406  AST 105* 87* 64*  ALT 38 38 38  ALKPHOS 55 57 58  BILITOT 1.4* 1.5* 1.5*  PROT 6.5 6.5 6.4*  ALBUMIN 3.3* 3.2* 3.1*   No results for input(s): "LIPASE", "AMYLASE" in the last 8760 hours. Recent Labs    07/02/22 1132  AMMONIA 22   CBC: Recent Labs    07/01/22 2346 07/03/22 0816 07/04/22 1101  WBC 12.8* 10.7* 8.9  NEUTROABS 11.2*  --   --   HGB 15.7 16.8 15.9  HCT 49.0 49.6 46.8  MCV 96.5 92.4 91.9  PLT 207 171 187   Cardiac Enzymes: Recent Labs    07/03/22 0816 07/04/22 1101 07/05/22 0309  CKTOTAL 3,117* 653* 279   BNP: Invalid input(s): "POCBNP" Lab Results  Component Value Date   HGBA1C  08/24/2009    5.7 (NOTE) The ADA recommends the following therapeutic goal for glycemic control related to Hgb A1c measurement: Goal of therapy: <6.5 Hgb A1c  Reference: American Diabetes Association: Clinical Practice Recommendations 2010, Diabetes Care, 2010, 33: (Suppl  1).   Lab Results  Component Value Date   TSH 0.953 07/02/2022   Lab Results  Component Value Date   VITAMINB12 278 07/02/2022   No results found for: "FOLATE" No results found for: "IRON", "TIBC", "FERRITIN"  Imaging and Procedures obtained prior to SNF admission: CT Head Wo Contrast  Result Date: 07/01/2022 CLINICAL DATA:  Head and neck trauma. EXAM: CT HEAD WITHOUT CONTRAST CT CERVICAL SPINE WITHOUT CONTRAST TECHNIQUE: Multidetector CT imaging of the head and cervical spine was performed following the standard protocol without intravenous contrast. Multiplanar CT image reconstructions of the cervical spine were also generated. RADIATION DOSE REDUCTION: This exam was performed  according to the departmental dose-optimization program which includes automated exposure control, adjustment of the mA and/or kV according to patient size and/or use of iterative reconstruction technique. COMPARISON:   CT examination dated February 16, 2018 FINDINGS: CT HEAD FINDINGS Brain: No evidence of acute infarction, hemorrhage, extra-axial collection or mass lesion/mass effect. Prominence of the ventricles and sulci secondary to moderate cerebral volume loss. Diffuse low-attenuation of the periventricular white matter presumed advanced chronic microvascular ischemic changes of the white matter. Prominence of the ventricles disproportionate to the volume loss concerning for normal pressure hydrocephalus. Vascular: No hyperdense vessel or unexpected calcification. Skull: Normal. Negative for fracture or focal lesion. Sinuses/Orbits: No acute finding. Other: None. CT CERVICAL SPINE FINDINGS Alignment: Straightening of the cervical spine. Skull base and vertebrae: No acute fracture. No primary bone lesion or focal pathologic process. Soft tissues and spinal canal: No prevertebral fluid or swelling. No visible canal hematoma. Disc levels: Multilevel degenerate disc disease with disc height loss and marginal osteophytes with associated uncovertebral joint and facet joint arthropathy. C2-C3:  No significant findings. C3-C4: Disc height loss and uncovertebral joint arthropathy with mild right and moderate left neural foraminal stenosis. Mild left facet joint arthropathy. C4-C5: Disc height loss and marked uncovertebral joint arthropathy with moderate-to-severe bilateral neural foraminal stenosis. C5-C6: Disc height loss and uncovertebral joint arthropathy with moderate right and severe left neural foraminal stenosis. C6-C7: Disc height loss and uncovertebral joint arthropathy with mild left neural foraminal stenosis. C7-T1: Disc height loss and facet joint arthropathy. No significant neural foraminal stenosis. Upper chest: Negative. Other: None IMPRESSION: CT HEAD: 1. No acute intracranial abnormality. 2. Moderate cerebral volume loss and advanced chronic microvascular ischemic changes of the white matter. 3. Prominence of the  ventricles more than expected for the cerebral volume loss concerning for normal pressure hydrocephalus. CT CERVICAL SPINE: 1. No acute fracture or traumatic subluxation. 2. Multilevel degenerate disc disease with associated uncovertebral joint and facet joint arthropathy. Electronically Signed   By: Keane Police D.O.   On: 07/01/2022 23:25   CT Cervical Spine Wo Contrast  Result Date: 07/01/2022 CLINICAL DATA:  Head and neck trauma. EXAM: CT HEAD WITHOUT CONTRAST CT CERVICAL SPINE WITHOUT CONTRAST TECHNIQUE: Multidetector CT imaging of the head and cervical spine was performed following the standard protocol without intravenous contrast. Multiplanar CT image reconstructions of the cervical spine were also generated. RADIATION DOSE REDUCTION: This exam was performed according to the departmental dose-optimization program which includes automated exposure control, adjustment of the mA and/or kV according to patient size and/or use of iterative reconstruction technique. COMPARISON:  CT examination dated February 16, 2018 FINDINGS: CT HEAD FINDINGS Brain: No evidence of acute infarction, hemorrhage, extra-axial collection or mass lesion/mass effect. Prominence of the ventricles and sulci secondary to moderate cerebral volume loss. Diffuse low-attenuation of the periventricular white matter presumed advanced chronic microvascular ischemic changes of the white matter. Prominence of the ventricles disproportionate to the volume loss concerning for normal pressure hydrocephalus. Vascular: No hyperdense vessel or unexpected calcification. Skull: Normal. Negative for fracture or focal lesion. Sinuses/Orbits: No acute finding. Other: None. CT CERVICAL SPINE FINDINGS Alignment: Straightening of the cervical spine. Skull base and vertebrae: No acute fracture. No primary bone lesion or focal pathologic process. Soft tissues and spinal canal: No prevertebral fluid or swelling. No visible canal hematoma. Disc levels: Multilevel  degenerate disc disease with disc height loss and marginal osteophytes with associated uncovertebral joint and facet joint arthropathy. C2-C3:  No significant findings. C3-C4: Disc height loss and uncovertebral  joint arthropathy with mild right and moderate left neural foraminal stenosis. Mild left facet joint arthropathy. C4-C5: Disc height loss and marked uncovertebral joint arthropathy with moderate-to-severe bilateral neural foraminal stenosis. C5-C6: Disc height loss and uncovertebral joint arthropathy with moderate right and severe left neural foraminal stenosis. C6-C7: Disc height loss and uncovertebral joint arthropathy with mild left neural foraminal stenosis. C7-T1: Disc height loss and facet joint arthropathy. No significant neural foraminal stenosis. Upper chest: Negative. Other: None IMPRESSION: CT HEAD: 1. No acute intracranial abnormality. 2. Moderate cerebral volume loss and advanced chronic microvascular ischemic changes of the white matter. 3. Prominence of the ventricles more than expected for the cerebral volume loss concerning for normal pressure hydrocephalus. CT CERVICAL SPINE: 1. No acute fracture or traumatic subluxation. 2. Multilevel degenerate disc disease with associated uncovertebral joint and facet joint arthropathy. Electronically Signed   By: Larose Hires D.O.   On: 07/01/2022 23:25   DG Chest 1 View  Result Date: 07/01/2022 CLINICAL DATA:  Altered mental status. EXAM: CHEST  1 VIEW COMPARISON:  Chest x-ray 09/06/2013 FINDINGS: The heart and mediastinal contours are unchanged. Aortic calcification. No focal consolidation. No pulmonary edema. No pleural effusion. No pneumothorax. No acute osseous abnormality. Severe degenerative changes of the bilateral shoulders. IMPRESSION: No active disease. Electronically Signed   By: Tish Frederickson M.D.   On: 07/01/2022 22:44    Assessment/Plan 1. Acute metabolic encephalopathy This has resolved per history.  There is still at baseline  is some degree creasing cognitive functioning  2. AF (paroxysmal atrial fibrillation) (HCC) Followed by cardiology.  Rhythm and rate are controlled with Cardizem and he is on Eliquis for stroke prevention  3. Bipolar 1 disorder ([facility]CC) Continue with lithium per psychiatry  4. Chronic combined systolic and diastolic congestive heart failure (HCC) Ejection fraction per echo is diminished but patient is asymptomatic except for some mild dependent edema  5. Cognitive impairment No medicines for cognition.  Supportive care is indicated  6. Gait instability Patient uses a walker for ambulation.  PT is working with him during the stay in skilled care    Family/ staff Communication:   Labs/tests ordered:  Bertram Millard. Hyacinth Meeker, MD [facility] [address] [place], [place] [address] Office [phone]

## 2022-07-14 NOTE — Progress Notes (Addendum)
Subjective:   Sean Bishop is a 89 y.o. male who presents for Medicare Annual/Subsequent preventive examination @ SN[facility] [address].  Cardiac Risk Factors include: advanced age (>75men, >24 women);dyslipidemia;male gender;obesity (BMI >30kg/m2)     Objective:    Today's Vitals   07/13/22 1151  BP: (!) 142/77  Pulse: 66  Resp: 18  Temp: 97.6 F (36.4 C)  SpO2: 95%  Height: 5\' 6"  (1.676 m)   Body mass index is 33.25 kg/m.     07/13/2022   11:52 AM 07/10/2022   11:13 AM 07/02/2022    8:10 PM 07/02/2022    7:27 PM 07/01/2022    9:49 PM 04/21/2018   10:22 AM 02/17/2018    6:03 PM  Advanced Directives  Does Patient Have a Medical Advance Directive? Yes Yes Yes No No Yes No  Type of Advance Directive Out of facility DNR (pink MOST or yellow form) Living will;Healthcare Power of Attorney;Out of facility DNR (pink MOST or yellow form) Living will;Healthcare Power of 02/19/2018 of [place];Living will   Does patient want to make changes to medical advance directive? No - Patient declined No - Patient declined No - Patient declined      Copy of Healthcare Power of Attorney in Chart?  No - copy requested No - copy requested      Would patient like information on creating a medical advance directive?  No - Patient declined   No - Patient declined  No - Patient declined    Current Medications (verified) Outpatient Encounter Medications as of 07/13/2022  Medication Sig   acetaminophen (TYLENOL) 500 MG tablet Take 500 mg by mouth 2 (two) times daily.    Ascorbic Acid (VITAMIN C PO) Take 1 tablet by mouth daily.   diltiazem (CARDIZEM CD) 120 MG 24 hr capsule TAKE ONE CAPSULE BY MOUTH DAILY   ELIQUIS 5 MG TABS tablet TAKE 1 TABLET BY MOUTH TWICE DAILY.   ibandronate (BONIVA) 150 MG tablet Take 150 mg by mouth every 30 (thirty) days.   latanoprost (XALATAN) 0.005 % ophthalmic solution Place 1 drop into both eyes at bedtime.   lithium carbonate (LITHOBID) 300 MG ER  tablet Take 1 tablet (300 mg total) by mouth every evening.   Multiple Vitamin (MULTIVITAMIN WITH MINERALS) TABS tablet Take 1 tablet by mouth daily.   pravastatin (PRAVACHOL) 20 MG tablet Take 1 tablet (20 mg total) by mouth every evening.   testosterone cypionate (DEPOTESTOSTERONE CYPIONATE) 200 MG/ML injection Inject 200 mg into the muscle every 21 ( twenty-one) days. Every 3 weeks   timolol (TIMOPTIC) 0.5 % ophthalmic solution Place 1 drop into both eyes daily.   No facility-administered encounter medications on file as of 07/13/2022.    Allergies (verified) Lipitor [atorvastatin]   History: Past Medical History:  Diagnosis Date   AF (paroxysmal atrial fibrillation) (HCC) 02/16/2018   Atrial fibrillation (HCC)    Atrial fibrillation with RVR (HCC) 02/16/2018   Bipolar 1 disorder (HCC)    Bipolar disorder (HCC) 02/17/2018   Dizziness 02/26/2020   DJD (degenerative joint disease)    Dyslipidemia 02/26/2020   Gait instability 02/16/2018   Hypertension    Mitral regurgitation    mild    Polycythemia 02/16/2018   Renal insufficiency    Stroke [facility])    Tremor 02/16/2018   Tremors of nervous system 01/2018   Past Surgical History:  Procedure Laterality Date   SHOULDER SURGERY     Family History  Problem Relation  Age of Onset   Alcohol abuse Mother    Cancer Father    Social History   Socioeconomic History   Marital status: Married    Spouse name: Not on file   Number of children: Not on file   Years of education: Not on file   Highest education level: Not on file  Occupational History   Not on file  Tobacco Use   Smoking status: Former   Smokeless tobacco: Never  Vaping Use   Vaping Use: Never used  Substance and Sexual Activity   Alcohol use: Yes    Alcohol/week: 1.0 standard drink of alcohol    Types: 1 Glasses of wine per week    Comment: 1 per month   Drug use: No   Sexual activity: Not on file  Other Topics Concern   Not on file  Social History Narrative    Not on file   Social Determinants of Health   Financial Resource Strain: Not on file  Food Insecurity: No Food Insecurity (07/02/2022)   Hunger Vital Sign    Worried About Running Out of Food in the Last Year: Never true    Ran Out of Food in the Last Year: Never true  Transportation Needs: No Transportation Needs (07/02/2022)   PRAPARE - Administrator, Civil Service (Medical): No    Lack of Transportation (Non-Medical): No  Physical Activity: Not on file  Stress: Not on file  Social Connections: Not on file    Tobacco Counseling Counseling given: Not Answered   Clinical Intake:     Pain : No/denies pain     BMI - recorded: 33.25 Nutritional Status: BMI > 30  Obese Nutritional Risks: None  How often do you need to have someone help you when you read instructions, pamphlets, or other written materials from your doctor or pharmacy?: 3 - Sometimes What is the last grade level you completed in school?: college  Diabetic?no  Interpreter Needed?: No  Information entered by :: Jaydenn Boccio Nedra Hai NP   Activities of Daily Living    07/14/2022   12:47 PM 07/02/2022    8:10 PM  In your present state of health, do you have any difficulty performing the following activities:  Hearing? 0 1  Vision? 0 0  Difficulty concentrating or making decisions? 1 1  Walking or climbing stairs? 1 1  Dressing or bathing? 1 1  Doing errands, shopping? 1 0  Preparing Food and eating ? N   Using the Toilet? N   In the past six months, have you accidently leaked urine? Y   Do you have problems with loss of bowel control? N   Managing your Medications? Y   Managing your Finances? Y   Housekeeping or managing your Housekeeping? Y     Patient Care Team: Moshe Cipro, NP as PCP - General (Family Medicine) Georgeanna Lea, MD as PCP - Cardiology (Cardiology)  Indicate any recent Medical Services you may have received from other than [facility] providers in the past year (date may  be approximate).     Assessment:   This is a routine wellness examination for Sean Bishop.  Hearing/Vision screen No results found.  Dietary issues and exercise activities discussed: Current Exercise Habits: The patient does not participate in regular exercise at present, Exercise limited by: orthopedic condition(s);neurologic condition(s);psychological condition(s)   Goals Addressed             This Visit's Progress    Maintain Mobility and Function  Evidence-based guidance:  Acknowledge and validate impact of pain, loss of strength and potential disfigurement (hand osteoarthritis) on mental health and daily life, such as social isolation, anxiety, depression, impaired sexual relationship and   injury from falls.  Anticipate referral to physical or occupational therapy for assessment, therapeutic exercise and recommendation for adaptive equipment or assistive devices; encourage participation.  Assess impact on ability to perform activities of daily living, as well as engage in sports and leisure events or requirements of work or school.  Provide anticipatory guidance and reassurance about the benefit of exercise to maintain function; acknowledge and normalize fear that exercise may worsen symptoms.  Encourage regular exercise, at least 10 minutes at a time for 45 minutes per week; consider yoga, water exercise and proprioceptive exercises; encourage use of wearable activity tracker to increase motivation and adherence.  Encourage maintenance or resumption of daily activities, including employment, as pain allows and with minimal exposure to trauma.  Assist patient to advocate for adaptations to the work environment.  Consider level of pain and function, gender, age, lifestyle, patient preference, quality of life, readiness and ?ocapacity to benefit? when recommending patients for orthopaedic surgery consultation.  Explore strategies, such as changes to medication regimen or activity  that enables patient to anticipate and manage flare-ups that increase deconditioning and disability.  Explore patient preferences; encourage exposure to a broader range of activities that have been avoided for fear of experiencing pain.  Identify barriers to participation in therapy or exercise, such as pain with activity, anticipated or imagined pain.  Monitor postoperative joint replacement or any preexisting joint replacement for ongoing pain and loss of function; provide social support and encouragement throughout recovery.   Notes:        Depression Screen     No data to display          Fall Risk    07/10/2022   11:13 AM 03/09/2018    2:09 PM  Fall Risk   Falls in the past year? 1 Yes  Number falls in past yr: 1 1  Injury with Fall? 1 Yes  Risk Factor Category   High Fall Risk  Risk for fall due to : History of fall(s);Impaired balance/gait;Impaired mobility Impaired balance/gait;Impaired mobility  Follow up Falls evaluation completed Education provided    FALL RISK PREVENTION PERTAINING TO THE HOME:  Any stairs in or around the home? Yes  If so, are there any without handrails? No  Home free of loose throw rugs in walkways, pet beds, electrical cords, etc? Yes  Adequate lighting in your home to reduce risk of falls? Yes   ASSISTIVE DEVICES UTILIZED TO PREVENT FALLS:  Life alert? No  Use of a cane, walker or w/c? Yes  Grab bars in the bathroom? Yes  Shower chair or bench in shower? Yes  Elevated toilet seat or a handicapped toilet? Yes   TIMED UP AND GO:  Was the test performed? Yes .  Length of time to ambulate 10 feet: 15 sec.   Gait slow and steady with assistive device  Cognitive Function:    07/16/2022   12:27 PM 07/14/2022   12:48 PM  MMSE - Mini Mental State Exam  Orientation to time 3 3  Orientation to Place 2 3  Registration 3 3  Attention/ Calculation 3 2  Recall 0 0  Language- name 2 objects 2 2  Language- repeat 1 1  Language- follow 3  step command 3 3  Language- read & follow direction 1 1  Write a sentence 0 0  Copy design 0 0  Total score 18 18        Immunizations Immunization History  Administered Date(s) Administered   Influenza-Unspecified 04/15/2022   PPD Test 07/08/2022    TDAP status: Due, Education has been provided regarding the importance of this vaccine. Advised may receive this vaccine at local pharmacy or Health Dept. Aware to provide a copy of the vaccination record if obtained from local pharmacy or Health Dept. Verbalized acceptance and understanding.  Flu Vaccine status: Up to date  Pneumococcal vaccine status: Due, Education has been provided regarding the importance of this vaccine. Advised may receive this vaccine at local pharmacy or Health Dept. Aware to provide a copy of the vaccination record if obtained from local pharmacy or Health Dept. Verbalized acceptance and understanding.  Covid-19 vaccine status: Information provided on how to obtain vaccines.   Qualifies for Shingles Vaccine? Yes   Zostavax completed Yes   Shingrix Completed?: No.    Education has been provided regarding the importance of this vaccine. Patient has been advised to call insurance company to determine out of pocket expense if they have not yet received this vaccine. Advised may also receive vaccine at local pharmacy or Health Dept. Verbalized acceptance and understanding.  Screening Tests Health Maintenance  Topic Date Due   COVID-19 Vaccine (1) Never done   DTaP/Tdap/Td (1 - Tdap) Never done   Zoster Vaccines- Shingrix (1 of 2) Never done   Pneumonia Vaccine 49+ Years old (1 - PCV) Never done   Medicare Annual Wellness (AWV)  07/14/2023   INFLUENZA VACCINE  Completed   HPV VACCINES  Aged Out    Health Maintenance  Health Maintenance Due  Topic Date Due   COVID-19 Vaccine (1) Never done   DTaP/Tdap/Td (1 - Tdap) Never done   Zoster Vaccines- Shingrix (1 of 2) Never done   Pneumonia Vaccine 65+ Years  old (1 - PCV) Never done    Colorectal cancer screening: No longer required.   Lung Cancer Screening: (Low Dose CT Chest recommended if Age 77-80 years, 30 pack-year currently smoking OR have quit w/in 15years.) does not qualify.    Additional Screening:  Hepatitis C Screening: does not qualify;  Vision Screening: Recommended annual ophthalmology exams for early detection of glaucoma and other disorders of the eye. Is the patient up to date with their annual eye exam?  No  Who is the provider or what is the name of the office in which the patient attends annual eye exams? [facility] will provide If pt is not established with a provider, would they like to be referred to a provider to establish care? No .   Dental Screening: Recommended annual dental exams for proper oral hygiene  Community Resource Referral / Chronic Care Management: CRR required this visit?  No   CCM required this visit?  No      Plan:    Order Shingrix, Tdap, PNA 20 sent.  I have personally reviewed and noted the following in the patient's chart:   Medical and social history Use of alcohol, tobacco or illicit drugs  Current medications and supplements including opioid prescriptions. Patient is not currently taking opioid prescriptions. Functional ability and status Nutritional status Physical activity Advanced directives List of other physicians Hospitalizations, surgeries, and ER visits in previous 12 months Vitals Screenings to include cognitive, depression, and falls Referrals and appointments  In addition, I have reviewed and discussed with patient certain preventive protocols, quality metrics, and  best practice recommendations. A written personalized care plan for preventive services as well as general preventive health recommendations were provided to patient.     Sonyia Muro X Taresa [place], NP   07/16/2022

## 2022-07-14 NOTE — Progress Notes (Deleted)
.  smm 

## 2022-07-16 LAB — CBC AND DIFFERENTIAL
HCT: 48 (ref 41–53)
Hemoglobin: 16.2 (ref 13.5–17.5)
Neutrophils Absolute: 5848
Platelets: 420 10*3/uL — AB (ref 150–400)
WBC: 8.6

## 2022-07-16 LAB — CBC: RBC: 5.26 — AB (ref 3.87–5.11)

## 2022-07-16 LAB — HEPATIC FUNCTION PANEL
ALT: 27 U/L (ref 10–40)
AST: 26 (ref 14–40)
Alkaline Phosphatase: 75 (ref 25–125)
Bilirubin, Total: 1.4

## 2022-07-16 LAB — COMPREHENSIVE METABOLIC PANEL
Albumin: 3.8 (ref 3.5–5.0)
Globulin: 3.2

## 2022-07-30 DIAGNOSIS — M6281 Muscle weakness (generalized): Secondary | ICD-10-CM | POA: Diagnosis not present

## 2022-07-30 DIAGNOSIS — R251 Tremor, unspecified: Secondary | ICD-10-CM | POA: Diagnosis not present

## 2022-07-30 DIAGNOSIS — E291 Testicular hypofunction: Secondary | ICD-10-CM | POA: Diagnosis not present

## 2022-07-30 DIAGNOSIS — U071 COVID-19: Secondary | ICD-10-CM | POA: Diagnosis not present

## 2022-07-30 DIAGNOSIS — R1311 Dysphagia, oral phase: Secondary | ICD-10-CM | POA: Diagnosis not present

## 2022-07-30 DIAGNOSIS — R4189 Other symptoms and signs involving cognitive functions and awareness: Secondary | ICD-10-CM | POA: Diagnosis not present

## 2022-07-30 DIAGNOSIS — H409 Unspecified glaucoma: Secondary | ICD-10-CM | POA: Diagnosis not present

## 2022-07-30 DIAGNOSIS — I4821 Permanent atrial fibrillation: Secondary | ICD-10-CM | POA: Diagnosis not present

## 2022-07-30 DIAGNOSIS — N1831 Chronic kidney disease, stage 3a: Secondary | ICD-10-CM | POA: Diagnosis not present

## 2022-07-30 DIAGNOSIS — I4819 Other persistent atrial fibrillation: Secondary | ICD-10-CM | POA: Diagnosis not present

## 2022-07-30 DIAGNOSIS — E669 Obesity, unspecified: Secondary | ICD-10-CM | POA: Diagnosis not present

## 2022-07-30 DIAGNOSIS — K219 Gastro-esophageal reflux disease without esophagitis: Secondary | ICD-10-CM | POA: Diagnosis not present

## 2022-08-03 ENCOUNTER — Non-Acute Institutional Stay (SKILLED_NURSING_FACILITY): Payer: Medicare Other | Admitting: Nurse Practitioner

## 2022-08-03 ENCOUNTER — Encounter: Payer: Self-pay | Admitting: Nurse Practitioner

## 2022-08-03 DIAGNOSIS — N1831 Chronic kidney disease, stage 3a: Secondary | ICD-10-CM

## 2022-08-03 DIAGNOSIS — R4189 Other symptoms and signs involving cognitive functions and awareness: Secondary | ICD-10-CM

## 2022-08-03 DIAGNOSIS — I4821 Permanent atrial fibrillation: Secondary | ICD-10-CM

## 2022-08-03 DIAGNOSIS — F319 Bipolar disorder, unspecified: Secondary | ICD-10-CM

## 2022-08-03 DIAGNOSIS — R251 Tremor, unspecified: Secondary | ICD-10-CM | POA: Diagnosis not present

## 2022-08-03 DIAGNOSIS — D751 Secondary polycythemia: Secondary | ICD-10-CM

## 2022-08-03 DIAGNOSIS — I5042 Chronic combined systolic (congestive) and diastolic (congestive) heart failure: Secondary | ICD-10-CM

## 2022-08-03 DIAGNOSIS — E291 Testicular hypofunction: Secondary | ICD-10-CM

## 2022-08-03 DIAGNOSIS — E871 Hypo-osmolality and hyponatremia: Secondary | ICD-10-CM

## 2022-08-03 NOTE — Assessment & Plan Note (Signed)
Parkinson's disease, stopped parkinson's medication because of affected his cognition, had resting tremor in left arm/leg in the past

## 2022-08-03 NOTE — Assessment & Plan Note (Signed)
Heart rate is in control, on Diltiazem, Eliquis 

## 2022-08-03 NOTE — Assessment & Plan Note (Signed)
Bun/creat 17/1.23 07/14/22

## 2022-08-03 NOTE — Progress Notes (Signed)
Location:   [facility] Room Number: 18 Place of Service:  SNF (31) Provider: Arna Snipe Bolton Canupp NP  Moshe Cipro, NP  Patient Care Team: Moshe Cipro, NP as PCP - General (Family Medicine) Georgeanna Lea, MD as PCP - Cardiology (Cardiology)  Extended Emergency Contact Information Primary Emergency Contact: Figeroa,[name] Address: [phone]           [facility] [address]          [place], [place] [address] [name] Home Phone: [phone] Mobile Phone: [phone] Relation: Spouse Secondary Emergency Contact: [name] Address: [address]          [place], [place] [address] [place] Home Phone: [phone] Mobile Phone: [phone] Relation: Son  Code Status:  DNR Goals of care: Advanced Directive information    08/03/2022    4:31 PM  Advanced Directives  Does Patient Have a Medical Advance Directive? No;Yes  Type of Advance Directive Out of facility DNR (pink MOST or yellow form)  Does patient want to make changes to medical advance directive? No - Patient declined     Chief Complaint  Patient presents with   Medical Management of Chronic Issues   Quality Metric Gaps    Needs to Discuss Covid and Shingrix vaccine    HPI:  Pt is a 89 y.o. male seen today for medical management of chronic diseases.     Parkinson's disease, stopped parkinson's medication because of affected his cognition, had resting tremor in left arm/leg in the past             Cognitive impairment, SNF [facility] for supportive care, 07/16/22 MMSE 18/30             CVA, no focal weakness residual.              Afib, on Diltiazem, Eliquis             Bipolar disorder, followed by psychiatry, on Lithium, TSH 0.953 07/12/22, 07/16/22 Lithium level 0.6(0.6-1.2)             CHF, EF 45-50% 11/2021, not on diuretics, trace edema BLE             Gait abnormality, uses walker             Hyponatremia, Na 136 07/14/22              Hyperbilirubinemia, bilirubin 1.3 07/14/22            Polycythemia,  Hgb 16.3 07/14/22             CKD, Bun/creat 17/1.23 07/14/22             Hypogonadism, takes testosterone inj             Hyperlipidemia, takes Pravastatin             Glaucoma, eye drops.          Past Medical History:  Diagnosis Date   AF (paroxysmal atrial fibrillation) (HCC) 02/16/2018   Atrial fibrillation (HCC)    Atrial fibrillation with RVR (HCC) 02/16/2018   Bipolar 1 disorder (HCC)    Bipolar disorder (HCC) 02/17/2018   Dizziness 02/26/2020   DJD (degenerative joint disease)    Dyslipidemia 02/26/2020   Gait instability 02/16/2018   Hypertension    Mitral regurgitation    mild    Polycythemia 02/16/2018   Renal insufficiency    Stroke [facility])    Tremor 02/16/2018  Tremors of nervous system 01/2018   Past Surgical History:  Procedure Laterality Date   SHOULDER SURGERY      Allergies  Allergen Reactions   Lipitor [Atorvastatin] Other (See Comments)    Joint soreness    Allergies as of 08/03/2022       Reactions   Lipitor [atorvastatin] Other (See Comments)   Joint soreness        Medication List        Accurate as of August 03, 2022 11:59 PM. If you have any questions, ask your nurse or doctor.          acetaminophen 500 MG tablet Commonly known as: TYLENOL Take 500 mg by mouth 2 (two) times daily.   diltiazem 120 MG 24 hr capsule Commonly known as: CARDIZEM CD TAKE ONE CAPSULE BY MOUTH DAILY   Eliquis 5 MG Tabs tablet Generic drug: apixaban TAKE 1 TABLET BY MOUTH TWICE DAILY.   ibandronate 150 MG tablet Commonly known as: BONIVA Take 150 mg by mouth every 30 (thirty) days.   latanoprost 0.005 % ophthalmic solution Commonly known as: XALATAN Place 1 drop into both eyes at bedtime.   lithium carbonate 300 MG ER tablet Commonly known as: LITHOBID Take 1 tablet (300 mg total) by mouth every evening.   multivitamin with minerals Tabs tablet Take 1 tablet by mouth daily.    pravastatin 20 MG tablet Commonly known as: PRAVACHOL Take 1 tablet (20 mg total) by mouth every evening.   testosterone cypionate 200 MG/ML injection Commonly known as: DEPOTESTOSTERONE CYPIONATE Inject 200 mg into the muscle every 21 ( twenty-one) days. Every 3 weeks   timolol 0.5 % ophthalmic solution Commonly known as: TIMOPTIC Place 1 drop into both eyes daily.   VITAMIN C PO Take 1 tablet by mouth daily.        Review of Systems  Constitutional:  Negative for appetite change, fatigue and fever.  HENT:  Positive for hearing loss. Negative for congestion, trouble swallowing and voice change.   Eyes:  Negative for visual disturbance.  Respiratory:  Negative for cough, shortness of breath and wheezing.   Cardiovascular:  Positive for leg swelling. Negative for chest pain and palpitations.  Gastrointestinal:  Negative for abdominal pain, constipation, nausea and vomiting.  Genitourinary:  Negative for dysuria and urgency.  Musculoskeletal:  Positive for arthralgias, gait problem and joint swelling.  Skin:  Negative for color change.  Neurological:  Negative for speech difficulty, weakness and headaches.  Psychiatric/Behavioral:  Positive for confusion. Negative for sleep disturbance. The patient is nervous/anxious.     Immunization History  Administered Date(s) Administered   Influenza-Unspecified 04/15/2022   PPD Test 07/08/2022   Pneumococcal Conjugate-13 04/26/2014   Pneumococcal Polysaccharide-23 04/14/2005   Tdap 05/18/2016   Pertinent  Health Maintenance Due  Topic Date Due   INFLUENZA VACCINE  Completed      07/07/2022    9:00 AM 07/07/2022    8:50 PM 07/08/2022    9:22 AM 07/10/2022   11:13 AM 08/03/2022    4:30 PM  [place] in the past year?    1 1  Was there an injury with Fall?    1 1  Fall Risk Category Calculator    3 3  Fall Risk Category (Retired)    High   (RETIRED) Patient Fall Risk Level High fall risk High fall risk High fall risk High  fall risk   Patient at Risk for Falls Due to    History  of fall(s);Impaired balance/gait;Impaired mobility History of fall(s);Impaired balance/gait;Impaired mobility  Fall risk Follow up    Falls evaluation completed Falls evaluation completed   Functional Status Survey:    Vitals:   08/03/22 1339  BP: (!) 142/67  Pulse: 61  Resp: 16  Temp: (!) 97.4 F (36.3 C)  SpO2: 96%  Weight: 203 lb (92.1 kg)   Body mass index is 32.77 kg/m. Physical Exam Vitals and nursing note reviewed.  Constitutional:      Appearance: Normal appearance.  HENT:     Head: Normocephalic and atraumatic.     Nose: Nose normal.     Mouth/Throat:     Mouth: Mucous membranes are moist.  Eyes:     Extraocular Movements: Extraocular movements intact.     Conjunctiva/sclera: Conjunctivae normal.     Pupils: Pupils are equal, round, and reactive to light.  Cardiovascular:     Rate and Rhythm: Normal rate. Rhythm irregular.     Heart sounds: No murmur heard. Pulmonary:     Effort: Pulmonary effort is normal.     Breath sounds: No rales.  Abdominal:     General: Bowel sounds are normal.     Palpations: Abdomen is soft.     Tenderness: There is no abdominal tenderness.  Musculoskeletal:        General: No deformity.     Cervical back: Normal range of motion and neck supple.     Right lower leg: Edema present.     Left lower leg: Edema present.     Comments: Minimal edema BLE, will think about TED  Skin:    General: Skin is warm and dry.  Neurological:     General: No focal deficit present.     Mental Status: He is alert and oriented to person, place, and time. Mental status is at baseline.     Motor: No weakness.     Gait: Gait abnormal.  Psychiatric:        Mood and Affect: Mood normal.        Behavior: Behavior normal.        Thought Content: Thought content normal.     Labs reviewed: Recent Labs    07/03/22 0816 07/04/22 1101 07/05/22 0309 07/06/22 0406  NA 134* 135 134* 135  K 4.4  4.1 4.2 3.8  CL 102 106 103 105  CO2 23 21* 21* 22  GLUCOSE 92 113* 90 88  BUN 13 15 15 15   CREATININE 1.20 1.14 1.19 1.11  CALCIUM 9.6 9.1 9.1 9.1  MG 1.9 1.9  --  1.9  PHOS 2.8  --   --  2.6   Recent Labs    07/04/22 1101 07/05/22 0309 07/06/22 0406  AST 105* 87* 64*  ALT 38 38 38  ALKPHOS 55 57 58  BILITOT 1.4* 1.5* 1.5*  PROT 6.5 6.5 6.4*  ALBUMIN 3.3* 3.2* 3.1*   Recent Labs    07/01/22 2346 07/03/22 0816 07/04/22 1101  WBC 12.8* 10.7* 8.9  NEUTROABS 11.2*  --   --   HGB 15.7 16.8 15.9  HCT 49.0 49.6 46.8  MCV 96.5 92.4 91.9  PLT 207 171 187   Lab Results  Component Value Date   TSH 0.953 07/02/2022   Lab Results  Component Value Date   HGBA1C  08/24/2009    5.7 (NOTE) The ADA recommends the following therapeutic goal for glycemic control related to Hgb A1c measurement: Goal of therapy: <6.5 Hgb A1c  Reference: American Diabetes Association: Clinical  Practice Recommendations 2010, Diabetes Care, 2010, 33: (Suppl  1).   Lab Results  Component Value Date   CHOL 111 09/02/2020   HDL 34 (L) 09/02/2020   LDLCALC 51 09/02/2020   LDLDIRECT 55 09/02/2020   TRIG 154 (H) 09/02/2020   CHOLHDL 3.3 09/02/2020    Significant Diagnostic Results in last 30 days:  No results found.  Assessment/Plan  Tremor Parkinson's disease, stopped parkinson's medication because of affected his cognition, had resting tremor in left arm/leg in the past  Cognitive impairment SNF [facility] for supportive care, 07/16/22 MMSE 18/30  Atrial fibrillation (HCC) Heart rate is in control, on Diltiazem, Eliquis  Bipolar disorder ([place])  followed by psychiatry, on Lithium, TSH 0.953 07/12/22, 07/16/22 Lithium level 0.6(0.6-1.2)  Chronic CHF (HCC) EF 45-50% 11/2021, not on diuretics, trace edema BLE  Hyponatremia Na 136 07/14/22  Hyperbilirubinemia  bilirubin 1.3 07/14/22  Polycythemia Hgb 16.3 07/14/22  CKD (chronic kidney disease) stage 3, GFR 30-59 ml/min (HCC) Bun/creat 17/1.23  07/14/22  Hypogonadism in male  takes testosterone inj   Family/ staff Communication: plan of care reviewed with the patient and charge nurse.   Labs/tests ordered:  none   Time spend 35 minutes.

## 2022-08-03 NOTE — Assessment & Plan Note (Signed)
Na 136 07/14/22

## 2022-08-03 NOTE — Assessment & Plan Note (Addendum)
followed by psychiatry, on Lithium, TSH 0.953 07/12/22, 07/16/22 Lithium level 0.6(0.6-1.2)

## 2022-08-03 NOTE — Assessment & Plan Note (Addendum)
SNF [facility] for supportive care, 07/16/22 MMSE 18/30 

## 2022-08-03 NOTE — Assessment & Plan Note (Signed)
Hgb 16.3 07/14/22

## 2022-08-03 NOTE — Assessment & Plan Note (Signed)
bilirubin 1.3 07/14/22

## 2022-08-03 NOTE — Assessment & Plan Note (Signed)
EF 45-50% 11/2021, not on diuretics, trace edema BLE 

## 2022-08-03 NOTE — Assessment & Plan Note (Signed)
takes testosterone inj

## 2022-08-04 LAB — BASIC METABOLIC PANEL
BUN: 21 (ref 4–21)
CO2: 21 (ref 13–22)
Chloride: 105 (ref 99–108)
Creatinine: 1.1 (ref 0.6–1.3)
Glucose: 92
Potassium: 4.8 mEq/L (ref 3.5–5.1)
Sodium: 138 (ref 137–147)

## 2022-08-04 LAB — COMPREHENSIVE METABOLIC PANEL
Calcium: 10.1 (ref 8.7–10.7)
eGFR: 66

## 2022-08-10 DIAGNOSIS — R29898 Other symptoms and signs involving the musculoskeletal system: Secondary | ICD-10-CM | POA: Diagnosis not present

## 2022-08-10 DIAGNOSIS — R2689 Other abnormalities of gait and mobility: Secondary | ICD-10-CM | POA: Diagnosis not present

## 2022-08-10 DIAGNOSIS — M6281 Muscle weakness (generalized): Secondary | ICD-10-CM | POA: Diagnosis not present

## 2022-08-10 DIAGNOSIS — R278 Other lack of coordination: Secondary | ICD-10-CM | POA: Diagnosis not present

## 2022-08-11 DIAGNOSIS — R2689 Other abnormalities of gait and mobility: Secondary | ICD-10-CM | POA: Diagnosis not present

## 2022-08-11 DIAGNOSIS — R29898 Other symptoms and signs involving the musculoskeletal system: Secondary | ICD-10-CM | POA: Diagnosis not present

## 2022-08-11 DIAGNOSIS — M6281 Muscle weakness (generalized): Secondary | ICD-10-CM | POA: Diagnosis not present

## 2022-08-11 DIAGNOSIS — R278 Other lack of coordination: Secondary | ICD-10-CM | POA: Diagnosis not present

## 2022-08-14 DIAGNOSIS — R29898 Other symptoms and signs involving the musculoskeletal system: Secondary | ICD-10-CM | POA: Diagnosis not present

## 2022-08-14 DIAGNOSIS — R278 Other lack of coordination: Secondary | ICD-10-CM | POA: Diagnosis not present

## 2022-08-14 DIAGNOSIS — M6281 Muscle weakness (generalized): Secondary | ICD-10-CM | POA: Diagnosis not present

## 2022-08-14 DIAGNOSIS — R2689 Other abnormalities of gait and mobility: Secondary | ICD-10-CM | POA: Diagnosis not present

## 2022-08-17 DIAGNOSIS — M6281 Muscle weakness (generalized): Secondary | ICD-10-CM | POA: Diagnosis not present

## 2022-08-17 DIAGNOSIS — R29898 Other symptoms and signs involving the musculoskeletal system: Secondary | ICD-10-CM | POA: Diagnosis not present

## 2022-08-17 DIAGNOSIS — R2689 Other abnormalities of gait and mobility: Secondary | ICD-10-CM | POA: Diagnosis not present

## 2022-08-17 DIAGNOSIS — R278 Other lack of coordination: Secondary | ICD-10-CM | POA: Diagnosis not present

## 2022-08-19 DIAGNOSIS — R278 Other lack of coordination: Secondary | ICD-10-CM | POA: Diagnosis not present

## 2022-08-19 DIAGNOSIS — R2689 Other abnormalities of gait and mobility: Secondary | ICD-10-CM | POA: Diagnosis not present

## 2022-08-19 DIAGNOSIS — M6281 Muscle weakness (generalized): Secondary | ICD-10-CM | POA: Diagnosis not present

## 2022-08-19 DIAGNOSIS — R29898 Other symptoms and signs involving the musculoskeletal system: Secondary | ICD-10-CM | POA: Diagnosis not present

## 2022-08-20 DIAGNOSIS — M6281 Muscle weakness (generalized): Secondary | ICD-10-CM | POA: Diagnosis not present

## 2022-08-20 DIAGNOSIS — R278 Other lack of coordination: Secondary | ICD-10-CM | POA: Diagnosis not present

## 2022-08-20 DIAGNOSIS — R2689 Other abnormalities of gait and mobility: Secondary | ICD-10-CM | POA: Diagnosis not present

## 2022-08-20 DIAGNOSIS — R29898 Other symptoms and signs involving the musculoskeletal system: Secondary | ICD-10-CM | POA: Diagnosis not present

## 2022-08-24 DIAGNOSIS — R2689 Other abnormalities of gait and mobility: Secondary | ICD-10-CM | POA: Diagnosis not present

## 2022-08-24 DIAGNOSIS — R278 Other lack of coordination: Secondary | ICD-10-CM | POA: Diagnosis not present

## 2022-08-24 DIAGNOSIS — R29898 Other symptoms and signs involving the musculoskeletal system: Secondary | ICD-10-CM | POA: Diagnosis not present

## 2022-08-24 DIAGNOSIS — M6281 Muscle weakness (generalized): Secondary | ICD-10-CM | POA: Diagnosis not present

## 2022-08-25 ENCOUNTER — Non-Acute Institutional Stay (SKILLED_NURSING_FACILITY): Payer: Medicare Other | Admitting: Nurse Practitioner

## 2022-08-25 ENCOUNTER — Encounter: Payer: Self-pay | Admitting: Nurse Practitioner

## 2022-08-25 DIAGNOSIS — M6281 Muscle weakness (generalized): Secondary | ICD-10-CM | POA: Diagnosis not present

## 2022-08-25 DIAGNOSIS — R4189 Other symptoms and signs involving cognitive functions and awareness: Secondary | ICD-10-CM

## 2022-08-25 DIAGNOSIS — R278 Other lack of coordination: Secondary | ICD-10-CM | POA: Diagnosis not present

## 2022-08-25 DIAGNOSIS — I4819 Other persistent atrial fibrillation: Secondary | ICD-10-CM | POA: Diagnosis not present

## 2022-08-25 DIAGNOSIS — F319 Bipolar disorder, unspecified: Secondary | ICD-10-CM

## 2022-08-25 DIAGNOSIS — R2689 Other abnormalities of gait and mobility: Secondary | ICD-10-CM | POA: Diagnosis not present

## 2022-08-25 DIAGNOSIS — I5042 Chronic combined systolic (congestive) and diastolic (congestive) heart failure: Secondary | ICD-10-CM

## 2022-08-25 DIAGNOSIS — R29898 Other symptoms and signs involving the musculoskeletal system: Secondary | ICD-10-CM | POA: Diagnosis not present

## 2022-08-25 NOTE — Progress Notes (Signed)
Location:  [facility] Room Number: NO/18/A Place of Service:  SNF (31) Provider:  Curtina Grills X, NP  Patient Care Team: Faustino Congress, NP as PCP - General (Family Medicine) Park Liter, MD as PCP - Cardiology (Cardiology)  Extended Emergency Contact Information Primary Emergency Contact: Coykendall,[name] Address: [id number]           [address] [place] [place]          [place], [place] [address] [place] Phone: [phone] Mobile Phone: [phone] Relation: Spouse Secondary Emergency Contact: Darolyn Rua Address: [address].          [place], [place] [address] [place] Phone: [phone] Mobile Phone: [phone] Relation: Son  Code Status:  DNR Goals of care: Advanced Directive information    08/25/2022   11:49 AM  Advanced Directives  Does Patient Have a Medical Advance Directive? No;Yes  Type of Advance Directive Out of facility DNR (pink MOST or yellow form)  Does patient want to make changes to medical advance directive? No - Patient declined     Chief Complaint  Patient presents with   Medical Management of Chronic Issues    Patient is here for a follow up for chronic conditions     HPI:  Pt is a 89 y.o. male seen today for medical management of chronic diseases.      Parkinson's disease, stopped parkinson's medication because of affected his cognition, had resting tremor in left arm/leg in the past             Cognitive impairment, SNF [facility] for supportive care, 07/16/22 MMSE 18/30             CVA, no focal weakness residual.              Afib, on Diltiazem, Eliquis             Bipolar disorder, followed by psychiatry, on Lithium, TSH 0.953 07/12/22, 07/16/22 Lithium level 0.6(0.6-1.2)             CHF, EF 45-50% 11/2021, not on diuretics, trace edema BLE             Gait abnormality, uses walker             Hyponatremia, Na 136 07/14/22             Hyperbilirubinemia,  bilirubin 1.3 07/14/22            Polycythemia,  Hgb 16.3 07/14/22             CKD, Bun/creat 17/1.23 07/14/22             Hypogonadism, takes testosterone inj             Hyperlipidemia, takes Pravastatin             Glaucoma, eye drops.   Past Medical History:  Diagnosis Date   AF (paroxysmal atrial fibrillation) ([place]) 02/16/2018   Atrial fibrillation (HCC)    Atrial fibrillation with RVR ([place]) 02/16/2018   Bipolar 1 disorder ([place])    Bipolar disorder ([place]) 02/17/2018   Dizziness 02/26/2020   DJD (degenerative joint disease)    Dyslipidemia 02/26/2020   Gait instability 02/16/2018   Hypertension    Mitral regurgitation    mild    Polycythemia 02/16/2018   Renal insufficiency    Stroke [facility])    Tremor 02/16/2018   Tremors of nervous system 01/2018   Past Surgical History:  Procedure Laterality Date   SHOULDER SURGERY      Allergies  Allergen Reactions   Lipitor [Atorvastatin] Other (See Comments)    Joint soreness    Outpatient Encounter Medications as of 08/25/2022  Medication Sig   acetaminophen (TYLENOL) 500 MG tablet Take 500 mg by mouth 2 (two) times daily.    Ascorbic Acid (VITAMIN C PO) Take 1 tablet by mouth daily.   diltiazem (CARDIZEM CD) 120 MG 24 hr capsule TAKE ONE CAPSULE BY MOUTH DAILY   ELIQUIS 5 MG TABS tablet TAKE 1 TABLET BY MOUTH TWICE DAILY.   ibandronate (BONIVA) 150 MG tablet Take 150 mg by mouth every 30 (thirty) days.   latanoprost (XALATAN) 0.005 % ophthalmic solution Place 1 drop into both eyes at bedtime.   lithium carbonate (LITHOBID) 300 MG ER tablet Take 1 tablet (300 mg total) by mouth every evening.   Multiple Vitamin (MULTIVITAMIN WITH MINERALS) TABS tablet Take 1 tablet by mouth daily.   pravastatin (PRAVACHOL) 20 MG tablet Take 1 tablet (20 mg total) by mouth every evening.   testosterone cypionate (DEPOTESTOSTERONE CYPIONATE) 200 MG/ML injection Inject 200 mg into the muscle every 21 ( twenty-one) days. Every 3 weeks   timolol (TIMOPTIC) 0.5  % ophthalmic solution Place 1 drop into both eyes daily.   No facility-administered encounter medications on file as of 08/25/2022.    Review of Systems  Constitutional:  Negative for appetite change, fatigue and fever.  HENT:  Positive for hearing loss. Negative for congestion, trouble swallowing and voice change.   Eyes:  Negative for visual disturbance.  Respiratory:  Negative for cough, shortness of breath and wheezing.   Cardiovascular:  Positive for leg swelling. Negative for chest pain and palpitations.  Gastrointestinal:  Negative for abdominal pain, constipation, nausea and vomiting.  Genitourinary:  Negative for dysuria and urgency.  Musculoskeletal:  Positive for arthralgias, gait problem and joint swelling.  Skin:  Negative for color change.  Neurological:  Negative for speech difficulty, weakness and headaches.  Psychiatric/Behavioral:  Positive for confusion. Negative for sleep disturbance. The patient is not nervous/anxious.     Immunization History  Administered Date(s) Administered   Influenza-Unspecified 04/15/2022   PPD Test 07/08/2022   Pneumococcal Conjugate-13 04/26/2014   Pneumococcal Polysaccharide-23 04/14/2005   Tdap 05/18/2016   Pertinent  Health Maintenance Due  Topic Date Due   INFLUENZA VACCINE  Completed      07/07/2022    8:50 PM 07/08/2022    9:22 AM 07/10/2022   11:13 AM 08/03/2022    4:30 PM 08/25/2022   11:49 AM  [place] in the past year?   '1 1 1  '$ Was there an injury with Fall?   1 1 0  Fall Risk Category Calculator   '3 3 1  '$ Fall Risk Category (Retired)   High    (RETIRED) Patient Fall Risk Level High fall risk High fall risk High fall risk    Patient at Risk for Falls Due to   History of fall(s);Impaired balance/gait;Impaired mobility History of fall(s);Impaired balance/gait;Impaired mobility No Fall Risks  Fall risk Follow up   Falls evaluation completed Falls evaluation completed Falls evaluation completed   Functional Status  Survey:    Vitals:   08/25/22 1151  BP: 120/60  Pulse: 74  Resp: 16  Temp: (!) 97.4 F (36.3 C)  SpO2: 97%  Weight: 203 lb (92.1 kg)  Height: '5\' 6"'$  (1.676 m)   Body mass index is 32.77 kg/m. Physical Exam Vitals  and nursing note reviewed.  Constitutional:      Appearance: Normal appearance.  HENT:     Head: Normocephalic and atraumatic.     Nose: Nose normal.     Mouth/Throat:     Mouth: Mucous membranes are moist.  Eyes:     Extraocular Movements: Extraocular movements intact.     Conjunctiva/sclera: Conjunctivae normal.     Pupils: Pupils are equal, round, and reactive to light.  Cardiovascular:     Rate and Rhythm: Normal rate. Rhythm irregular.     Heart sounds: No murmur heard. Pulmonary:     Effort: Pulmonary effort is normal.     Breath sounds: No rales.  Abdominal:     General: Bowel sounds are normal.     Palpations: Abdomen is soft.     Tenderness: There is no abdominal tenderness.  Musculoskeletal:        General: No deformity.     Cervical back: Normal range of motion and neck supple.     Right lower leg: Edema present.     Left lower leg: Edema present.     Comments: Minimal edema BLE, will think about TED  Skin:    General: Skin is warm and dry.  Neurological:     General: No focal deficit present.     Mental Status: He is alert and oriented to person, place, and time. Mental status is at baseline.     Motor: No weakness.     Gait: Gait abnormal.  Psychiatric:        Mood and Affect: Mood normal.        Behavior: Behavior normal.        Thought Content: Thought content normal.     Labs reviewed: Recent Labs    07/03/22 0816 07/04/22 1101 07/05/22 0309 07/06/22 0406  NA 134* 135 134* 135  K 4.4 4.1 4.2 3.8  CL 102 106 103 105  CO2 23 21* 21* 22  GLUCOSE 92 113* 90 88  BUN '13 15 15 15  '$ CREATININE 1.20 1.14 1.19 1.11  CALCIUM 9.6 9.1 9.1 9.1  MG 1.9 1.9  --  1.9  PHOS 2.8  --   --  2.6   Recent Labs    07/04/22 1101  07/05/22 0309 07/06/22 0406  AST 105* 87* 64*  ALT 38 38 38  ALKPHOS 55 57 58  BILITOT 1.4* 1.5* 1.5*  PROT 6.5 6.5 6.4*  ALBUMIN 3.3* 3.2* 3.1*   Recent Labs    07/01/22 2346 07/03/22 0816 07/04/22 1101  WBC 12.8* 10.7* 8.9  NEUTROABS 11.2*  --   --   HGB 15.7 16.8 15.9  HCT 49.0 49.6 46.8  MCV 96.5 92.4 91.9  PLT 207 171 187   Lab Results  Component Value Date   TSH 0.953 07/02/2022   Lab Results  Component Value Date   HGBA1C  08/24/2009    5.7 (NOTE) The ADA recommends the following therapeutic goal for glycemic control related to Hgb A1c measurement: Goal of therapy: <6.5 Hgb A1c  Reference: American Diabetes Association: Clinical Practice Recommendations 2010, Diabetes Care, 2010, 33: (Suppl  1).   Lab Results  Component Value Date   CHOL 111 09/02/2020   HDL 34 (L) 09/02/2020   LDLCALC 51 09/02/2020   LDLDIRECT 55 09/02/2020   TRIG 154 (H) 09/02/2020   CHOLHDL 3.3 09/02/2020    Significant Diagnostic Results in last 30 days:  No results found.  Assessment/Plan Cognitive impairment  SNF [facility] for supportive  care, 07/16/22 MMSE 18/30  Atrial fibrillation, persistent (HCC) Heart rate is in control, on Diltiazem, Eliquis  Bipolar disorder ([place]) His mood is stable,  followed by psychiatry, on Lithium, TSH 0.953 07/12/22, 07/16/22 Lithium level 0.6(0.6-1.2)  Chronic CHF ([place]) EF 45-50% 11/2021, not on diuretics, trace edema BLE     Family/ staff Communication: plan of care reviewed with the patient and charge nurse.   Labs/tests ordered:  none  Time spend 35 minutes.

## 2022-08-25 NOTE — Assessment & Plan Note (Signed)
His mood is stable,  followed by psychiatry, on Lithium, TSH 0.953 07/12/22, 07/16/22 Lithium level 0.6(0.6-1.2)

## 2022-08-25 NOTE — Assessment & Plan Note (Signed)
SNF [facility] for supportive care, 07/16/22 MMSE 18/30

## 2022-08-25 NOTE — Assessment & Plan Note (Signed)
Heart rate is in control, on Diltiazem, Eliquis

## 2022-08-25 NOTE — Assessment & Plan Note (Signed)
EF 45-50% 11/2021, not on diuretics, trace edema BLE

## 2022-08-27 DIAGNOSIS — R278 Other lack of coordination: Secondary | ICD-10-CM | POA: Diagnosis not present

## 2022-08-27 DIAGNOSIS — R2689 Other abnormalities of gait and mobility: Secondary | ICD-10-CM | POA: Diagnosis not present

## 2022-08-27 DIAGNOSIS — M6281 Muscle weakness (generalized): Secondary | ICD-10-CM | POA: Diagnosis not present

## 2022-08-27 DIAGNOSIS — R29898 Other symptoms and signs involving the musculoskeletal system: Secondary | ICD-10-CM | POA: Diagnosis not present

## 2022-08-28 DIAGNOSIS — R29898 Other symptoms and signs involving the musculoskeletal system: Secondary | ICD-10-CM | POA: Diagnosis not present

## 2022-08-28 DIAGNOSIS — R278 Other lack of coordination: Secondary | ICD-10-CM | POA: Diagnosis not present

## 2022-08-28 DIAGNOSIS — M6281 Muscle weakness (generalized): Secondary | ICD-10-CM | POA: Diagnosis not present

## 2022-08-28 DIAGNOSIS — R2689 Other abnormalities of gait and mobility: Secondary | ICD-10-CM | POA: Diagnosis not present

## 2022-08-31 DIAGNOSIS — R2689 Other abnormalities of gait and mobility: Secondary | ICD-10-CM | POA: Diagnosis not present

## 2022-08-31 DIAGNOSIS — M6281 Muscle weakness (generalized): Secondary | ICD-10-CM | POA: Diagnosis not present

## 2022-08-31 DIAGNOSIS — R278 Other lack of coordination: Secondary | ICD-10-CM | POA: Diagnosis not present

## 2022-08-31 DIAGNOSIS — R29898 Other symptoms and signs involving the musculoskeletal system: Secondary | ICD-10-CM | POA: Diagnosis not present

## 2022-09-02 DIAGNOSIS — M6281 Muscle weakness (generalized): Secondary | ICD-10-CM | POA: Diagnosis not present

## 2022-09-02 DIAGNOSIS — R278 Other lack of coordination: Secondary | ICD-10-CM | POA: Diagnosis not present

## 2022-09-02 DIAGNOSIS — R29898 Other symptoms and signs involving the musculoskeletal system: Secondary | ICD-10-CM | POA: Diagnosis not present

## 2022-09-02 DIAGNOSIS — R2689 Other abnormalities of gait and mobility: Secondary | ICD-10-CM | POA: Diagnosis not present

## 2022-09-03 DIAGNOSIS — R2689 Other abnormalities of gait and mobility: Secondary | ICD-10-CM | POA: Diagnosis not present

## 2022-09-03 DIAGNOSIS — R29898 Other symptoms and signs involving the musculoskeletal system: Secondary | ICD-10-CM | POA: Diagnosis not present

## 2022-09-03 DIAGNOSIS — R278 Other lack of coordination: Secondary | ICD-10-CM | POA: Diagnosis not present

## 2022-09-03 DIAGNOSIS — M6281 Muscle weakness (generalized): Secondary | ICD-10-CM | POA: Diagnosis not present

## 2022-09-04 DIAGNOSIS — M6281 Muscle weakness (generalized): Secondary | ICD-10-CM | POA: Diagnosis not present

## 2022-09-04 DIAGNOSIS — R2689 Other abnormalities of gait and mobility: Secondary | ICD-10-CM | POA: Diagnosis not present

## 2022-09-04 DIAGNOSIS — R278 Other lack of coordination: Secondary | ICD-10-CM | POA: Diagnosis not present

## 2022-09-04 DIAGNOSIS — R29898 Other symptoms and signs involving the musculoskeletal system: Secondary | ICD-10-CM | POA: Diagnosis not present

## 2022-09-07 DIAGNOSIS — R29898 Other symptoms and signs involving the musculoskeletal system: Secondary | ICD-10-CM | POA: Diagnosis not present

## 2022-09-07 DIAGNOSIS — R278 Other lack of coordination: Secondary | ICD-10-CM | POA: Diagnosis not present

## 2022-09-07 DIAGNOSIS — R2689 Other abnormalities of gait and mobility: Secondary | ICD-10-CM | POA: Diagnosis not present

## 2022-09-07 DIAGNOSIS — M6281 Muscle weakness (generalized): Secondary | ICD-10-CM | POA: Diagnosis not present

## 2022-09-08 DIAGNOSIS — R29898 Other symptoms and signs involving the musculoskeletal system: Secondary | ICD-10-CM | POA: Diagnosis not present

## 2022-09-08 DIAGNOSIS — M6281 Muscle weakness (generalized): Secondary | ICD-10-CM | POA: Diagnosis not present

## 2022-09-08 DIAGNOSIS — R2689 Other abnormalities of gait and mobility: Secondary | ICD-10-CM | POA: Diagnosis not present

## 2022-09-08 DIAGNOSIS — R278 Other lack of coordination: Secondary | ICD-10-CM | POA: Diagnosis not present

## 2022-09-09 DIAGNOSIS — R278 Other lack of coordination: Secondary | ICD-10-CM | POA: Diagnosis not present

## 2022-09-09 DIAGNOSIS — R29898 Other symptoms and signs involving the musculoskeletal system: Secondary | ICD-10-CM | POA: Diagnosis not present

## 2022-09-09 DIAGNOSIS — M6281 Muscle weakness (generalized): Secondary | ICD-10-CM | POA: Diagnosis not present

## 2022-09-09 DIAGNOSIS — R2689 Other abnormalities of gait and mobility: Secondary | ICD-10-CM | POA: Diagnosis not present

## 2022-09-10 DIAGNOSIS — R29898 Other symptoms and signs involving the musculoskeletal system: Secondary | ICD-10-CM | POA: Diagnosis not present

## 2022-09-10 DIAGNOSIS — R278 Other lack of coordination: Secondary | ICD-10-CM | POA: Diagnosis not present

## 2022-09-10 DIAGNOSIS — M6281 Muscle weakness (generalized): Secondary | ICD-10-CM | POA: Diagnosis not present

## 2022-09-10 DIAGNOSIS — R2689 Other abnormalities of gait and mobility: Secondary | ICD-10-CM | POA: Diagnosis not present

## 2022-09-14 DIAGNOSIS — R2689 Other abnormalities of gait and mobility: Secondary | ICD-10-CM | POA: Diagnosis not present

## 2022-09-14 DIAGNOSIS — M6281 Muscle weakness (generalized): Secondary | ICD-10-CM | POA: Diagnosis not present

## 2022-09-14 DIAGNOSIS — R278 Other lack of coordination: Secondary | ICD-10-CM | POA: Diagnosis not present

## 2022-09-14 DIAGNOSIS — R29898 Other symptoms and signs involving the musculoskeletal system: Secondary | ICD-10-CM | POA: Diagnosis not present

## 2022-09-15 DIAGNOSIS — R278 Other lack of coordination: Secondary | ICD-10-CM | POA: Diagnosis not present

## 2022-09-15 DIAGNOSIS — R2689 Other abnormalities of gait and mobility: Secondary | ICD-10-CM | POA: Diagnosis not present

## 2022-09-15 DIAGNOSIS — R29898 Other symptoms and signs involving the musculoskeletal system: Secondary | ICD-10-CM | POA: Diagnosis not present

## 2022-09-15 DIAGNOSIS — M6281 Muscle weakness (generalized): Secondary | ICD-10-CM | POA: Diagnosis not present

## 2022-09-16 ENCOUNTER — Other Ambulatory Visit: Payer: Self-pay | Admitting: Adult Health

## 2022-09-16 DIAGNOSIS — R2689 Other abnormalities of gait and mobility: Secondary | ICD-10-CM | POA: Diagnosis not present

## 2022-09-16 DIAGNOSIS — R29898 Other symptoms and signs involving the musculoskeletal system: Secondary | ICD-10-CM | POA: Diagnosis not present

## 2022-09-16 DIAGNOSIS — M6281 Muscle weakness (generalized): Secondary | ICD-10-CM | POA: Diagnosis not present

## 2022-09-16 DIAGNOSIS — R278 Other lack of coordination: Secondary | ICD-10-CM | POA: Diagnosis not present

## 2022-09-16 MED ORDER — TESTOSTERONE CYPIONATE 200 MG/ML IM SOLN: 200.0000 mg | INTRAMUSCULAR | 0 refills | Status: DC

## 2022-09-18 DIAGNOSIS — M6281 Muscle weakness (generalized): Secondary | ICD-10-CM | POA: Diagnosis not present

## 2022-09-18 DIAGNOSIS — R278 Other lack of coordination: Secondary | ICD-10-CM | POA: Diagnosis not present

## 2022-09-18 DIAGNOSIS — R29898 Other symptoms and signs involving the musculoskeletal system: Secondary | ICD-10-CM | POA: Diagnosis not present

## 2022-09-18 DIAGNOSIS — R2689 Other abnormalities of gait and mobility: Secondary | ICD-10-CM | POA: Diagnosis not present

## 2022-09-21 DIAGNOSIS — R29898 Other symptoms and signs involving the musculoskeletal system: Secondary | ICD-10-CM | POA: Diagnosis not present

## 2022-09-21 DIAGNOSIS — R278 Other lack of coordination: Secondary | ICD-10-CM | POA: Diagnosis not present

## 2022-09-21 DIAGNOSIS — M6281 Muscle weakness (generalized): Secondary | ICD-10-CM | POA: Diagnosis not present

## 2022-09-21 DIAGNOSIS — R2689 Other abnormalities of gait and mobility: Secondary | ICD-10-CM | POA: Diagnosis not present

## 2022-09-22 DIAGNOSIS — R29898 Other symptoms and signs involving the musculoskeletal system: Secondary | ICD-10-CM | POA: Diagnosis not present

## 2022-09-22 DIAGNOSIS — M6281 Muscle weakness (generalized): Secondary | ICD-10-CM | POA: Diagnosis not present

## 2022-09-22 DIAGNOSIS — R2689 Other abnormalities of gait and mobility: Secondary | ICD-10-CM | POA: Diagnosis not present

## 2022-09-22 DIAGNOSIS — R278 Other lack of coordination: Secondary | ICD-10-CM | POA: Diagnosis not present

## 2022-09-23 DIAGNOSIS — R278 Other lack of coordination: Secondary | ICD-10-CM | POA: Diagnosis not present

## 2022-09-23 DIAGNOSIS — M6281 Muscle weakness (generalized): Secondary | ICD-10-CM | POA: Diagnosis not present

## 2022-09-23 DIAGNOSIS — R2689 Other abnormalities of gait and mobility: Secondary | ICD-10-CM | POA: Diagnosis not present

## 2022-09-23 DIAGNOSIS — R29898 Other symptoms and signs involving the musculoskeletal system: Secondary | ICD-10-CM | POA: Diagnosis not present

## 2022-09-24 ENCOUNTER — Encounter: Payer: Self-pay | Admitting: Nurse Practitioner

## 2022-09-24 ENCOUNTER — Non-Acute Institutional Stay: Payer: Medicare Other | Admitting: Nurse Practitioner

## 2022-09-24 VITALS — BP 138/86 | HR 67 | Temp 97.5°F | Ht 66.0 in | Wt 199.8 lb

## 2022-09-24 DIAGNOSIS — R251 Tremor, unspecified: Secondary | ICD-10-CM

## 2022-09-24 DIAGNOSIS — H6123 Impacted cerumen, bilateral: Secondary | ICD-10-CM | POA: Diagnosis not present

## 2022-09-24 DIAGNOSIS — I4819 Other persistent atrial fibrillation: Secondary | ICD-10-CM | POA: Diagnosis not present

## 2022-09-24 DIAGNOSIS — R4189 Other symptoms and signs involving cognitive functions and awareness: Secondary | ICD-10-CM

## 2022-09-24 DIAGNOSIS — F319 Bipolar disorder, unspecified: Secondary | ICD-10-CM

## 2022-09-24 NOTE — Assessment & Plan Note (Signed)
stopped parkinson's medication because of affected his cognition, had resting tremor in left arm/leg in the past

## 2022-09-24 NOTE — Assessment & Plan Note (Signed)
followed by psychiatry, on Lithium, TSH 0.953 07/12/22, 07/16/22 Lithium level 0.6(0.6-1.2), stable except occasionally not sleeping well at night.

## 2022-09-24 NOTE — Assessment & Plan Note (Signed)
SNF [facility] for supportive care, 07/16/22 MMSE 18/30 

## 2022-09-24 NOTE — Progress Notes (Signed)
[facility] [place]  Provider: Marlana Latus, NP  Code Status: DNR Goals of Care:     09/24/2022    1:54 PM  Advanced Directives  Does Patient Have a Medical Advance Directive? Yes  Type of Advance Directive Out of facility DNR (pink MOST or yellow form)  Does patient want to make changes to medical advance directive? No - Patient declined     Chief Complaint  Patient presents with   Acute Visit    Wants ears checked.     HPI: Patient is a 89 y.o. male seen today for an acute visit for fullness in ears. Denied pain in tragus or when auricles moved. Not using hearing aids since it irritates his ear canals.      Parkinson's disease, stopped parkinson's medication because of affected his cognition, had resting tremor in left arm/leg in the past             Cognitive impairment, SNF [facility] for supportive care, 07/16/22 MMSE 18/30             CVA, no focal weakness residual.              Afib, on Diltiazem, Eliquis             Bipolar disorder, followed by psychiatry, on Lithium, TSH 0.953 07/12/22, 07/16/22 Lithium level 0.6(0.6-1.2)             CHF, EF 45-50% 11/2021, not on diuretics, trace edema BLE             Gait abnormality, uses walker             Hyponatremia, Na 136 07/14/22             Hyperbilirubinemia, bilirubin 1.3 07/14/22            Polycythemia,  Hgb 16.3 07/14/22             CKD, Bun/creat 17/1.23 07/14/22             Hypogonadism, takes testosterone inj             Hyperlipidemia, takes Pravastatin             Glaucoma, eye drops.      Past Medical History:  Diagnosis Date   AF (paroxysmal atrial fibrillation) ([place]) 02/16/2018   Atrial fibrillation (HCC)    Atrial fibrillation with RVR ([place]) 02/16/2018   Bipolar 1 disorder ([place])    Bipolar disorder ([place]) 02/17/2018   Dizziness 02/26/2020   DJD (degenerative joint disease)    Dyslipidemia 02/26/2020   Gait instability 02/16/2018   Hypertension    Mitral regurgitation    mild    Polycythemia 02/16/2018   Renal  insufficiency    Stroke [facility])    Tremor 02/16/2018   Tremors of nervous system 01/2018    Past Surgical History:  Procedure Laterality Date   SHOULDER SURGERY      Allergies  Allergen Reactions   Lipitor [Atorvastatin] Other (See Comments)    Joint soreness    Outpatient Encounter Medications as of 09/24/2022  Medication Sig   acetaminophen (TYLENOL) 500 MG tablet Take 500 mg by mouth 2 (two) times daily.    Ascorbic Acid (VITAMIN C PO) Take 1 tablet by mouth daily.   diltiazem (CARDIZEM CD) 120 MG 24 hr capsule TAKE ONE CAPSULE BY MOUTH DAILY   ELIQUIS 5 MG TABS tablet TAKE 1 TABLET BY MOUTH TWICE DAILY.   ibandronate (BONIVA) 150 MG tablet Take  150 mg by mouth every 30 (thirty) days.   latanoprost (XALATAN) 0.005 % ophthalmic solution Place 1 drop into both eyes at bedtime.   lithium carbonate (LITHOBID) 300 MG ER tablet Take 1 tablet (300 mg total) by mouth every evening.   Multiple Vitamin (MULTIVITAMIN WITH MINERALS) TABS tablet Take 1 tablet by mouth daily.   pravastatin (PRAVACHOL) 20 MG tablet Take 1 tablet (20 mg total) by mouth every evening.   testosterone cypionate (DEPOTESTOSTERONE CYPIONATE) 200 MG/ML injection Inject 1 mL (200 mg total) into the muscle every 21 ( twenty-one) days. Every 3 weeks   timolol (TIMOPTIC) 0.5 % ophthalmic solution Place 1 drop into both eyes daily.   No facility-administered encounter medications on file as of 09/24/2022.    Review of Systems:  Review of Systems  Constitutional:  Negative for appetite change, fatigue and fever.  HENT:  Positive for hearing loss. Negative for congestion, ear discharge, ear pain, sinus pressure, sore throat, trouble swallowing and voice change.        Feels full/earwax built up, failed debrox  Eyes:  Negative for visual disturbance.  Respiratory:  Negative for cough, shortness of breath and wheezing.   Cardiovascular:  Positive for leg swelling. Negative for chest pain and palpitations.  Gastrointestinal:   Negative for abdominal pain, constipation, nausea and vomiting.  Genitourinary:  Negative for dysuria and urgency.  Musculoskeletal:  Positive for arthralgias, gait problem and joint swelling.  Skin:  Negative for color change.  Neurological:  Negative for speech difficulty, weakness and headaches.  Psychiatric/Behavioral:  Positive for confusion. Negative for sleep disturbance. The patient is not nervous/anxious.     Health Maintenance  Topic Date Due   Zoster Vaccines- Shingrix (1 of 2) Never done   COVID-19 Vaccine (1) 09/28/2094 (Originally 04/25/1938)   Medicare Annual Wellness (AWV)  07/14/2023   DTaP/Tdap/Td (2 - Td or Tdap) 05/18/2026   Pneumonia Vaccine 49+ Years old  Completed   INFLUENZA VACCINE  Completed   HPV VACCINES  Aged Out    Physical Exam: Vitals:   09/24/22 1353  BP: 138/86  Pulse: 67  Temp: (!) 97.5 F (36.4 C)  SpO2: 98%  Weight: 199 lb 12.8 oz (90.6 kg)  Height: 5\' 6"  (1.676 m)   Body mass index is 32.25 kg/m. Physical Exam Vitals and nursing note reviewed.  Constitutional:      Appearance: Normal appearance.  HENT:     Head: Normocephalic and atraumatic.     Comments: Impacted cerumen removed with ear lavage and curette     Right Ear: Tympanic membrane, ear canal and external ear normal. There is impacted cerumen.     Left Ear: Tympanic membrane, ear canal and external ear normal. There is impacted cerumen.     Nose: Nose normal.     Mouth/Throat:     Mouth: Mucous membranes are moist.  Eyes:     Extraocular Movements: Extraocular movements intact.     Conjunctiva/sclera: Conjunctivae normal.     Pupils: Pupils are equal, round, and reactive to light.  Cardiovascular:     Rate and Rhythm: Normal rate. Rhythm irregular.     Heart sounds: No murmur heard. Pulmonary:     Effort: Pulmonary effort is normal.     Breath sounds: No rales.  Abdominal:     General: Bowel sounds are normal.     Palpations: Abdomen is soft.     Tenderness: There  is no abdominal tenderness.  Musculoskeletal:  General: No deformity.     Cervical back: Normal range of motion and neck supple.     Right lower leg: Edema present.     Left lower leg: Edema present.     Comments: Minimal edema BLE, will think about TED  Skin:    General: Skin is warm and dry.  Neurological:     General: No focal deficit present.     Mental Status: He is alert and oriented to person, place, and time. Mental status is at baseline.     Motor: No weakness.     Gait: Gait abnormal.  Psychiatric:        Mood and Affect: Mood normal.        Behavior: Behavior normal.        Thought Content: Thought content normal.     Labs reviewed: Basic Metabolic Panel: Recent Labs    07/02/22 1132 07/03/22 0816 07/04/22 1101 07/05/22 0309 07/06/22 0406  NA  --  134* 135 134* 135  K  --  4.4 4.1 4.2 3.8  CL  --  102 106 103 105  CO2  --  23 21* 21* 22  GLUCOSE  --  92 113* 90 88  BUN  --  13 15 15 15   CREATININE  --  1.20 1.14 1.19 1.11  CALCIUM  --  9.6 9.1 9.1 9.1  MG  --  1.9 1.9  --  1.9  PHOS  --  2.8  --   --  2.6  TSH 0.953  --   --   --   --    Liver Function Tests: Recent Labs    07/04/22 1101 07/05/22 0309 07/06/22 0406  AST 105* 87* 64*  ALT 38 38 38  ALKPHOS 55 57 58  BILITOT 1.4* 1.5* 1.5*  PROT 6.5 6.5 6.4*  ALBUMIN 3.3* 3.2* 3.1*   No results for input(s): "LIPASE", "AMYLASE" in the last 8760 hours. Recent Labs    07/02/22 1132  AMMONIA 22   CBC: Recent Labs    07/01/22 2346 07/03/22 0816 07/04/22 1101  WBC 12.8* 10.7* 8.9  NEUTROABS 11.2*  --   --   HGB 15.7 16.8 15.9  HCT 49.0 49.6 46.8  MCV 96.5 92.4 91.9  PLT 207 171 187   Lipid Panel: No results for input(s): "CHOL", "HDL", "LDLCALC", "TRIG", "CHOLHDL", "LDLDIRECT" in the last 8760 hours. Lab Results  Component Value Date   HGBA1C  08/24/2009    5.7 (NOTE) The ADA recommends the following therapeutic goal for glycemic control related to Hgb A1c measurement: Goal of  therapy: <6.5 Hgb A1c  Reference: American Diabetes Association: Clinical Practice Recommendations 2010, Diabetes Care, 2010, 33: (Suppl  1).    Procedures since last visit: No results found.  Assessment/Plan Impacted cerumen of both ears Impacted ear wax R+L ear, ear lavage removed cerumen, no TM or external ear canal irritation, infection, or bleeding.   Tremors of nervous system  stopped parkinson's medication because of affected his cognition, had resting tremor in left arm/leg in the past  Cognitive impairment  SNF [facility] for supportive care, 07/16/22 MMSE 18/30  Atrial fibrillation, persistent (HCC) Heart rate is in control,  on Diltiazem, Eliquis  Bipolar disorder ([place]) followed by psychiatry, on Lithium, TSH 0.953 07/12/22, 07/16/22 Lithium level 0.6(0.6-1.2), stable except occasionally not sleeping well at night.     Labs/tests ordered:  none  Next appt:  PRN

## 2022-09-24 NOTE — Assessment & Plan Note (Signed)
Heart rate is in control, on Diltiazem, Eliquis 

## 2022-09-24 NOTE — Assessment & Plan Note (Signed)
Impacted ear wax R+L ear, ear lavage removed cerumen, no TM or external ear canal irritation, infection, or bleeding.

## 2022-09-25 DIAGNOSIS — M6281 Muscle weakness (generalized): Secondary | ICD-10-CM | POA: Diagnosis not present

## 2022-09-25 DIAGNOSIS — R29898 Other symptoms and signs involving the musculoskeletal system: Secondary | ICD-10-CM | POA: Diagnosis not present

## 2022-09-25 DIAGNOSIS — R2689 Other abnormalities of gait and mobility: Secondary | ICD-10-CM | POA: Diagnosis not present

## 2022-09-25 DIAGNOSIS — R278 Other lack of coordination: Secondary | ICD-10-CM | POA: Diagnosis not present

## 2022-09-28 DIAGNOSIS — M6281 Muscle weakness (generalized): Secondary | ICD-10-CM | POA: Diagnosis not present

## 2022-09-28 DIAGNOSIS — R278 Other lack of coordination: Secondary | ICD-10-CM | POA: Diagnosis not present

## 2022-09-28 DIAGNOSIS — R29898 Other symptoms and signs involving the musculoskeletal system: Secondary | ICD-10-CM | POA: Diagnosis not present

## 2022-09-29 ENCOUNTER — Non-Acute Institutional Stay (SKILLED_NURSING_FACILITY): Payer: Medicare Other | Admitting: Family Medicine

## 2022-09-29 DIAGNOSIS — I480 Paroxysmal atrial fibrillation: Secondary | ICD-10-CM

## 2022-09-29 DIAGNOSIS — F319 Bipolar disorder, unspecified: Secondary | ICD-10-CM | POA: Diagnosis not present

## 2022-09-29 DIAGNOSIS — R4189 Other symptoms and signs involving cognitive functions and awareness: Secondary | ICD-10-CM

## 2022-09-29 DIAGNOSIS — R2681 Unsteadiness on feet: Secondary | ICD-10-CM

## 2022-09-29 DIAGNOSIS — N1831 Chronic kidney disease, stage 3a: Secondary | ICD-10-CM

## 2022-09-29 DIAGNOSIS — R29898 Other symptoms and signs involving the musculoskeletal system: Secondary | ICD-10-CM | POA: Diagnosis not present

## 2022-09-29 DIAGNOSIS — M6281 Muscle weakness (generalized): Secondary | ICD-10-CM | POA: Diagnosis not present

## 2022-09-29 DIAGNOSIS — R278 Other lack of coordination: Secondary | ICD-10-CM | POA: Diagnosis not present

## 2022-09-29 NOTE — Progress Notes (Signed)
Provider:  Alain Honey, MD Location:      Place of Service:     PCP: Faustino Congress, NP Patient Care Team: Faustino Congress, NP as PCP - General (Family Medicine) Park Liter, MD as PCP - Cardiology (Cardiology)  Extended Emergency Contact Information Primary Emergency Contact: Odle,[name] Address: [phone]           [address]          [place], [place] [address] [place] of [place] Phone: [phone] Mobile Phone: [phone] Relation: Spouse Secondary Emergency Contact: Darolyn Rua Address: [address]          [place], [place] [address] [place] Phone: [phone] Mobile Phone: [phone] Relation: Son  Code Status:  Goals of Care: Advanced Directive information    09/24/2022    1:54 PM  Advanced Directives  Does Patient Have a Medical Advance Directive? Yes  Type of Advance Directive Out of facility DNR (pink MOST or yellow form)  Does patient want to make changes to medical advance directive? No - Patient declined      No chief complaint on file.   HPI: Patient is a 89 y.o. male seen today for medical management of chronic diseases including Parkinson's, old CVA, A-fib, BiPolar disorder, and hyperlipidemia. He was admitted here after spending a week in acute care with altered mental status in the face of COVID and a ground-level fall.  Rehab was recommended and he has been in rehab for the past several months without much progress Treatment for Parkinson's was discontinued as a result of affecting his cognitive abilities.  MMSE was 18/30. There is a history of bipolar disorder followed by psychiatry.  He takes lithium Also history of A-fib taking diltiazem and Eliquis and symptoms are well and aged.  Past Medical History:  Diagnosis Date   AF (paroxysmal atrial fibrillation) ([facility]) 02/16/2018   Atrial fibrillation ([facility])    Atrial fibrillation with RVR ([facility]) 02/16/2018   Bipolar 1  disorder ([facility])    Bipolar disorder ([facility]) 02/17/2018   Dizziness 02/26/2020   DJD (degenerative joint disease)    Dyslipidemia 02/26/2020   Gait instability 02/16/2018   Hypertension    Mitral regurgitation    mild    Polycythemia 02/16/2018   Renal insufficiency    Stroke [facility])    Tremor 02/16/2018   Tremors of nervous system 01/2018   Past Surgical History:  Procedure Laterality Date   SHOULDER SURGERY      reports that he has quit smoking. He has never used smokeless tobacco. He reports current alcohol use of about 1.0 standard drink of alcohol per week. He reports that he does not use drugs. Social History   Socioeconomic History   Marital status: Married    Spouse name: Not on file   Number of children: Not on file   Years of education: Not on file   Highest education level: Not on file  Occupational History   Not on file  Tobacco Use   Smoking status: Former   Smokeless tobacco: Never  Vaping Use   Vaping Use: Never used  Substance and Sexual Activity   Alcohol use: Yes    Alcohol/week: 1.0 standard drink of alcohol    Types: 1 Glasses of wine per week    Comment: 1 per month   Drug use: No   Sexual activity: Not on file  Other Topics Concern   Not on file  Social History Narrative  Not on file   Social Determinants of Health   Financial Resource Strain: Not on file  Food Insecurity: No Food Insecurity (07/02/2022)   Hunger Vital Sign    Worried About Running Out of Food in the Last Year: Never true    Ran Out of Food in the Last Year: Never true  Transportation Needs: No Transportation Needs (07/02/2022)   PRAPARE - Hydrologist (Medical): No    Lack of Transportation (Non-Medical): No  Physical Activity: Not on file  Stress: Not on file  Social Connections: Not on file  Intimate Partner Violence: Not At Risk (07/02/2022)   Humiliation, Afraid, Rape, and Kick questionnaire    Fear of Current or Ex-Partner: No    Emotionally  Abused: No    Physically Abused: No    Sexually Abused: No    Functional Status Survey:    Family History  Problem Relation Age of Onset   Alcohol abuse Mother    Cancer Father     Health Maintenance  Topic Date Due   Zoster Vaccines- Shingrix (1 of 2) Never done   COVID-19 Vaccine (1) 09/28/2094 (Originally 04/25/1938)   INFLUENZA VACCINE  01/28/2023   Medicare Annual Wellness (AWV)  07/14/2023   DTaP/Tdap/Td (2 - Td or Tdap) 05/18/2026   Pneumonia Vaccine 79+ Years old  Completed   HPV VACCINES  Aged Out    Allergies  Allergen Reactions   Lipitor [Atorvastatin] Other (See Comments)    Joint soreness    Outpatient Encounter Medications as of 09/29/2022  Medication Sig   acetaminophen (TYLENOL) 500 MG tablet Take 500 mg by mouth 2 (two) times daily.    Ascorbic Acid (VITAMIN C PO) Take 1 tablet by mouth daily.   diltiazem (CARDIZEM CD) 120 MG 24 hr capsule TAKE ONE CAPSULE BY MOUTH DAILY   ELIQUIS 5 MG TABS tablet TAKE 1 TABLET BY MOUTH TWICE DAILY.   ibandronate (BONIVA) 150 MG tablet Take 150 mg by mouth every 30 (thirty) days.   latanoprost (XALATAN) 0.005 % ophthalmic solution Place 1 drop into both eyes at bedtime.   lithium carbonate (LITHOBID) 300 MG ER tablet Take 1 tablet (300 mg total) by mouth every evening.   Multiple Vitamin (MULTIVITAMIN WITH MINERALS) TABS tablet Take 1 tablet by mouth daily.   pravastatin (PRAVACHOL) 20 MG tablet Take 1 tablet (20 mg total) by mouth every evening.   testosterone cypionate (DEPOTESTOSTERONE CYPIONATE) 200 MG/ML injection Inject 1 mL (200 mg total) into the muscle every 21 ( twenty-one) days. Every 3 weeks   timolol (TIMOPTIC) 0.5 % ophthalmic solution Place 1 drop into both eyes daily.   No facility-administered encounter medications on file as of 09/29/2022.    Review of Systems  Constitutional: Negative.   HENT: Negative.    Respiratory: Negative.    Cardiovascular: Negative.   Gastrointestinal: Negative.    Musculoskeletal:  Positive for gait problem.  Psychiatric/Behavioral:  Positive for confusion.   All other systems reviewed and are negative.   There were no vitals filed for this visit. There is no height or weight on file to calculate BMI. Physical Exam Vitals and nursing note reviewed.  Constitutional:      Appearance: Normal appearance.  HENT:     Head: Normocephalic.  Cardiovascular:     Rate and Rhythm: Normal rate. Rhythm irregular.  Pulmonary:     Effort: Pulmonary effort is normal.     Breath sounds: Normal breath sounds.  Musculoskeletal:  Right lower leg: Edema present.  Skin:    General: Skin is dry.  Neurological:     General: No focal deficit present.     Mental Status: He is alert and oriented to person, place, and time.     Gait: Gait abnormal.  Psychiatric:        Mood and Affect: Mood normal.        Behavior: Behavior normal.     Labs reviewed: Basic Metabolic Panel: Recent Labs    07/03/22 0816 07/04/22 1101 07/05/22 0309 07/06/22 0406  NA 134* 135 134* 135  K 4.4 4.1 4.2 3.8  CL 102 106 103 105  CO2 23 21* 21* 22  GLUCOSE 92 113* 90 88  BUN 13 15 15 15   CREATININE 1.20 1.14 1.19 1.11  CALCIUM 9.6 9.1 9.1 9.1  MG 1.9 1.9  --  1.9  PHOS 2.8  --   --  2.6   Liver Function Tests: Recent Labs    07/04/22 1101 07/05/22 0309 07/06/22 0406  AST 105* 87* 64*  ALT 38 38 38  ALKPHOS 55 57 58  BILITOT 1.4* 1.5* 1.5*  PROT 6.5 6.5 6.4*  ALBUMIN 3.3* 3.2* 3.1*   No results for input(s): "LIPASE", "AMYLASE" in the last 8760 hours. Recent Labs    07/02/22 1132  AMMONIA 22   CBC: Recent Labs    07/01/22 2346 07/03/22 0816 07/04/22 1101  WBC 12.8* 10.7* 8.9  NEUTROABS 11.2*  --   --   HGB 15.7 16.8 15.9  HCT 49.0 49.6 46.8  MCV 96.5 92.4 91.9  PLT 207 171 187   Cardiac Enzymes: Recent Labs    07/03/22 0816 07/04/22 1101 07/05/22 0309  CKTOTAL 3,117* 653* 279   BNP: Invalid input(s): "POCBNP" Lab Results  Component  Value Date   HGBA1C  08/24/2009    5.7 (NOTE) The ADA recommends the following therapeutic goal for glycemic control related to Hgb A1c measurement: Goal of therapy: <6.5 Hgb A1c  Reference: American Diabetes Association: Clinical Practice Recommendations 2010, Diabetes Care, 2010, 33: (Suppl  1).   Lab Results  Component Value Date   TSH 0.953 07/02/2022   Lab Results  Component Value Date   [id number] 07/02/2022   No results found for: "FOLATE" No results found for: "IRON", "TIBC", "FERRITIN"  Imaging and Procedures obtained prior to SNF admission: CT Head Wo Contrast  Result Date: 07/01/2022 CLINICAL DATA:  Head and neck trauma. EXAM: CT HEAD WITHOUT CONTRAST CT CERVICAL SPINE WITHOUT CONTRAST TECHNIQUE: Multidetector CT imaging of the head and cervical spine was performed following the standard protocol without intravenous contrast. Multiplanar CT image reconstructions of the cervical spine were also generated. RADIATION DOSE REDUCTION: This exam was performed according to the departmental dose-optimization program which includes automated exposure control, adjustment of the mA and/or kV according to patient size and/or use of iterative reconstruction technique. COMPARISON:  CT examination dated February 16, 2018 FINDINGS: CT HEAD FINDINGS Brain: No evidence of acute infarction, hemorrhage, extra-axial collection or mass lesion/mass effect. Prominence of the ventricles and sulci secondary to moderate cerebral volume loss. Diffuse low-attenuation of the periventricular white matter presumed advanced chronic microvascular ischemic changes of the white matter. Prominence of the ventricles disproportionate to the volume loss concerning for normal pressure hydrocephalus. Vascular: No hyperdense vessel or unexpected calcification. Skull: Normal. Negative for fracture or focal lesion. Sinuses/Orbits: No acute finding. Other: None. CT CERVICAL SPINE FINDINGS Alignment: Straightening of the cervical  spine. Skull base and vertebrae: No  acute fracture. No primary bone lesion or focal pathologic process. Soft tissues and spinal canal: No prevertebral fluid or swelling. No visible canal hematoma. Disc levels: Multilevel degenerate disc disease with disc height loss and marginal osteophytes with associated uncovertebral joint and facet joint arthropathy. C2-C3:  No significant findings. C3-C4: Disc height loss and uncovertebral joint arthropathy with mild right and moderate left neural foraminal stenosis. Mild left facet joint arthropathy. C4-C5: Disc height loss and marked uncovertebral joint arthropathy with moderate-to-severe bilateral neural foraminal stenosis. C5-C6: Disc height loss and uncovertebral joint arthropathy with moderate right and severe left neural foraminal stenosis. C6-C7: Disc height loss and uncovertebral joint arthropathy with mild left neural foraminal stenosis. C7-T1: Disc height loss and facet joint arthropathy. No significant neural foraminal stenosis. Upper chest: Negative. Other: None IMPRESSION: CT HEAD: 1. No acute intracranial abnormality. 2. Moderate cerebral volume loss and advanced chronic microvascular ischemic changes of the white matter. 3. Prominence of the ventricles more than expected for the cerebral volume loss concerning for normal pressure hydrocephalus. CT CERVICAL SPINE: 1. No acute fracture or traumatic subluxation. 2. Multilevel degenerate disc disease with associated uncovertebral joint and facet joint arthropathy. Electronically Signed   By: Keane Police D.O.   On: 07/01/2022 23:25   CT Cervical Spine Wo Contrast  Result Date: 07/01/2022 CLINICAL DATA:  Head and neck trauma. EXAM: CT HEAD WITHOUT CONTRAST CT CERVICAL SPINE WITHOUT CONTRAST TECHNIQUE: Multidetector CT imaging of the head and cervical spine was performed following the standard protocol without intravenous contrast. Multiplanar CT image reconstructions of the cervical spine were also generated.  RADIATION DOSE REDUCTION: This exam was performed according to the departmental dose-optimization program which includes automated exposure control, adjustment of the mA and/or kV according to patient size and/or use of iterative reconstruction technique. COMPARISON:  CT examination dated February 16, 2018 FINDINGS: CT HEAD FINDINGS Brain: No evidence of acute infarction, hemorrhage, extra-axial collection or mass lesion/mass effect. Prominence of the ventricles and sulci secondary to moderate cerebral volume loss. Diffuse low-attenuation of the periventricular white matter presumed advanced chronic microvascular ischemic changes of the white matter. Prominence of the ventricles disproportionate to the volume loss concerning for normal pressure hydrocephalus. Vascular: No hyperdense vessel or unexpected calcification. Skull: Normal. Negative for fracture or focal lesion. Sinuses/Orbits: No acute finding. Other: None. CT CERVICAL SPINE FINDINGS Alignment: Straightening of the cervical spine. Skull base and vertebrae: No acute fracture. No primary bone lesion or focal pathologic process. Soft tissues and spinal canal: No prevertebral fluid or swelling. No visible canal hematoma. Disc levels: Multilevel degenerate disc disease with disc height loss and marginal osteophytes with associated uncovertebral joint and facet joint arthropathy. C2-C3:  No significant findings. C3-C4: Disc height loss and uncovertebral joint arthropathy with mild right and moderate left neural foraminal stenosis. Mild left facet joint arthropathy. C4-C5: Disc height loss and marked uncovertebral joint arthropathy with moderate-to-severe bilateral neural foraminal stenosis. C5-C6: Disc height loss and uncovertebral joint arthropathy with moderate right and severe left neural foraminal stenosis. C6-C7: Disc height loss and uncovertebral joint arthropathy with mild left neural foraminal stenosis. C7-T1: Disc height loss and facet joint arthropathy.  No significant neural foraminal stenosis. Upper chest: Negative. Other: None IMPRESSION: CT HEAD: 1. No acute intracranial abnormality. 2. Moderate cerebral volume loss and advanced chronic microvascular ischemic changes of the white matter. 3. Prominence of the ventricles more than expected for the cerebral volume loss concerning for normal pressure hydrocephalus. CT CERVICAL SPINE: 1. No acute fracture or traumatic subluxation. 2.  Multilevel degenerate disc disease with associated uncovertebral joint and facet joint arthropathy. Electronically Signed   By: Keane Police D.O.   On: 07/01/2022 23:25   DG Chest 1 View  Result Date: 07/01/2022 CLINICAL DATA:  Altered mental status. EXAM: CHEST  1 VIEW COMPARISON:  Chest x-ray 09/06/2013 FINDINGS: The heart and mediastinal contours are unchanged. Aortic calcification. No focal consolidation. No pulmonary edema. No pleural effusion. No pneumothorax. No acute osseous abnormality. Severe degenerative changes of the bilateral shoulders. IMPRESSION: No active disease. Electronically Signed   By: Iven Finn M.D.   On: 07/01/2022 22:44    Assessment/Plan 1. AF (paroxysmal atrial fibrillation) Symptoms managed   2. Bipolar 1 disorrder Continues with lithium and followed by psychiatry  3. Stage 3a chronic kidney disease Most recent BUN/creatinine were 17 and 1.23 respectively  4. Cognitive impairment He is usually confused but is able to carry on conversation and answers questions appropriately  5. Gait instability Continues with physical therapy he does not walk without a walker.  Been no recent falls    Family/ staff Communication:   Labs/tests ordered:  Lillette Boxer. Sabra Heck, [place] [address] [place], [place] Office [phone]

## 2022-10-01 DIAGNOSIS — R29898 Other symptoms and signs involving the musculoskeletal system: Secondary | ICD-10-CM | POA: Diagnosis not present

## 2022-10-01 DIAGNOSIS — M6281 Muscle weakness (generalized): Secondary | ICD-10-CM | POA: Diagnosis not present

## 2022-10-01 DIAGNOSIS — R278 Other lack of coordination: Secondary | ICD-10-CM | POA: Diagnosis not present

## 2022-10-05 DIAGNOSIS — R278 Other lack of coordination: Secondary | ICD-10-CM | POA: Diagnosis not present

## 2022-10-05 DIAGNOSIS — R29898 Other symptoms and signs involving the musculoskeletal system: Secondary | ICD-10-CM | POA: Diagnosis not present

## 2022-10-05 DIAGNOSIS — M6281 Muscle weakness (generalized): Secondary | ICD-10-CM | POA: Diagnosis not present

## 2022-10-06 DIAGNOSIS — R278 Other lack of coordination: Secondary | ICD-10-CM | POA: Diagnosis not present

## 2022-10-06 DIAGNOSIS — M6281 Muscle weakness (generalized): Secondary | ICD-10-CM | POA: Diagnosis not present

## 2022-10-06 DIAGNOSIS — R29898 Other symptoms and signs involving the musculoskeletal system: Secondary | ICD-10-CM | POA: Diagnosis not present

## 2022-10-08 DIAGNOSIS — M6281 Muscle weakness (generalized): Secondary | ICD-10-CM | POA: Diagnosis not present

## 2022-10-08 DIAGNOSIS — R29898 Other symptoms and signs involving the musculoskeletal system: Secondary | ICD-10-CM | POA: Diagnosis not present

## 2022-10-08 DIAGNOSIS — R278 Other lack of coordination: Secondary | ICD-10-CM | POA: Diagnosis not present

## 2022-10-12 DIAGNOSIS — M6281 Muscle weakness (generalized): Secondary | ICD-10-CM | POA: Diagnosis not present

## 2022-10-12 DIAGNOSIS — R278 Other lack of coordination: Secondary | ICD-10-CM | POA: Diagnosis not present

## 2022-10-12 DIAGNOSIS — R29898 Other symptoms and signs involving the musculoskeletal system: Secondary | ICD-10-CM | POA: Diagnosis not present

## 2022-10-13 DIAGNOSIS — R29898 Other symptoms and signs involving the musculoskeletal system: Secondary | ICD-10-CM | POA: Diagnosis not present

## 2022-10-13 DIAGNOSIS — M6281 Muscle weakness (generalized): Secondary | ICD-10-CM | POA: Diagnosis not present

## 2022-10-13 DIAGNOSIS — R278 Other lack of coordination: Secondary | ICD-10-CM | POA: Diagnosis not present

## 2022-10-15 ENCOUNTER — Encounter: Payer: Self-pay | Admitting: Nurse Practitioner

## 2022-10-15 DIAGNOSIS — B379 Candidiasis, unspecified: Secondary | ICD-10-CM | POA: Insufficient documentation

## 2022-10-30 ENCOUNTER — Encounter: Payer: Self-pay | Admitting: Nurse Practitioner

## 2022-10-30 ENCOUNTER — Non-Acute Institutional Stay (SKILLED_NURSING_FACILITY): Payer: Medicare Other | Admitting: Nurse Practitioner

## 2022-10-30 DIAGNOSIS — M810 Age-related osteoporosis without current pathological fracture: Secondary | ICD-10-CM | POA: Insufficient documentation

## 2022-10-30 DIAGNOSIS — N1831 Chronic kidney disease, stage 3a: Secondary | ICD-10-CM

## 2022-10-30 DIAGNOSIS — M159 Polyosteoarthritis, unspecified: Secondary | ICD-10-CM

## 2022-10-30 DIAGNOSIS — R4189 Other symptoms and signs involving cognitive functions and awareness: Secondary | ICD-10-CM | POA: Diagnosis not present

## 2022-10-30 DIAGNOSIS — I4819 Other persistent atrial fibrillation: Secondary | ICD-10-CM | POA: Diagnosis not present

## 2022-10-30 DIAGNOSIS — E291 Testicular hypofunction: Secondary | ICD-10-CM

## 2022-10-30 DIAGNOSIS — F319 Bipolar disorder, unspecified: Secondary | ICD-10-CM

## 2022-10-30 DIAGNOSIS — D751 Secondary polycythemia: Secondary | ICD-10-CM

## 2022-10-30 NOTE — Assessment & Plan Note (Signed)
Heart rate is in control, on Diltiazem, Eliquis 

## 2022-10-30 NOTE — Assessment & Plan Note (Signed)
followed by psychiatry, on Lithium, TSH 0.953 07/12/22, 07/16/22 Lithium level 0.6(0.6-1.2) 

## 2022-10-30 NOTE — Assessment & Plan Note (Signed)
bilirubin 1.4 07/16/22

## 2022-10-30 NOTE — Assessment & Plan Note (Signed)
takes Tylenol    

## 2022-10-30 NOTE — Assessment & Plan Note (Signed)
Bun/creat 21/1.1 08/04/22

## 2022-10-30 NOTE — Assessment & Plan Note (Signed)
taking Alendronate tscore -2.9 2017, -0.9 01/19/19, will repeat DEXA

## 2022-10-30 NOTE — Assessment & Plan Note (Signed)
SNF [facility] for supportive care, 07/16/22 MMSE 18/30 

## 2022-10-30 NOTE — Assessment & Plan Note (Signed)
takes testosterone inj 

## 2022-10-30 NOTE — Progress Notes (Signed)
Location:  [facility] Room Number: 18-A Place of Service:  SNF [phone]) Provider:  Jazilyn Siegenthaler Dicie Beam    Patient Care Team: Moshe Cipro, NP as PCP - General (Family Medicine) Georgeanna Lea, MD as PCP - Cardiology (Cardiology)  Extended Emergency Contact Information Primary Emergency Contact: Richner,[name] Address: [phone].           [facility] [address]          [place], [place] [address] [name] of [place] Home Phone: [phone] Mobile Phone: [phone] Relation: Spouse Secondary Emergency Contact: [name] Address: [address].          [place], [place] [address] [place] of [place] Home Phone: [phone] Mobile Phone: [phone] Relation: Son  Code Status:  DNR Goals of care: Advanced Directive information    10/30/2022   10:59 AM  Advanced Directives  Does Patient Have a Medical Advance Directive? Yes  Type of Advance Directive Out of facility DNR (pink MOST or yellow form)  Does patient want to make changes to medical advance directive? No - Patient declined  Pre-existing out of facility DNR order (yellow form or pink MOST form) Yellow form placed in chart (order not valid for inpatient use)     Chief Complaint  Patient presents with   Routine    HPI:  Pt is a 89 y.o. male seen today for medical management of chronic diseases.     Parkinson's disease, stopped parkinson's medication because of affected his cognition, had resting tremor in left arm/leg in the past  OP taking Alendronate tscore -2.9 2017, -0.9 01/19/19, will repeat DEXA  OA takes Tylenol.              Cognitive impairment, SNF [facility] for supportive care, 07/16/22 MMSE 18/30             CVA, no focal weakness residual.              Afib, on Diltiazem, Eliquis             Bipolar disorder, followed by psychiatry, on Lithium, TSH 0.953 07/12/22, 07/16/22 Lithium level 0.6(0.6-1.2)             CHF, EF 45-50% 11/2021, not on diuretics,  trace edema BLE             Gait abnormality, uses walker             Hyponatremia, Na 138 08/04/22             Hyperbilirubinemia, bilirubin 1.4 07/16/22            Polycythemia,  Hgb 16.3 07/14/22             CKD, Bun/creat 21/1.1 08/04/22             Hypogonadism, takes testosterone inj             Hyperlipidemia, takes Pravastatin             Glaucoma, eye drops.                    Past Medical History:  Diagnosis Date   AF (paroxysmal atrial fibrillation) (HCC) 02/16/2018   Atrial fibrillation (HCC)    Atrial fibrillation with RVR (HCC) 02/16/2018   Bipolar 1 disorder (HCC)    Bipolar disorder (HCC) 02/17/2018   Dizziness 02/26/2020   DJD (degenerative joint disease)    Dyslipidemia 02/26/2020   Gait instability 02/16/2018   Hypertension  Mitral regurgitation    mild    Polycythemia 02/16/2018   Renal insufficiency    Stroke [facility])    Tremor 02/16/2018   Tremors of nervous system 01/2018   Past Surgical History:  Procedure Laterality Date   SHOULDER SURGERY      Allergies  Allergen Reactions   Lipitor [Atorvastatin] Other (See Comments)    Joint soreness    Outpatient Encounter Medications as of 10/30/2022  Medication Sig   acetaminophen (TYLENOL) 500 MG tablet Take 500 mg by mouth 2 (two) times daily.    alendronate (FOSAMAX) 70 MG tablet Take 70 mg by mouth once a week. Take with a full glass of water on an empty stomach.   Ascorbic Acid (VITAMIN C PO) Take 1 tablet by mouth daily.   diltiazem (CARDIZEM CD) 120 MG 24 hr capsule TAKE ONE CAPSULE BY MOUTH DAILY   ELIQUIS 5 MG TABS tablet TAKE 1 TABLET BY MOUTH TWICE DAILY.   latanoprost (XALATAN) 0.005 % ophthalmic solution Place 1 drop into both eyes at bedtime.   lithium carbonate (LITHOBID) 300 MG ER tablet Take 1 tablet (300 mg total) by mouth every evening.   Multiple Vitamin (MULTIVITAMIN WITH MINERALS) TABS tablet Take 1 tablet by mouth daily.   pravastatin (PRAVACHOL) 20 MG tablet Take 1 tablet (20 mg total) by  mouth every evening.   testosterone cypionate (DEPOTESTOSTERONE CYPIONATE) 200 MG/ML injection Inject 1 mL (200 mg total) into the muscle every 21 ( twenty-one) days. Every 3 weeks   timolol (TIMOPTIC) 0.5 % ophthalmic solution Place 1 drop into both eyes daily.   [DISCONTINUED] ibandronate (BONIVA) 150 MG tablet Take 150 mg by mouth every 30 (thirty) days.   No facility-administered encounter medications on file as of 10/30/2022.    Review of Systems  Constitutional:  Negative for appetite change, fatigue and fever.  HENT:  Positive for hearing loss. Negative for congestion and trouble swallowing.   Eyes:  Negative for visual disturbance.  Respiratory:  Negative for cough, shortness of breath and wheezing.   Cardiovascular:  Positive for leg swelling. Negative for chest pain and palpitations.  Gastrointestinal:  Negative for abdominal pain, constipation, nausea and vomiting.  Genitourinary:  Negative for dysuria and urgency.  Musculoskeletal:  Positive for arthralgias, gait problem and joint swelling.  Skin:  Negative for color change.  Neurological:  Negative for speech difficulty, weakness and headaches.  Psychiatric/Behavioral:  Positive for confusion. Negative for sleep disturbance. The patient is not nervous/anxious.     Immunization History  Administered Date(s) Administered   Influenza-Unspecified 04/15/2022   PPD Test 07/08/2022   Pneumococcal Conjugate-13 04/26/2014   Pneumococcal Polysaccharide-23 04/14/2005   Tdap 05/18/2016   Pertinent  Health Maintenance Due  Topic Date Due   INFLUENZA VACCINE  01/28/2023      07/08/2022    9:22 AM 07/10/2022   11:13 AM 08/03/2022    4:30 PM 08/25/2022   11:49 AM 09/24/2022    1:55 PM  Fall Risk  Falls in the past year?  1 1 1  0  Was there an injury with Fall?  1 1 0 0  Fall Risk Category Calculator  3 3 1  0  Fall Risk Category (Retired)  High     (RETIRED) Patient Fall Risk Level High fall risk High fall risk     Patient at Risk  for Falls Due to  History of fall(s);Impaired balance/gait;Impaired mobility History of fall(s);Impaired balance/gait;Impaired mobility No Fall Risks   Fall risk Follow up  Falls  evaluation completed Falls evaluation completed Falls evaluation completed    Functional Status Survey:    Vitals:   10/30/22 1036  BP: 124/83  Pulse: 63  Resp: 20  Temp: (!) 97.4 F (36.3 C)  SpO2: 92%  Weight: 197 lb 11.2 oz (89.7 kg)  Height: 5\' 6"  (1.676 m)   Body mass index is 31.91 kg/m. Physical Exam Vitals and nursing note reviewed.  Constitutional:      Appearance: Normal appearance.  HENT:     Head: Normocephalic and atraumatic.     Comments: Impacted cerumen removed with ear lavage and curette     Nose: Nose normal.     Mouth/Throat:     Mouth: Mucous membranes are moist.  Eyes:     Extraocular Movements: Extraocular movements intact.     Conjunctiva/sclera: Conjunctivae normal.     Pupils: Pupils are equal, round, and reactive to light.  Cardiovascular:     Rate and Rhythm: Normal rate. Rhythm irregular.     Heart sounds: No murmur heard. Pulmonary:     Effort: Pulmonary effort is normal.     Breath sounds: No rales.  Abdominal:     General: Bowel sounds are normal.     Palpations: Abdomen is soft.     Tenderness: There is no abdominal tenderness.  Musculoskeletal:     Cervical back: Normal range of motion and neck supple.     Right lower leg: Edema present.     Left lower leg: Edema present.     Comments: Minimal edema BLE, will think about TED  Skin:    General: Skin is warm and dry.  Neurological:     General: No focal deficit present.     Mental Status: He is alert and oriented to person, place, and time. Mental status is at baseline.     Motor: No weakness.     Gait: Gait abnormal.  Psychiatric:        Mood and Affect: Mood normal.        Behavior: Behavior normal.        Thought Content: Thought content normal.     Labs reviewed: Recent Labs    07/03/22 0816  07/04/22 1101 07/05/22 0309 07/06/22 0406 08/04/22 0000  NA 134* 135 134* 135 138  K 4.4 4.1 4.2 3.8 4.8  CL 102 106 103 105 105  CO2 23 21* 21* 22 21  GLUCOSE 92 113* 90 88  --   BUN 13 15 15 15 21   CREATININE 1.20 1.14 1.19 1.11 1.1  CALCIUM 9.6 9.1 9.1 9.1 10.1  MG 1.9 1.9  --  1.9  --   PHOS 2.8  --   --  2.6  --    Recent Labs    07/04/22 1101 07/05/22 0309 07/06/22 0406 07/16/22 0000  AST 105* 87* 64* 26  ALT 38 38 38 27  ALKPHOS 55 57 58 75  BILITOT 1.4* 1.5* 1.5*  --   PROT 6.5 6.5 6.4*  --   ALBUMIN 3.3* 3.2* 3.1* 3.8   Recent Labs    07/01/22 2346 07/03/22 0816 07/04/22 1101 07/16/22 0000  WBC 12.8* 10.7* 8.9 8.6  NEUTROABS 11.2*  --   --  5,848.00  HGB 15.7 16.8 15.9 16.2  HCT 49.0 49.6 46.8 48  MCV 96.5 92.4 91.9  --   PLT 207 171 187 420*   Lab Results  Component Value Date   TSH 0.953 07/02/2022   Lab Results  Component Value Date  HGBA1C  08/24/2009    5.7 (NOTE) The ADA recommends the following therapeutic goal for glycemic control related to Hgb A1c measurement: Goal of therapy: <6.5 Hgb A1c  Reference: American Diabetes Association: Clinical Practice Recommendations 2010, Diabetes Care, 2010, 33: (Suppl  1).   Lab Results  Component Value Date   CHOL 111 09/02/2020   HDL 34 (L) 09/02/2020   LDLCALC 51 09/02/2020   LDLDIRECT 55 09/02/2020   TRIG 154 (H) 09/02/2020   CHOLHDL 3.3 09/02/2020    Significant Diagnostic Results in last 30 days:  No results found.  Assessment/Plan Osteoporosis taking Alendronate tscore -2.9 2017, -0.9 01/19/19, will repeat DEXA  Osteoarthritis, multiple sites takes Tylenol.   Cognitive impairment  SNF [facility] for supportive care, 07/16/22 MMSE 18/30  Atrial fibrillation, persistent (HCC) Heart rate is in control, on Diltiazem, Eliquis  Bipolar disorder (HCC)  followed by psychiatry, on Lithium, TSH 0.953 07/12/22, 07/16/22 Lithium level 0.6(0.6-1.2)  Hyperbilirubinemia  bilirubin 1.4  07/16/22  Polycythemia Hgb 16.3 07/14/22  CKD (chronic kidney disease) stage 3, GFR 30-59 ml/min (HCC) Bun/creat 21/1.1 08/04/22  Hypogonadism in male  takes testosterone inj     Family/ staff Communication: plan of care reviewed with the patient and charge nurse.   Labs/tests ordered:  none  Time spend 35 minutes.

## 2022-10-30 NOTE — Assessment & Plan Note (Signed)
Hgb 16.3 07/14/22 

## 2022-11-17 DIAGNOSIS — N400 Enlarged prostate without lower urinary tract symptoms: Secondary | ICD-10-CM | POA: Diagnosis not present

## 2022-11-17 DIAGNOSIS — E291 Testicular hypofunction: Secondary | ICD-10-CM | POA: Diagnosis not present

## 2022-11-17 DIAGNOSIS — M810 Age-related osteoporosis without current pathological fracture: Secondary | ICD-10-CM | POA: Diagnosis not present

## 2022-11-19 ENCOUNTER — Non-Acute Institutional Stay (SKILLED_NURSING_FACILITY): Payer: Medicare Other | Admitting: Nurse Practitioner

## 2022-11-19 ENCOUNTER — Encounter: Payer: Self-pay | Admitting: Nurse Practitioner

## 2022-11-19 VITALS — BP 118/80 | HR 66 | Temp 97.9°F | Resp 18 | Ht 66.0 in | Wt 190.0 lb

## 2022-11-19 DIAGNOSIS — H6123 Impacted cerumen, bilateral: Secondary | ICD-10-CM | POA: Diagnosis not present

## 2022-11-19 DIAGNOSIS — N1831 Chronic kidney disease, stage 3a: Secondary | ICD-10-CM

## 2022-11-19 DIAGNOSIS — D751 Secondary polycythemia: Secondary | ICD-10-CM

## 2022-11-19 DIAGNOSIS — I5042 Chronic combined systolic (congestive) and diastolic (congestive) heart failure: Secondary | ICD-10-CM | POA: Diagnosis not present

## 2022-11-19 DIAGNOSIS — R4189 Other symptoms and signs involving cognitive functions and awareness: Secondary | ICD-10-CM

## 2022-11-19 DIAGNOSIS — F319 Bipolar disorder, unspecified: Secondary | ICD-10-CM

## 2022-11-19 DIAGNOSIS — I4821 Permanent atrial fibrillation: Secondary | ICD-10-CM

## 2022-11-19 NOTE — Assessment & Plan Note (Signed)
Ear lavage, cerumen removed.

## 2022-11-19 NOTE — Assessment & Plan Note (Signed)
followed by psychiatry, on Lithium, TSH 0.953 07/12/22, 07/16/22 Lithium level 0.6(0.6-1.2) 

## 2022-11-19 NOTE — Assessment & Plan Note (Signed)
EF 45-50% 11/2021, not on diuretics, trace edema BLE 

## 2022-11-19 NOTE — Assessment & Plan Note (Signed)
on Diltiazem, Eliquis

## 2022-11-19 NOTE — Progress Notes (Signed)
Location:   [facility]   Place of Service:  Clinic (12) Provider: Arna Snipe Yitzchok Carriger NP  Moshe Cipro, NP  Patient Care Team: Moshe Cipro, NP as PCP - General (Family Medicine) Georgeanna Lea, MD as PCP - Cardiology (Cardiology)  Extended Emergency Contact Information Primary Emergency Contact: Boylen,[name] Address: [phone].           [facility] [address]          [place], [place] [address] [name] of [place] Home Phone: [phone] Mobile Phone: [phone] Relation: Spouse Secondary Emergency Contact: [name] Address: [address].          [place], [place] [address] [place] of [place] Home Phone: [phone] Mobile Phone: [phone] Relation: Son  Code Status: DNR Goals of care: Advanced Directive information    10/30/2022   10:59 AM  Advanced Directives  Does Patient Have a Medical Advance Directive? Yes  Type of Advance Directive Out of facility DNR (pink MOST or yellow form)  Does patient want to make changes to medical advance directive? No - Patient declined  Pre-existing out of facility DNR order (yellow form or pink MOST form) Yellow form placed in chart (order not valid for inpatient use)     Chief Complaint  Patient presents with   Acute Visit    Patient is being seen for an ear lavage    HPI:  Pt is a 89 y.o. male seen today for an acute visit for impacted ear cerumen.    Parkinson's disease, stopped parkinson's medication because of affected his cognition, had resting tremor in left arm/leg in the past             OP taking Alendronate tscore -2.9 2017, -0.9 01/19/19, will repeat DEXA             OA takes Tylenol.              Cognitive impairment, SNF [facility] for supportive care, 07/16/22 MMSE 18/30             CVA, no focal weakness residual.              Afib, on Diltiazem, Eliquis             Bipolar disorder, followed by psychiatry, on Lithium, TSH 0.953 07/12/22, 07/16/22 Lithium level 0.6(0.6-1.2)              CHF, EF 45-50% 11/2021, not on diuretics, trace edema BLE             Gait abnormality, uses walker             Hyponatremia, Na 138 08/04/22             Hyperbilirubinemia, bilirubin 1.4 07/16/22            Polycythemia,  Hgb 16.3 07/14/22             CKD, Bun/creat 21/1.1 08/04/22             Hypogonadism, takes testosterone inj             Hyperlipidemia, takes Pravastatin             Glaucoma, eye drops.                      Past Medical History:  Diagnosis Date   AF (paroxysmal atrial fibrillation) (HCC) 02/16/2018   Atrial fibrillation (HCC)    Atrial fibrillation with RVR (HCC) 02/16/2018  Bipolar 1 disorder (HCC)    Bipolar disorder (HCC) 02/17/2018   Dizziness 02/26/2020   DJD (degenerative joint disease)    Dyslipidemia 02/26/2020   Gait instability 02/16/2018   Hypertension    Mitral regurgitation    mild    Polycythemia 02/16/2018   Renal insufficiency    Stroke [facility])    Tremor 02/16/2018   Tremors of nervous system 01/2018   Past Surgical History:  Procedure Laterality Date   SHOULDER SURGERY      Allergies  Allergen Reactions   Lipitor [Atorvastatin] Other (See Comments)    Joint soreness    Allergies as of 11/19/2022       Reactions   Lipitor [atorvastatin] Other (See Comments)   Joint soreness        Medication List        Accurate as of Nov 19, 2022  1:31 PM. If you have any questions, ask your nurse or doctor.          acetaminophen 500 MG tablet Commonly known as: TYLENOL Take 500 mg by mouth 2 (two) times daily.   alendronate 70 MG tablet Commonly known as: FOSAMAX Take 70 mg by mouth once a week. Take with a full glass of water on an empty stomach.   diltiazem 120 MG 24 hr capsule Commonly known as: CARDIZEM CD TAKE ONE CAPSULE BY MOUTH DAILY   Eliquis 5 MG Tabs tablet Generic drug: apixaban TAKE 1 TABLET BY MOUTH TWICE DAILY.   latanoprost 0.005 % ophthalmic solution Commonly known as: XALATAN Place 1 drop into both eyes at  bedtime.   lithium carbonate 300 MG ER tablet Commonly known as: LITHOBID Take 1 tablet (300 mg total) by mouth every evening.   multivitamin with minerals Tabs tablet Take 1 tablet by mouth daily.   pravastatin 20 MG tablet Commonly known as: PRAVACHOL Take 1 tablet (20 mg total) by mouth every evening.   testosterone cypionate 200 MG/ML injection Commonly known as: DEPOTESTOSTERONE CYPIONATE Inject 1 mL (200 mg total) into the muscle every 21 ( twenty-one) days. Every 3 weeks   timolol 0.5 % ophthalmic solution Commonly known as: TIMOPTIC Place 1 drop into both eyes daily.   VITAMIN C PO Take 1 tablet by mouth daily.        Review of Systems  Constitutional:  Negative for appetite change, fatigue and fever.  HENT:  Positive for hearing loss. Negative for congestion and trouble swallowing.   Eyes:  Negative for visual disturbance.  Respiratory:  Negative for cough, shortness of breath and wheezing.   Cardiovascular:  Positive for leg swelling. Negative for chest pain and palpitations.  Gastrointestinal:  Negative for abdominal pain, constipation, nausea and vomiting.  Genitourinary:  Negative for dysuria and urgency.  Musculoskeletal:  Positive for arthralgias, gait problem and joint swelling.  Skin:  Negative for color change.  Neurological:  Negative for speech difficulty, weakness and headaches.  Psychiatric/Behavioral:  Positive for confusion. Negative for sleep disturbance. The patient is not nervous/anxious.     Immunization History  Administered Date(s) Administered   Influenza-Unspecified 04/15/2022   PPD Test 07/08/2022   Pneumococcal Conjugate-13 04/26/2014   Pneumococcal Polysaccharide-23 04/14/2005   Tdap 05/18/2016   Pertinent  Health Maintenance Due  Topic Date Due   INFLUENZA VACCINE  01/28/2023      07/08/2022    9:22 AM 07/10/2022   11:13 AM 08/03/2022    4:30 PM 08/25/2022   11:49 AM 09/24/2022    1:55 PM  Fall  Risk  Falls in the past year?   1 1 1  0  Was there an injury with Fall?  1 1 0 0  Fall Risk Category Calculator  3 3 1  0  Fall Risk Category (Retired)  High     (RETIRED) Patient Fall Risk Level High fall risk High fall risk     Patient at Risk for Falls Due to  History of fall(s);Impaired balance/gait;Impaired mobility History of fall(s);Impaired balance/gait;Impaired mobility No Fall Risks   Fall risk Follow up  Falls evaluation completed Falls evaluation completed Falls evaluation completed    Functional Status Survey:    Vitals:   11/19/22 1258  BP: 118/80  Pulse: 66  Resp: 18  Temp: 97.9 F (36.6 C)  SpO2: 97%  Weight: 190 lb (86.2 kg)  Height: 5\' 6"  (1.676 m)   Body mass index is 30.67 kg/m. Physical Exam Vitals and nursing note reviewed.  Constitutional:      Appearance: Normal appearance.  HENT:     Head: Normocephalic and atraumatic.     Comments: Impacted cerumen removed with ear lavage and curette     Nose: Nose normal.     Mouth/Throat:     Mouth: Mucous membranes are moist.  Eyes:     Extraocular Movements: Extraocular movements intact.     Conjunctiva/sclera: Conjunctivae normal.     Pupils: Pupils are equal, round, and reactive to light.  Cardiovascular:     Rate and Rhythm: Normal rate. Rhythm irregular.     Heart sounds: No murmur heard. Pulmonary:     Effort: Pulmonary effort is normal.     Breath sounds: No rales.  Abdominal:     General: Bowel sounds are normal.     Palpations: Abdomen is soft.     Tenderness: There is no abdominal tenderness.  Musculoskeletal:     Cervical back: Normal range of motion and neck supple.     Right lower leg: Edema present.     Left lower leg: Edema present.     Comments: Minimal edema BLE, will think about TED  Skin:    General: Skin is warm and dry.  Neurological:     General: No focal deficit present.     Mental Status: He is alert and oriented to person, place, and time. Mental status is at baseline.     Motor: No weakness.     Gait:  Gait abnormal.  Psychiatric:        Mood and Affect: Mood normal.        Behavior: Behavior normal.        Thought Content: Thought content normal.     Labs reviewed: Recent Labs    07/03/22 0816 07/04/22 1101 07/05/22 0309 07/06/22 0406 08/04/22 0000  NA 134* 135 134* 135 138  K 4.4 4.1 4.2 3.8 4.8  CL 102 106 103 105 105  CO2 23 21* 21* 22 21  GLUCOSE 92 113* 90 88  --   BUN 13 15 15 15 21   CREATININE 1.20 1.14 1.19 1.11 1.1  CALCIUM 9.6 9.1 9.1 9.1 10.1  MG 1.9 1.9  --  1.9  --   PHOS 2.8  --   --  2.6  --    Recent Labs    07/04/22 1101 07/05/22 0309 07/06/22 0406 07/16/22 0000  AST 105* 87* 64* 26  ALT 38 38 38 27  ALKPHOS 55 57 58 75  BILITOT 1.4* 1.5* 1.5*  --   PROT 6.5 6.5 6.4*  --  ALBUMIN 3.3* 3.2* 3.1* 3.8   Recent Labs    07/01/22 2346 07/03/22 0816 07/04/22 1101 07/16/22 0000  WBC 12.8* 10.7* 8.9 8.6  NEUTROABS 11.2*  --   --  5,848.00  HGB 15.7 16.8 15.9 16.2  HCT 49.0 49.6 46.8 48  MCV 96.5 92.4 91.9  --   PLT 207 171 187 420*   Lab Results  Component Value Date   TSH 0.953 07/02/2022   Lab Results  Component Value Date   HGBA1C  08/24/2009    5.7 (NOTE) The ADA recommends the following therapeutic goal for glycemic control related to Hgb A1c measurement: Goal of therapy: <6.5 Hgb A1c  Reference: American Diabetes Association: Clinical Practice Recommendations 2010, Diabetes Care, 2010, 33: (Suppl  1).   Lab Results  Component Value Date   CHOL 111 09/02/2020   HDL 34 (L) 09/02/2020   LDLCALC 51 09/02/2020   LDLDIRECT 55 09/02/2020   TRIG 154 (H) 09/02/2020   CHOLHDL 3.3 09/02/2020    Significant Diagnostic Results in last 30 days:  No results found.  Assessment/Plan: Impacted cerumen of both ears Ear lavage, cerumen removed.   CKD (chronic kidney disease) stage 3, GFR 30-59 ml/min (HCC) Bun/creat 21/1.1 08/04/22  Polycythemia Hgb 16.3 07/14/22  Chronic CHF (HCC) EF 45-50% 11/2021, not on diuretics, trace edema  BLE  Bipolar disorder (HCC) followed by psychiatry, on Lithium, TSH 0.953 07/12/22, 07/16/22 Lithium level 0.6(0.6-1.2)  Atrial fibrillation (HCC) on Diltiazem, Eliquis  Cognitive impairment SNF [facility] for supportive care, 07/16/22 MMSE 18/30    Family/ staff Communication: plan of care reviewed with the patient and charge nurse.   Labs/tests ordered:  none  Time spend 35 minutes.

## 2022-11-19 NOTE — Assessment & Plan Note (Signed)
Hgb 16.3 07/14/22 

## 2022-11-19 NOTE — Assessment & Plan Note (Signed)
SNF [facility] for supportive care, 07/16/22 MMSE 18/30 

## 2022-11-19 NOTE — Assessment & Plan Note (Signed)
Bun/creat 21/1.1 08/04/22 

## 2022-11-25 ENCOUNTER — Encounter: Payer: Self-pay | Admitting: Nurse Practitioner

## 2022-11-25 ENCOUNTER — Non-Acute Institutional Stay (SKILLED_NURSING_FACILITY): Payer: Medicare Other | Admitting: Nurse Practitioner

## 2022-11-25 DIAGNOSIS — I4819 Other persistent atrial fibrillation: Secondary | ICD-10-CM

## 2022-11-25 DIAGNOSIS — F319 Bipolar disorder, unspecified: Secondary | ICD-10-CM | POA: Diagnosis not present

## 2022-11-25 DIAGNOSIS — M810 Age-related osteoporosis without current pathological fracture: Secondary | ICD-10-CM

## 2022-11-25 DIAGNOSIS — D751 Secondary polycythemia: Secondary | ICD-10-CM

## 2022-11-25 DIAGNOSIS — R4189 Other symptoms and signs involving cognitive functions and awareness: Secondary | ICD-10-CM

## 2022-11-25 DIAGNOSIS — E291 Testicular hypofunction: Secondary | ICD-10-CM

## 2022-11-25 DIAGNOSIS — I5042 Chronic combined systolic (congestive) and diastolic (congestive) heart failure: Secondary | ICD-10-CM | POA: Diagnosis not present

## 2022-11-25 DIAGNOSIS — R251 Tremor, unspecified: Secondary | ICD-10-CM

## 2022-11-25 NOTE — Assessment & Plan Note (Signed)
SNF [facility] for supportive care, 07/16/22 MMSE 18/30 

## 2022-11-25 NOTE — Assessment & Plan Note (Signed)
Hgb 16.3 07/14/22 

## 2022-11-25 NOTE — Progress Notes (Signed)
Location:   [facility] Room Number: 18 Place of Service:  SNF (31) Provider: Arna Snipe Jaylea Plourde NP  Moshe Cipro, NP  Patient Care Team: Moshe Cipro, NP as PCP - General (Family Medicine) Georgeanna Lea, MD as PCP - Cardiology (Cardiology)  Extended Emergency Contact Information Primary Emergency Contact: Eckhart,[name] Address: [phone]           [facility] [address]          [place], [place] [address] [name] of [place] Home Phone: [phone] Mobile Phone: [phone] Relation: Spouse Secondary Emergency Contact: [name] Address: [address]          [place], [place] [address] [place] Home Phone: [phone] Mobile Phone: [phone] Relation: Son  Code Status:  DNR Goals of care: Advanced Directive information    10/30/2022   10:59 AM  Advanced Directives  Does Patient Have a Medical Advance Directive? Yes  Type of Advance Directive Out of facility DNR (pink MOST or yellow form)  Does patient want to make changes to medical advance directive? No - Patient declined  Pre-existing out of facility DNR order (yellow form or pink MOST form) Yellow form placed in chart (order not valid for inpatient use)     Chief Complaint  Patient presents with   Medical Management of Chronic Issues    HPI:  Pt is a 89 y.o. male seen today for medical management of chronic diseases.      Parkinson's disease, stopped parkinson's medication because of affected his cognition, had resting tremor in left arm/leg in the past             OP taking Alendronate tscore -2.9 2017, -0.9 01/19/19, refused DEXA 11/02/22             OA takes Tylenol.              Cognitive impairment, SNF [facility] for supportive care, 07/16/22 MMSE 18/30             CVA, no focal weakness residual.              Afib, on Diltiazem, Eliquis             Bipolar disorder, followed by psychiatry, on Lithium, TSH 0.953 07/12/22, 07/16/22 Lithium level  0.6(0.6-1.2)             CHF, EF 45-50% 11/2021, not on diuretics, trace edema BLE             Gait abnormality, uses walker             Hyponatremia, Na 138 08/04/22             Hyperbilirubinemia, bilirubin 1.4 07/16/22            Polycythemia,  Hgb 16.3 07/14/22             CKD, Bun/creat 21/1.1 08/04/22             Hypogonadism, takes testosterone inj             Hyperlipidemia, takes Pravastatin             Glaucoma, eye drops.                  Past Medical History:  Diagnosis Date   AF (paroxysmal atrial fibrillation) (HCC) 02/16/2018   Atrial fibrillation (HCC)    Atrial fibrillation with RVR (HCC) 02/16/2018   Bipolar 1 disorder (HCC)    Bipolar  disorder (HCC) 02/17/2018   Dizziness 02/26/2020   DJD (degenerative joint disease)    Dyslipidemia 02/26/2020   Gait instability 02/16/2018   Hypertension    Mitral regurgitation    mild    Polycythemia 02/16/2018   Renal insufficiency    Stroke [facility])    Tremor 02/16/2018   Tremors of nervous system 01/2018   Past Surgical History:  Procedure Laterality Date   SHOULDER SURGERY      Allergies  Allergen Reactions   Lipitor [Atorvastatin] Other (See Comments)    Joint soreness    Allergies as of 11/25/2022       Reactions   Lipitor [atorvastatin] Other (See Comments)   Joint soreness        Medication List        Accurate as of Nov 25, 2022  4:24 PM. If you have any questions, ask your nurse or doctor.          acetaminophen 500 MG tablet Commonly known as: TYLENOL Take 500 mg by mouth 2 (two) times daily.   alendronate 70 MG tablet Commonly known as: FOSAMAX Take 70 mg by mouth once a week. Take with a full glass of water on an empty stomach.   diltiazem 120 MG 24 hr capsule Commonly known as: CARDIZEM CD TAKE ONE CAPSULE BY MOUTH DAILY   Eliquis 5 MG Tabs tablet Generic drug: apixaban TAKE 1 TABLET BY MOUTH TWICE DAILY.   latanoprost 0.005 % ophthalmic solution Commonly known as: XALATAN Place 1 drop  into both eyes at bedtime.   lithium carbonate 300 MG ER tablet Commonly known as: LITHOBID Take 1 tablet (300 mg total) by mouth every evening.   multivitamin with minerals Tabs tablet Take 1 tablet by mouth daily.   pravastatin 20 MG tablet Commonly known as: PRAVACHOL Take 1 tablet (20 mg total) by mouth every evening.   testosterone cypionate 200 MG/ML injection Commonly known as: DEPOTESTOSTERONE CYPIONATE Inject 1 mL (200 mg total) into the muscle every 21 ( twenty-one) days. Every 3 weeks   timolol 0.5 % ophthalmic solution Commonly known as: TIMOPTIC Place 1 drop into both eyes daily.   VITAMIN C PO Take 1 tablet by mouth daily.        Review of Systems  Constitutional:  Negative for appetite change, fatigue and fever.  HENT:  Positive for hearing loss. Negative for congestion and trouble swallowing.   Eyes:  Negative for visual disturbance.  Respiratory:  Negative for cough, shortness of breath and wheezing.   Cardiovascular:  Positive for leg swelling. Negative for chest pain and palpitations.  Gastrointestinal:  Negative for abdominal pain, constipation, nausea and vomiting.  Genitourinary:  Negative for dysuria and urgency.  Musculoskeletal:  Positive for arthralgias, gait problem and joint swelling.  Skin:  Negative for color change.  Neurological:  Negative for speech difficulty, weakness and headaches.  Psychiatric/Behavioral:  Positive for confusion. Negative for sleep disturbance. The patient is not nervous/anxious.     Immunization History  Administered Date(s) Administered   Influenza-Unspecified 04/15/2022   PPD Test 07/08/2022   Pneumococcal Conjugate-13 04/26/2014   Pneumococcal Polysaccharide-23 04/14/2005   Tdap 05/18/2016   Pertinent  Health Maintenance Due  Topic Date Due   INFLUENZA VACCINE  01/28/2023      07/08/2022    9:22 AM 07/10/2022   11:13 AM 08/03/2022    4:30 PM 08/25/2022   11:49 AM 09/24/2022    1:55 PM  Fall Risk  Falls  in the past year?  1 1 1  0  Was there an injury with Fall?  1 1 0 0  Fall Risk Category Calculator  3 3 1  0  Fall Risk Category (Retired)  High     (RETIRED) Patient Fall Risk Level High fall risk High fall risk     Patient at Risk for Falls Due to  History of fall(s);Impaired balance/gait;Impaired mobility History of fall(s);Impaired balance/gait;Impaired mobility No Fall Risks   Fall risk Follow up  Falls evaluation completed Falls evaluation completed Falls evaluation completed    Functional Status Survey:    Vitals:   11/25/22 1359  BP: (!) 106/49  Pulse: 64  Resp: 20  Temp: 97.6 F (36.4 C)  SpO2: 92%  Weight: 197 lb 11.2 oz (89.7 kg)   Body mass index is 31.91 kg/m. Physical Exam Vitals and nursing note reviewed.  Constitutional:      Appearance: Normal appearance.  HENT:     Head: Normocephalic and atraumatic.     Nose: Nose normal.     Mouth/Throat:     Mouth: Mucous membranes are moist.  Eyes:     Extraocular Movements: Extraocular movements intact.     Conjunctiva/sclera: Conjunctivae normal.     Pupils: Pupils are equal, round, and reactive to light.  Cardiovascular:     Rate and Rhythm: Normal rate. Rhythm irregular.     Heart sounds: No murmur heard. Pulmonary:     Effort: Pulmonary effort is normal.     Breath sounds: No rales.  Abdominal:     General: Bowel sounds are normal.     Palpations: Abdomen is soft.     Tenderness: There is no abdominal tenderness.  Musculoskeletal:     Cervical back: Normal range of motion and neck supple.     Right lower leg: Edema present.     Left lower leg: Edema present.     Comments: Minimal edema BLE, will think about TED  Skin:    General: Skin is warm and dry.  Neurological:     General: No focal deficit present.     Mental Status: He is alert and oriented to person, place, and time. Mental status is at baseline.     Motor: No weakness.     Gait: Gait abnormal.  Psychiatric:        Mood and Affect: Mood  normal.        Behavior: Behavior normal.        Thought Content: Thought content normal.     Labs reviewed: Recent Labs    07/03/22 0816 07/04/22 1101 07/05/22 0309 07/06/22 0406 08/04/22 0000  NA 134* 135 134* 135 138  K 4.4 4.1 4.2 3.8 4.8  CL 102 106 103 105 105  CO2 23 21* 21* 22 21  GLUCOSE 92 113* 90 88  --   BUN 13 15 15 15 21   CREATININE 1.20 1.14 1.19 1.11 1.1  CALCIUM 9.6 9.1 9.1 9.1 10.1  MG 1.9 1.9  --  1.9  --   PHOS 2.8  --   --  2.6  --    Recent Labs    07/04/22 1101 07/05/22 0309 07/06/22 0406 07/16/22 0000  AST 105* 87* 64* 26  ALT 38 38 38 27  ALKPHOS 55 57 58 75  BILITOT 1.4* 1.5* 1.5*  --   PROT 6.5 6.5 6.4*  --   ALBUMIN 3.3* 3.2* 3.1* 3.8   Recent Labs    07/01/22 2346 07/03/22 0816 07/04/22 1101 07/16/22 0000  WBC  12.8* 10.7* 8.9 8.6  NEUTROABS 11.2*  --   --  5,848.00  HGB 15.7 16.8 15.9 16.2  HCT 49.0 49.6 46.8 48  MCV 96.5 92.4 91.9  --   PLT 207 171 187 420*   Lab Results  Component Value Date   TSH 0.953 07/02/2022   Lab Results  Component Value Date   HGBA1C  08/24/2009    5.7 (NOTE) The ADA recommends the following therapeutic goal for glycemic control related to Hgb A1c measurement: Goal of therapy: <6.5 Hgb A1c  Reference: American Diabetes Association: Clinical Practice Recommendations 2010, Diabetes Care, 2010, 33: (Suppl  1).   Lab Results  Component Value Date   CHOL 111 09/02/2020   HDL 34 (L) 09/02/2020   LDLCALC 51 09/02/2020   LDLDIRECT 55 09/02/2020   TRIG 154 (H) 09/02/2020   CHOLHDL 3.3 09/02/2020    Significant Diagnostic Results in last 30 days:  No results found.  Assessment/Plan  Atrial fibrillation, persistent (HCC)  on Diltiazem, Eliquis, heart rate is in control, noted some low Bp measurements, will hold Diltiazem if Bp<90/60  Bipolar disorder (HCC)  followed by psychiatry, on Lithium, TSH 0.953 07/12/22, 07/16/22 Lithium level 0.6(0.6-1.2)  Chronic CHF (HCC) EF 45-50% 11/2021, not on  diuretics, trace edema BLE  Polycythemia Hgb 16.3 07/14/22  Hypogonadism in male takes testosterone inj  Cognitive impairment SNF [facility] for supportive care, 07/16/22 MMSE 18/30  Osteoporosis  taking Alendronate tscore -2.9 2017, -0.9 01/19/19, refused DEXA 11/02/22  Tremors of nervous system Parkinson's disease, stopped parkinson's medication because of affected his cognition, had resting tremor in left arm/leg in the past   Family/ staff Communication: plan of care reviewed with the patient and charge nurse.   Labs/tests ordered:  none  Time spend 35 minutes.

## 2022-11-25 NOTE — Assessment & Plan Note (Signed)
followed by psychiatry, on Lithium, TSH 0.953 07/12/22, 07/16/22 Lithium level 0.6(0.6-1.2) 

## 2022-11-25 NOTE — Assessment & Plan Note (Signed)
EF 45-50% 11/2021, not on diuretics, trace edema BLE 

## 2022-11-25 NOTE — Assessment & Plan Note (Signed)
takes testosterone inj 

## 2022-11-25 NOTE — Assessment & Plan Note (Signed)
on Diltiazem, Eliquis, heart rate is in control, noted some low Bp measurements, will hold Diltiazem if Bp<90/60

## 2022-11-25 NOTE — Assessment & Plan Note (Signed)
taking Alendronate tscore -2.9 2017, -0.9 01/19/19, refused DEXA 11/02/22

## 2022-11-25 NOTE — Assessment & Plan Note (Signed)
Parkinson's disease, stopped parkinson's medication because of affected his cognition, had resting tremor in left arm/leg in the past 

## 2022-12-01 ENCOUNTER — Encounter: Payer: Self-pay | Admitting: Nurse Practitioner

## 2022-12-01 ENCOUNTER — Non-Acute Institutional Stay (SKILLED_NURSING_FACILITY): Payer: Medicare Other | Admitting: Nurse Practitioner

## 2022-12-01 DIAGNOSIS — B351 Tinea unguium: Secondary | ICD-10-CM | POA: Diagnosis not present

## 2022-12-01 DIAGNOSIS — F319 Bipolar disorder, unspecified: Secondary | ICD-10-CM

## 2022-12-01 DIAGNOSIS — E785 Hyperlipidemia, unspecified: Secondary | ICD-10-CM

## 2022-12-01 DIAGNOSIS — M79674 Pain in right toe(s): Secondary | ICD-10-CM

## 2022-12-01 DIAGNOSIS — I5042 Chronic combined systolic (congestive) and diastolic (congestive) heart failure: Secondary | ICD-10-CM

## 2022-12-01 DIAGNOSIS — M810 Age-related osteoporosis without current pathological fracture: Secondary | ICD-10-CM | POA: Diagnosis not present

## 2022-12-01 NOTE — Assessment & Plan Note (Signed)
taking Alendronate tscore -2.9 2017, -0.9 01/19/19, refused DEXA 11/02/22, may consider drug holiday if >3-5 years.

## 2022-12-01 NOTE — Assessment & Plan Note (Addendum)
feeling ear vibrating at night interrupting his night sleep, otoscope exam: no redness, TM visible, no drainage, no pain during examination, may consider ENT if the patient desires  followed by psychiatry, on Lithium, TSH 0.953 07/12/22, 07/16/22 Lithium level 0.6(0.6-1.2)

## 2022-12-01 NOTE — Assessment & Plan Note (Signed)
EF 45-50% 11/2021, not on diuretics, trace edema BLE 

## 2022-12-01 NOTE — Assessment & Plan Note (Signed)
takes Pravastatin, update lipid panel.   

## 2022-12-01 NOTE — Assessment & Plan Note (Signed)
c/o the right 5th pain, no trauma/injury, no redness or warmth of the toe, will refer to podiatrist 12/08/22

## 2022-12-01 NOTE — Progress Notes (Unsigned)
Location:  [facility] Room Number: NO/18/A Place of Service:  SNF (31) Provider:  Yuvia Plant X, NP   Patient Care Team: Moshe Cipro, NP as PCP - General (Family Medicine) Georgeanna Lea, MD as PCP - Cardiology (Cardiology)  Extended Emergency Contact Information Primary Emergency Contact: Northrop,[name] Address: [phone].           [facility] [address]          [place], [place] [address] [name] of [place] Home Phone: [phone] Mobile Phone: [phone] Relation: Spouse Secondary Emergency Contact: [name] Address: [address]          [place], [place] [address] [place] Home Phone: [phone] Mobile Phone: [phone] Relation: Son  Code Status:  DNR Goals of care: Advanced Directive information    10/30/2022   10:59 AM  Advanced Directives  Does Patient Have a Medical Advance Directive? Yes  Type of Advance Directive Out of facility DNR (pink MOST or yellow form)  Does patient want to make changes to medical advance directive? No - Patient declined  Pre-existing out of facility DNR order (yellow form or pink MOST form) Yellow form placed in chart (order not valid for inpatient use)     Chief Complaint  Patient presents with   Acute Visit    Patient is being seen for right 5th toe pain Elevated BP need 2nd reading for care gap metric     HPI:  Pt is a 89 y.o. male seen today for an acute visit for c/o the right 5th pain, feeling ear vibrating at night interrupting his night sleep.   Parkinson's disease, stopped parkinson's medication because of affected his cognition, had resting tremor in left arm/leg in the past             OP taking Alendronate tscore -2.9 2017, -0.9 01/19/19, refused DEXA 11/02/22             OA takes Tylenol.              Cognitive impairment, SNF [facility] for supportive care, 07/16/22 MMSE 18/30             CVA, no focal weakness residual.              Afib, on  Diltiazem, Eliquis             Bipolar disorder, followed by psychiatry, on Lithium, TSH 0.953 07/12/22, 07/16/22 Lithium level 0.6(0.6-1.2)             CHF, EF 45-50% 11/2021, not on diuretics, trace edema BLE             Gait abnormality, uses w/c for mobility mostly.              Hyponatremia, Na 138 08/04/22             Hyperbilirubinemia, bilirubin 1.4 07/16/22            Polycythemia,  Hgb 16.3 07/14/22             CKD, Bun/creat 21/1.1 08/04/22             Hypogonadism, takes testosterone inj             Hyperlipidemia, takes Pravastatin             Glaucoma, eye drops.                       Past  Medical History:  Diagnosis Date   AF (paroxysmal atrial fibrillation) (HCC) 02/16/2018   Atrial fibrillation (HCC)    Atrial fibrillation with RVR (HCC) 02/16/2018   Bipolar 1 disorder (HCC)    Bipolar disorder (HCC) 02/17/2018   Dizziness 02/26/2020   DJD (degenerative joint disease)    Dyslipidemia 02/26/2020   Gait instability 02/16/2018   Hypertension    Mitral regurgitation    mild    Polycythemia 02/16/2018   Renal insufficiency    Stroke [facility])    Tremor 02/16/2018   Tremors of nervous system 01/2018   Past Surgical History:  Procedure Laterality Date   SHOULDER SURGERY      Allergies  Allergen Reactions   Lipitor [Atorvastatin] Other (See Comments)    Joint soreness    Outpatient Encounter Medications as of 12/01/2022  Medication Sig   acetaminophen (TYLENOL) 500 MG tablet Take 500 mg by mouth 2 (two) times daily.    alendronate (FOSAMAX) 70 MG tablet Take 70 mg by mouth once a week. Take with a full glass of water on an empty stomach.   Ascorbic Acid (VITAMIN C PO) Take 1 tablet by mouth daily.   diltiazem (CARDIZEM CD) 120 MG 24 hr capsule TAKE ONE CAPSULE BY MOUTH DAILY   ELIQUIS 5 MG TABS tablet TAKE 1 TABLET BY MOUTH TWICE DAILY.   latanoprost (XALATAN) 0.005 % ophthalmic solution Place 1 drop into both eyes at bedtime.   lithium carbonate (LITHOBID) 300 MG ER tablet  Take 1 tablet (300 mg total) by mouth every evening.   Multiple Vitamin (MULTIVITAMIN WITH MINERALS) TABS tablet Take 1 tablet by mouth daily.   pravastatin (PRAVACHOL) 20 MG tablet Take 1 tablet (20 mg total) by mouth every evening.   testosterone cypionate (DEPOTESTOSTERONE CYPIONATE) 200 MG/ML injection Inject 1 mL (200 mg total) into the muscle every 21 ( twenty-one) days. Every 3 weeks   timolol (TIMOPTIC) 0.5 % ophthalmic solution Place 1 drop into both eyes daily.   No facility-administered encounter medications on file as of 12/01/2022.    Review of Systems  Constitutional:  Negative for appetite change, fatigue and fever.  HENT:  Positive for hearing loss. Negative for congestion, ear discharge, ear pain and trouble swallowing.        C/o ear vibrating at night   Eyes:  Negative for visual disturbance.  Respiratory:  Negative for cough, shortness of breath and wheezing.   Cardiovascular:  Positive for leg swelling. Negative for chest pain and palpitations.  Gastrointestinal:  Negative for abdominal pain and constipation.  Genitourinary:  Negative for dysuria and urgency.  Musculoskeletal:  Positive for arthralgias, gait problem and joint swelling.       The right 5th toe pain.   Skin:  Negative for color change.  Neurological:  Negative for speech difficulty, weakness and headaches.  Psychiatric/Behavioral:  Positive for confusion. Negative for sleep disturbance. The patient is not nervous/anxious.     Immunization History  Administered Date(s) Administered   Influenza-Unspecified 04/15/2022   PPD Test 07/08/2022   Pneumococcal Conjugate-13 04/26/2014   Pneumococcal Polysaccharide-23 04/14/2005   Tdap 05/18/2016   Pertinent  Health Maintenance Due  Topic Date Due   INFLUENZA VACCINE  01/28/2023      07/08/2022    9:22 AM 07/10/2022   11:13 AM 08/03/2022    4:30 PM 08/25/2022   11:49 AM 09/24/2022    1:55 PM  Fall Risk  Falls in the past year?  1 1 1  0  Was there  an  injury with Fall?  1 1 0 0  Fall Risk Category Calculator  3 3 1  0  Fall Risk Category (Retired)  High     (RETIRED) Patient Fall Risk Level High fall risk High fall risk     Patient at Risk for Falls Due to  History of fall(s);Impaired balance/gait;Impaired mobility History of fall(s);Impaired balance/gait;Impaired mobility No Fall Risks   Fall risk Follow up  Falls evaluation completed Falls evaluation completed Falls evaluation completed    Functional Status Survey:    Vitals:   12/01/22 1350  BP: (!) 150/60  Pulse: 62  Resp: 20  Temp: (!) 96.6 F (35.9 C)  SpO2: 96%  Weight: 187 lb 8 oz (85 kg)  Height: 5\' 6"  (1.676 m)   Body mass index is 30.26 kg/m. Physical Exam Vitals and nursing note reviewed.  Constitutional:      Appearance: Normal appearance.  HENT:     Head: Normocephalic and atraumatic.     Right Ear: Tympanic membrane, ear canal and external ear normal. There is no impacted cerumen.     Left Ear: Tympanic membrane, ear canal and external ear normal. There is no impacted cerumen.     Nose: Nose normal.     Mouth/Throat:     Mouth: Mucous membranes are moist.  Eyes:     Extraocular Movements: Extraocular movements intact.     Conjunctiva/sclera: Conjunctivae normal.     Pupils: Pupils are equal, round, and reactive to light.  Cardiovascular:     Rate and Rhythm: Normal rate. Rhythm irregular.     Heart sounds: No murmur heard. Pulmonary:     Effort: Pulmonary effort is normal.     Breath sounds: No rales.  Abdominal:     General: Bowel sounds are normal.     Palpations: Abdomen is soft.     Tenderness: There is no abdominal tenderness.  Musculoskeletal:     Cervical back: Normal range of motion and neck supple.     Right lower leg: Edema present.     Left lower leg: Edema present.     Comments: Minimal edema BLE, will think about TED  Skin:    General: Skin is warm and dry.     Comments: The right 5th toe nail yellow thick, no trauma/injury, no  redness or warmth of the toe.   Neurological:     General: No focal deficit present.     Mental Status: He is alert and oriented to person, place, and time. Mental status is at baseline.     Motor: No weakness.     Gait: Gait abnormal.  Psychiatric:        Mood and Affect: Mood normal.        Behavior: Behavior normal.        Thought Content: Thought content normal.     Labs reviewed: Recent Labs    07/03/22 0816 07/04/22 1101 07/05/22 0309 07/06/22 0406 08/04/22 0000  NA 134* 135 134* 135 138  K 4.4 4.1 4.2 3.8 4.8  CL 102 106 103 105 105  CO2 23 21* 21* 22 21  GLUCOSE 92 113* 90 88  --   BUN 13 15 15 15 21   CREATININE 1.20 1.14 1.19 1.11 1.1  CALCIUM 9.6 9.1 9.1 9.1 10.1  MG 1.9 1.9  --  1.9  --   PHOS 2.8  --   --  2.6  --    Recent Labs    07/04/22 1101 07/05/22 0309  07/06/22 0406 07/16/22 0000  AST 105* 87* 64* 26  ALT 38 38 38 27  ALKPHOS 55 57 58 75  BILITOT 1.4* 1.5* 1.5*  --   PROT 6.5 6.5 6.4*  --   ALBUMIN 3.3* 3.2* 3.1* 3.8   Recent Labs    07/01/22 2346 07/03/22 0816 07/04/22 1101 07/16/22 0000  WBC 12.8* 10.7* 8.9 8.6  NEUTROABS 11.2*  --   --  5,848.00  HGB 15.7 16.8 15.9 16.2  HCT 49.0 49.6 46.8 48  MCV 96.5 92.4 91.9  --   PLT 207 171 187 420*   Lab Results  Component Value Date   TSH 0.953 07/02/2022   Lab Results  Component Value Date   HGBA1C  08/24/2009    5.7 (NOTE) The ADA recommends the following therapeutic goal for glycemic control related to Hgb A1c measurement: Goal of therapy: <6.5 Hgb A1c  Reference: American Diabetes Association: Clinical Practice Recommendations 2010, Diabetes Care, 2010, 33: (Suppl  1).   Lab Results  Component Value Date   CHOL 111 09/02/2020   HDL 34 (L) 09/02/2020   LDLCALC 51 09/02/2020   LDLDIRECT 55 09/02/2020   TRIG 154 (H) 09/02/2020   CHOLHDL 3.3 09/02/2020    Significant Diagnostic Results in last 30 days:  No results found.  Assessment/Plan Pain due to onychomycosis of  toenail of right foot c/o the right 5th pain, no trauma/injury, no redness or warmth of the toe, will refer to podiatrist 12/08/22  Bipolar disorder (HCC)  feeling ear vibrating at night interrupting his night sleep, otoscope exam: no redness, TM visible, no drainage, no pain during examination, may consider ENT if the patient desires  followed by psychiatry, on Lithium, TSH 0.953 07/12/22, 07/16/22 Lithium level 0.6(0.6-1.2)  Chronic CHF (HCC) EF 45-50% 11/2021, not on diuretics, trace edema BLE  Osteoporosis taking Alendronate tscore -2.9 2017, -0.9 01/19/19, refused DEXA 11/02/22, may consider drug holiday if >3-5 years.   Dyslipidemia takes Pravastatin, update lipid panel     Family/ staff Communication: plan of care reviewed with the patient and charge nurse.   Labs/tests ordered: lipid panel.   Time spend 35 minutes.

## 2022-12-03 ENCOUNTER — Other Ambulatory Visit: Payer: Self-pay | Admitting: Nurse Practitioner

## 2022-12-03 DIAGNOSIS — I10 Essential (primary) hypertension: Secondary | ICD-10-CM | POA: Diagnosis not present

## 2022-12-03 DIAGNOSIS — E785 Hyperlipidemia, unspecified: Secondary | ICD-10-CM | POA: Diagnosis not present

## 2022-12-03 LAB — LIPID PANEL
Cholesterol: 116 (ref 0–200)
HDL: 46 (ref 35–70)
LDL Cholesterol: 54
Triglycerides: 80 (ref 40–160)

## 2022-12-08 ENCOUNTER — Ambulatory Visit (HOSPITAL_COMMUNITY): Payer: Medicare Other | Attending: Physician Assistant

## 2022-12-08 DIAGNOSIS — I340 Nonrheumatic mitral (valve) insufficiency: Secondary | ICD-10-CM | POA: Diagnosis not present

## 2022-12-08 LAB — ECHOCARDIOGRAM COMPLETE
Area-P 1/2: 5.4 cm2
P 1/2 time: 292 msec
S' Lateral: 2.5 cm

## 2022-12-08 MED ORDER — PERFLUTREN LIPID MICROSPHERE
1.0000 mL | INTRAVENOUS | Status: AC | PRN
  Administered 2022-12-08: 2 mL via INTRAVENOUS

## 2022-12-22 ENCOUNTER — Telehealth: Payer: Self-pay | Admitting: Psychiatry

## 2022-12-22 DIAGNOSIS — I447 Left bundle-branch block, unspecified: Secondary | ICD-10-CM | POA: Diagnosis not present

## 2022-12-22 DIAGNOSIS — I4891 Unspecified atrial fibrillation: Secondary | ICD-10-CM | POA: Diagnosis not present

## 2022-12-22 NOTE — Telephone Encounter (Signed)
Next visit is 02/17/23. [name] called re: Rosanne Ashing. He is having trouble with sleep, depression and anxiety. They are living in different locations. Pharmacy is:  [facility] - [place], [place] - [address]   Phone:[phone]:[phone]

## 2022-12-24 ENCOUNTER — Ambulatory Visit: Payer: Medicare Other | Admitting: Psychiatry

## 2022-12-24 DIAGNOSIS — I5042 Chronic combined systolic (congestive) and diastolic (congestive) heart failure: Secondary | ICD-10-CM | POA: Diagnosis not present

## 2022-12-24 DIAGNOSIS — I10 Essential (primary) hypertension: Secondary | ICD-10-CM | POA: Diagnosis not present

## 2022-12-24 DIAGNOSIS — E785 Hyperlipidemia, unspecified: Secondary | ICD-10-CM | POA: Diagnosis not present

## 2022-12-24 DIAGNOSIS — F319 Bipolar disorder, unspecified: Secondary | ICD-10-CM | POA: Diagnosis not present

## 2022-12-24 DIAGNOSIS — N1831 Chronic kidney disease, stage 3a: Secondary | ICD-10-CM | POA: Diagnosis not present

## 2022-12-24 LAB — CBC AND DIFFERENTIAL
HCT: 45 (ref 41–53)
Hemoglobin: 15 (ref 13.5–17.5)
Neutrophils Absolute: 5303
Platelets: 238 10*3/uL (ref 150–400)
WBC: 7.5

## 2022-12-24 LAB — HEPATIC FUNCTION PANEL
ALT: 16 U/L (ref 10–40)
AST: 21 (ref 14–40)
Alkaline Phosphatase: 80 (ref 25–125)
Bilirubin, Total: 0.9

## 2022-12-24 LAB — BASIC METABOLIC PANEL
BUN: 23 — AB (ref 4–21)
CO2: 22 (ref 13–22)
Chloride: 107 (ref 99–108)
Creatinine: 1.1 (ref 0.6–1.3)
Glucose: 95
Potassium: 4.4 meq/L (ref 3.5–5.1)
Sodium: 139 (ref 137–147)

## 2022-12-24 LAB — COMPREHENSIVE METABOLIC PANEL
Albumin: 4.2 (ref 3.5–5.0)
Calcium: 10.1 (ref 8.7–10.7)
Globulin: 2.9
eGFR: 62

## 2022-12-24 LAB — CBC: RBC: 4.87 (ref 3.87–5.11)

## 2022-12-24 LAB — TSH: TSH: 1.59 (ref 0.41–5.90)

## 2022-12-24 NOTE — Telephone Encounter (Signed)
Please call to schedule an earlier appt with Dr. Jennelle Human if available.  Please put on cancellation list also.

## 2023-01-07 DIAGNOSIS — F319 Bipolar disorder, unspecified: Secondary | ICD-10-CM | POA: Diagnosis not present

## 2023-01-07 DIAGNOSIS — U071 COVID-19: Secondary | ICD-10-CM | POA: Diagnosis not present

## 2023-01-09 DIAGNOSIS — F319 Bipolar disorder, unspecified: Secondary | ICD-10-CM | POA: Diagnosis not present

## 2023-01-09 DIAGNOSIS — U071 COVID-19: Secondary | ICD-10-CM | POA: Diagnosis not present

## 2023-01-12 ENCOUNTER — Encounter: Payer: Self-pay | Admitting: Nurse Practitioner

## 2023-01-12 DIAGNOSIS — B351 Tinea unguium: Secondary | ICD-10-CM | POA: Diagnosis not present

## 2023-01-12 DIAGNOSIS — M79671 Pain in right foot: Secondary | ICD-10-CM | POA: Diagnosis not present

## 2023-01-12 DIAGNOSIS — M79672 Pain in left foot: Secondary | ICD-10-CM | POA: Diagnosis not present

## 2023-01-12 NOTE — Progress Notes (Signed)
 This encounter was created in error - please disregard.

## 2023-01-13 DIAGNOSIS — F319 Bipolar disorder, unspecified: Secondary | ICD-10-CM | POA: Diagnosis not present

## 2023-01-13 DIAGNOSIS — U071 COVID-19: Secondary | ICD-10-CM | POA: Diagnosis not present

## 2023-01-14 DIAGNOSIS — F319 Bipolar disorder, unspecified: Secondary | ICD-10-CM | POA: Diagnosis not present

## 2023-01-14 DIAGNOSIS — U071 COVID-19: Secondary | ICD-10-CM | POA: Diagnosis not present

## 2023-01-18 DIAGNOSIS — U071 COVID-19: Secondary | ICD-10-CM | POA: Diagnosis not present

## 2023-01-18 DIAGNOSIS — F319 Bipolar disorder, unspecified: Secondary | ICD-10-CM | POA: Diagnosis not present

## 2023-01-19 ENCOUNTER — Non-Acute Institutional Stay (SKILLED_NURSING_FACILITY): Payer: Medicare Other | Admitting: Family Medicine

## 2023-01-19 DIAGNOSIS — R2681 Unsteadiness on feet: Secondary | ICD-10-CM

## 2023-01-19 DIAGNOSIS — I480 Paroxysmal atrial fibrillation: Secondary | ICD-10-CM | POA: Diagnosis not present

## 2023-01-19 DIAGNOSIS — R4189 Other symptoms and signs involving cognitive functions and awareness: Secondary | ICD-10-CM

## 2023-01-19 DIAGNOSIS — G9341 Metabolic encephalopathy: Secondary | ICD-10-CM

## 2023-01-19 DIAGNOSIS — F319 Bipolar disorder, unspecified: Secondary | ICD-10-CM

## 2023-01-19 NOTE — Progress Notes (Signed)
Provider:  Jacalyn Lefevre, MD Location:      Place of Service:     PCP: Moshe Cipro, NP Patient Care Team: Moshe Cipro, NP as PCP - General (Family Medicine) Georgeanna Lea, MD as PCP - Cardiology (Cardiology)  Extended Emergency Contact Information Primary Emergency Contact: Struthers,[name] Address: [phone]           [facility] [address]          [place], [place] [address] [name] Home Phone: [phone] Mobile Phone: [phone] Relation: Spouse Secondary Emergency Contact: [name] Address: [address]          [place], [place] [address] [place] Home Phone: [phone] Mobile Phone: [phone] Relation: Son  Code Status:  Goals of Care: Advanced Directive information    10/30/2022   10:59 AM  Advanced Directives  Does Patient Have a Medical Advance Directive? Yes  Type of Advance Directive Out of facility DNR (pink MOST or yellow form)  Does patient want to make changes to medical advance directive? No - Patient declined  Pre-existing out of facility DNR order (yellow form or pink MOST form) Yellow form placed in chart (order not valid for inpatient use)      No chief complaint on file.   HPI: Patient is a 89 y.o. male seen today for medical management of chronic problems including: Atrial fibrillation, bipolar disorder gait instability, hypertension, Parkinson's disease and Parkinson's disease . He has been a resident here since January after hospitalization for metabolic encephalopathy.  He has a long history of bipolar disorder and is followed by psychiatry and has been on lithium long-term.  Also has a history of Parkinson's disease but medication was stopped as it was felt it affected his cognition which has been a problem. There is also a history of A-fib on diltiazem and Eliquis. He seems to express frequent unhappiness at living here and his wife living in friend's home when asked.   She does visit on a regular basis but when I saw him today he was waiting a heart of come; nurse states she has already been here earlier today  Past Medical History:  Diagnosis Date   AF (paroxysmal atrial fibrillation) (HCC) 02/16/2018   Atrial fibrillation (HCC)    Atrial fibrillation with RVR (HCC) 02/16/2018   Bipolar 1 disorder (HCC)    Bipolar disorder (HCC) 02/17/2018   Dizziness 02/26/2020   DJD (degenerative joint disease)    Dyslipidemia 02/26/2020   Gait instability 02/16/2018   Hypertension    Mitral regurgitation    mild    Polycythemia 02/16/2018   Renal insufficiency    Stroke [facility])    Tremor 02/16/2018   Tremors of nervous system 01/2018   Past Surgical History:  Procedure Laterality Date   SHOULDER SURGERY      reports that he has quit smoking. He has never used smokeless tobacco. He reports current alcohol use of about 1.0 standard drink of alcohol per week. He reports that he does not use drugs. Social History   Socioeconomic History   Marital status: Married    Spouse name: Not on file   Number of children: Not on file   Years of education: Not on file   Highest education level: Not on file  Occupational History   Not on file  Tobacco Use   Smoking status: Former   Smokeless tobacco: Never  Vaping Use   Vaping status: Never Used  Substance and Sexual Activity  Alcohol use: Yes    Alcohol/week: 1.0 standard drink of alcohol    Types: 1 Glasses of wine per week    Comment: 1 per month   Drug use: No   Sexual activity: Not on file  Other Topics Concern   Not on file  Social History Narrative   Not on file   Social Determinants of Health   Financial Resource Strain: Not on file  Food Insecurity: No Food Insecurity (07/02/2022)   Hunger Vital Sign    Worried About Running Out of Food in the Last Year: Never true    Ran Out of Food in the Last Year: Never true  Transportation Needs: No Transportation Needs (07/02/2022)   PRAPARE - Therapist, art (Medical): No    Lack of Transportation (Non-Medical): No  Physical Activity: Not on file  Stress: Not on file  Social Connections: Not on file  Intimate Partner Violence: Not At Risk (07/02/2022)   Humiliation, Afraid, Rape, and Kick questionnaire    Fear of Current or Ex-Partner: No    Emotionally Abused: No    Physically Abused: No    Sexually Abused: No    Functional Status Survey:    Family History  Problem Relation Age of Onset   Alcohol abuse Mother    Cancer Father     Health Maintenance  Topic Date Due   Zoster Vaccines- Shingrix (1 of 2) Never done   COVID-19 Vaccine (1) 09/28/2094 (Originally 04/25/1938)   INFLUENZA VACCINE  01/28/2023   Medicare Annual Wellness (AWV)  07/14/2023   DTaP/Tdap/Td (2 - Td or Tdap) 05/18/2026   Pneumonia Vaccine 44+ Years old  Completed   HPV VACCINES  Aged Out    Allergies  Allergen Reactions   Lipitor [Atorvastatin] Other (See Comments)    Joint soreness    Outpatient Encounter Medications as of 01/19/2023  Medication Sig   acetaminophen (TYLENOL) 500 MG tablet Take 500 mg by mouth 2 (two) times daily.    Ascorbic Acid (VITAMIN C PO) Take 1 tablet by mouth daily.   diltiazem (CARDIZEM CD) 120 MG 24 hr capsule TAKE ONE CAPSULE BY MOUTH DAILY   ELIQUIS 5 MG TABS tablet TAKE 1 TABLET BY MOUTH TWICE DAILY.   latanoprost (XALATAN) 0.005 % ophthalmic solution Place 1 drop into both eyes at bedtime.   lithium carbonate (LITHOBID) 300 MG ER tablet Take 1 tablet (300 mg total) by mouth every evening.   Multiple Vitamin (MULTIVITAMIN WITH MINERALS) TABS tablet Take 1 tablet by mouth daily.   pravastatin (PRAVACHOL) 20 MG tablet Take 1 tablet (20 mg total) by mouth every evening.   testosterone cypionate (DEPOTESTOSTERONE CYPIONATE) 200 MG/ML injection Inject 1 mL (200 mg total) into the muscle every 21 ( twenty-one) days. Every 3 weeks   timolol (TIMOPTIC) 0.5 % ophthalmic solution Place 1 drop into both eyes  daily.   No facility-administered encounter medications on file as of 01/19/2023.    Review of Systems  Constitutional:  Positive for activity change.  Respiratory: Negative.    Cardiovascular: Negative.   Gastrointestinal: Negative.   Genitourinary: Negative.   Musculoskeletal:  Positive for gait problem.  Neurological:  Positive for tremors.  Psychiatric/Behavioral:  Positive for confusion.   All other systems reviewed and are negative.   There were no vitals filed for this visit. There is no height or weight on file to calculate BMI. Physical Exam Vitals and nursing note reviewed.  Constitutional:  Appearance: Normal appearance.  Eyes:     Extraocular Movements: Extraocular movements intact.     Pupils: Pupils are equal, round, and reactive to light.  Cardiovascular:     Rate and Rhythm: Normal rate and regular rhythm.  Pulmonary:     Effort: Pulmonary effort is normal.     Breath sounds: Normal breath sounds.  Abdominal:     General: Bowel sounds are normal.     Palpations: Abdomen is soft.  Musculoskeletal:     Comments: Ambulates in wheelchair  Neurological:     General: No focal deficit present.     Mental Status: He is alert.     Comments: Oriented to person     Labs reviewed: Basic Metabolic Panel: Recent Labs    07/03/22 0816 07/04/22 1101 07/05/22 0309 07/06/22 0406 08/04/22 0000  NA 134* 135 134* 135 138  K 4.4 4.1 4.2 3.8 4.8  CL 102 106 103 105 105  CO2 23 21* 21* 22 21  GLUCOSE 92 113* 90 88  --   BUN 13 15 15 15 21   CREATININE 1.20 1.14 1.19 1.11 1.1  CALCIUM 9.6 9.1 9.1 9.1 10.1  MG 1.9 1.9  --  1.9  --   PHOS 2.8  --   --  2.6  --    Liver Function Tests: Recent Labs    07/04/22 1101 07/05/22 0309 07/06/22 0406 07/16/22 0000  AST 105* 87* 64* 26  ALT 38 38 38 27  ALKPHOS 55 57 58 75  BILITOT 1.4* 1.5* 1.5*  --   PROT 6.5 6.5 6.4*  --   ALBUMIN 3.3* 3.2* 3.1* 3.8   No results for input(s): "LIPASE", "AMYLASE" in the last  8760 hours. Recent Labs    07/02/22 1132  AMMONIA 22   CBC: Recent Labs    07/01/22 2346 07/03/22 0816 07/04/22 1101 07/16/22 0000  WBC 12.8* 10.7* 8.9 8.6  NEUTROABS 11.2*  --   --  5,848.00  HGB 15.7 16.8 15.9 16.2  HCT 49.0 49.6 46.8 48  MCV 96.5 92.4 91.9  --   PLT 207 171 187 420*   Cardiac Enzymes: Recent Labs    07/03/22 0816 07/04/22 1101 07/05/22 0309  CKTOTAL 3,117* 653* 279   BNP: Invalid input(s): "POCBNP" Lab Results  Component Value Date   HGBA1C  08/24/2009    5.7 (NOTE) The ADA recommends the following therapeutic goal for glycemic control related to Hgb A1c measurement: Goal of therapy: <6.5 Hgb A1c  Reference: American Diabetes Association: Clinical Practice Recommendations 2010, Diabetes Care, 2010, 33: (Suppl  1).   Lab Results  Component Value Date   TSH 0.953 07/02/2022   Lab Results  Component Value Date   VITAMINB12 278 07/02/2022   No results found for: "FOLATE" No results found for: "IRON", "TIBC", "FERRITIN"  Imaging and Procedures obtained prior to SNF admission: CT Head Wo Contrast  Result Date: 07/01/2022 CLINICAL DATA:  Head and neck trauma. EXAM: CT HEAD WITHOUT CONTRAST CT CERVICAL SPINE WITHOUT CONTRAST TECHNIQUE: Multidetector CT imaging of the head and cervical spine was performed following the standard protocol without intravenous contrast. Multiplanar CT image reconstructions of the cervical spine were also generated. RADIATION DOSE REDUCTION: This exam was performed according to the departmental dose-optimization program which includes automated exposure control, adjustment of the mA and/or kV according to patient size and/or use of iterative reconstruction technique. COMPARISON:  CT examination dated February 16, 2018 FINDINGS: CT HEAD FINDINGS Brain: No evidence of acute infarction, hemorrhage,  extra-axial collection or mass lesion/mass effect. Prominence of the ventricles and sulci secondary to moderate cerebral volume loss.  Diffuse low-attenuation of the periventricular white matter presumed advanced chronic microvascular ischemic changes of the white matter. Prominence of the ventricles disproportionate to the volume loss concerning for normal pressure hydrocephalus. Vascular: No hyperdense vessel or unexpected calcification. Skull: Normal. Negative for fracture or focal lesion. Sinuses/Orbits: No acute finding. Other: None. CT CERVICAL SPINE FINDINGS Alignment: Straightening of the cervical spine. Skull base and vertebrae: No acute fracture. No primary bone lesion or focal pathologic process. Soft tissues and spinal canal: No prevertebral fluid or swelling. No visible canal hematoma. Disc levels: Multilevel degenerate disc disease with disc height loss and marginal osteophytes with associated uncovertebral joint and facet joint arthropathy. C2-C3:  No significant findings. C3-C4: Disc height loss and uncovertebral joint arthropathy with mild right and moderate left neural foraminal stenosis. Mild left facet joint arthropathy. C4-C5: Disc height loss and marked uncovertebral joint arthropathy with moderate-to-severe bilateral neural foraminal stenosis. C5-C6: Disc height loss and uncovertebral joint arthropathy with moderate right and severe left neural foraminal stenosis. C6-C7: Disc height loss and uncovertebral joint arthropathy with mild left neural foraminal stenosis. C7-T1: Disc height loss and facet joint arthropathy. No significant neural foraminal stenosis. Upper chest: Negative. Other: None IMPRESSION: CT HEAD: 1. No acute intracranial abnormality. 2. Moderate cerebral volume loss and advanced chronic microvascular ischemic changes of the white matter. 3. Prominence of the ventricles more than expected for the cerebral volume loss concerning for normal pressure hydrocephalus. CT CERVICAL SPINE: 1. No acute fracture or traumatic subluxation. 2. Multilevel degenerate disc disease with associated uncovertebral joint and facet  joint arthropathy. Electronically Signed   By: Larose Hires D.O.   On: 07/01/2022 23:25   CT Cervical Spine Wo Contrast  Result Date: 07/01/2022 CLINICAL DATA:  Head and neck trauma. EXAM: CT HEAD WITHOUT CONTRAST CT CERVICAL SPINE WITHOUT CONTRAST TECHNIQUE: Multidetector CT imaging of the head and cervical spine was performed following the standard protocol without intravenous contrast. Multiplanar CT image reconstructions of the cervical spine were also generated. RADIATION DOSE REDUCTION: This exam was performed according to the departmental dose-optimization program which includes automated exposure control, adjustment of the mA and/or kV according to patient size and/or use of iterative reconstruction technique. COMPARISON:  CT examination dated February 16, 2018 FINDINGS: CT HEAD FINDINGS Brain: No evidence of acute infarction, hemorrhage, extra-axial collection or mass lesion/mass effect. Prominence of the ventricles and sulci secondary to moderate cerebral volume loss. Diffuse low-attenuation of the periventricular white matter presumed advanced chronic microvascular ischemic changes of the white matter. Prominence of the ventricles disproportionate to the volume loss concerning for normal pressure hydrocephalus. Vascular: No hyperdense vessel or unexpected calcification. Skull: Normal. Negative for fracture or focal lesion. Sinuses/Orbits: No acute finding. Other: None. CT CERVICAL SPINE FINDINGS Alignment: Straightening of the cervical spine. Skull base and vertebrae: No acute fracture. No primary bone lesion or focal pathologic process. Soft tissues and spinal canal: No prevertebral fluid or swelling. No visible canal hematoma. Disc levels: Multilevel degenerate disc disease with disc height loss and marginal osteophytes with associated uncovertebral joint and facet joint arthropathy. C2-C3:  No significant findings. C3-C4: Disc height loss and uncovertebral joint arthropathy with mild right and  moderate left neural foraminal stenosis. Mild left facet joint arthropathy. C4-C5: Disc height loss and marked uncovertebral joint arthropathy with moderate-to-severe bilateral neural foraminal stenosis. C5-C6: Disc height loss and uncovertebral joint arthropathy with moderate right and severe left neural  foraminal stenosis. C6-C7: Disc height loss and uncovertebral joint arthropathy with mild left neural foraminal stenosis. C7-T1: Disc height loss and facet joint arthropathy. No significant neural foraminal stenosis. Upper chest: Negative. Other: None IMPRESSION: CT HEAD: 1. No acute intracranial abnormality. 2. Moderate cerebral volume loss and advanced chronic microvascular ischemic changes of the white matter. 3. Prominence of the ventricles more than expected for the cerebral volume loss concerning for normal pressure hydrocephalus. CT CERVICAL SPINE: 1. No acute fracture or traumatic subluxation. 2. Multilevel degenerate disc disease with associated uncovertebral joint and facet joint arthropathy. Electronically Signed   By: Larose Hires D.O.   On: 07/01/2022 23:25   DG Chest 1 View  Result Date: 07/01/2022 CLINICAL DATA:  Altered mental status. EXAM: CHEST  1 VIEW COMPARISON:  Chest x-ray 09/06/2013 FINDINGS: The heart and mediastinal contours are unchanged. Aortic calcification. No focal consolidation. No pulmonary edema. No pleural effusion. No pneumothorax. No acute osseous abnormality. Severe degenerative changes of the bilateral shoulders. IMPRESSION: No active disease. Electronically Signed   By: Tish Frederickson M.D.   On: 07/01/2022 22:44    Assessment/Plan 1. Acute metabolic encephalopathy No recurrence since he has been a patient here  2. AF (paroxysmal atrial fibrillation) (HCC) Continues with Eliquis and diltiazem  3. Bipolar 1 disorder (HCC) Followed by psychiatry long-term use of lithium  4. Cognitive impairment Patient chronically confused some days worse than others but as  noted above today he was waiting in the hall for his wife who had been here earlier in the day and he did not remember  5. Gait instability Uses wheelchair for locomotion; no recent falls    Family/ staff Communication:   Labs/tests ordered:  .smmsig

## 2023-01-20 ENCOUNTER — Encounter: Payer: Self-pay | Admitting: Nurse Practitioner

## 2023-01-20 ENCOUNTER — Non-Acute Institutional Stay (SKILLED_NURSING_FACILITY): Payer: Medicare Other | Admitting: Nurse Practitioner

## 2023-01-20 DIAGNOSIS — R251 Tremor, unspecified: Secondary | ICD-10-CM | POA: Diagnosis not present

## 2023-01-20 DIAGNOSIS — F319 Bipolar disorder, unspecified: Secondary | ICD-10-CM | POA: Diagnosis not present

## 2023-01-20 DIAGNOSIS — R4189 Other symptoms and signs involving cognitive functions and awareness: Secondary | ICD-10-CM | POA: Diagnosis not present

## 2023-01-20 DIAGNOSIS — M810 Age-related osteoporosis without current pathological fracture: Secondary | ICD-10-CM

## 2023-01-20 DIAGNOSIS — M159 Polyosteoarthritis, unspecified: Secondary | ICD-10-CM

## 2023-01-20 DIAGNOSIS — U071 COVID-19: Secondary | ICD-10-CM | POA: Diagnosis not present

## 2023-01-20 DIAGNOSIS — Z8673 Personal history of transient ischemic attack (TIA), and cerebral infarction without residual deficits: Secondary | ICD-10-CM

## 2023-01-20 DIAGNOSIS — R2681 Unsteadiness on feet: Secondary | ICD-10-CM

## 2023-01-20 DIAGNOSIS — I4819 Other persistent atrial fibrillation: Secondary | ICD-10-CM

## 2023-01-20 NOTE — Assessment & Plan Note (Signed)
EF 45-50% 11/2021, not on diuretics, trace edema BLE 

## 2023-01-20 NOTE — Assessment & Plan Note (Signed)
SNF [facility] for supportive care, 07/16/22 MMSE 18/30 

## 2023-01-20 NOTE — Assessment & Plan Note (Signed)
Heart rate is in control, on Diltiazem, Eliquis 

## 2023-01-20 NOTE — Assessment & Plan Note (Signed)
no focal weakness residual.

## 2023-01-20 NOTE — Assessment & Plan Note (Signed)
stopped parkinson's medication because of affected his cognition, had resting tremor in left arm/leg in the past

## 2023-01-20 NOTE — Assessment & Plan Note (Signed)
12/01/22 dc'd Alendronate tscore -2.9 2017, -0.9 01/19/19, refused DEXA 11/02/22

## 2023-01-20 NOTE — Assessment & Plan Note (Signed)
uncontrolled shoulder pain at night, not new, progressing gradually, shoulders pain, worsens at night, limited abduction ROM over shoulder level, working with OT presently.  Continue Tylenol bid, adding 1/2 Norco 5/325mg  hs.

## 2023-01-20 NOTE — Assessment & Plan Note (Signed)
uses w/c for mobility mostly.

## 2023-01-20 NOTE — Progress Notes (Unsigned)
Location:   [facility] Room Number: 18 Place of Service:  SNF (31) Provider: Arna Snipe Evetta Renner NP  Moshe Cipro, NP  Patient Care Team: Moshe Cipro, NP as PCP - General (Family Medicine) Georgeanna Lea, MD as PCP - Cardiology (Cardiology)  Extended Emergency Contact Information Primary Emergency Contact: Yip,[name] Address: [phone].           [facility] [address]          [place], [place] [address] [name] Home Phone: [phone] Mobile Phone: [phone] Relation: Spouse Secondary Emergency Contact: [name] Address: [address]          [place], [place] [address] [place] Home Phone: [phone] Mobile Phone: [phone] Relation: Son  Code Status: DNR Goals of care: Advanced Directive information    10/30/2022   10:59 AM  Advanced Directives  Does Patient Have a Medical Advance Directive? Yes  Type of Advance Directive Out of facility DNR (pink MOST or yellow form)  Does patient want to make changes to medical advance directive? No - Patient declined  Pre-existing out of facility DNR order (yellow form or pink MOST form) Yellow form placed in chart (order not valid for inpatient use)     Chief Complaint  Patient presents with  . Acute Visit    OA pain in shoulders    HPI:  Pt is a 89 y.o. male seen today for an acute visit for uncontrolled shoulder pain at night, not new, progressing gradually, shoulders pain, worsens at night, limited abduction ROM over shoulder level, working with OT presently.    Parkinson's disease, stopped parkinson's medication because of affected his cognition, had resting tremor in left arm/leg in the past             OP 12/01/22 dc'd Alendronate tscore -2.9 2017, -0.9 01/19/19, refused DEXA 11/02/22             OA shoulders pain, worsens at night, limited abduction ROM over shoulder level, takes Tylenol.              Cognitive impairment, SNF [facility] for supportive  care, 07/16/22 MMSE 18/30             CVA, no focal weakness residual.              Afib, on Diltiazem, Eliquis             Bipolar disorder, followed by psychiatry, on Lithium, TSH 0.953 07/12/22, 07/16/22 Lithium level 0.6(0.6-1.2)             CHF, EF 45-50% 11/2021, not on diuretics, trace edema BLE             Gait abnormality, uses w/c for mobility mostly.              Hyponatremia, Na 138 08/04/22             Hyperbilirubinemia, bilirubin 1.4 07/16/22            Polycythemia,  Hgb 16.3 07/14/22             CKD, Bun/creat 21/1.1 08/04/22             Hypogonadism, takes testosterone inj             Hyperlipidemia, takes Pravastatin             Glaucoma, eye drops.   Past Medical History:  Diagnosis Date  . AF (paroxysmal atrial fibrillation) (HCC)  02/16/2018  . Atrial fibrillation (HCC)   . Atrial fibrillation with RVR (HCC) 02/16/2018  . Bipolar 1 disorder (HCC)   . Bipolar disorder (HCC) 02/17/2018  . Dizziness 02/26/2020  . DJD (degenerative joint disease)   . Dyslipidemia 02/26/2020  . Gait instability 02/16/2018  . Hypertension   . Mitral regurgitation    mild   . Polycythemia 02/16/2018  . Renal insufficiency   . Stroke (HCC)   . Tremor 02/16/2018  . Tremors of nervous system 01/2018   Past Surgical History:  Procedure Laterality Date  . SHOULDER SURGERY      Allergies  Allergen Reactions  . Lipitor [Atorvastatin] Other (See Comments)    Joint soreness    Allergies as of 01/20/2023       Reactions   Lipitor [atorvastatin] Other (See Comments)   Joint soreness        Medication List        Accurate as of January 20, 2023 11:59 PM. If you have any questions, ask your nurse or doctor.          acetaminophen 500 MG tablet Commonly known as: TYLENOL Take 500 mg by mouth 2 (two) times daily.   diltiazem 120 MG 24 hr capsule Commonly known as: CARDIZEM CD TAKE ONE CAPSULE BY MOUTH DAILY   Eliquis 5 MG Tabs tablet Generic drug: apixaban TAKE 1 TABLET BY MOUTH  TWICE DAILY.   latanoprost 0.005 % ophthalmic solution Commonly known as: XALATAN Place 1 drop into both eyes at bedtime.   lithium carbonate 300 MG ER tablet Commonly known as: LITHOBID Take 1 tablet (300 mg total) by mouth every evening.   multivitamin with minerals Tabs tablet Take 1 tablet by mouth daily.   pravastatin 20 MG tablet Commonly known as: PRAVACHOL Take 1 tablet (20 mg total) by mouth every evening.   testosterone cypionate 200 MG/ML injection Commonly known as: DEPOTESTOSTERONE CYPIONATE Inject 1 mL (200 mg total) into the muscle every 21 ( twenty-one) days. Every 3 weeks   timolol 0.5 % ophthalmic solution Commonly known as: TIMOPTIC Place 1 drop into both eyes daily.   VITAMIN C PO Take 1 tablet by mouth daily.        Review of Systems  Constitutional:  Negative for appetite change, fatigue and fever.  HENT:  Positive for hearing loss. Negative for congestion, ear discharge, ear pain and trouble swallowing.        C/o ear vibrating at night   Eyes:  Negative for visual disturbance.  Respiratory:  Negative for cough, shortness of breath and wheezing.   Cardiovascular:  Positive for leg swelling. Negative for chest pain and palpitations.  Gastrointestinal:  Negative for abdominal pain and constipation.  Genitourinary:  Negative for dysuria and urgency.  Musculoskeletal:  Positive for arthralgias, gait problem and joint swelling.       Shoulders pain with above shoulder level abduction ROM, worsens at night.   Skin:  Negative for color change.  Neurological:  Negative for speech difficulty, weakness and headaches.  Psychiatric/Behavioral:  Positive for confusion. Negative for sleep disturbance. The patient is not nervous/anxious.     Immunization History  Administered Date(s) Administered  . Influenza-Unspecified 04/15/2022  . PPD Test 07/08/2022  . Pneumococcal Conjugate-13 04/26/2014  . Pneumococcal Polysaccharide-23 04/14/2005  . Tdap  05/18/2016   Pertinent  Health Maintenance Due  Topic Date Due  . INFLUENZA VACCINE  01/28/2023      07/08/2022    9:22 AM 07/10/2022   11:13  AM 08/03/2022    4:30 PM 08/25/2022   11:49 AM 09/24/2022    1:55 PM  Fall Risk  Falls in the past year?  1 1 1  0  Was there an injury with Fall?  1 1 0 0  Fall Risk Category Calculator  3 3 1  0  Fall Risk Category (Retired)  High     (RETIRED) Patient Fall Risk Level High fall risk High fall risk     Patient at Risk for Falls Due to  History of fall(s);Impaired balance/gait;Impaired mobility History of fall(s);Impaired balance/gait;Impaired mobility No Fall Risks   Fall risk Follow up  Falls evaluation completed Falls evaluation completed Falls evaluation completed    Functional Status Survey:    Vitals:   01/20/23 1253  BP: 102/64  Pulse: 88  Resp: 18  Temp: 98.1 F (36.7 C)  SpO2: 97%  Weight: 185 lb 1.6 oz (84 kg)   Body mass index is 29.88 kg/m. Physical Exam Vitals and nursing note reviewed.  Constitutional:      Appearance: Normal appearance.  HENT:     Head: Normocephalic and atraumatic.     Nose: Nose normal.     Mouth/Throat:     Mouth: Mucous membranes are moist.  Eyes:     Extraocular Movements: Extraocular movements intact.     Conjunctiva/sclera: Conjunctivae normal.     Pupils: Pupils are equal, round, and reactive to light.  Cardiovascular:     Rate and Rhythm: Normal rate. Rhythm irregular.     Heart sounds: No murmur heard. Pulmonary:     Effort: Pulmonary effort is normal.     Breath sounds: No rales.  Abdominal:     General: Bowel sounds are normal.     Palpations: Abdomen is soft.     Tenderness: There is no abdominal tenderness.  Musculoskeletal:        General: Tenderness present. No swelling, deformity or signs of injury.     Cervical back: Normal range of motion and neck supple.     Right lower leg: Edema present.     Left lower leg: Edema present.     Comments: Minimal edema BLE. Shoulders  pain with above shoulder level abduction ROM, worsens at night, no deformity, redness, warmth, or swelling.   Skin:    General: Skin is warm and dry.     Comments: The right 5th toe nail yellow thick, no trauma/injury, no redness or warmth of the toe.   Neurological:     General: No focal deficit present.     Mental Status: He is alert and oriented to person, place, and time. Mental status is at baseline.     Motor: No weakness.     Gait: Gait abnormal.  Psychiatric:        Mood and Affect: Mood normal.        Behavior: Behavior normal.        Thought Content: Thought content normal.    Labs reviewed: Recent Labs    07/03/22 0816 07/04/22 1101 07/05/22 0309 07/06/22 0406 08/04/22 0000  NA 134* 135 134* 135 138  K 4.4 4.1 4.2 3.8 4.8  CL 102 106 103 105 105  CO2 23 21* 21* 22 21  GLUCOSE 92 113* 90 88  --   BUN 13 15 15 15 21   CREATININE 1.20 1.14 1.19 1.11 1.1  CALCIUM 9.6 9.1 9.1 9.1 10.1  MG 1.9 1.9  --  1.9  --   PHOS 2.8  --   --  2.6  --    Recent Labs    07/04/22 1101 07/05/22 0309 07/06/22 0406 07/16/22 0000  AST 105* 87* 64* 26  ALT 38 38 38 27  ALKPHOS 55 57 58 75  BILITOT 1.4* 1.5* 1.5*  --   PROT 6.5 6.5 6.4*  --   ALBUMIN 3.3* 3.2* 3.1* 3.8   Recent Labs    07/01/22 2346 07/03/22 0816 07/04/22 1101 07/16/22 0000  WBC 12.8* 10.7* 8.9 8.6  NEUTROABS 11.2*  --   --  5,848.00  HGB 15.7 16.8 15.9 16.2  HCT 49.0 49.6 46.8 48  MCV 96.5 92.4 91.9  --   PLT 207 171 187 420*   Lab Results  Component Value Date   TSH 0.953 07/02/2022   Lab Results  Component Value Date   HGBA1C  08/24/2009    5.7 (NOTE) The ADA recommends the following therapeutic goal for glycemic control related to Hgb A1c measurement: Goal of therapy: <6.5 Hgb A1c  Reference: American Diabetes Association: Clinical Practice Recommendations 2010, Diabetes Care, 2010, 33: (Suppl  1).   Lab Results  Component Value Date   CHOL 111 09/02/2020   HDL 34 (L) 09/02/2020   LDLCALC  51 09/02/2020   LDLDIRECT 55 09/02/2020   TRIG 154 (H) 09/02/2020   CHOLHDL 3.3 09/02/2020    Significant Diagnostic Results in last 30 days:  No results found.  Assessment/Plan: Osteoarthritis, multiple sites  uncontrolled shoulder pain at night, not new, progressing gradually, shoulders pain, worsens at night, limited abduction ROM over shoulder level, working with OT presently.  Continue Tylenol bid, adding 1/2 Norco 5/325mg  hs.   Tremor stopped parkinson's medication because of affected his cognition, had resting tremor in left arm/leg in the past  Osteoporosis 12/01/22 dc'd Alendronate tscore -2.9 2017, -0.9 01/19/19, refused DEXA 11/02/22  Cognitive impairment SNF [facility] for supportive care, 07/16/22 MMSE 18/30  History of stroke no focal weakness residual.   Atrial fibrillation, persistent (HCC) Heart rate is in control, on Diltiazem, Eliquis  Bipolar disorder (HCC) followed by psychiatry, on Lithium, TSH 0.953 07/12/22, 07/16/22 Lithium level 0.6(0.6-1.2)  Chronic CHF (HCC) EF 45-50% 11/2021, not on diuretics, trace edema BLE  Gait instability  uses w/c for mobility mostly.     Family/ staff Communication: plan of care reviewed with the patient and charge nurse.   Labs/tests ordered:  none  Time spend 35 minutes.

## 2023-01-20 NOTE — Assessment & Plan Note (Signed)
followed by psychiatry, on Lithium, TSH 0.953 07/12/22, 07/16/22 Lithium level 0.6(0.6-1.2) 

## 2023-01-21 DIAGNOSIS — F319 Bipolar disorder, unspecified: Secondary | ICD-10-CM | POA: Diagnosis not present

## 2023-01-21 DIAGNOSIS — U071 COVID-19: Secondary | ICD-10-CM | POA: Diagnosis not present

## 2023-01-25 DIAGNOSIS — F319 Bipolar disorder, unspecified: Secondary | ICD-10-CM | POA: Diagnosis not present

## 2023-01-25 DIAGNOSIS — U071 COVID-19: Secondary | ICD-10-CM | POA: Diagnosis not present

## 2023-01-27 DIAGNOSIS — U071 COVID-19: Secondary | ICD-10-CM | POA: Diagnosis not present

## 2023-01-27 DIAGNOSIS — F319 Bipolar disorder, unspecified: Secondary | ICD-10-CM | POA: Diagnosis not present

## 2023-01-28 DIAGNOSIS — M6281 Muscle weakness (generalized): Secondary | ICD-10-CM | POA: Diagnosis not present

## 2023-02-01 DIAGNOSIS — M6281 Muscle weakness (generalized): Secondary | ICD-10-CM | POA: Diagnosis not present

## 2023-02-02 DIAGNOSIS — M6281 Muscle weakness (generalized): Secondary | ICD-10-CM | POA: Diagnosis not present

## 2023-02-04 DIAGNOSIS — M6281 Muscle weakness (generalized): Secondary | ICD-10-CM | POA: Diagnosis not present

## 2023-02-08 ENCOUNTER — Encounter: Payer: Self-pay | Admitting: Nurse Practitioner

## 2023-02-08 ENCOUNTER — Non-Acute Institutional Stay (SKILLED_NURSING_FACILITY): Payer: Medicare Other | Admitting: Nurse Practitioner

## 2023-02-08 DIAGNOSIS — M810 Age-related osteoporosis without current pathological fracture: Secondary | ICD-10-CM

## 2023-02-08 DIAGNOSIS — R4189 Other symptoms and signs involving cognitive functions and awareness: Secondary | ICD-10-CM | POA: Diagnosis not present

## 2023-02-08 DIAGNOSIS — M159 Polyosteoarthritis, unspecified: Secondary | ICD-10-CM

## 2023-02-08 DIAGNOSIS — F319 Bipolar disorder, unspecified: Secondary | ICD-10-CM

## 2023-02-08 DIAGNOSIS — N1831 Chronic kidney disease, stage 3a: Secondary | ICD-10-CM

## 2023-02-08 DIAGNOSIS — I5042 Chronic combined systolic (congestive) and diastolic (congestive) heart failure: Secondary | ICD-10-CM

## 2023-02-08 DIAGNOSIS — Z8673 Personal history of transient ischemic attack (TIA), and cerebral infarction without residual deficits: Secondary | ICD-10-CM

## 2023-02-08 DIAGNOSIS — M6281 Muscle weakness (generalized): Secondary | ICD-10-CM | POA: Diagnosis not present

## 2023-02-08 DIAGNOSIS — I4819 Other persistent atrial fibrillation: Secondary | ICD-10-CM | POA: Diagnosis not present

## 2023-02-08 DIAGNOSIS — D751 Secondary polycythemia: Secondary | ICD-10-CM

## 2023-02-08 NOTE — Assessment & Plan Note (Signed)
12/01/22 dc'd Alendronate tscore -2.9 2017, -0.9 01/19/19, refused DEXA 11/02/22, will try Ca/Vit D

## 2023-02-08 NOTE — Progress Notes (Signed)
Location:   [facility] Room Number: 18 Place of Service:  SNF (31) Provider: Arna Snipe  NP  Moshe Cipro, NP  Patient Care Team: Moshe Cipro, NP as PCP - General (Family Medicine) Georgeanna Lea, MD as PCP - Cardiology (Cardiology)  Extended Emergency Contact Information Primary Emergency Contact: Flanary,[name] Address: [phone].           [facility] [address]          [place], [place] [address] [name] of [place] Home Phone: [phone] Mobile Phone: [phone] Relation: Spouse Secondary Emergency Contact: [name] Address: [address]          [place], [place] [address] [place] Home Phone: [phone] Mobile Phone: [phone] Relation: Son  Code Status:  DNR Goals of care: Advanced Directive information    10/30/2022   10:59 AM  Advanced Directives  Does Patient Have a Medical Advance Directive? Yes  Type of Advance Directive Out of facility DNR (pink MOST or yellow form)  Does patient want to make changes to medical advance directive? No - Patient declined  Pre-existing out of facility DNR order (yellow form or pink MOST form) Yellow form placed in chart (order not valid for inpatient use)     Chief Complaint  Patient presents with   Medical Management of Chronic Issues    HPI:  Pt is a 89 y.o. male seen today for medical management of chronic diseases.     Parkinson's disease, stopped parkinson's medication because of affected his cognition, had resting tremor in left arm/leg in the past             OP 12/01/22 dc'd Alendronate tscore -2.9 2017, -0.9 01/19/19, refused DEXA 11/02/22             OA shoulders pain, worsens at night, limited abduction ROM over shoulder level, hx of neck clicks with ROM, aches, c/o right sciatica pain, worsens in am, Norco is effective.              Cognitive impairment, SNF [facility] for supportive care, 07/16/22 MMSE 18/30             CVA, no focal weakness residual.               Afib, on Diltiazem, Eliquis             Bipolar disorder, followed by psychiatry, on Lithium, TSH 0.953 07/12/22, 07/16/22 Lithium level 0.6(0.6-1.2)             CHF, EF 45-50% 11/2021, not on diuretics, trace edema BLE             Gait abnormality, uses w/c for mobility mostly.              Hyponatremia, Na 138 08/04/22             Hyperbilirubinemia, bilirubin 1.4 07/16/22            Polycythemia,  Hgb 16.3 07/14/22             CKD, Bun/creat 21/1.1 08/04/22             Hypogonadism, takes testosterone inj             Hyperlipidemia, takes Pravastatin             Glaucoma, eye drops.    Past Medical History:  Diagnosis Date   AF (paroxysmal atrial fibrillation) (HCC) 02/16/2018   Atrial fibrillation (HCC)  Atrial fibrillation with RVR (HCC) 02/16/2018   Bipolar 1 disorder (HCC)    Bipolar disorder (HCC) 02/17/2018   Dizziness 02/26/2020   DJD (degenerative joint disease)    Dyslipidemia 02/26/2020   Gait instability 02/16/2018   Hypertension    Mitral regurgitation    mild    Polycythemia 02/16/2018   Renal insufficiency    Stroke [facility])    Tremor 02/16/2018   Tremors of nervous system 01/2018   Past Surgical History:  Procedure Laterality Date   SHOULDER SURGERY      Allergies  Allergen Reactions   Lipitor [Atorvastatin] Other (See Comments)    Joint soreness    Allergies as of 02/08/2023       Reactions   Lipitor [atorvastatin] Other (See Comments)   Joint soreness        Medication List        Accurate as of February 08, 2023  1:55 PM. If you have any questions, ask your nurse or doctor.          acetaminophen 500 MG tablet Commonly known as: TYLENOL Take 500 mg by mouth 2 (two) times daily.   diltiazem 120 MG 24 hr capsule Commonly known as: CARDIZEM CD TAKE ONE CAPSULE BY MOUTH DAILY   Eliquis 5 MG Tabs tablet Generic drug: apixaban TAKE 1 TABLET BY MOUTH TWICE DAILY.   latanoprost 0.005 % ophthalmic solution Commonly known as:  XALATAN Place 1 drop into both eyes at bedtime.   lithium carbonate 300 MG ER tablet Commonly known as: LITHOBID Take 1 tablet (300 mg total) by mouth every evening.   multivitamin with minerals Tabs tablet Take 1 tablet by mouth daily.   pravastatin 20 MG tablet Commonly known as: PRAVACHOL Take 1 tablet (20 mg total) by mouth every evening.   testosterone cypionate 200 MG/ML injection Commonly known as: DEPOTESTOSTERONE CYPIONATE Inject 1 mL (200 mg total) into the muscle every 21 ( twenty-one) days. Every 3 weeks   timolol 0.5 % ophthalmic solution Commonly known as: TIMOPTIC Place 1 drop into both eyes daily.   VITAMIN C PO Take 1 tablet by mouth daily.        Review of Systems  Constitutional:  Negative for appetite change, fatigue and fever.  HENT:  Positive for hearing loss. Negative for congestion, ear discharge, ear pain and trouble swallowing.        C/o ear vibrating at night   Eyes:  Negative for visual disturbance.  Respiratory:  Negative for cough, shortness of breath and wheezing.   Cardiovascular:  Positive for leg swelling. Negative for chest pain and palpitations.  Gastrointestinal:  Negative for abdominal pain and constipation.  Genitourinary:  Negative for dysuria and urgency.  Musculoskeletal:  Positive for arthralgias, back pain, gait problem and joint swelling.       Shoulders pain with above shoulder level abduction ROM, worsens at night. R sciatic pain, not new, worse in am when getting up  Skin:  Negative for color change.  Neurological:  Negative for speech difficulty, weakness and headaches.  Psychiatric/Behavioral:  Positive for confusion. Negative for sleep disturbance. The patient is not nervous/anxious.     Immunization History  Administered Date(s) Administered   Influenza-Unspecified 04/15/2022   PPD Test 07/08/2022   Pneumococcal Conjugate-13 04/26/2014   Pneumococcal Polysaccharide-23 04/14/2005   Tdap 05/18/2016   Pertinent   Health Maintenance Due  Topic Date Due   INFLUENZA VACCINE  01/28/2023      07/08/2022    9:22 AM  07/10/2022   11:13 AM 08/03/2022    4:30 PM 08/25/2022   11:49 AM 09/24/2022    1:55 PM  Fall Risk  Falls in the past year?  1 1 1  0  Was there an injury with Fall?  1 1 0 0  Fall Risk Category Calculator  3 3 1  0  Fall Risk Category (Retired)  High     (RETIRED) Patient Fall Risk Level High fall risk High fall risk     Patient at Risk for Falls Due to  History of fall(s);Impaired balance/gait;Impaired mobility History of fall(s);Impaired balance/gait;Impaired mobility No Fall Risks   Fall risk Follow up  Falls evaluation completed Falls evaluation completed Falls evaluation completed    Functional Status Survey:    Vitals:   02/08/23 1341  BP: 116/74  Pulse: 84  Resp: 16  Temp: 97.7 F (36.5 C)  SpO2: 97%  Weight: 174 lb (78.9 kg)   Body mass index is 28.08 kg/m. Physical Exam Vitals and nursing note reviewed.  Constitutional:      Appearance: Normal appearance.  HENT:     Head: Normocephalic and atraumatic.     Nose: Nose normal.     Mouth/Throat:     Mouth: Mucous membranes are moist.  Eyes:     Extraocular Movements: Extraocular movements intact.     Conjunctiva/sclera: Conjunctivae normal.     Pupils: Pupils are equal, round, and reactive to light.  Cardiovascular:     Rate and Rhythm: Normal rate. Rhythm irregular.     Heart sounds: No murmur heard. Pulmonary:     Effort: Pulmonary effort is normal.     Breath sounds: No rales.  Abdominal:     General: Bowel sounds are normal.     Palpations: Abdomen is soft.     Tenderness: There is no abdominal tenderness.  Musculoskeletal:        General: Tenderness present. No swelling, deformity or signs of injury.     Cervical back: Normal range of motion and neck supple.     Right lower leg: Edema present.     Left lower leg: Edema present.     Comments: Minimal edema BLE. Shoulders pain with above shoulder level  abduction ROM, worsens at night, R sciatica pain worse in am/getting out of bed  Skin:    General: Skin is warm and dry.     Comments: The right 5th toe nail yellow thick, no trauma/injury, no redness or warmth of the toe.   Neurological:     General: No focal deficit present.     Mental Status: He is alert and oriented to person, place, and time. Mental status is at baseline.     Motor: No weakness.     Gait: Gait abnormal.  Psychiatric:        Mood and Affect: Mood normal.        Behavior: Behavior normal.        Thought Content: Thought content normal.     Labs reviewed: Recent Labs    07/03/22 0816 07/04/22 1101 07/05/22 0309 07/06/22 0406 08/04/22 0000  NA 134* 135 134* 135 138  K 4.4 4.1 4.2 3.8 4.8  CL 102 106 103 105 105  CO2 23 21* 21* 22 21  GLUCOSE 92 113* 90 88  --   BUN 13 15 15 15 21   CREATININE 1.20 1.14 1.19 1.11 1.1  CALCIUM 9.6 9.1 9.1 9.1 10.1  MG 1.9 1.9  --  1.9  --  PHOS 2.8  --   --  2.6  --    Recent Labs    07/04/22 1101 07/05/22 0309 07/06/22 0406 07/16/22 0000  AST 105* 87* 64* 26  ALT 38 38 38 27  ALKPHOS 55 57 58 75  BILITOT 1.4* 1.5* 1.5*  --   PROT 6.5 6.5 6.4*  --   ALBUMIN 3.3* 3.2* 3.1* 3.8   Recent Labs    07/01/22 2346 07/03/22 0816 07/04/22 1101 07/16/22 0000  WBC 12.8* 10.7* 8.9 8.6  NEUTROABS 11.2*  --   --  5,848.00  HGB 15.7 16.8 15.9 16.2  HCT 49.0 49.6 46.8 48  MCV 96.5 92.4 91.9  --   PLT 207 171 187 420*   Lab Results  Component Value Date   TSH 0.953 07/02/2022   Lab Results  Component Value Date   HGBA1C  08/24/2009    5.7 (NOTE) The ADA recommends the following therapeutic goal for glycemic control related to Hgb A1c measurement: Goal of therapy: <6.5 Hgb A1c  Reference: American Diabetes Association: Clinical Practice Recommendations 2010, Diabetes Care, 2010, 33: (Suppl  1).   Lab Results  Component Value Date   CHOL 111 09/02/2020   HDL 34 (L) 09/02/2020   LDLCALC 51 09/02/2020   LDLDIRECT  55 09/02/2020   TRIG 154 (H) 09/02/2020   CHOLHDL 3.3 09/02/2020    Significant Diagnostic Results in last 30 days:  No results found.  Assessment/Plan  Osteoarthritis, multiple sites  shoulders pain, worsens at night, limited abduction ROM over shoulder level, hx of neck clicks with ROM, aches, c/o right sciatica pain, worsens in am, Norco is effective. Will increase Norco 5/325mg  1/2 tab to 6am and 9pm/hs only. Dc Tylenol. Observe. Update CBC/diff, CMP/eGFR, TSH   Cognitive impairment SNF [facility] for supportive care, 07/16/22 MMSE 18/30  History of stroke no focal weakness residual.   Atrial fibrillation, persistent (HCC) Heart rate is in control,  on Diltiazem, Eliquis  Bipolar disorder (HCC) Stable,  followed by psychiatry, on Lithium, TSH 0.953 07/12/22, 07/16/22 Lithium level 0.6(0.6-1.2)  Chronic CHF (HCC) EF 45-50% 11/2021, not on diuretics, trace edema BLE  Polycythemia Hgb 16.3 07/14/22, update CBC/diff  CKD (chronic kidney disease) stage 3, GFR 30-59 ml/min (HCC) Bun/creat 21/1.1 08/04/22, update CMP/eGFR in setting of Hx of hyperbilirubinemia.   Osteoporosis 12/01/22 dc'd Alendronate tscore -2.9 2017, -0.9 01/19/19, refused DEXA 11/02/22, will try Ca/Vit D   Family/ staff Communication: plan of care reviewed with the patient and charge nurse.   Labs/tests ordered: CBC/diff, CMP/eGFR, TSH  Time spend 35 minutes.

## 2023-02-08 NOTE — Assessment & Plan Note (Signed)
SNF [facility] for supportive care, 07/16/22 MMSE 18/30 

## 2023-02-08 NOTE — Assessment & Plan Note (Signed)
no focal weakness residual.

## 2023-02-08 NOTE — Assessment & Plan Note (Signed)
Stable,  followed by psychiatry, on Lithium, TSH 0.953 07/12/22, 07/16/22 Lithium level 0.6(0.6-1.2)

## 2023-02-08 NOTE — Assessment & Plan Note (Signed)
Hgb 16.3 07/14/22, update CBC/diff

## 2023-02-08 NOTE — Assessment & Plan Note (Signed)
Heart rate is in control, on Diltiazem, Eliquis 

## 2023-02-08 NOTE — Assessment & Plan Note (Signed)
shoulders pain, worsens at night, limited abduction ROM over shoulder level, hx of neck clicks with ROM, aches, c/o right sciatica pain, worsens in am, Norco is effective. Will increase Norco 5/325mg  1/2 tab to 6am and 9pm/hs only. Dc Tylenol. Observe. Update CBC/diff, CMP/eGFR, TSH

## 2023-02-08 NOTE — Assessment & Plan Note (Signed)
EF 45-50% 11/2021, not on diuretics, trace edema BLE 

## 2023-02-08 NOTE — Assessment & Plan Note (Signed)
Bun/creat 21/1.1 08/04/22, update CMP/eGFR in setting of Hx of hyperbilirubinemia.

## 2023-02-09 DIAGNOSIS — I10 Essential (primary) hypertension: Secondary | ICD-10-CM | POA: Diagnosis not present

## 2023-02-09 DIAGNOSIS — M6281 Muscle weakness (generalized): Secondary | ICD-10-CM | POA: Diagnosis not present

## 2023-02-09 LAB — CBC: RBC: 4.18 (ref 3.87–5.11)

## 2023-02-09 LAB — CBC AND DIFFERENTIAL
HCT: 39 — AB (ref 41–53)
Hemoglobin: 13.1 — AB (ref 13.5–17.5)
Neutrophils Absolute: 5415
Platelets: 287 10*3/uL (ref 150–400)
WBC: 7.5

## 2023-02-09 LAB — BASIC METABOLIC PANEL
BUN: 22 — AB (ref 4–21)
CO2: 25 — AB (ref 13–22)
Chloride: 105 (ref 99–108)
Creatinine: 1.2 (ref 0.6–1.3)
Glucose: 91
Potassium: 4.8 meq/L (ref 3.5–5.1)
Sodium: 138 (ref 137–147)

## 2023-02-09 LAB — COMPREHENSIVE METABOLIC PANEL
Albumin: 4.4 (ref 3.5–5.0)
Calcium: 10.7 (ref 8.7–10.7)
Globulin: 2.9
eGFR: 60

## 2023-02-09 LAB — HEPATIC FUNCTION PANEL
ALT: 14 U/L (ref 10–40)
AST: 21 (ref 14–40)
Alkaline Phosphatase: 82 (ref 25–125)
Bilirubin, Total: 1.3

## 2023-02-09 LAB — TSH: TSH: 1.42 (ref 0.41–5.90)

## 2023-02-11 DIAGNOSIS — M6281 Muscle weakness (generalized): Secondary | ICD-10-CM | POA: Diagnosis not present

## 2023-02-12 DIAGNOSIS — M6281 Muscle weakness (generalized): Secondary | ICD-10-CM | POA: Diagnosis not present

## 2023-02-17 ENCOUNTER — Encounter: Payer: Self-pay | Admitting: Psychiatry

## 2023-02-17 ENCOUNTER — Ambulatory Visit (INDEPENDENT_AMBULATORY_CARE_PROVIDER_SITE_OTHER): Payer: Medicare Other | Admitting: Psychiatry

## 2023-02-17 DIAGNOSIS — G309 Alzheimer's disease, unspecified: Secondary | ICD-10-CM | POA: Diagnosis not present

## 2023-02-17 DIAGNOSIS — F319 Bipolar disorder, unspecified: Secondary | ICD-10-CM | POA: Diagnosis not present

## 2023-02-17 DIAGNOSIS — F0280 Dementia in other diseases classified elsewhere without behavioral disturbance: Secondary | ICD-10-CM

## 2023-02-17 DIAGNOSIS — F5105 Insomnia due to other mental disorder: Secondary | ICD-10-CM

## 2023-02-17 DIAGNOSIS — M6281 Muscle weakness (generalized): Secondary | ICD-10-CM | POA: Diagnosis not present

## 2023-02-17 DIAGNOSIS — Z79899 Other long term (current) drug therapy: Secondary | ICD-10-CM

## 2023-02-17 MED ORDER — DONEPEZIL HCL 10 MG PO TABS: ORAL_TABLET | ORAL | 1 refills | Status: DC

## 2023-02-17 MED ORDER — TRAZODONE HCL 50 MG PO TABS: 50.0000 mg | ORAL_TABLET | Freq: Every day | ORAL | 5 refills | Status: AC

## 2023-02-17 NOTE — Progress Notes (Signed)
Sean Bishop [id number] 02-11-1933 89 y.o.   Subjective:   Patient ID:  Sean Bishop CURRENT is a 89 y.o. (DOB [birth year]) male.  Chief Complaint:  Chief Complaint  Patient presents with   Follow-up   Memory Loss    HPI NIL AMIRIAN presents for follow-up of bipolar disorder.  seen August 2020.  He was having balance problems and lithium was reduced from 600 mg daily to 450 mg daily.  He was encouraged to get a lithium level as soon as possible.  08/02/2019 appointment with the following noted and without med changes. Overall doing very well.  Handling lockdown pretty well.  Couldn't go much of anywhere from the facility.  Missed socialization.  They are staying in [facility].  Not a bad facility.  No complaints with it.   No sig exercise.  Some walking in the building. Got both Covid vaccines.  Son Engineer, civil (consulting) in [place]. No sig tremor.  Some walking problems.  12/23/2020 appointment with the following noted:  seen with wife Health problems. On lithium 450 mg daily. Has had physical problems.  Using walker.   W thinks he's doing OK.  However she notes that he is memory has declined.  Sometimes he "remembers" things in a way that is different from the way they actually happened.  However he has not had any unusual mood swings. PCP says he cannot drive anymore bc neuropathy and physical problems. Plan: get lithium level  06/26/21 appt noted: Had trouble getting the lithium level.   Hip pain and needs walker.   No SE noted with lithium. Plan: no changes  12/24/21 appt noted: [facility]ident [facility].  [name] doing well. Doing well.  No unusual problems. Lithium 450 mg HS Not driving. No med swings since reduction in lithium. Patient reports stable mood and denies depressed or irritable moods.  Patient denies any recent difficulty with anxiety.  Patient denies difficulty with sleep initiation or maintenance. Denies appetite disturbance.  Patient reports that energy and motivation  have been good.  Patient reports some difficulty with concentration and memory.  Patient denies any suicidal ideation.  Aware of cognitive problems.   06/25/22 appt noted: [facility]ident [facility].  [name] doing well. Doing well.  No unusual problems. Lithium 450 mg HS Not driving. Requiring walker.   Admits forgetfulness.  No mood swings. Some health problems. No mood problems with mood or anxiety.  No psychosis.  No tremor..  02/17/23 appt noted:  seen with wife [name]. Losing memory.   Moved to [facility].   Meds reduced to lithium CR 300 mg HS Complaining of trouble going to sleep.   Some times of dep but not extended.   Wife says he gets a bit obsessive on things he has to do.  He acknowledges.  Can ruminate over health concerns.  Upset that he can't be with wife.   He's having some problems dealing with some things from the past per wife.  Hard for her to deal with it.   Tolerating meds.  Wife [name]. Adopted kids [name] nurse doing well;  [name] was murdered or suicide.  PCP [name] advised against driving bc history of syncope.  Past Psychiatric Medication Trials:  Decades of Lithium,  Lorazepam  Review of Systems:  Review of Systems  Constitutional:  Negative for fatigue.  Musculoskeletal:  Positive for arthralgias, back pain and gait problem.  Neurological:  Positive for weakness. Negative for tremors and syncope.  Psychiatric/Behavioral:  Positive for decreased concentration. Negative for agitation, behavioral problems, confusion, dysphoric mood, hallucinations, self-injury and suicidal ideas. The patient is not nervous/anxious and is not hyperactive.     Medications: I have reviewed the patient's current medications.  Current Outpatient Medications  Medication Sig Dispense Refill   acetaminophen (TYLENOL) 500 MG tablet Take 500 mg by mouth 2 (two) times daily.      Ascorbic Acid (VITAMIN C PO) Take 1 tablet by mouth daily.      diltiazem (CARDIZEM CD) 120 MG 24 hr capsule TAKE ONE CAPSULE BY MOUTH DAILY 90 capsule 3   ELIQUIS 5 MG TABS tablet TAKE 1 TABLET BY MOUTH TWICE DAILY. 180 tablet 1   latanoprost (XALATAN) 0.005 % ophthalmic solution Place 1 drop into both eyes at bedtime.     lithium carbonate (LITHOBID) 300 MG ER tablet Take 1 tablet (300 mg total) by mouth every evening. 90 tablet 1   Multiple Vitamin (MULTIVITAMIN WITH MINERALS) TABS tablet Take 1 tablet by mouth daily.     pravastatin (PRAVACHOL) 20 MG tablet Take 1 tablet (20 mg total) by mouth every evening. 90 tablet 1   testosterone cypionate (DEPOTESTOSTERONE CYPIONATE) 200 MG/ML injection Inject 1 mL (200 mg total) into the muscle every 21 ( twenty-one) days. Every 3 weeks 10 mL 0   timolol (TIMOPTIC) 0.5 % ophthalmic solution Place 1 drop into both eyes daily.     No current facility-administered medications for this visit.    Medication Side Effects: None  Allergies:  Allergies  Allergen Reactions   Lipitor [Atorvastatin] Other (See Comments)    Joint soreness    Past Medical History:  Diagnosis Date   AF (paroxysmal atrial fibrillation) (HCC) 02/16/2018   Atrial fibrillation (HCC)    Atrial fibrillation with RVR (HCC) 02/16/2018   Bipolar 1 disorder (HCC)    Bipolar disorder (HCC) 02/17/2018   Dizziness 02/26/2020   DJD (degenerative joint disease)    Dyslipidemia 02/26/2020   Gait instability 02/16/2018   Hypertension    Mitral regurgitation    mild    Polycythemia 02/16/2018   Renal insufficiency    Stroke [facility])    Tremor 02/16/2018   Tremors of nervous system 01/2018    Family History  Problem Relation Age of Onset   Alcohol abuse Mother    Cancer Father     Social History   Socioeconomic History   Marital status: Married    Spouse name: Not on file   Number of children: Not on file   Years of education: Not on file   Highest education level: Not on file  Occupational History   Not on file  Tobacco Use   Smoking  status: Former   Smokeless tobacco: Never  Vaping Use   Vaping status: Never Used  Substance and Sexual Activity   Alcohol use: Yes    Alcohol/week: 1.0 standard drink of alcohol    Types: 1 Glasses of wine per week    Comment: 1 per month   Drug use: No   Sexual activity: Not on file  Other Topics Concern   Not on file  Social History Narrative   Not on file   Social Determinants of Health   Financial Resource Strain: Not on file  Food Insecurity: No Food Insecurity (07/02/2022)   Hunger Vital Sign    Worried About Running Out of Food in the Last Year: Never true    Ran Out of Food in the Last Year: Never true  Transportation Needs:  No Transportation Needs (07/02/2022)   PRAPARE - Administrator, Civil Service (Medical): No    Lack of Transportation (Non-Medical): No  Physical Activity: Not on file  Stress: Not on file  Social Connections: Not on file  Intimate Partner Violence: Not At Risk (07/02/2022)   Humiliation, Afraid, Rape, and Kick questionnaire    Fear of Current or Ex-Partner: No    Emotionally Abused: No    Physically Abused: No    Sexually Abused: No    Past Medical History, Surgical history, Social history, and Family history were reviewed and updated as appropriate.   Please see review of systems for further details on the patient's review from today.   Objective:   Physical Exam:  There were no vitals taken for this visit.  Physical Exam Neurological:     Mental Status: He is alert and oriented to person, place, and time. Mental status is at baseline.     Cranial Nerves: No dysarthria.     Motor: No tremor.  Psychiatric:        Attention and Perception: Attention normal.        Mood and Affect: Mood normal. Mood is not anxious, depressed or elated.        Speech: Speech is not slurred.        Behavior: Behavior is cooperative.        Thought Content: Thought content normal. Thought content is not paranoid or delusional. Thought content  does not include homicidal or suicidal ideation. Thought content does not include suicidal plan.        Cognition and Memory: Cognition is impaired. Memory is impaired. He exhibits impaired recent memory and impaired remote memory.        Judgment: Judgment normal.     Comments: Talkative with fair focus  Mood good.  No mania. Somewhat difficult to direct but did ask questions. Word finding problems mildly worse 2024, August, not date Immed 3/3 7's= 93 World>DLRLW Recall @2  min=0/3      Lab Review:     Component Value Date/Time   NA 138 08/04/2022 0000   K 4.8 08/04/2022 0000   CL 105 08/04/2022 0000   CO2 21 08/04/2022 0000   GLUCOSE 88 07/06/2022 0406   BUN 21 08/04/2022 0000   CREATININE 1.1 08/04/2022 0000   CREATININE 1.11 07/06/2022 0406   CALCIUM 10.1 08/04/2022 0000   PROT 6.4 (L) 07/06/2022 0406   PROT 7.4 09/02/2020 1423   ALBUMIN 3.8 07/16/2022 0000   ALBUMIN 4.5 09/02/2020 1423   AST 26 07/16/2022 0000   ALT 27 07/16/2022 0000   ALKPHOS 75 07/16/2022 0000   BILITOT 1.5 (H) 07/06/2022 0406   BILITOT 1.4 (H) 09/02/2020 1423   GFRNONAA >60 07/06/2022 0406   GFRAA >60 02/17/2018 0129       Component Value Date/Time   WBC 8.6 07/16/2022 0000   WBC 8.9 07/04/2022 1101   RBC 5.26 (A) 07/16/2022 0000   HGB 16.2 07/16/2022 0000   HCT 48 07/16/2022 0000   PLT 420 (A) 07/16/2022 0000   MCV 91.9 07/04/2022 1101   MCH 31.2 07/04/2022 1101   MCHC 34.0 07/04/2022 1101   RDW 13.3 07/04/2022 1101   LYMPHSABS 0.5 (L) 07/01/2022 2346   MONOABS 1.0 07/01/2022 2346   EOSABS 0.0 07/01/2022 2346   BASOSABS 0.0 07/01/2022 2346    Lithium Lvl  Date Value Ref Range Status  07/01/2022 0.47 (L) 0.60 - 1.20 mmol/L Final    Comment:  Performed at [facility], [address], [place], [place] [address]   07/01/22 lithium level 0.47 on 300 mg daily.  No results found for: "PHENYTOIN", "PHENOBARB", "VALPROATE", "CBMZ"   Labs July 07, 2018 incl Li 0.9,  December 14, 2018 = Cr 1.29, Calcium 10.8, CMP normal  Normal lipids and PTH  12/22 Cr 1.34, Calcium 10.4  Cannot see any labs from 2023 in EPIC  .res Assessment: Plan:    Tarrance "Rosanne Ashing" was seen today for follow-up and memory loss.  Diagnoses and all orders for this visit:  Bipolar I disorder (HCC)  Mild cognitive impairment  Lithium use   Supportive therapy dealing with age related problems and health .  No worsening of his health since his last visit..  Not regained full capacity.  Cannot drive anymore DT aging.  Cognition seems stable. More distressed over insomnia and  But more distressed over NH placment and separated from wife over it.   ? Whether distress is primarily with NHP or progressing dementia.  Labs from June 18, 2021 showed creatinine 1.34 and calcium 10.4.  There was no lithium level evident. Cannot get lithium levels reliably but reduced dose to reduce toxicity risk.    Now in NH and can get lithium levels more.  07/01/22 lithium level 0.47 on 300 mg daily. Consider increasing.  Bipolar has been stable for many years.  No mood swings nor depression.  But more distressed now.  Counseled patient regarding potential benefits, risks, and side effects of lithium to include potential risk of lithium affecting thyroid and renal function.  Discussed need for periodic lab monitoring to determine drug level and to assess for potential adverse effects.  Counseled patient regarding signs and symptoms of lithium toxicity and advised that they notify office immediately or seek urgent medical attention if experiencing these signs and symptoms.  Patient advised to contact office with any questions or concerns. 07/2022 Cr is better in normal range.  Discussed the importance of avoiding lithium toxicity and how with aging the body has more difficulty eliminating lithium.  Therefore lithium dosages may need to be decreased as 1 ages.    They agree reduced lithium to 300 mg daily bc prior  problems getting lithium levels but level is borderline and he's more distressed but not manic.  Mild cognitive impairment  progressed to the point of moderate dementia.   Disc possible trial of donepezil   Donepezil 5 mg daily for a month, then 10 mg daily   Insomnia is causing clinical problems, reasonable trial trazodone 50 mg HS. Disc SE including fall risk  He is no longer driving and he is agreeable with this plan.  This appt was 30 mins.  FU 3 mos  Meredith Staggers, MD, DFAPA   Consider reducing lithium Please see After Visit Summary for patient specific instructions.  No future appointments.     No orders of the defined types were placed in this encounter.      -------------------------------

## 2023-02-18 DIAGNOSIS — D631 Anemia in chronic kidney disease: Secondary | ICD-10-CM | POA: Diagnosis not present

## 2023-02-18 DIAGNOSIS — I10 Essential (primary) hypertension: Secondary | ICD-10-CM | POA: Diagnosis not present

## 2023-02-18 DIAGNOSIS — M6281 Muscle weakness (generalized): Secondary | ICD-10-CM | POA: Diagnosis not present

## 2023-02-18 LAB — CBC: RBC: 4.12 (ref 3.87–5.11)

## 2023-02-18 LAB — BASIC METABOLIC PANEL
BUN: 24 — AB (ref 4–21)
CO2: 26 — AB (ref 13–22)
Chloride: 102 (ref 99–108)
Creatinine: 1.1 (ref 0.6–1.3)
Glucose: 93
Potassium: 4.7 meq/L (ref 3.5–5.1)
Sodium: 135 — AB (ref 137–147)

## 2023-02-18 LAB — CBC AND DIFFERENTIAL
HCT: 39 — AB (ref 41–53)
Hemoglobin: 12.9 — AB (ref 13.5–17.5)
Neutrophils Absolute: 4637
Platelets: 247 10*3/uL (ref 150–400)
WBC: 6.9

## 2023-02-18 LAB — HEPATIC FUNCTION PANEL
ALT: 9 U/L — AB (ref 10–40)
AST: 17 (ref 14–40)
Alkaline Phosphatase: 77 (ref 25–125)
Bilirubin, Total: 1

## 2023-02-18 LAB — COMPREHENSIVE METABOLIC PANEL
Albumin: 3.9 (ref 3.5–5.0)
Calcium: 10.5 (ref 8.7–10.7)
Globulin: 3
eGFR: 64

## 2023-02-22 DIAGNOSIS — M6281 Muscle weakness (generalized): Secondary | ICD-10-CM | POA: Diagnosis not present

## 2023-02-23 DIAGNOSIS — M6281 Muscle weakness (generalized): Secondary | ICD-10-CM | POA: Diagnosis not present

## 2023-02-25 DIAGNOSIS — M6281 Muscle weakness (generalized): Secondary | ICD-10-CM | POA: Diagnosis not present

## 2023-03-02 DIAGNOSIS — M6281 Muscle weakness (generalized): Secondary | ICD-10-CM | POA: Diagnosis not present

## 2023-03-03 DIAGNOSIS — M6281 Muscle weakness (generalized): Secondary | ICD-10-CM | POA: Diagnosis not present

## 2023-03-04 DIAGNOSIS — M6281 Muscle weakness (generalized): Secondary | ICD-10-CM | POA: Diagnosis not present

## 2023-03-08 DIAGNOSIS — M6281 Muscle weakness (generalized): Secondary | ICD-10-CM | POA: Diagnosis not present

## 2023-03-09 DIAGNOSIS — M6281 Muscle weakness (generalized): Secondary | ICD-10-CM | POA: Diagnosis not present

## 2023-03-11 DIAGNOSIS — M6281 Muscle weakness (generalized): Secondary | ICD-10-CM | POA: Diagnosis not present

## 2023-03-15 DIAGNOSIS — M6281 Muscle weakness (generalized): Secondary | ICD-10-CM | POA: Diagnosis not present

## 2023-03-16 ENCOUNTER — Non-Acute Institutional Stay (SKILLED_NURSING_FACILITY): Payer: Self-pay | Admitting: Nurse Practitioner

## 2023-03-16 ENCOUNTER — Encounter: Payer: Self-pay | Admitting: Nurse Practitioner

## 2023-03-16 DIAGNOSIS — Z66 Do not resuscitate: Secondary | ICD-10-CM | POA: Diagnosis not present

## 2023-03-16 DIAGNOSIS — M6281 Muscle weakness (generalized): Secondary | ICD-10-CM | POA: Diagnosis not present

## 2023-03-16 DIAGNOSIS — R4189 Other symptoms and signs involving cognitive functions and awareness: Secondary | ICD-10-CM

## 2023-03-16 DIAGNOSIS — M810 Age-related osteoporosis without current pathological fracture: Secondary | ICD-10-CM

## 2023-03-16 DIAGNOSIS — I4819 Other persistent atrial fibrillation: Secondary | ICD-10-CM | POA: Diagnosis not present

## 2023-03-16 DIAGNOSIS — F5101 Primary insomnia: Secondary | ICD-10-CM

## 2023-03-16 DIAGNOSIS — E871 Hypo-osmolality and hyponatremia: Secondary | ICD-10-CM

## 2023-03-16 DIAGNOSIS — R251 Tremor, unspecified: Secondary | ICD-10-CM

## 2023-03-16 DIAGNOSIS — Z8673 Personal history of transient ischemic attack (TIA), and cerebral infarction without residual deficits: Secondary | ICD-10-CM

## 2023-03-16 DIAGNOSIS — R2681 Unsteadiness on feet: Secondary | ICD-10-CM

## 2023-03-16 DIAGNOSIS — G4700 Insomnia, unspecified: Secondary | ICD-10-CM | POA: Insufficient documentation

## 2023-03-16 DIAGNOSIS — M150 Primary generalized (osteo)arthritis: Secondary | ICD-10-CM

## 2023-03-16 DIAGNOSIS — M159 Polyosteoarthritis, unspecified: Secondary | ICD-10-CM

## 2023-03-16 DIAGNOSIS — I5042 Chronic combined systolic (congestive) and diastolic (congestive) heart failure: Secondary | ICD-10-CM

## 2023-03-16 DIAGNOSIS — D751 Secondary polycythemia: Secondary | ICD-10-CM

## 2023-03-16 DIAGNOSIS — F319 Bipolar disorder, unspecified: Secondary | ICD-10-CM

## 2023-03-16 NOTE — Assessment & Plan Note (Signed)
12/01/22 dc'd Alendronate tscore -2.9 2017, -0.9 01/19/19, refused DEXA 11/02/22

## 2023-03-16 NOTE — Assessment & Plan Note (Signed)
stopped parkinson's medication because of affected his cognition, had resting tremor in left arm/leg in the past, no apparent presently.

## 2023-03-16 NOTE — Assessment & Plan Note (Signed)
uses w/c for mobility mostly.

## 2023-03-16 NOTE — Assessment & Plan Note (Signed)
bilirubin 1.0 02/18/23

## 2023-03-16 NOTE — Assessment & Plan Note (Signed)
Hgb 12.9 02/18/23

## 2023-03-16 NOTE — Assessment & Plan Note (Signed)
EF 45-50% 11/2021, not on diuretics, trace edema BLE

## 2023-03-16 NOTE — Assessment & Plan Note (Signed)
followed by psychiatry, on Lithium, TSH 1.42 02/09/23, 07/16/22 Lithium level 0.6(0.6-1.2)

## 2023-03-16 NOTE — Assessment & Plan Note (Signed)
no focal weakness residual.

## 2023-03-16 NOTE — Assessment & Plan Note (Signed)
Heart rate is in control, on Diltiazem, Eliquis

## 2023-03-16 NOTE — Progress Notes (Unsigned)
Location:  [facility] Room Number: [phone] Place of Service:  SNF [id number]) Provider:  Elita Dame X Meir Elwood  Moshe Cipro, FNP  Patient Care Team: Moshe Cipro, FNP as PCP - General (Family Medicine) Georgeanna Lea, MD as PCP - Cardiology (Cardiology)  Extended Emergency Contact Information Primary Emergency Contact: Spalla,[name] Address: [phone]           [facility] [address]          [place], [place] [address] [name] of [place] Home Phone: [phone] Mobile Phone: [phone] Relation: Spouse Secondary Emergency Contact: [name] Address: [address].          [place], [place] [address] [place] of [place] Home Phone: [phone] Mobile Phone: [phone] Relation: Son  Code Status:  DNR Goals of care: Advanced Directive information    03/16/2023   10:06 AM  Advanced Directives  Does Patient Have a Medical Advance Directive? Yes  Type of Advance Directive Out of facility DNR (pink MOST or yellow form);Healthcare Power of Attorney  Does patient want to make changes to medical advance directive? Yes (Inpatient - patient defers changing a medical advance directive at this time - Information given)  Copy of Healthcare Power of Attorney in Chart? Yes - validated most recent copy scanned in chart (See row information)     Chief Complaint  Patient presents with   Medical Management of Chronic Issues    HPI:  Pt is a 89 y.o. male seen today for managing chronic medical conditions.   Parkinson's disease, stopped parkinson's medication because of affected his cognition, had resting tremor in left arm/leg in the past             OP 12/01/22 dc'd Alendronate tscore -2.9 2017, -0.9 01/19/19, refused DEXA 11/02/22             OA shoulders pain, worsens at night, limited abduction ROM over shoulder level, hx of neck clicks with ROM, aches, c/o right sciatica pain, worsens in am, chronic left knee pain,  Norco is effective.               Cognitive impairment, SNF [facility] for supportive care, 07/16/22 MMSE 18/30, taking Deonepezil sine 8/21/2 by Psych             CVA, no focal weakness residual.              Afib, on Diltiazem, Eliquis             Bipolar disorder, followed by psychiatry, on Lithium, TSH 1.42 02/09/23, 07/16/22 Lithium level 0.6(0.6-1.2)  Insomnia, takes Trazodone since 02/17/23 by psych, still c/o difficulty falling asleep at night, but hesitant to take sleeping pills.              CHF, EF 45-50% 11/2021, not on diuretics, trace edema BLE             Gait abnormality, uses w/c for mobility mostly.              Hyponatremia, Na 135 02/18/23             Hyperbilirubinemia, bilirubin 1.0 02/18/23            Polycythemia,  Hgb 12.9 02/18/23             CKD, Bun/creat 24/1.1 02/18/23             Hypogonadism, takes testosterone inj  Hyperlipidemia, takes Pravastatin             Glaucoma, eye drops.     Past Medical History:  Diagnosis Date   AF (paroxysmal atrial fibrillation) (HCC) 02/16/2018   Atrial fibrillation (HCC)    Atrial fibrillation with RVR (HCC) 02/16/2018   Bipolar 1 disorder (HCC)    Bipolar disorder (HCC) 02/17/2018   Dizziness 02/26/2020   DJD (degenerative joint disease)    Dyslipidemia 02/26/2020   Gait instability 02/16/2018   Hypertension    Mitral regurgitation    mild    Polycythemia 02/16/2018   Renal insufficiency    Stroke [facility])    Tremor 02/16/2018   Tremors of nervous system 01/2018   Past Surgical History:  Procedure Laterality Date   SHOULDER SURGERY      Allergies  Allergen Reactions   Lipitor [Atorvastatin] Other (See Comments)    Joint soreness    Outpatient Encounter Medications as of 03/16/2023  Medication Sig   acetaminophen (TYLENOL) 500 MG tablet Take 500 mg by mouth 2 (two) times daily.    Ascorbic Acid (VITAMIN C PO) Take 1 tablet by mouth daily.   ascorbic acid (VITAMIN C) 500 MG tablet Take 500 mg by mouth daily.   diltiazem (CARDIZEM CD) 120  MG 24 hr capsule TAKE ONE CAPSULE BY MOUTH DAILY   donepezil (ARICEPT) 10 MG tablet 1/2 tablet daily for 4 weeks then 1 daily   donepezil (ARICEPT) 10 MG tablet Take 10 mg by mouth at bedtime.   ELIQUIS 5 MG TABS tablet TAKE 1 TABLET BY MOUTH TWICE DAILY.   HYDROcodone-acetaminophen (NORCO/VICODIN) 5-325 MG tablet Take 1 tablet by mouth daily as needed.   latanoprost (XALATAN) 0.005 % ophthalmic solution Place 1 drop into both eyes at bedtime.   lithium carbonate (LITHOBID) 300 MG ER tablet Take 1 tablet (300 mg total) by mouth every evening.   Multiple Vitamin (MULTIVITAMIN WITH MINERALS) TABS tablet Take 1 tablet by mouth daily.   Polyethyl Glycol-Propyl Glycol (SYSTANE) 0.4-0.3 % SOLN Apply 1 drop to eye every 4 (four) hours as needed (dry eyes).   pravastatin (PRAVACHOL) 20 MG tablet Take 1 tablet (20 mg total) by mouth every evening.   sodium chloride (MURO 128) 5 % ophthalmic solution Place 1 drop into both eyes at bedtime.   timolol (TIMOPTIC) 0.5 % ophthalmic solution Place 1 drop into both eyes daily.   traZODone (DESYREL) 50 MG tablet Take 1 tablet (50 mg total) by mouth at bedtime.   [DISCONTINUED] testosterone cypionate (DEPOTESTOSTERONE CYPIONATE) 200 MG/ML injection Inject 1 mL (200 mg total) into the muscle every 21 ( twenty-one) days. Every 3 weeks   No facility-administered encounter medications on file as of 03/16/2023.    Review of Systems  Constitutional:  Negative for appetite change, fatigue and fever.  HENT:  Positive for hearing loss. Negative for congestion, ear discharge, ear pain and trouble swallowing.        C/o ear vibrating at night   Eyes:  Negative for visual disturbance.  Respiratory:  Negative for cough, shortness of breath and wheezing.   Cardiovascular:  Positive for leg swelling.  Gastrointestinal:  Negative for abdominal pain and constipation.  Genitourinary:  Negative for dysuria and urgency.  Musculoskeletal:  Positive for arthralgias, back pain and  gait problem.       Shoulders pain with above shoulder level abduction ROM, worsens at night. R sciatic pain, not new, worse in am when getting up, chronic left knee pain  Skin:  Negative for color change.  Neurological:  Negative for speech difficulty, weakness and headaches.  Psychiatric/Behavioral:  Positive for confusion and sleep disturbance. The patient is not nervous/anxious.     Immunization History  Administered Date(s) Administered   Hepatitis B, ADULT 11/26/1997, 12/27/1997, 05/08/1998   Influenza-Unspecified 04/15/2022   PPD Test 07/08/2022   Pneumococcal Conjugate-13 04/26/2014   Pneumococcal Polysaccharide-23 04/14/2005   Td 08/04/2022   Tdap 05/18/2016   Zoster Recombinant(Shingrix) 07/29/2022, 10/28/2022   Pertinent  Health Maintenance Due  Topic Date Due   INFLUENZA VACCINE  01/28/2023      07/08/2022    9:22 AM 07/10/2022   11:13 AM 08/03/2022    4:30 PM 08/25/2022   11:49 AM 09/24/2022    1:55 PM  Fall Risk  Falls in the past year?  1 1 1  0  Was there an injury with Fall?  1 1 0 0  Fall Risk Category Calculator  3 3 1  0  Fall Risk Category (Retired)  High     (RETIRED) Patient Fall Risk Level High fall risk High fall risk     Patient at Risk for Falls Due to  History of fall(s);Impaired balance/gait;Impaired mobility History of fall(s);Impaired balance/gait;Impaired mobility No Fall Risks   Fall risk Follow up  Falls evaluation completed Falls evaluation completed Falls evaluation completed    Functional Status Survey:    Vitals:   03/16/23 0952  BP: 121/61  Pulse: 66  Resp: 16  Temp: (!) 97.4 F (36.3 C)  SpO2: 99%  Weight: 189 lb 3.2 oz (85.8 kg)  Height: 5\' 6"  (1.676 m)   Body mass index is 30.54 kg/m. Physical Exam Vitals and nursing note reviewed.  Constitutional:      Appearance: Normal appearance.  HENT:     Head: Normocephalic and atraumatic.     Nose: Nose normal.     Mouth/Throat:     Mouth: Mucous membranes are moist.  Eyes:      Extraocular Movements: Extraocular movements intact.     Conjunctiva/sclera: Conjunctivae normal.     Pupils: Pupils are equal, round, and reactive to light.  Cardiovascular:     Rate and Rhythm: Normal rate. Rhythm irregular.     Heart sounds: No murmur heard. Pulmonary:     Effort: Pulmonary effort is normal.     Breath sounds: No rales.  Abdominal:     General: Bowel sounds are normal.     Palpations: Abdomen is soft.     Tenderness: There is no abdominal tenderness.  Musculoskeletal:        General: Tenderness present.     Cervical back: Normal range of motion and neck supple.     Right lower leg: Edema present.     Left lower leg: Edema present.     Comments: Minimal edema BLE. Shoulders pain with above shoulder level abduction ROM, worsens at night, R sciatica pain worse in am/getting out of bed, chronic left knee pain  Skin:    General: Skin is warm and dry.     Comments: The right 5th toe nail yellow thick, no trauma/injury, no redness or warmth of the toe.   Neurological:     General: No focal deficit present.     Mental Status: He is alert and oriented to person, place, and time. Mental status is at baseline.     Motor: No weakness.     Gait: Gait abnormal.  Psychiatric:        Mood and Affect: Mood normal.  Behavior: Behavior normal.        Thought Content: Thought content normal.     Labs reviewed: Recent Labs    07/03/22 0816 07/04/22 1101 07/05/22 0309 07/06/22 0406 08/04/22 0000 12/24/22 0000 02/09/23 0000 02/18/23 0000  NA 134* 135 134* 135   < > 139 138 135*  K 4.4 4.1 4.2 3.8   < > 4.4 4.8 4.7  CL 102 106 103 105   < > 107 105 102  CO2 23 21* 21* 22   < > 22 25* 26*  GLUCOSE 92 113* 90 88  --   --   --   --   BUN 13 15 15 15    < > 23* 22* 24*  CREATININE 1.20 1.14 1.19 1.11   < > 1.1 1.2 1.1  CALCIUM 9.6 9.1 9.1 9.1   < > 10.1 10.7 10.5  MG 1.9 1.9  --  1.9  --   --   --   --   PHOS 2.8  --   --  2.6  --   --   --   --    < > = values in  this interval not displayed.   Recent Labs    07/04/22 1101 07/05/22 0309 07/06/22 0406 07/16/22 0000 12/24/22 0000 02/09/23 0000 02/18/23 0000  AST 105* 87* 64*   < > 21 21 17   ALT 38 38 38   < > 16 14 9*  ALKPHOS 55 57 58   < > 80 82 77  BILITOT 1.4* 1.5* 1.5*  --   --   --   --   PROT 6.5 6.5 6.4*  --   --   --   --   ALBUMIN 3.3* 3.2* 3.1*   < > 4.2 4.4 3.9   < > = values in this interval not displayed.   Recent Labs    07/01/22 2346 07/03/22 0816 07/04/22 1101 07/16/22 0000 12/24/22 0000 02/09/23 0000 02/18/23 0000  WBC 12.8* 10.7* 8.9   < > 7.5 7.5 6.9  NEUTROABS 11.2*  --   --    < > 5,303.00 5,415.00 4,637.00  HGB 15.7 16.8 15.9   < > 15.0 13.1* 12.9*  HCT 49.0 49.6 46.8   < > 45 39* 39*  MCV 96.5 92.4 91.9  --   --   --   --   PLT 207 171 187   < > 238 287 247   < > = values in this interval not displayed.   Lab Results  Component Value Date   TSH 1.42 02/09/2023   Lab Results  Component Value Date   HGBA1C  08/24/2009    5.7 (NOTE) The ADA recommends the following therapeutic goal for glycemic control related to Hgb A1c measurement: Goal of therapy: <6.5 Hgb A1c  Reference: American Diabetes Association: Clinical Practice Recommendations 2010, Diabetes Care, 2010, 33: (Suppl  1).   Lab Results  Component Value Date   CHOL 116 12/03/2022   HDL 46 12/03/2022   LDLCALC 54 12/03/2022   LDLDIRECT 55 09/02/2020   TRIG 80 12/03/2022   CHOLHDL 3.3 09/02/2020    Significant Diagnostic Results in last 30 days:  No results found.  Assessment/Plan Cognitive impairment SNF [facility] for supportive care, 07/16/22 MMSE 18/30, taking Deonepezil sine 8/21/2 by Psych  History of stroke no focal weakness residual.   Atrial fibrillation, persistent (HCC) Heart rate is in control, on Diltiazem, Eliquis  Bipolar disorder (HCC) followed by psychiatry, on  Lithium, TSH 1.42 02/09/23, 07/16/22 Lithium level 0.6(0.6-1.2)  Insomnia takes Trazodone since 02/17/23 by  psych, still c/o difficulty falling asleep at night, but hesitant to take sleeping pills.   Chronic CHF (HCC) EF 45-50% 11/2021, not on diuretics, trace edema BLE  Gait instability uses w/c for mobility mostly.   Hyponatremia Na 135 02/18/23  Hyperbilirubinemia bilirubin 1.0 02/18/23  Polycythemia Hgb 12.9 02/18/23  Osteoarthritis, multiple sites  shoulders pain, worsens at night, limited abduction ROM over shoulder level, hx of neck clicks with ROM, aches, c/o right sciatica pain, worsens in am, chronic left knee pain,  Norco is effective.   Osteoporosis 12/01/22 dc'd Alendronate tscore -2.9 2017, -0.9 01/19/19, refused DEXA 11/02/22      Family/ staff Communication: plan of care reviewed with the patient   Labs/tests ordered:  none  Time spend 35 minutes.

## 2023-03-16 NOTE — Assessment & Plan Note (Signed)
Na 135 02/18/23

## 2023-03-16 NOTE — Assessment & Plan Note (Signed)
shoulders pain, worsens at night, limited abduction ROM over shoulder level, hx of neck clicks with ROM, aches, c/o right sciatica pain, worsens in am, chronic left knee pain,  Norco is effective.

## 2023-03-16 NOTE — Assessment & Plan Note (Signed)
SNF [facility] for supportive care, 07/16/22 MMSE 18/30, taking Deonepezil sine 8/21/2 by Merilyn Baba

## 2023-03-16 NOTE — Assessment & Plan Note (Signed)
takes Trazodone since 02/17/23 by psych, still c/o difficulty falling asleep at night, but hesitant to take sleeping pills.

## 2023-03-18 DIAGNOSIS — M6281 Muscle weakness (generalized): Secondary | ICD-10-CM | POA: Diagnosis not present

## 2023-03-22 DIAGNOSIS — M6281 Muscle weakness (generalized): Secondary | ICD-10-CM | POA: Diagnosis not present

## 2023-03-23 DIAGNOSIS — M6281 Muscle weakness (generalized): Secondary | ICD-10-CM | POA: Diagnosis not present

## 2023-03-25 DIAGNOSIS — M6281 Muscle weakness (generalized): Secondary | ICD-10-CM | POA: Diagnosis not present

## 2023-03-29 DIAGNOSIS — M6281 Muscle weakness (generalized): Secondary | ICD-10-CM | POA: Diagnosis not present

## 2023-03-31 DIAGNOSIS — R2681 Unsteadiness on feet: Secondary | ICD-10-CM | POA: Diagnosis not present

## 2023-03-31 DIAGNOSIS — M25671 Stiffness of right ankle, not elsewhere classified: Secondary | ICD-10-CM | POA: Diagnosis not present

## 2023-03-31 DIAGNOSIS — M6281 Muscle weakness (generalized): Secondary | ICD-10-CM | POA: Diagnosis not present

## 2023-04-02 DIAGNOSIS — M25671 Stiffness of right ankle, not elsewhere classified: Secondary | ICD-10-CM | POA: Diagnosis not present

## 2023-04-02 DIAGNOSIS — R2681 Unsteadiness on feet: Secondary | ICD-10-CM | POA: Diagnosis not present

## 2023-04-02 DIAGNOSIS — M6281 Muscle weakness (generalized): Secondary | ICD-10-CM | POA: Diagnosis not present

## 2023-04-05 DIAGNOSIS — K080 Exfoliation of teeth due to systemic causes: Secondary | ICD-10-CM | POA: Diagnosis not present

## 2023-04-05 DIAGNOSIS — M25671 Stiffness of right ankle, not elsewhere classified: Secondary | ICD-10-CM | POA: Diagnosis not present

## 2023-04-05 DIAGNOSIS — M6281 Muscle weakness (generalized): Secondary | ICD-10-CM | POA: Diagnosis not present

## 2023-04-05 DIAGNOSIS — R2681 Unsteadiness on feet: Secondary | ICD-10-CM | POA: Diagnosis not present

## 2023-04-06 DIAGNOSIS — M6281 Muscle weakness (generalized): Secondary | ICD-10-CM | POA: Diagnosis not present

## 2023-04-06 DIAGNOSIS — R2681 Unsteadiness on feet: Secondary | ICD-10-CM | POA: Diagnosis not present

## 2023-04-06 DIAGNOSIS — M25671 Stiffness of right ankle, not elsewhere classified: Secondary | ICD-10-CM | POA: Diagnosis not present

## 2023-04-07 DIAGNOSIS — M25671 Stiffness of right ankle, not elsewhere classified: Secondary | ICD-10-CM | POA: Diagnosis not present

## 2023-04-07 DIAGNOSIS — R2681 Unsteadiness on feet: Secondary | ICD-10-CM | POA: Diagnosis not present

## 2023-04-07 DIAGNOSIS — M6281 Muscle weakness (generalized): Secondary | ICD-10-CM | POA: Diagnosis not present

## 2023-04-08 DIAGNOSIS — M6281 Muscle weakness (generalized): Secondary | ICD-10-CM | POA: Diagnosis not present

## 2023-04-08 DIAGNOSIS — M25671 Stiffness of right ankle, not elsewhere classified: Secondary | ICD-10-CM | POA: Diagnosis not present

## 2023-04-08 DIAGNOSIS — R2681 Unsteadiness on feet: Secondary | ICD-10-CM | POA: Diagnosis not present

## 2023-04-09 ENCOUNTER — Encounter: Payer: Self-pay | Admitting: Sports Medicine

## 2023-04-09 ENCOUNTER — Non-Acute Institutional Stay (SKILLED_NURSING_FACILITY): Payer: Medicare Other | Admitting: Sports Medicine

## 2023-04-09 DIAGNOSIS — M150 Primary generalized (osteo)arthritis: Secondary | ICD-10-CM

## 2023-04-09 DIAGNOSIS — I4819 Other persistent atrial fibrillation: Secondary | ICD-10-CM

## 2023-04-09 DIAGNOSIS — F5101 Primary insomnia: Secondary | ICD-10-CM | POA: Diagnosis not present

## 2023-04-09 DIAGNOSIS — F319 Bipolar disorder, unspecified: Secondary | ICD-10-CM

## 2023-04-09 DIAGNOSIS — H9190 Unspecified hearing loss, unspecified ear: Secondary | ICD-10-CM

## 2023-04-09 DIAGNOSIS — M6281 Muscle weakness (generalized): Secondary | ICD-10-CM | POA: Diagnosis not present

## 2023-04-09 DIAGNOSIS — R2681 Unsteadiness on feet: Secondary | ICD-10-CM | POA: Diagnosis not present

## 2023-04-09 DIAGNOSIS — M25671 Stiffness of right ankle, not elsewhere classified: Secondary | ICD-10-CM | POA: Diagnosis not present

## 2023-04-09 NOTE — Progress Notes (Signed)
Location:   [facility] Room Number: [id number] Place of Service:  SNF [phone]) Provider:  Oletta Lamas   PCP: Moshe Cipro, FNP  Patient Care Team: Moshe Cipro, FNP as PCP - General (Family Medicine) Georgeanna Lea, MD as PCP - Cardiology (Cardiology)  Extended Emergency Contact Information Primary Emergency Contact: Pheasant,[name] Address: [phone].           [facility] [address]          [place], [place] [address] [name] of [place] Home Phone: [phone] Mobile Phone: [phone] Relation: Spouse Secondary Emergency Contact: [name] Address: [address].          [place], [place] [address] [place] Home Phone: [phone] Mobile Phone: [phone] Relation: Son  Code Status:  DNR Goals of care: Advanced Directive information    04/09/2023    3:02 PM  Advanced Directives  Does Patient Have a Medical Advance Directive? Yes  Type of Estate agent of [place];Out of facility DNR (pink MOST or yellow form)  Does patient want to make changes to medical advance directive? No - Patient declined  Copy of Healthcare Power of Attorney in Chart? Yes - validated most recent copy scanned in chart (See row information)     Chief Complaint  Patient presents with   Medical Management of Chronic Issues    Routine Visit.     HPI:  Pt is a 89 y.o. male seen today for medical management of chronic diseases.   Pt seen and examined in his room.  He c/o chronic Rt shoulder pain , reports difficulty lifting his shoulder up. States he cannot transfer from bed to chair due to pain. He is currently on norco.  He is doing physical therapy three times a week and does exercises in his room remaining days.  He declined for steroid injections.   Hearing impairment  Pt states that he does not hear well and requesting to have hearing aid    Insomnia States he is sleeping well  On  trazodone  Bipolar Followed by behavioral health  Recently evaluated by them on lithium   Afib  On cardizem, eliquis  No signs of bleeding      Past Medical History:  Diagnosis Date   AF (paroxysmal atrial fibrillation) (HCC) 02/16/2018   Atrial fibrillation (HCC)    Atrial fibrillation with RVR (HCC) 02/16/2018   Bipolar 1 disorder (HCC)    Bipolar disorder (HCC) 02/17/2018   Dizziness 02/26/2020   DJD (degenerative joint disease)    Dyslipidemia 02/26/2020   Gait instability 02/16/2018   Hypertension    Mitral regurgitation    mild    Polycythemia 02/16/2018   Renal insufficiency    Stroke [facility])    Tremor 02/16/2018   Tremors of nervous system 01/2018   Past Surgical History:  Procedure Laterality Date   SHOULDER SURGERY      Allergies  Allergen Reactions   Lipitor [Atorvastatin] Other (See Comments)    Joint soreness    Allergies as of 04/09/2023       Reactions   Lipitor [atorvastatin] Other (See Comments)   Joint soreness        Medication List        Accurate as of April 09, 2023  3:02 PM. If you have any questions, ask your nurse or doctor.          STOP taking these medications    acetaminophen 500 MG tablet  Commonly known as: TYLENOL Stopped by: Venita Sheffield   diltiazem 120 MG 24 hr capsule Commonly known as: CARDIZEM CD Stopped by: Hussein Macdougal   VITAMIN C PO Stopped by: Shreya Lacasse       TAKE these medications    ascorbic acid 500 MG tablet Commonly known as: VITAMIN C Take 500 mg by mouth daily.   Calcium 600+D Plus Minerals 600-400 MG-UNIT Chew Chew 1 Piece by mouth in the morning and at bedtime.   diltiazem 120 MG 12 hr capsule Commonly known as: CARDIZEM SR Take 120 mg by mouth daily at 12 noon.   donepezil 10 MG tablet Commonly known as: ARICEPT Take 10 mg by mouth at bedtime. What changed: Another medication with the same name was removed. Continue taking this medication, and follow the  directions you see here. Changed by: Alaysia Lightle   Eliquis 5 MG Tabs tablet Generic drug: apixaban TAKE 1 TABLET BY MOUTH TWICE DAILY.   HYDROcodone-acetaminophen 5-325 MG tablet Commonly known as: NORCO/VICODIN Take 1 tablet by mouth in the morning and at bedtime.   latanoprost 0.005 % ophthalmic solution Commonly known as: XALATAN Place 1 drop into both eyes at bedtime.   lithium carbonate 300 MG ER tablet Commonly known as: LITHOBID Take 1 tablet (300 mg total) by mouth every evening.   multivitamin with minerals Tabs tablet Take 1 tablet by mouth daily.   pravastatin 20 MG tablet Commonly known as: PRAVACHOL Take 20 mg by mouth at bedtime. What changed: Another medication with the same name was removed. Continue taking this medication, and follow the directions you see here. Changed by: Venita Sheffield   sodium chloride 5 % ophthalmic solution Commonly known as: MURO 128 Place 1 drop into both eyes at bedtime.   Systane 0.4-0.3 % Soln Generic drug: Polyethyl Glycol-Propyl Glycol Apply 1 drop to eye every 4 (four) hours as needed (dry eyes).   timolol 0.5 % ophthalmic solution Commonly known as: TIMOPTIC Place 1 drop into both eyes daily.   traZODone 50 MG tablet Commonly known as: DESYREL Take 1 tablet (50 mg total) by mouth at bedtime.        Review of Systems  Constitutional:  Negative for chills and fever.  HENT:  Positive for hearing loss.   Respiratory:  Negative for cough, shortness of breath and wheezing.   Cardiovascular:  Negative for chest pain, palpitations and leg swelling.  Gastrointestinal:  Negative for abdominal distention, abdominal pain, blood in stool, constipation, diarrhea, nausea and vomiting.  Genitourinary:  Negative for dysuria, frequency and urgency.  Musculoskeletal:  Positive for arthralgias.  Neurological:  Negative for dizziness, weakness and numbness.  Psychiatric/Behavioral:  Positive for sleep disturbance.  Negative for confusion.     Immunization History  Administered Date(s) Administered   Hepatitis B, ADULT 11/26/1997, 12/27/1997, 05/08/1998   Influenza, High Dose Seasonal PF 03/31/2023   Influenza-Unspecified 04/15/2022   PPD Test 07/08/2022   Pneumococcal Conjugate-13 04/26/2014   Pneumococcal Polysaccharide-23 04/14/2005   Td 08/04/2022   Tdap 05/18/2016   Zoster Recombinant(Shingrix) 07/29/2022, 10/28/2022   Pertinent  Health Maintenance Due  Topic Date Due   INFLUENZA VACCINE  Completed      07/08/2022    9:22 AM 07/10/2022   11:13 AM 08/03/2022    4:30 PM 08/25/2022   11:49 AM 09/24/2022    1:55 PM  Fall Risk  Falls in the past year?  1 1 1  0  Was there an injury with Fall?  1 1 0 0  Fall Risk Category Calculator  3 3 1  0  Fall Risk Category (Retired)  High     (RETIRED) Patient Fall Risk Level High fall risk High fall risk     Patient at Risk for Falls Due to  History of fall(s);Impaired balance/gait;Impaired mobility History of fall(s);Impaired balance/gait;Impaired mobility No Fall Risks   Fall risk Follow up  Falls evaluation completed Falls evaluation completed Falls evaluation completed    Functional Status Survey:    Vitals:   04/09/23 1456  BP: (!) 106/56  Pulse: 66  Resp: 16  Temp: 97.8 F (36.6 C)  SpO2: 97%  Weight: 187 lb 9.6 oz (85.1 kg)  Height: 5\' 6"  (1.676 m)   Body mass index is 30.28 kg/m. Physical Exam Constitutional:      Appearance: Normal appearance.  HENT:     Head: Normocephalic and atraumatic.  Cardiovascular:     Rate and Rhythm: Normal rate. Rhythm irregular.     Pulses: Normal pulses.     Heart sounds: Normal heart sounds.  Pulmonary:     Effort: No respiratory distress.     Breath sounds: No stridor. No wheezing or rales.  Abdominal:     General: Bowel sounds are normal. There is no distension.     Palpations: Abdomen is soft.     Tenderness: There is no abdominal tenderness. There is no right CVA tenderness or guarding.   Musculoskeletal:        General: Swelling present.     Comments: 1+ pitting odema  Neurological:     Mental Status: He is alert. Mental status is at baseline.     Sensory: No sensory deficit.     Labs reviewed: Recent Labs    07/03/22 0816 07/04/22 1101 07/05/22 0309 07/06/22 0406 08/04/22 0000 12/24/22 0000 02/09/23 0000 02/18/23 0000  NA 134* 135 134* 135   < > 139 138 135*  K 4.4 4.1 4.2 3.8   < > 4.4 4.8 4.7  CL 102 106 103 105   < > 107 105 102  CO2 23 21* 21* 22   < > 22 25* 26*  GLUCOSE 92 113* 90 88  --   --   --   --   BUN 13 15 15 15    < > 23* 22* 24*  CREATININE 1.20 1.14 1.19 1.11   < > 1.1 1.2 1.1  CALCIUM 9.6 9.1 9.1 9.1   < > 10.1 10.7 10.5  MG 1.9 1.9  --  1.9  --   --   --   --   PHOS 2.8  --   --  2.6  --   --   --   --    < > = values in this interval not displayed.   Recent Labs    07/04/22 1101 07/05/22 0309 07/06/22 0406 07/16/22 0000 12/24/22 0000 02/09/23 0000 02/18/23 0000  AST 105* 87* 64*   < > 21 21 17   ALT 38 38 38   < > 16 14 9*  ALKPHOS 55 57 58   < > 80 82 77  BILITOT 1.4* 1.5* 1.5*  --   --   --   --   PROT 6.5 6.5 6.4*  --   --   --   --   ALBUMIN 3.3* 3.2* 3.1*   < > 4.2 4.4 3.9   < > = values in this interval not displayed.   Recent Labs    07/01/22 2346 07/03/22 0816 07/04/22  1101 07/16/22 0000 12/24/22 0000 02/09/23 0000 02/18/23 0000  WBC 12.8* 10.7* 8.9   < > 7.5 7.5 6.9  NEUTROABS 11.2*  --   --    < > 5,303.00 5,415.00 4,637.00  HGB 15.7 16.8 15.9   < > 15.0 13.1* 12.9*  HCT 49.0 49.6 46.8   < > 45 39* 39*  MCV 96.5 92.4 91.9  --   --   --   --   PLT 207 171 187   < > 238 287 247   < > = values in this interval not displayed.   Lab Results  Component Value Date   TSH 1.42 02/09/2023   Lab Results  Component Value Date   HGBA1C  08/24/2009    5.7 (NOTE) The ADA recommends the following therapeutic goal for glycemic control related to Hgb A1c measurement: Goal of therapy: <6.5 Hgb A1c  Reference:  American Diabetes Association: Clinical Practice Recommendations 2010, Diabetes Care, 2010, 33: (Suppl  1).   Lab Results  Component Value Date   CHOL 116 12/03/2022   HDL 46 12/03/2022   LDLCALC 54 12/03/2022   LDLDIRECT 55 09/02/2020   TRIG 80 12/03/2022   CHOLHDL 3.3 09/02/2020    Significant Diagnostic Results in last 30 days:  No results found.  Assessment/Plan  1. Hearing loss, unspecified hearing loss type, unspecified laterality Will refer to audiology for hearing test   2. Atrial fibrillation, persistent (HCC) Rate controlled Pt noted with lower extremity swelling  Will change cardizem to metoprolol  Will monitor vitals daily for a week   3. Bipolar affective disorder, remission status unspecified (HCC) Stable  Cont with lithium  Follow up with psychiatry  4. Primary insomnia Discussed with sleep hygiene Cont with trazodone  5. Primary osteoarthritis involving multiple joints Pt declined steroid injections Cont with physical therapy  Cont with norco   Other orders - Calcium Carbonate-Vit D-Min (CALCIUM 600+D PLUS MINERALS) 600-400 MG-UNIT CHEW; Chew 1 Piece by mouth in the morning and at bedtime. - diltiazem (CARDIZEM SR) 120 MG 12 hr capsule; Take 120 mg by mouth daily at 12 noon. - pravastatin (PRAVACHOL) 20 MG tablet; Take 20 mg by mouth at bedtime.    Family/ staff Communication: care plan discussed with the nursing staff  Labs/tests ordered:  none    I spent greater than 30 minutes for the care of this patient in face to face time, chart review, clinical documentation, patient education.

## 2023-04-12 ENCOUNTER — Encounter: Payer: Self-pay | Admitting: Sports Medicine

## 2023-04-12 DIAGNOSIS — R2681 Unsteadiness on feet: Secondary | ICD-10-CM | POA: Diagnosis not present

## 2023-04-12 DIAGNOSIS — M25671 Stiffness of right ankle, not elsewhere classified: Secondary | ICD-10-CM | POA: Diagnosis not present

## 2023-04-12 DIAGNOSIS — M6281 Muscle weakness (generalized): Secondary | ICD-10-CM | POA: Diagnosis not present

## 2023-04-13 ENCOUNTER — Other Ambulatory Visit: Payer: Self-pay | Admitting: Adult Health

## 2023-04-13 DIAGNOSIS — M6281 Muscle weakness (generalized): Secondary | ICD-10-CM | POA: Diagnosis not present

## 2023-04-13 DIAGNOSIS — M25671 Stiffness of right ankle, not elsewhere classified: Secondary | ICD-10-CM | POA: Diagnosis not present

## 2023-04-13 DIAGNOSIS — R2681 Unsteadiness on feet: Secondary | ICD-10-CM | POA: Diagnosis not present

## 2023-04-13 DIAGNOSIS — M150 Primary generalized (osteo)arthritis: Secondary | ICD-10-CM

## 2023-04-13 MED ORDER — HYDROCODONE-ACETAMINOPHEN 5-325 MG PO TABS: 0.5000 | ORAL_TABLET | Freq: Two times a day (BID) | ORAL | 0 refills | Status: DC

## 2023-04-14 DIAGNOSIS — M25671 Stiffness of right ankle, not elsewhere classified: Secondary | ICD-10-CM | POA: Diagnosis not present

## 2023-04-14 DIAGNOSIS — R2681 Unsteadiness on feet: Secondary | ICD-10-CM | POA: Diagnosis not present

## 2023-04-14 DIAGNOSIS — M6281 Muscle weakness (generalized): Secondary | ICD-10-CM | POA: Diagnosis not present

## 2023-04-15 DIAGNOSIS — M25671 Stiffness of right ankle, not elsewhere classified: Secondary | ICD-10-CM | POA: Diagnosis not present

## 2023-04-15 DIAGNOSIS — M6281 Muscle weakness (generalized): Secondary | ICD-10-CM | POA: Diagnosis not present

## 2023-04-15 DIAGNOSIS — R2681 Unsteadiness on feet: Secondary | ICD-10-CM | POA: Diagnosis not present

## 2023-04-16 DIAGNOSIS — M25671 Stiffness of right ankle, not elsewhere classified: Secondary | ICD-10-CM | POA: Diagnosis not present

## 2023-04-16 DIAGNOSIS — M6281 Muscle weakness (generalized): Secondary | ICD-10-CM | POA: Diagnosis not present

## 2023-04-16 DIAGNOSIS — R2681 Unsteadiness on feet: Secondary | ICD-10-CM | POA: Diagnosis not present

## 2023-04-19 DIAGNOSIS — M6281 Muscle weakness (generalized): Secondary | ICD-10-CM | POA: Diagnosis not present

## 2023-04-19 DIAGNOSIS — M25671 Stiffness of right ankle, not elsewhere classified: Secondary | ICD-10-CM | POA: Diagnosis not present

## 2023-04-19 DIAGNOSIS — R2681 Unsteadiness on feet: Secondary | ICD-10-CM | POA: Diagnosis not present

## 2023-04-21 DIAGNOSIS — M25671 Stiffness of right ankle, not elsewhere classified: Secondary | ICD-10-CM | POA: Diagnosis not present

## 2023-04-21 DIAGNOSIS — R2681 Unsteadiness on feet: Secondary | ICD-10-CM | POA: Diagnosis not present

## 2023-04-21 DIAGNOSIS — M6281 Muscle weakness (generalized): Secondary | ICD-10-CM | POA: Diagnosis not present

## 2023-04-22 DIAGNOSIS — M25671 Stiffness of right ankle, not elsewhere classified: Secondary | ICD-10-CM | POA: Diagnosis not present

## 2023-04-22 DIAGNOSIS — R2681 Unsteadiness on feet: Secondary | ICD-10-CM | POA: Diagnosis not present

## 2023-04-22 DIAGNOSIS — M6281 Muscle weakness (generalized): Secondary | ICD-10-CM | POA: Diagnosis not present

## 2023-04-23 DIAGNOSIS — M25671 Stiffness of right ankle, not elsewhere classified: Secondary | ICD-10-CM | POA: Diagnosis not present

## 2023-04-23 DIAGNOSIS — M6281 Muscle weakness (generalized): Secondary | ICD-10-CM | POA: Diagnosis not present

## 2023-04-23 DIAGNOSIS — R2681 Unsteadiness on feet: Secondary | ICD-10-CM | POA: Diagnosis not present

## 2023-04-26 DIAGNOSIS — R2681 Unsteadiness on feet: Secondary | ICD-10-CM | POA: Diagnosis not present

## 2023-04-26 DIAGNOSIS — M6281 Muscle weakness (generalized): Secondary | ICD-10-CM | POA: Diagnosis not present

## 2023-04-26 DIAGNOSIS — M25671 Stiffness of right ankle, not elsewhere classified: Secondary | ICD-10-CM | POA: Diagnosis not present

## 2023-04-27 DIAGNOSIS — B351 Tinea unguium: Secondary | ICD-10-CM | POA: Diagnosis not present

## 2023-04-27 DIAGNOSIS — M79672 Pain in left foot: Secondary | ICD-10-CM | POA: Diagnosis not present

## 2023-04-27 DIAGNOSIS — M79671 Pain in right foot: Secondary | ICD-10-CM | POA: Diagnosis not present

## 2023-04-28 DIAGNOSIS — M6281 Muscle weakness (generalized): Secondary | ICD-10-CM | POA: Diagnosis not present

## 2023-04-28 DIAGNOSIS — M25671 Stiffness of right ankle, not elsewhere classified: Secondary | ICD-10-CM | POA: Diagnosis not present

## 2023-04-28 DIAGNOSIS — R2681 Unsteadiness on feet: Secondary | ICD-10-CM | POA: Diagnosis not present

## 2023-04-29 DIAGNOSIS — R2681 Unsteadiness on feet: Secondary | ICD-10-CM | POA: Diagnosis not present

## 2023-04-29 DIAGNOSIS — M6281 Muscle weakness (generalized): Secondary | ICD-10-CM | POA: Diagnosis not present

## 2023-04-29 DIAGNOSIS — M25671 Stiffness of right ankle, not elsewhere classified: Secondary | ICD-10-CM | POA: Diagnosis not present

## 2023-04-30 DIAGNOSIS — R2681 Unsteadiness on feet: Secondary | ICD-10-CM | POA: Diagnosis not present

## 2023-04-30 DIAGNOSIS — M6281 Muscle weakness (generalized): Secondary | ICD-10-CM | POA: Diagnosis not present

## 2023-04-30 DIAGNOSIS — M25671 Stiffness of right ankle, not elsewhere classified: Secondary | ICD-10-CM | POA: Diagnosis not present

## 2023-05-03 ENCOUNTER — Ambulatory Visit: Payer: Medicare Other | Admitting: Psychiatry

## 2023-05-03 ENCOUNTER — Encounter: Payer: Self-pay | Admitting: Sports Medicine

## 2023-05-03 DIAGNOSIS — F5105 Insomnia due to other mental disorder: Secondary | ICD-10-CM

## 2023-05-03 DIAGNOSIS — Z79899 Other long term (current) drug therapy: Secondary | ICD-10-CM

## 2023-05-03 DIAGNOSIS — G309 Alzheimer's disease, unspecified: Secondary | ICD-10-CM

## 2023-05-03 DIAGNOSIS — R2681 Unsteadiness on feet: Secondary | ICD-10-CM | POA: Diagnosis not present

## 2023-05-03 DIAGNOSIS — F319 Bipolar disorder, unspecified: Secondary | ICD-10-CM | POA: Diagnosis not present

## 2023-05-03 DIAGNOSIS — M6281 Muscle weakness (generalized): Secondary | ICD-10-CM | POA: Diagnosis not present

## 2023-05-03 DIAGNOSIS — M25671 Stiffness of right ankle, not elsewhere classified: Secondary | ICD-10-CM | POA: Diagnosis not present

## 2023-05-03 DIAGNOSIS — F0280 Dementia in other diseases classified elsewhere without behavioral disturbance: Secondary | ICD-10-CM

## 2023-05-03 NOTE — Progress Notes (Signed)
Sean Bishop [id number] 1932/10/09 89 y.o.   Subjective:   Patient ID:  Sean Bishop is a 89 y.o. (DOB February 15, 1933) male.  Chief Complaint:  No chief complaint on file.   HPI Sean Bishop presents for follow-up of bipolar disorder.  seen August 2020.  He was having balance problems and lithium was reduced from 600 mg daily to 450 mg daily.  He was encouraged to get a lithium level as soon as possible.  08/02/2019 appointment with the following noted and without med changes. Overall doing very well.  Handling lockdown pretty well.  Couldn't go much of anywhere from the facility.  Missed socialization.  They are staying in [facility]  Not a bad facility.  No complaints with it.   No sig exercise.  Some walking in the building. Got both Covid vaccines.  Son Engineer, civil (consulting) in [place]. No sig tremor.  Some walking problems.  12/23/2020 appointment with the following noted:  seen with wife Health problems. On lithium 450 mg daily. Has had physical problems.  Using walker.   W thinks he's doing OK.  However she notes that he is memory has declined.  Sometimes he "remembers" things in a way that is different from the way they actually happened.  However he has not had any unusual mood swings. PCP says he cannot drive anymore bc neuropathy and physical problems. Plan: get lithium level  06/26/21 appt noted: Had trouble getting the lithium level.   Hip pain and needs walker.   No SE noted with lithium. Plan: no changes  12/24/21 appt noted: [facility].  [name] doing well. Doing well.  No unusual problems. Lithium 450 mg HS Not driving. No med swings since reduction in lithium. Patient reports stable mood and denies depressed or irritable moods.  Patient denies any recent difficulty with anxiety.  Patient denies difficulty with sleep initiation or maintenance. Denies appetite disturbance.  Patient reports that energy and motivation have been good.  Patient reports some  difficulty with concentration and memory.  Patient denies any suicidal ideation.  Aware of cognitive problems.   06/25/22 appt noted: [facility].  [name] doing well. Doing well.  No unusual problems. Lithium 450 mg HS Not driving. Requiring walker.   Admits forgetfulness.  No mood swings. Some health problems. No mood problems with mood or anxiety.  No psychosis.  No tremor..  02/17/23 appt noted:  seen with wife [name]. Losing memory.   Moved to [facility].   Meds reduced to lithium CR 300 mg HS Complaining of trouble going to sleep.   Some times of dep but not extended.   Wife says he gets a bit obsessive on things he has to do.  He acknowledges.  Can ruminate over health concerns.  Upset that he can't be with wife.   He's having some problems dealing with some things from the past per wife.  Hard for her to deal with it.  Tolerating meds. Plan: Donepezil 5 mg daily for a month, then 10 mg daily  Insomnia is causing clinical problems, reasonable trial trazodone 50 mg HS.  05/03/23 appt noted:  seen with wife Talks about staying at [facility].  Had a major problem a the facility.  Got upset they didn't give him pain meds for shoulder and sleep meds.  They stopped without telling him anything about it.  But got them resumed.   Not entirely happy with the location.   Psych meds:  donepezil 10, lithium 300 HS, getting Norco 5 0.5 tablet BID, trazodone 50 HS Sleep has improved.  Was really bad but is better now. Goes to bed and dressed for bed at 8.  Is not tired or sleepy but doesn't want to watch TV.  They get him up at 7 .   [employer] and watches the games.  Knows the hockey schedule.    SE some hangover but OK after breakfast. W doesn't visit daily.   Exercises. Shoulder pain.  Wife [name]. Adopted kids [name] nurse doing well;  [name] was murdered or suicide.  PCP [name] advised against driving bc history of  syncope.  Past Psychiatric Medication Trials:  Decades of Lithium,  Lorazepam  Review of Systems:  Review of Systems  Constitutional:  Negative for fatigue.  Musculoskeletal:  Positive for arthralgias, back pain and gait problem.  Neurological:  Positive for weakness. Negative for tremors and syncope.  Psychiatric/Behavioral:  Positive for decreased concentration. Negative for agitation, behavioral problems, confusion, dysphoric mood, hallucinations, self-injury, sleep disturbance and suicidal ideas. The patient is not nervous/anxious and is not hyperactive.     Medications: I have reviewed the patient's current medications.  Current Outpatient Medications  Medication Sig Dispense Refill   ascorbic acid (VITAMIN C) 500 MG tablet Take 500 mg by mouth daily.     Calcium Carbonate-Vit D-Min (CALCIUM 600+D PLUS MINERALS) 600-400 MG-UNIT CHEW Chew 1 Piece by mouth in the morning and at bedtime.     diltiazem (CARDIZEM SR) 120 MG 12 hr capsule Take 120 mg by mouth daily at 12 noon.     donepezil (ARICEPT) 10 MG tablet Take 10 mg by mouth at bedtime.     ELIQUIS 5 MG TABS tablet TAKE 1 TABLET BY MOUTH TWICE DAILY. 180 tablet 1   HYDROcodone-acetaminophen (NORCO/VICODIN) 5-325 MG tablet Take 0.5 tablets by mouth in the morning and at bedtime. 30 tablet 0   latanoprost (XALATAN) 0.005 % ophthalmic solution Place 1 drop into both eyes at bedtime.     lithium carbonate (LITHOBID) 300 MG ER tablet Take 1 tablet (300 mg total) by mouth every evening. 90 tablet 1   Multiple Vitamin (MULTIVITAMIN WITH MINERALS) TABS tablet Take 1 tablet by mouth daily.     Polyethyl Glycol-Propyl Glycol (SYSTANE) 0.4-0.3 % SOLN Apply 1 drop to eye every 4 (four) hours as needed (dry eyes).     pravastatin (PRAVACHOL) 20 MG tablet Take 20 mg by mouth at bedtime.     sodium chloride (MURO 128) 5 % ophthalmic solution Place 1 drop into both eyes at bedtime.     timolol (TIMOPTIC) 0.5 % ophthalmic solution Place 1 drop  into both eyes daily.     traZODone (DESYREL) 50 MG tablet Take 1 tablet (50 mg total) by mouth at bedtime. 30 tablet 5   No current facility-administered medications for this visit.    Medication Side Effects: None  Allergies:  Allergies  Allergen Reactions   Lipitor [Atorvastatin] Other (See Comments)    Joint soreness    Past Medical History:  Diagnosis Date   AF (paroxysmal atrial fibrillation) (HCC) 02/16/2018   Atrial fibrillation (HCC)    Atrial fibrillation with RVR (HCC) 02/16/2018   Bipolar 1 disorder (HCC)    Bipolar disorder (HCC) 02/17/2018   Dizziness 02/26/2020   DJD (degenerative joint disease)    Dyslipidemia 02/26/2020   Gait instability 02/16/2018   Hypertension    Mitral regurgitation    mild    Polycythemia 02/16/2018  Renal insufficiency    Stroke [facility])    Tremor 02/16/2018   Tremors of nervous system 01/2018    Family History  Problem Relation Age of Onset   Alcohol abuse Mother    Cancer Father     Social History   Socioeconomic History   Marital status: Married    Spouse name: Not on file   Number of children: Not on file   Years of education: Not on file   Highest education level: Not on file  Occupational History   Not on file  Tobacco Use   Smoking status: Former   Smokeless tobacco: Never  Vaping Use   Vaping status: Never Used  Substance and Sexual Activity   Alcohol use: Yes    Alcohol/week: 1.0 standard drink of alcohol    Types: 1 Glasses of wine per week    Comment: 1 per month   Drug use: No   Sexual activity: Not on file  Other Topics Concern   Not on file  Social History Narrative   Not on file   Social Determinants of Health   Financial Resource Strain: Not on file  Food Insecurity: No Food Insecurity (07/02/2022)   Hunger Vital Sign    Worried About Running Out of Food in the Last Year: Never true    Ran Out of Food in the Last Year: Never true  Transportation Needs: No Transportation Needs (07/02/2022)   PRAPARE  - Administrator, Civil Service (Medical): No    Lack of Transportation (Non-Medical): No  Physical Activity: Not on file  Stress: Not on file  Social Connections: Not on file  Intimate Partner Violence: Not At Risk (07/02/2022)   Humiliation, Afraid, Rape, and Kick questionnaire    Fear of Current or Ex-Partner: No    Emotionally Abused: No    Physically Abused: No    Sexually Abused: No    Past Medical History, Surgical history, Social history, and Family history were reviewed and updated as appropriate.   Please see review of systems for further details on the patient's review from today.   Objective:   Physical Exam:  There were no vitals taken for this visit.  Physical Exam Constitutional:      General: He is not in acute distress.    Appearance: He is well-developed.  Neurological:     Mental Status: He is alert and oriented to person, place, and time. Mental status is at baseline.     Cranial Nerves: No dysarthria.     Motor: Weakness present. No tremor.     Coordination: Coordination abnormal.     Gait: Gait abnormal.     Comments: WC bound  Psychiatric:        Attention and Perception: Attention normal.        Mood and Affect: Mood and affect and mood normal. Mood is not anxious, depressed or elated. Affect is not labile, blunt, tearful or inappropriate.        Speech: Speech normal. Speech is not slurred.        Behavior: Behavior normal. Behavior is cooperative.        Thought Content: Thought content normal. Thought content is not paranoid or delusional. Thought content does not include homicidal or suicidal ideation. Thought content does not include suicidal plan.        Cognition and Memory: Cognition is impaired. Memory is impaired. He exhibits impaired recent memory and impaired remote memory.     Comments: Talkative with  fair focus  Mood good.  No mania. Somewhat difficult to direct but did ask questions. Word finding problems better than last  time.    01/2023 MMSE: 2024, August, not date Immed 3/3 7's= 93 World>DLRLW Recall @2  min=0/3  05/03/23 appt cognition is      Lab Review:     Component Value Date/Time   NA 135 (A) 02/18/2023 0000   K 4.7 02/18/2023 0000   CL 102 02/18/2023 0000   CO2 26 (A) 02/18/2023 0000   GLUCOSE 88 07/06/2022 0406   BUN 24 (A) 02/18/2023 0000   CREATININE 1.1 02/18/2023 0000   CREATININE 1.11 07/06/2022 0406   CALCIUM 10.5 02/18/2023 0000   PROT 6.4 (L) 07/06/2022 0406   PROT 7.4 09/02/2020 1423   ALBUMIN 3.9 02/18/2023 0000   ALBUMIN 4.5 09/02/2020 1423   AST 17 02/18/2023 0000   ALT 9 (A) 02/18/2023 0000   ALKPHOS 77 02/18/2023 0000   BILITOT 1.5 (H) 07/06/2022 0406   BILITOT 1.4 (H) 09/02/2020 1423   GFRNONAA >60 07/06/2022 0406   GFRAA >60 02/17/2018 0129       Component Value Date/Time   WBC 6.9 02/18/2023 0000   WBC 8.9 07/04/2022 1101   RBC 4.12 02/18/2023 0000   HGB 12.9 (A) 02/18/2023 0000   HCT 39 (A) 02/18/2023 0000   PLT 247 02/18/2023 0000   MCV 91.9 07/04/2022 1101   MCH 31.2 07/04/2022 1101   MCHC 34.0 07/04/2022 1101   RDW 13.3 07/04/2022 1101   LYMPHSABS 0.5 (L) 07/01/2022 2346   MONOABS 1.0 07/01/2022 2346   EOSABS 0.0 07/01/2022 2346   BASOSABS 0.0 07/01/2022 2346    Lithium Lvl  Date Value Ref Range Status  07/01/2022 0.47 (L) 0.60 - 1.20 mmol/L Final    Comment:    Performed at [facility], [address] [place], [place] [address]   07/01/22 lithium level 0.47 on 300 mg daily.  No results found for: "PHENYTOIN", "PHENOBARB", "VALPROATE", "CBMZ"   Labs July 07, 2018 incl Li 0.9,  December 14, 2018 = Cr 1.29, Calcium 10.8, CMP normal  Normal lipids and PTH  12/22 Cr 1.34, Calcium 10.4  Cannot see any labs from 2023 in EPIC  .res Assessment: Plan:    There are no diagnoses linked to this encounter.  Supportive therapy dealing with age related problems and health .  No worsening of his health since his last visit. Except his  shoulder pain is worse. .  Not regained full capacity.  Cannot drive anymore DT aging.  Cognition is better with donepezil  .  insomnia is better with trazodone.    But more distressed over NH placment and separated from wife over it.   ? Whether distress is primarily with NHP or progressing dementia.  now in NH and can get lithium levels more.  07/01/22 lithium level 0.47 on 300 mg daily.  Bipolar has been stable for many years.  No mood swings nor depression.  Not severely distressed now.  Counseled patient regarding potential benefits, risks, and side effects of lithium to include potential risk of lithium affecting thyroid and renal function.  Discussed need for periodic lab monitoring to determine drug level and to assess for potential adverse effects.  Counseled patient regarding signs and symptoms of lithium toxicity and advised that they notify office immediately or seek urgent medical attention if experiencing these signs and symptoms.  Patient advised to contact office with any questions or concerns. 07/2022 Cr is better  in normal range.  Discussed the importance of avoiding lithium toxicity and how with aging the body has more difficulty eliminating lithium.  Therefore lithium dosages may need to be decreased as one ages.    They agree reduced lithium to 300 mg daily bc prior problems getting lithium levels but level is borderline and he's more distressed but not manic.  Mild cognitive impairment  progressed to the point of moderate dementia but appears better today than last time.  Knows the doctor and can give recent history.   Disc possible trial of donepezil   Donepezil 10 mg daily   Insomnia is causing clinical problems, reasonable trial trazodone 50 mg HS. Disc SE including fall risk Disc sleep hygiene.  Is in bed 11 hours and not realistic that he will sleep that long.    He is no longer driving and he is agreeable with this plan.  Frustrated he can't get outside more.     This appt was 30 mins.  FU 6 mos  Meredith Staggers, MD, DFAPA   Consider reducing lithium Please see After Visit Summary for patient specific instructions.  No future appointments.     No orders of the defined types were placed in this encounter.      -------------------------------

## 2023-05-04 ENCOUNTER — Non-Acute Institutional Stay (SKILLED_NURSING_FACILITY): Payer: Medicare Other | Admitting: Nurse Practitioner

## 2023-05-04 ENCOUNTER — Encounter: Payer: Self-pay | Admitting: Nurse Practitioner

## 2023-05-04 DIAGNOSIS — H9190 Unspecified hearing loss, unspecified ear: Secondary | ICD-10-CM | POA: Insufficient documentation

## 2023-05-04 DIAGNOSIS — R4189 Other symptoms and signs involving cognitive functions and awareness: Secondary | ICD-10-CM

## 2023-05-04 DIAGNOSIS — I5042 Chronic combined systolic (congestive) and diastolic (congestive) heart failure: Secondary | ICD-10-CM | POA: Diagnosis not present

## 2023-05-04 DIAGNOSIS — I4819 Other persistent atrial fibrillation: Secondary | ICD-10-CM

## 2023-05-04 DIAGNOSIS — R2681 Unsteadiness on feet: Secondary | ICD-10-CM | POA: Diagnosis not present

## 2023-05-04 DIAGNOSIS — N1831 Chronic kidney disease, stage 3a: Secondary | ICD-10-CM

## 2023-05-04 DIAGNOSIS — M25671 Stiffness of right ankle, not elsewhere classified: Secondary | ICD-10-CM | POA: Diagnosis not present

## 2023-05-04 DIAGNOSIS — M6281 Muscle weakness (generalized): Secondary | ICD-10-CM | POA: Diagnosis not present

## 2023-05-04 DIAGNOSIS — M810 Age-related osteoporosis without current pathological fracture: Secondary | ICD-10-CM

## 2023-05-04 DIAGNOSIS — F319 Bipolar disorder, unspecified: Secondary | ICD-10-CM

## 2023-05-04 DIAGNOSIS — F5101 Primary insomnia: Secondary | ICD-10-CM

## 2023-05-04 DIAGNOSIS — M150 Primary generalized (osteo)arthritis: Secondary | ICD-10-CM

## 2023-05-04 NOTE — Progress Notes (Unsigned)
Location:   SNF [facility] Room Number: 18 Place of Service:  SNF (31) Provider: Arna Snipe Jaiana Sheffer NP  Moshe Cipro, FNP  Patient Care Team: Moshe Cipro, FNP as PCP - General (Family Medicine) Georgeanna Lea, MD as PCP - Cardiology (Cardiology)  Extended Emergency Contact Information Primary Emergency Contact: Dolloff,[name] Address: [phone]           [facility] [address]          [place], [place] [address] [name] Home Phone: [phone] Mobile Phone: [phone] Relation: Spouse Secondary Emergency Contact: [name] Address: [address]          [place], [place] [address] [place] Home Phone: [phone] Mobile Phone: [phone] Relation: Son  Code Status:  DNR Goals of care: Advanced Directive information    04/09/2023    3:02 PM  Advanced Directives  Does Patient Have a Medical Advance Directive? Yes  Type of Estate agent of [place];Out of facility DNR (pink MOST or yellow form)  Does patient want to make changes to medical advance directive? No - Patient declined  Copy of Healthcare Power of Attorney in Chart? Yes - validated most recent copy scanned in chart (See row information)     Chief Complaint  Patient presents with  . Medical Management of Chronic Issues    HPI:  Pt is a 89 y.o. male seen today for medical management of chronic diseases.    HOH L>R, HPOA-wife declined hearing aids.   Parkinson's disease, stopped parkinson's medication because of affected his cognition, had resting tremor in left arm/leg in the past             OP 12/01/22 dc'd Alendronate tscore -2.9 2017, -0.9 01/19/19, refused DEXA 11/02/22             OA shoulders pain, worsens at night, limited abduction ROM over shoulder level, hx of neck clicks with ROM, aches, c/o right sciatica pain, worsens in am, chronic left knee pain,  Norco is effective.              Cognitive impairment, SNF [facility]  for supportive care, 07/16/22 MMSE 18/30, taking Deonepezil sine 8/21/2 by Psych             CVA, no focal weakness residual.              Afib, on Diltiazem, Eliquis             Bipolar disorder, followed by psychiatry, on Lithium, TSH 1.42 02/09/23, 07/16/22 Lithium level 0.6(0.6-1.2)             Insomnia, takes Trazodone since 02/17/23 by psych             CHF, EF 45-50% 11/2021, not on diuretics, trace edema BLE             Gait abnormality, uses w/c for mobility mostly.              Hyponatremia, Na 135 02/18/23             Hyperbilirubinemia, bilirubin 1.0 02/18/23            Polycythemia,  Hgb 12.9 02/18/23             CKD, Bun/creat 24/1.1 02/18/23             Hypogonadism, takes testosterone inj             Hyperlipidemia, takes Pravastatin  Glaucoma, eye drops.    Past Medical History:  Diagnosis Date  . AF (paroxysmal atrial fibrillation) (HCC) 02/16/2018  . Atrial fibrillation (HCC)   . Atrial fibrillation with RVR (HCC) 02/16/2018  . Bipolar 1 disorder (HCC)   . Bipolar disorder (HCC) 02/17/2018  . Dizziness 02/26/2020  . DJD (degenerative joint disease)   . Dyslipidemia 02/26/2020  . Gait instability 02/16/2018  . Hypertension   . Mitral regurgitation    mild   . Polycythemia 02/16/2018  . Renal insufficiency   . Stroke (HCC)   . Tremor 02/16/2018  . Tremors of nervous system 01/2018   Past Surgical History:  Procedure Laterality Date  . SHOULDER SURGERY      Allergies  Allergen Reactions  . Lipitor [Atorvastatin] Other (See Comments)    Joint soreness    Allergies as of 05/04/2023       Reactions   Lipitor [atorvastatin] Other (See Comments)   Joint soreness        Medication List        Accurate as of May 04, 2023 11:59 PM. If you have any questions, ask your nurse or doctor.          ascorbic acid 500 MG tablet Commonly known as: VITAMIN C Take 500 mg by mouth daily.   Calcium 600+D Plus Minerals 600-400 MG-UNIT Chew Chew 1 Piece by  mouth in the morning and at bedtime.   diltiazem 120 MG 12 hr capsule Commonly known as: CARDIZEM SR Take 120 mg by mouth daily at 12 noon.   donepezil 10 MG tablet Commonly known as: ARICEPT Take 10 mg by mouth at bedtime.   Eliquis 5 MG Tabs tablet Generic drug: apixaban TAKE 1 TABLET BY MOUTH TWICE DAILY.   HYDROcodone-acetaminophen 5-325 MG tablet Commonly known as: NORCO/VICODIN Take 0.5 tablets by mouth in the morning and at bedtime.   latanoprost 0.005 % ophthalmic solution Commonly known as: XALATAN Place 1 drop into both eyes at bedtime.   lithium carbonate 300 MG ER tablet Commonly known as: LITHOBID Take 1 tablet (300 mg total) by mouth every evening.   multivitamin with minerals Tabs tablet Take 1 tablet by mouth daily.   pravastatin 20 MG tablet Commonly known as: PRAVACHOL Take 20 mg by mouth at bedtime.   sodium chloride 5 % ophthalmic solution Commonly known as: MURO 128 Place 1 drop into both eyes at bedtime.   Systane 0.4-0.3 % Soln Generic drug: Polyethyl Glycol-Propyl Glycol Apply 1 drop to eye every 4 (four) hours as needed (dry eyes).   timolol 0.5 % ophthalmic solution Commonly known as: TIMOPTIC Place 1 drop into both eyes daily.   traZODone 50 MG tablet Commonly known as: DESYREL Take 1 tablet (50 mg total) by mouth at bedtime.        Review of Systems  Constitutional:  Negative for appetite change, fatigue and fever.  HENT:  Positive for hearing loss. Negative for congestion and trouble swallowing.   Eyes:  Negative for visual disturbance.  Respiratory:  Negative for cough and shortness of breath.   Cardiovascular:  Positive for leg swelling.  Gastrointestinal:  Negative for abdominal pain and constipation.  Genitourinary:  Negative for dysuria and urgency.  Musculoskeletal:  Positive for arthralgias, back pain and gait problem.       Shoulders pain with above shoulder level abduction ROM, worsens at night. R sciatic pain, not  new, worse in am when getting up, chronic left knee pain  Skin:  Negative for  color change.  Neurological:  Negative for speech difficulty, weakness and headaches.  Psychiatric/Behavioral:  Positive for confusion. Negative for sleep disturbance. The patient is not nervous/anxious.     Immunization History  Administered Date(s) Administered  . Hepatitis B, ADULT 11/26/1997, 12/27/1997, 05/08/1998  . Influenza, High Dose Seasonal PF 03/31/2023  . Influenza-Unspecified 04/15/2022  . PPD Test 07/08/2022  . Pneumococcal Conjugate-13 04/26/2014  . Pneumococcal Polysaccharide-23 04/14/2005  . Td 08/04/2022  . Tdap 05/18/2016  . Zoster Recombinant(Shingrix) 07/29/2022, 10/28/2022   Pertinent  Health Maintenance Due  Topic Date Due  . INFLUENZA VACCINE  Completed      07/08/2022    9:22 AM 07/10/2022   11:13 AM 08/03/2022    4:30 PM 08/25/2022   11:49 AM 09/24/2022    1:55 PM  Fall Risk  Falls in the past year?  1 1 1  0  Was there an injury with Fall?  1 1 0 0  Fall Risk Category Calculator  3 3 1  0  Fall Risk Category (Retired)  High     (RETIRED) Patient Fall Risk Level High fall risk High fall risk     Patient at Risk for Falls Due to  History of fall(s);Impaired balance/gait;Impaired mobility History of fall(s);Impaired balance/gait;Impaired mobility No Fall Risks   Fall risk Follow up  Falls evaluation completed Falls evaluation completed Falls evaluation completed    Functional Status Survey:    Vitals:   05/04/23 1204  BP: 117/76  Pulse: 68  Resp: 20  Temp: (!) 97.3 F (36.3 C)  SpO2: 95%  Weight: 191 lb 6.4 oz (86.8 kg)   Body mass index is 30.89 kg/m. Physical Exam Vitals and nursing note reviewed.  Constitutional:      Appearance: Normal appearance.  HENT:     Head: Normocephalic and atraumatic.     Nose: Nose normal.     Mouth/Throat:     Mouth: Mucous membranes are moist.  Eyes:     Extraocular Movements: Extraocular movements intact.      Conjunctiva/sclera: Conjunctivae normal.     Pupils: Pupils are equal, round, and reactive to light.  Cardiovascular:     Rate and Rhythm: Normal rate. Rhythm irregular.     Heart sounds: No murmur heard. Pulmonary:     Effort: Pulmonary effort is normal.     Breath sounds: No rales.  Abdominal:     General: Bowel sounds are normal.     Palpations: Abdomen is soft.     Tenderness: There is no abdominal tenderness.  Musculoskeletal:        General: Tenderness present.     Cervical back: Normal range of motion and neck supple.     Right lower leg: Edema present.     Left lower leg: Edema present.     Comments: Minimal edema BLE. Shoulders pain with above shoulder level abduction ROM, worsens at night, R sciatica pain worse in am/getting out of bed, chronic left knee pain  Skin:    General: Skin is warm and dry.     Comments: The right 5th toe nail yellow thick, no trauma/injury, no redness or warmth of the toe.   Neurological:     General: No focal deficit present.     Mental Status: He is alert and oriented to person, place, and time. Mental status is at baseline.     Motor: No weakness.     Gait: Gait abnormal.  Psychiatric:        Mood and Affect: Mood  normal.        Behavior: Behavior normal.        Thought Content: Thought content normal.    Labs reviewed: Recent Labs    07/03/22 0816 07/04/22 1101 07/05/22 0309 07/06/22 0406 08/04/22 0000 12/24/22 0000 02/09/23 0000 02/18/23 0000  NA 134* 135 134* 135   < > 139 138 135*  K 4.4 4.1 4.2 3.8   < > 4.4 4.8 4.7  CL 102 106 103 105   < > 107 105 102  CO2 23 21* 21* 22   < > 22 25* 26*  GLUCOSE 92 113* 90 88  --   --   --   --   BUN 13 15 15 15    < > 23* 22* 24*  CREATININE 1.20 1.14 1.19 1.11   < > 1.1 1.2 1.1  CALCIUM 9.6 9.1 9.1 9.1   < > 10.1 10.7 10.5  MG 1.9 1.9  --  1.9  --   --   --   --   PHOS 2.8  --   --  2.6  --   --   --   --    < > = values in this interval not displayed.   Recent Labs     07/04/22 1101 07/05/22 0309 07/06/22 0406 07/16/22 0000 12/24/22 0000 02/09/23 0000 02/18/23 0000  AST 105* 87* 64*   < > 21 21 17   ALT 38 38 38   < > 16 14 9*  ALKPHOS 55 57 58   < > 80 82 77  BILITOT 1.4* 1.5* 1.5*  --   --   --   --   PROT 6.5 6.5 6.4*  --   --   --   --   ALBUMIN 3.3* 3.2* 3.1*   < > 4.2 4.4 3.9   < > = values in this interval not displayed.   Recent Labs    07/01/22 2346 07/03/22 0816 07/04/22 1101 07/16/22 0000 12/24/22 0000 02/09/23 0000 02/18/23 0000  WBC 12.8* 10.7* 8.9   < > 7.5 7.5 6.9  NEUTROABS 11.2*  --   --    < > 5,303.00 5,415.00 4,637.00  HGB 15.7 16.8 15.9   < > 15.0 13.1* 12.9*  HCT 49.0 49.6 46.8   < > 45 39* 39*  MCV 96.5 92.4 91.9  --   --   --   --   PLT 207 171 187   < > 238 287 247   < > = values in this interval not displayed.   Lab Results  Component Value Date   TSH 1.42 02/09/2023   Lab Results  Component Value Date   HGBA1C  08/24/2009    5.7 (NOTE) The ADA recommends the following therapeutic goal for glycemic control related to Hgb A1c measurement: Goal of therapy: <6.5 Hgb A1c  Reference: American Diabetes Association: Clinical Practice Recommendations 2010, Diabetes Care, 2010, 33: (Suppl  1).   Lab Results  Component Value Date   CHOL 116 12/03/2022   HDL 46 12/03/2022   LDLCALC 54 12/03/2022   LDLDIRECT 55 09/02/2020   TRIG 80 12/03/2022   CHOLHDL 3.3 09/02/2020    Significant Diagnostic Results in last 30 days:  No results found.  Assessment/Plan  Atrial fibrillation, persistent (HCC) Heart rate is in control, on Diltiazem, Eliquis  Bipolar disorder (HCC) , followed by psychiatry, on Lithium, TSH 1.42 02/09/23, 07/16/22 Lithium level 0.6(0.6-1.2)  Insomnia Stable,  takes Trazodone since 02/17/23 by  psych  Chronic CHF (HCC) EF 45-50% 11/2021, not on diuretics, trace edema BLE  CKD (chronic kidney disease) stage 3, GFR 30-59 ml/min (HCC) Bun/creat 24/1.1 02/18/23  Cognitive impairment SNF [facility]  for supportive care, 07/16/22 MMSE 18/30, taking Deonepezil sine 8/21/2 by Psych  Osteoarthritis, multiple sites shoulders pain, worsens at night, limited abduction ROM over shoulder level, hx of neck clicks with ROM, aches, c/o right sciatica pain, worsens in am, chronic left knee pain,  Norco is effective.   Osteoporosis 12/01/22 dc'd Alendronate tscore -2.9 2017, -0.9 01/19/19, refused DEXA 11/02/22  HOH (hard of hearing)  L>R, HPOA-wife declined hearing aids.    Family/ staff Communication: plan of care reviewed with the patient and charge nurses.   Labs/tests ordered:  none  Time spend 30 minutes.

## 2023-05-04 NOTE — Assessment & Plan Note (Signed)
EF 45-50% 11/2021, not on diuretics, trace edema BLE 

## 2023-05-04 NOTE — Assessment & Plan Note (Signed)
SNF [facility] for supportive care, 07/16/22 MMSE 18/30, taking Deonepezil sine 8/21/2 by Merilyn Baba

## 2023-05-04 NOTE — Assessment & Plan Note (Signed)
Heart rate is in control, on Diltiazem, Eliquis

## 2023-05-04 NOTE — Assessment & Plan Note (Signed)
L>R, HPOA-wife declined hearing aids.

## 2023-05-04 NOTE — Assessment & Plan Note (Signed)
Stable,  takes Trazodone since 02/17/23 by psych

## 2023-05-04 NOTE — Assessment & Plan Note (Signed)
,   followed by psychiatry, on Lithium, TSH 1.42 02/09/23, 07/16/22 Lithium level 0.6(0.6-1.2)

## 2023-05-04 NOTE — Assessment & Plan Note (Signed)
shoulders pain, worsens at night, limited abduction ROM over shoulder level, hx of neck clicks with ROM, aches, c/o right sciatica pain, worsens in am, chronic left knee pain,  Norco is effective.

## 2023-05-04 NOTE — Assessment & Plan Note (Signed)
Bun/creat 24/1.1 02/18/23

## 2023-05-04 NOTE — Assessment & Plan Note (Signed)
12/01/22 dc'd Alendronate tscore -2.9 2017, -0.9 01/19/19, refused DEXA 11/02/22

## 2023-05-06 ENCOUNTER — Encounter: Payer: Self-pay | Admitting: Nurse Practitioner

## 2023-05-06 DIAGNOSIS — F319 Bipolar disorder, unspecified: Secondary | ICD-10-CM | POA: Diagnosis not present

## 2023-05-06 DIAGNOSIS — N1831 Chronic kidney disease, stage 3a: Secondary | ICD-10-CM | POA: Diagnosis not present

## 2023-05-06 DIAGNOSIS — I10 Essential (primary) hypertension: Secondary | ICD-10-CM | POA: Diagnosis not present

## 2023-05-06 NOTE — Progress Notes (Signed)
This encounter was created in error - please disregard.

## 2023-05-07 DIAGNOSIS — M25671 Stiffness of right ankle, not elsewhere classified: Secondary | ICD-10-CM | POA: Diagnosis not present

## 2023-05-07 DIAGNOSIS — M6281 Muscle weakness (generalized): Secondary | ICD-10-CM | POA: Diagnosis not present

## 2023-05-07 DIAGNOSIS — R2681 Unsteadiness on feet: Secondary | ICD-10-CM | POA: Diagnosis not present

## 2023-05-07 LAB — BASIC METABOLIC PANEL
BUN: 23 — AB (ref 4–21)
CO2: 26 — AB (ref 13–22)
Chloride: 105 (ref 99–108)
Creatinine: 1.2 (ref 0.6–1.3)
Glucose: 98
Potassium: 4.6 meq/L (ref 3.5–5.1)
Sodium: 138 (ref 137–147)

## 2023-05-07 LAB — COMPREHENSIVE METABOLIC PANEL
Calcium: 10.2 (ref 8.7–10.7)
eGFR: 61

## 2023-05-10 ENCOUNTER — Encounter: Payer: Self-pay | Admitting: Sports Medicine

## 2023-05-10 ENCOUNTER — Telehealth: Payer: Self-pay | Admitting: Sports Medicine

## 2023-05-10 ENCOUNTER — Non-Acute Institutional Stay (SKILLED_NURSING_FACILITY): Payer: Medicare Other | Admitting: Sports Medicine

## 2023-05-10 DIAGNOSIS — N1831 Chronic kidney disease, stage 3a: Secondary | ICD-10-CM | POA: Diagnosis not present

## 2023-05-10 DIAGNOSIS — M25671 Stiffness of right ankle, not elsewhere classified: Secondary | ICD-10-CM | POA: Diagnosis not present

## 2023-05-10 DIAGNOSIS — F0390 Unspecified dementia without behavioral disturbance: Secondary | ICD-10-CM | POA: Diagnosis not present

## 2023-05-10 DIAGNOSIS — R2681 Unsteadiness on feet: Secondary | ICD-10-CM | POA: Diagnosis not present

## 2023-05-10 DIAGNOSIS — I4819 Other persistent atrial fibrillation: Secondary | ICD-10-CM | POA: Diagnosis not present

## 2023-05-10 DIAGNOSIS — I5042 Chronic combined systolic (congestive) and diastolic (congestive) heart failure: Secondary | ICD-10-CM | POA: Diagnosis not present

## 2023-05-10 DIAGNOSIS — F319 Bipolar disorder, unspecified: Secondary | ICD-10-CM

## 2023-05-10 DIAGNOSIS — R051 Acute cough: Secondary | ICD-10-CM

## 2023-05-10 DIAGNOSIS — M6281 Muscle weakness (generalized): Secondary | ICD-10-CM | POA: Diagnosis not present

## 2023-05-10 DIAGNOSIS — M25511 Pain in right shoulder: Secondary | ICD-10-CM | POA: Diagnosis not present

## 2023-05-10 DIAGNOSIS — I10 Essential (primary) hypertension: Secondary | ICD-10-CM | POA: Diagnosis not present

## 2023-05-10 NOTE — Progress Notes (Signed)
Provider: Venita Sheffield MD Location:   [facility]   Place of Service:   Skilled care, [place]  PCP: Sean Cipro, FNP Patient Care Team: Sean Cipro, FNP as PCP - General (Family Medicine) Sean Lea, MD as PCP - Cardiology (Cardiology)  Extended Emergency Contact Information Primary Emergency Contact: Bishop,Sean Address: [phone]           [facility] [address]          [place], [place] [address] [name] Home Phone: [phone] Mobile Phone: [phone] Relation: Spouse Secondary Emergency Contact: Sean Bishop Address: [address].          [place], [place] [address] [place] Home Phone: [phone] Mobile Phone: [phone] Relation: Son  Code Status:  Goals of Care: Advanced Directive information    04/09/2023    3:02 PM  Advanced Directives  Does Patient Have a Medical Advance Directive? Yes  Type of Estate agent of [place];Out of facility DNR (pink MOST or yellow form)  Does patient want to make changes to medical advance directive? No - Patient declined  Copy of Healthcare Power of Attorney in Chart? Yes - validated most recent copy scanned in chart (See row information)      No chief complaint on file.   HPI: Patient is a 89 y.o. male seen today for acute visit for cough  As per nursing staff pt is having dry cough since 2-3 days. He is not eating much. Pt has baseline confusion with intermittent episodes of confusion.  No recent change in agitation, he is being followed by psychiatry.  Pt seen and examined , c/o dry cough States he is unable to sleep at night due to cough  C/o mild Sob  Able to speak in full sentences Denies runny nose, sinus pain, ear pain, sore throat, myalgias, abdominal pain, nausea, vomiting. He knows his name, not oriented to time or place He cannot remember what he had for breakfast.   Past Medical History:   Diagnosis Date   AF (paroxysmal atrial fibrillation) (HCC) 02/16/2018   Atrial fibrillation (HCC)    Atrial fibrillation with RVR (HCC) 02/16/2018   Bipolar 1 disorder (HCC)    Bipolar disorder (HCC) 02/17/2018   Dizziness 02/26/2020   DJD (degenerative joint disease)    Dyslipidemia 02/26/2020   Gait instability 02/16/2018   Hypertension    Mitral regurgitation    mild    Polycythemia 02/16/2018   Renal insufficiency    Stroke [facility])    Tremor 02/16/2018   Tremors of nervous system 01/2018   Past Surgical History:  Procedure Laterality Date   SHOULDER SURGERY      reports that he has quit smoking. He has never used smokeless tobacco. He reports current alcohol use of about 1.0 standard drink of alcohol per week. He reports that he does not use drugs. Social History   Socioeconomic History   Marital status: Married    Spouse name: Not on file   Number of children: Not on file   Years of education: Not on file   Highest education level: Not on file  Occupational History   Not on file  Tobacco Use   Smoking status: Former   Smokeless tobacco: Never  Vaping Use   Vaping status: Never Used  Substance and Sexual Activity   Alcohol use: Yes    Alcohol/week: 1.0 standard drink of alcohol    Types: 1 Glasses of wine per week  Comment: 1 per month   Drug use: No   Sexual activity: Not on file  Other Topics Concern   Not on file  Social History Narrative   Not on file   Social Determinants of Health   Financial Resource Strain: Not on file  Food Insecurity: No Food Insecurity (07/02/2022)   Hunger Vital Sign    Worried About Running Out of Food in the Last Year: Never true    Ran Out of Food in the Last Year: Never true  Transportation Needs: No Transportation Needs (07/02/2022)   PRAPARE - Administrator, Civil Service (Medical): No    Lack of Transportation (Non-Medical): No  Physical Activity: Not on file  Stress: Not on file  Social Connections: Not on file   Intimate Partner Violence: Not At Risk (07/02/2022)   Humiliation, Afraid, Rape, and Kick questionnaire    Fear of Current or Ex-Partner: No    Emotionally Abused: No    Physically Abused: No    Sexually Abused: No    Functional Status Survey:    Family History  Problem Relation Age of Onset   Alcohol abuse Mother    Cancer Father     Health Maintenance  Topic Date Due   COVID-19 Vaccine (1) 09/28/2094 (Originally 04/25/1938)   Medicare Annual Wellness (AWV)  07/14/2023   DTaP/Tdap/Td (3 - Td or Tdap) 08/04/2032   Pneumonia Vaccine 56+ Years old  Completed   INFLUENZA VACCINE  Completed   Zoster Vaccines- Shingrix  Completed   HPV VACCINES  Aged Out    Allergies  Allergen Reactions   Lipitor [Atorvastatin] Other (See Comments)    Joint soreness    Outpatient Encounter Medications as of 05/10/2023  Medication Sig   ascorbic acid (VITAMIN C) 500 MG tablet Take 500 mg by mouth daily.   Calcium Carbonate-Vit D-Min (CALCIUM 600+D PLUS MINERALS) 600-400 MG-UNIT CHEW Chew 1 Piece by mouth in the morning and at bedtime.   diltiazem (CARDIZEM SR) 120 MG 12 hr capsule Take 120 mg by mouth daily at 12 noon.   donepezil (ARICEPT) 10 MG tablet Take 10 mg by mouth at bedtime.   ELIQUIS 5 MG TABS tablet TAKE 1 TABLET BY MOUTH TWICE DAILY.   HYDROcodone-acetaminophen (NORCO/VICODIN) 5-325 MG tablet Take 0.5 tablets by mouth in the morning and at bedtime.   latanoprost (XALATAN) 0.005 % ophthalmic solution Place 1 drop into both eyes at bedtime.   lithium carbonate (LITHOBID) 300 MG ER tablet Take 1 tablet (300 mg total) by mouth every evening.   Multiple Vitamin (MULTIVITAMIN WITH MINERALS) TABS tablet Take 1 tablet by mouth daily.   Polyethyl Glycol-Propyl Glycol (SYSTANE) 0.4-0.3 % SOLN Apply 1 drop to eye every 4 (four) hours as needed (dry eyes).   pravastatin (PRAVACHOL) 20 MG tablet Take 20 mg by mouth at bedtime.   sodium chloride (MURO 128) 5 % ophthalmic solution Place 1 drop  into both eyes at bedtime.   timolol (TIMOPTIC) 0.5 % ophthalmic solution Place 1 drop into both eyes daily.   traZODone (DESYREL) 50 MG tablet Take 1 tablet (50 mg total) by mouth at bedtime.   No facility-administered encounter medications on file as of 05/10/2023.    Review of Systems  Constitutional:  Negative for fever.  HENT:  Negative for ear pain, rhinorrhea, sinus pressure, sinus pain, sore throat and trouble swallowing.   Respiratory:  Positive for cough and shortness of breath. Negative for wheezing.   Cardiovascular:  Negative for chest  pain, palpitations and leg swelling.  Gastrointestinal:  Negative for abdominal distention, abdominal pain, blood in stool, constipation, diarrhea, nausea and vomiting.  Genitourinary:  Negative for dysuria, frequency and hematuria.  Neurological:  Negative for dizziness.  Psychiatric/Behavioral:  Positive for confusion.     There were no vitals filed for this visit. There is no height or weight on file to calculate BMI. Physical Exam Constitutional:      Appearance: Normal appearance.  HENT:     Nose:     Comments: No sinus tenderness No jugular lymphadenopathy Cardiovascular:     Rate and Rhythm: Normal rate. Rhythm irregular.     Heart sounds: Normal heart sounds.  Pulmonary:     Effort: No respiratory distress.     Breath sounds: No stridor. No wheezing or rales.  Abdominal:     General: Bowel sounds are normal. There is no distension.     Palpations: Abdomen is soft.     Tenderness: There is no abdominal tenderness. There is no right CVA tenderness or guarding.  Musculoskeletal:        General: No swelling.  Neurological:     Mental Status: He is alert. Mental status is at baseline.     Labs reviewed: Basic Metabolic Panel: Recent Labs    07/03/22 0816 07/04/22 1101 07/05/22 0309 07/06/22 0406 08/04/22 0000 12/24/22 0000 02/09/23 0000 02/18/23 0000  NA 134* 135 134* 135   < > 139 138 135*  K 4.4 4.1 4.2 3.8   <  > 4.4 4.8 4.7  CL 102 106 103 105   < > 107 105 102  CO2 23 21* 21* 22   < > 22 25* 26*  GLUCOSE 92 113* 90 88  --   --   --   --   BUN 13 15 15 15    < > 23* 22* 24*  CREATININE 1.20 1.14 1.19 1.11   < > 1.1 1.2 1.1  CALCIUM 9.6 9.1 9.1 9.1   < > 10.1 10.7 10.5  MG 1.9 1.9  --  1.9  --   --   --   --   PHOS 2.8  --   --  2.6  --   --   --   --    < > = values in this interval not displayed.   Liver Function Tests: Recent Labs    07/04/22 1101 07/05/22 0309 07/06/22 0406 07/16/22 0000 12/24/22 0000 02/09/23 0000 02/18/23 0000  AST 105* 87* 64*   < > 21 21 17   ALT 38 38 38   < > 16 14 9*  ALKPHOS 55 57 58   < > 80 82 77  BILITOT 1.4* 1.5* 1.5*  --   --   --   --   PROT 6.5 6.5 6.4*  --   --   --   --   ALBUMIN 3.3* 3.2* 3.1*   < > 4.2 4.4 3.9   < > = values in this interval not displayed.   No results for input(s): "LIPASE", "AMYLASE" in the last 8760 hours. Recent Labs    07/02/22 1132  AMMONIA 22   CBC: Recent Labs    07/01/22 2346 07/03/22 0816 07/04/22 1101 07/16/22 0000 12/24/22 0000 02/09/23 0000 02/18/23 0000  WBC 12.8* 10.7* 8.9   < > 7.5 7.5 6.9  NEUTROABS 11.2*  --   --    < > 5,303.00 5,415.00 4,637.00  HGB 15.7 16.8 15.9   < > 15.0  13.1* 12.9*  HCT 49.0 49.6 46.8   < > 45 39* 39*  MCV 96.5 92.4 91.9  --   --   --   --   PLT 207 171 187   < > 238 287 247   < > = values in this interval not displayed.   Cardiac Enzymes: Recent Labs    07/03/22 0816 07/04/22 1101 07/05/22 0309  CKTOTAL 3,117* 653* 279   BNP: Invalid input(s): "POCBNP" Lab Results  Component Value Date   HGBA1C  08/24/2009    5.7 (NOTE) The ADA recommends the following therapeutic goal for glycemic control related to Hgb A1c measurement: Goal of therapy: <6.5 Hgb A1c  Reference: American Diabetes Association: Clinical Practice Recommendations 2010, Diabetes Care, 2010, 33: (Suppl  1).   Lab Results  Component Value Date   TSH 1.42 02/09/2023   Lab Results  Component  Value Date   VITAMINB12 278 07/02/2022   No results found for: "FOLATE" No results found for: "IRON", "TIBC", "FERRITIN"  Imaging and Procedures obtained prior to SNF admission: CT Head Wo Contrast  Result Date: 07/01/2022 CLINICAL DATA:  Head and neck trauma. EXAM: CT HEAD WITHOUT CONTRAST CT CERVICAL SPINE WITHOUT CONTRAST TECHNIQUE: Multidetector CT imaging of the head and cervical spine was performed following the standard protocol without intravenous contrast. Multiplanar CT image reconstructions of the cervical spine were also generated. RADIATION DOSE REDUCTION: This exam was performed according to the departmental dose-optimization program which includes automated exposure control, adjustment of the mA and/or kV according to patient size and/or use of iterative reconstruction technique. COMPARISON:  CT examination dated February 16, 2018 FINDINGS: CT HEAD FINDINGS Brain: No evidence of acute infarction, hemorrhage, extra-axial collection or mass lesion/mass effect. Prominence of the ventricles and sulci secondary to moderate cerebral volume loss. Diffuse low-attenuation of the periventricular white matter presumed advanced chronic microvascular ischemic changes of the white matter. Prominence of the ventricles disproportionate to the volume loss concerning for normal pressure hydrocephalus. Vascular: No hyperdense vessel or unexpected calcification. Skull: Normal. Negative for fracture or focal lesion. Sinuses/Orbits: No acute finding. Other: None. CT CERVICAL SPINE FINDINGS Alignment: Straightening of the cervical spine. Skull base and vertebrae: No acute fracture. No primary bone lesion or focal pathologic process. Soft tissues and spinal canal: No prevertebral fluid or swelling. No visible canal hematoma. Disc levels: Multilevel degenerate disc disease with disc height loss and marginal osteophytes with associated uncovertebral joint and facet joint arthropathy. C2-C3:  No significant findings.  C3-C4: Disc height loss and uncovertebral joint arthropathy with mild right and moderate left neural foraminal stenosis. Mild left facet joint arthropathy. C4-C5: Disc height loss and marked uncovertebral joint arthropathy with moderate-to-severe bilateral neural foraminal stenosis. C5-C6: Disc height loss and uncovertebral joint arthropathy with moderate right and severe left neural foraminal stenosis. C6-C7: Disc height loss and uncovertebral joint arthropathy with mild left neural foraminal stenosis. C7-T1: Disc height loss and facet joint arthropathy. No significant neural foraminal stenosis. Upper chest: Negative. Other: None IMPRESSION: CT HEAD: 1. No acute intracranial abnormality. 2. Moderate cerebral volume loss and advanced chronic microvascular ischemic changes of the white matter. 3. Prominence of the ventricles more than expected for the cerebral volume loss concerning for normal pressure hydrocephalus. CT CERVICAL SPINE: 1. No acute fracture or traumatic subluxation. 2. Multilevel degenerate disc disease with associated uncovertebral joint and facet joint arthropathy. Electronically Signed   By: Larose Hires D.O.   On: 07/01/2022 23:25   CT Cervical Spine Wo Contrast  Result  Date: 07/01/2022 CLINICAL DATA:  Head and neck trauma. EXAM: CT HEAD WITHOUT CONTRAST CT CERVICAL SPINE WITHOUT CONTRAST TECHNIQUE: Multidetector CT imaging of the head and cervical spine was performed following the standard protocol without intravenous contrast. Multiplanar CT image reconstructions of the cervical spine were also generated. RADIATION DOSE REDUCTION: This exam was performed according to the departmental dose-optimization program which includes automated exposure control, adjustment of the mA and/or kV according to patient size and/or use of iterative reconstruction technique. COMPARISON:  CT examination dated February 16, 2018 FINDINGS: CT HEAD FINDINGS Brain: No evidence of acute infarction, hemorrhage,  extra-axial collection or mass lesion/mass effect. Prominence of the ventricles and sulci secondary to moderate cerebral volume loss. Diffuse low-attenuation of the periventricular white matter presumed advanced chronic microvascular ischemic changes of the white matter. Prominence of the ventricles disproportionate to the volume loss concerning for normal pressure hydrocephalus. Vascular: No hyperdense vessel or unexpected calcification. Skull: Normal. Negative for fracture or focal lesion. Sinuses/Orbits: No acute finding. Other: None. CT CERVICAL SPINE FINDINGS Alignment: Straightening of the cervical spine. Skull base and vertebrae: No acute fracture. No primary bone lesion or focal pathologic process. Soft tissues and spinal canal: No prevertebral fluid or swelling. No visible canal hematoma. Disc levels: Multilevel degenerate disc disease with disc height loss and marginal osteophytes with associated uncovertebral joint and facet joint arthropathy. C2-C3:  No significant findings. C3-C4: Disc height loss and uncovertebral joint arthropathy with mild right and moderate left neural foraminal stenosis. Mild left facet joint arthropathy. C4-C5: Disc height loss and marked uncovertebral joint arthropathy with moderate-to-severe bilateral neural foraminal stenosis. C5-C6: Disc height loss and uncovertebral joint arthropathy with moderate right and severe left neural foraminal stenosis. C6-C7: Disc height loss and uncovertebral joint arthropathy with mild left neural foraminal stenosis. C7-T1: Disc height loss and facet joint arthropathy. No significant neural foraminal stenosis. Upper chest: Negative. Other: None IMPRESSION: CT HEAD: 1. No acute intracranial abnormality. 2. Moderate cerebral volume loss and advanced chronic microvascular ischemic changes of the white matter. 3. Prominence of the ventricles more than expected for the cerebral volume loss concerning for normal pressure hydrocephalus. CT CERVICAL  SPINE: 1. No acute fracture or traumatic subluxation. 2. Multilevel degenerate disc disease with associated uncovertebral joint and facet joint arthropathy. Electronically Signed   By: Larose Hires D.O.   On: 07/01/2022 23:25   DG Chest 1 View  Result Date: 07/01/2022 CLINICAL DATA:  Altered mental status. EXAM: CHEST  1 VIEW COMPARISON:  Chest x-ray 09/06/2013 FINDINGS: The heart and mediastinal contours are unchanged. Aortic calcification. No focal consolidation. No pulmonary edema. No pleural effusion. No pneumothorax. No acute osseous abnormality. Severe degenerative changes of the bilateral shoulders. IMPRESSION: No active disease. Electronically Signed   By: Tish Frederickson M.D.   On: 07/01/2022 22:44    Assessment/Plan  1. Acute cough Pt with dry cough since 2-3 days Afebrile Pt denies runny nose, myalgias, sore throat , ear or sinus pain Nursing staff noticed decreased PO intake  Pt is able to speak in full sentences Does not appear to be in distress Will check cbc, bmp,  Will order flu/ covid test Will order chest x ray  Will start tessalon 200mg  tid x 10 days   2. Bipolar affective disorder, remission status unspecified [facility]) Nursing staff reported baseline confusion with intermittent episodes of agitation  Pt is being followed by psychiatry  Lithium level from 11/7 0.5 Cont with lithium  BASIC METABOLIC PANEL GLUCOSE 98 mg/dL 16-10 Final  Fasting  reference interval UREA NITROGEN (BUN) 23 mg/dL 4-09 Final CREATININE 8.11 mg/dL 9.14-7.82 Final EGFR 61 mL/min/1.73 m2 > OR = 60 Final BUN/CREATININE RATIO SEE NOTE: (calc) 6-22 Final  Not Reported: BUN and Creatinine are within  reference range. SODIUM 138 mmol/L 135-146 Final POTASSIUM 4.6 mmol/L 3.5-5.3 Final CHLORIDE 105 mmol/L 98-110 Final CARBON DIOXIDE 26 mmol/L 20-32 Final CALCIUM 10.2 mg/dL 9.5-62.1 Final LITHIUM 0.5 mmol/L 0.6-1.2 L Final  3. Atrial fibrillation, persistent (HCC) No signs of bleeding Cont  with eliquis  4. Major neurocognitive disorder (HCC) Cont with supportive care   Family/ staff Communication:  care plan discussed with the nursing staff  Labs/tests ordered: cbc, bmp, chest x ray, flu, covid testing     I spent greater than  30 minutes for the care of this patient in face to face time, chart review, clinical documentation, patient education.

## 2023-05-10 NOTE — Telephone Encounter (Signed)
See progress note from 11/21

## 2023-05-11 DIAGNOSIS — R2681 Unsteadiness on feet: Secondary | ICD-10-CM | POA: Diagnosis not present

## 2023-05-11 DIAGNOSIS — M25671 Stiffness of right ankle, not elsewhere classified: Secondary | ICD-10-CM | POA: Diagnosis not present

## 2023-05-11 DIAGNOSIS — L570 Actinic keratosis: Secondary | ICD-10-CM | POA: Diagnosis not present

## 2023-05-11 DIAGNOSIS — M6281 Muscle weakness (generalized): Secondary | ICD-10-CM | POA: Diagnosis not present

## 2023-05-11 DIAGNOSIS — L814 Other melanin hyperpigmentation: Secondary | ICD-10-CM | POA: Diagnosis not present

## 2023-05-11 DIAGNOSIS — L821 Other seborrheic keratosis: Secondary | ICD-10-CM | POA: Diagnosis not present

## 2023-05-12 DIAGNOSIS — M6281 Muscle weakness (generalized): Secondary | ICD-10-CM | POA: Diagnosis not present

## 2023-05-12 DIAGNOSIS — M25671 Stiffness of right ankle, not elsewhere classified: Secondary | ICD-10-CM | POA: Diagnosis not present

## 2023-05-12 DIAGNOSIS — R2681 Unsteadiness on feet: Secondary | ICD-10-CM | POA: Diagnosis not present

## 2023-05-13 DIAGNOSIS — I5042 Chronic combined systolic (congestive) and diastolic (congestive) heart failure: Secondary | ICD-10-CM | POA: Diagnosis not present

## 2023-05-13 DIAGNOSIS — M25671 Stiffness of right ankle, not elsewhere classified: Secondary | ICD-10-CM | POA: Diagnosis not present

## 2023-05-13 DIAGNOSIS — R2681 Unsteadiness on feet: Secondary | ICD-10-CM | POA: Diagnosis not present

## 2023-05-13 DIAGNOSIS — M6281 Muscle weakness (generalized): Secondary | ICD-10-CM | POA: Diagnosis not present

## 2023-05-13 LAB — BASIC METABOLIC PANEL: Potassium: 4.8 meq/L (ref 3.5–5.1)

## 2023-05-14 DIAGNOSIS — R2681 Unsteadiness on feet: Secondary | ICD-10-CM | POA: Diagnosis not present

## 2023-05-14 DIAGNOSIS — M25671 Stiffness of right ankle, not elsewhere classified: Secondary | ICD-10-CM | POA: Diagnosis not present

## 2023-05-14 DIAGNOSIS — M6281 Muscle weakness (generalized): Secondary | ICD-10-CM | POA: Diagnosis not present

## 2023-05-17 DIAGNOSIS — M25671 Stiffness of right ankle, not elsewhere classified: Secondary | ICD-10-CM | POA: Diagnosis not present

## 2023-05-17 DIAGNOSIS — R2681 Unsteadiness on feet: Secondary | ICD-10-CM | POA: Diagnosis not present

## 2023-05-17 DIAGNOSIS — M6281 Muscle weakness (generalized): Secondary | ICD-10-CM | POA: Diagnosis not present

## 2023-05-18 DIAGNOSIS — M6281 Muscle weakness (generalized): Secondary | ICD-10-CM | POA: Diagnosis not present

## 2023-05-18 DIAGNOSIS — R2681 Unsteadiness on feet: Secondary | ICD-10-CM | POA: Diagnosis not present

## 2023-05-18 DIAGNOSIS — M25671 Stiffness of right ankle, not elsewhere classified: Secondary | ICD-10-CM | POA: Diagnosis not present

## 2023-05-19 DIAGNOSIS — R2681 Unsteadiness on feet: Secondary | ICD-10-CM | POA: Diagnosis not present

## 2023-05-19 DIAGNOSIS — M6281 Muscle weakness (generalized): Secondary | ICD-10-CM | POA: Diagnosis not present

## 2023-05-19 DIAGNOSIS — M25671 Stiffness of right ankle, not elsewhere classified: Secondary | ICD-10-CM | POA: Diagnosis not present

## 2023-05-20 DIAGNOSIS — M25671 Stiffness of right ankle, not elsewhere classified: Secondary | ICD-10-CM | POA: Diagnosis not present

## 2023-05-20 DIAGNOSIS — R2681 Unsteadiness on feet: Secondary | ICD-10-CM | POA: Diagnosis not present

## 2023-05-20 DIAGNOSIS — M6281 Muscle weakness (generalized): Secondary | ICD-10-CM | POA: Diagnosis not present

## 2023-05-21 DIAGNOSIS — R2681 Unsteadiness on feet: Secondary | ICD-10-CM | POA: Diagnosis not present

## 2023-05-21 DIAGNOSIS — M6281 Muscle weakness (generalized): Secondary | ICD-10-CM | POA: Diagnosis not present

## 2023-05-21 DIAGNOSIS — M25671 Stiffness of right ankle, not elsewhere classified: Secondary | ICD-10-CM | POA: Diagnosis not present

## 2023-05-24 DIAGNOSIS — R2681 Unsteadiness on feet: Secondary | ICD-10-CM | POA: Diagnosis not present

## 2023-05-24 DIAGNOSIS — M6281 Muscle weakness (generalized): Secondary | ICD-10-CM | POA: Diagnosis not present

## 2023-05-24 DIAGNOSIS — M25671 Stiffness of right ankle, not elsewhere classified: Secondary | ICD-10-CM | POA: Diagnosis not present

## 2023-05-25 ENCOUNTER — Non-Acute Institutional Stay (SKILLED_NURSING_FACILITY): Payer: Self-pay | Admitting: Nurse Practitioner

## 2023-05-25 ENCOUNTER — Encounter: Payer: Self-pay | Admitting: Nurse Practitioner

## 2023-05-25 DIAGNOSIS — F319 Bipolar disorder, unspecified: Secondary | ICD-10-CM

## 2023-05-25 DIAGNOSIS — Z66 Do not resuscitate: Secondary | ICD-10-CM | POA: Diagnosis not present

## 2023-05-25 DIAGNOSIS — I4819 Other persistent atrial fibrillation: Secondary | ICD-10-CM

## 2023-05-25 DIAGNOSIS — M150 Primary generalized (osteo)arthritis: Secondary | ICD-10-CM | POA: Diagnosis not present

## 2023-05-25 DIAGNOSIS — R059 Cough, unspecified: Secondary | ICD-10-CM | POA: Diagnosis not present

## 2023-05-25 DIAGNOSIS — I5042 Chronic combined systolic (congestive) and diastolic (congestive) heart failure: Secondary | ICD-10-CM

## 2023-05-25 DIAGNOSIS — R4189 Other symptoms and signs involving cognitive functions and awareness: Secondary | ICD-10-CM | POA: Diagnosis not present

## 2023-05-25 DIAGNOSIS — S2241XD Multiple fractures of ribs, right side, subsequent encounter for fracture with routine healing: Secondary | ICD-10-CM | POA: Diagnosis not present

## 2023-05-25 DIAGNOSIS — I10 Essential (primary) hypertension: Secondary | ICD-10-CM | POA: Diagnosis not present

## 2023-05-25 DIAGNOSIS — N1831 Chronic kidney disease, stage 3a: Secondary | ICD-10-CM | POA: Diagnosis not present

## 2023-05-25 DIAGNOSIS — M25671 Stiffness of right ankle, not elsewhere classified: Secondary | ICD-10-CM | POA: Diagnosis not present

## 2023-05-25 DIAGNOSIS — M6281 Muscle weakness (generalized): Secondary | ICD-10-CM | POA: Diagnosis not present

## 2023-05-25 DIAGNOSIS — R2681 Unsteadiness on feet: Secondary | ICD-10-CM | POA: Diagnosis not present

## 2023-05-25 LAB — HEPATIC FUNCTION PANEL
ALT: 13 U/L (ref 10–40)
AST: 19 (ref 14–40)
Alkaline Phosphatase: 84 (ref 25–125)
Bilirubin, Total: 0.7

## 2023-05-25 LAB — CBC AND DIFFERENTIAL
HCT: 36 — AB (ref 41–53)
Hemoglobin: 11.9 — AB (ref 13.5–17.5)
Neutrophils Absolute: 7590
Platelets: 322 10*3/uL (ref 150–400)
WBC: 10.6

## 2023-05-25 LAB — CBC: RBC: 3.95 (ref 3.87–5.11)

## 2023-05-25 LAB — BASIC METABOLIC PANEL
BUN: 21 (ref 4–21)
CO2: 27 — AB (ref 13–22)
Chloride: 103 (ref 99–108)
Glucose: 93
Potassium: 4.8 meq/L (ref 3.5–5.1)
Sodium: 137 (ref 137–147)

## 2023-05-25 LAB — COMPREHENSIVE METABOLIC PANEL
Albumin: 3.8 (ref 3.5–5.0)
Calcium: 10.7 (ref 8.7–10.7)
Globulin: 3.8
eGFR: 56

## 2023-05-25 NOTE — Assessment & Plan Note (Signed)
Heart rate is in control, on Diltiazem, Eliquis

## 2023-05-25 NOTE — Assessment & Plan Note (Signed)
followed by psychiatry, on Lithium, TSH 1.42 02/09/23, 07/16/22 Lithium level 0.6(0.6-1.2)             Insomnia, takes Trazodone since 02/17/23 by psych

## 2023-05-25 NOTE — Assessment & Plan Note (Signed)
SNF [facility] for supportive care, 07/16/22 MMSE 18/30, taking Deonepezil since 8/21/2 by Psych

## 2023-05-25 NOTE — Assessment & Plan Note (Signed)
OA shoulders pain, worsens at night, limited abduction ROM over shoulder level, hx of neck clicks with ROM, aches, c/o right sciatica pain, worsens in am, chronic left knee pain,  Norco is effective.

## 2023-05-25 NOTE — Progress Notes (Signed)
Location:  [facility] Room Number: 018-A Place of Service:  SNF [phone]) Provider:  Rosangelica Pevehouse Lamar Benes, FNP  Patient Care Team: Moshe Cipro, FNP as PCP - General (Family Medicine) Georgeanna Lea, MD as PCP - Cardiology (Cardiology)  Extended Emergency Contact Information Primary Emergency Contact: Mcchesney,[name] Address: [phone].           [facility] [address]          [place], [place] [address] [name] of [place] Home Phone: [phone] Mobile Phone: [phone] Relation: Spouse Secondary Emergency Contact: [name] Address: [address] [facility].          [place], [place] [address] [place] of [place] Home Phone: [phone] Mobile Phone: [phone] Relation: Son  Code Status:  DNR Goals of care: Advanced Directive information    05/25/2023   11:33 AM  Advanced Directives  Does Patient Have a Medical Advance Directive? Yes  Type of Estate agent of [place];Out of facility DNR (pink MOST or yellow form)  Does patient want to make changes to medical advance directive? No - Patient declined  Copy of Healthcare Power of Attorney in Chart? Yes - validated most recent copy scanned in chart (See row information)     Chief Complaint  Patient presents with   Acute Visit    Acute visit cough and lethargy.    HPI:  Pt is a 89 y.o. male seen today for an acute visit for staff reported cough, lethargy. The patient was seen in w/c, responsive, followed directions and answered questions appropriately. He stated he cough more at night, no phlegm production, denied SOB, chest pain, palpitation. Afebrile, no O2 desaturation. About #3-4Ibs weight gained in the past 6 weeks.   05/10/23 seen for cough for 2-3 days, tessalon x 10 days, negative COVID, cbc, bmp, CXR-mild pulmonary venous congestion, no infiltrate   HOH L>R, HPOA-wife declined hearing aids.              Parkinson's disease,  stopped parkinson's medication because of affected his cognition, had resting tremor in left arm/leg in the past             OP 12/01/22 dc'd Alendronate tscore -2.9 2017, -0.9 01/19/19, refused DEXA 11/02/22             OA shoulders pain, worsens at night, limited abduction ROM over shoulder level, hx of neck clicks with ROM, aches, c/o right sciatica pain, worsens in am, chronic left knee pain,  Norco is effective.              Cognitive impairment, SNF [facility] for supportive care, 07/16/22 MMSE 18/30, taking Deonepezil sine 8/21/2 by Psych             CVA, no focal weakness residual.              Afib, on Diltiazem, Eliquis             Bipolar disorder, followed by psychiatry, on Lithium, TSH 1.42 02/09/23, 07/16/22 Lithium level 0.6(0.6-1.2)             Insomnia, takes Trazodone since 02/17/23 by psych             CHF, EF 45-50% 11/2021, not on diuretics, trace edema BLE             Gait abnormality, uses w/c for mobility mostly.              Hyponatremia, Na 134, Bun/creat  26/1.18 05/10/23             Hyperbilirubinemia, bilirubin 1.0 02/18/23            Polycythemia,  Hgb 12.6 05/10/23             CKD, Bun/creat 26/1.18 05/10/23             Hypogonadism, takes testosterone inj             Hyperlipidemia, takes Pravastatin             Glaucoma, eye drops.     Past Medical History:  Diagnosis Date   AF (paroxysmal atrial fibrillation) (HCC) 02/16/2018   Atrial fibrillation (HCC)    Atrial fibrillation with RVR (HCC) 02/16/2018   Bipolar 1 disorder (HCC)    Bipolar disorder (HCC) 02/17/2018   Dizziness 02/26/2020   DJD (degenerative joint disease)    Dyslipidemia 02/26/2020   Gait instability 02/16/2018   Hypertension    Mitral regurgitation    mild    Polycythemia 02/16/2018   Renal insufficiency    Stroke [facility])    Tremor 02/16/2018   Tremors of nervous system 01/2018   Past Surgical History:  Procedure Laterality Date   SHOULDER SURGERY      Allergies  Allergen Reactions   Lipitor  [Atorvastatin] Other (See Comments)    Joint soreness    Outpatient Encounter Medications as of 05/25/2023  Medication Sig   ascorbic acid (VITAMIN C) 500 MG tablet Take 500 mg by mouth daily.   Calcium Carbonate-Vit D-Min (CALCIUM 600+D PLUS MINERALS) 600-400 MG-UNIT CHEW Chew 1 Piece by mouth in the morning and at bedtime.   donepezil (ARICEPT) 10 MG tablet Take 10 mg by mouth at bedtime.   ELIQUIS 5 MG TABS tablet TAKE 1 TABLET BY MOUTH TWICE DAILY.   HYDROcodone-acetaminophen (NORCO/VICODIN) 5-325 MG tablet Take 0.5 tablets by mouth in the morning and at bedtime.   latanoprost (XALATAN) 0.005 % ophthalmic solution Place 1 drop into both eyes at bedtime.   lithium carbonate (LITHOBID) 300 MG ER tablet Take 1 tablet (300 mg total) by mouth every evening.   metoprolol succinate (TOPROL-XL) 100 MG 24 hr tablet Take 100 mg by mouth daily.   Multiple Vitamin (MULTIVITAMIN WITH MINERALS) TABS tablet Take 1 tablet by mouth daily.   Polyethyl Glycol-Propyl Glycol (SYSTANE) 0.4-0.3 % SOLN Apply 1 drop to eye every 4 (four) hours as needed (dry eyes).   pravastatin (PRAVACHOL) 20 MG tablet Take 20 mg by mouth at bedtime.   sodium chloride (MURO 128) 5 % ophthalmic solution Place 1 drop into both eyes at bedtime.   timolol (TIMOPTIC) 0.5 % ophthalmic solution Place 1 drop into both eyes daily.   traZODone (DESYREL) 50 MG tablet Take 1 tablet (50 mg total) by mouth at bedtime.   diltiazem (CARDIZEM SR) 120 MG 12 hr capsule Take 120 mg by mouth daily at 12 noon. (Patient not taking: Reported on 05/25/2023)   No facility-administered encounter medications on file as of 05/25/2023.    Review of Systems  Constitutional:  Negative for appetite change, fatigue and fever.  HENT:  Positive for hearing loss. Negative for congestion and trouble swallowing.   Eyes:  Negative for visual disturbance.  Respiratory:  Positive for cough. Negative for chest tightness, shortness of breath and wheezing.    Cardiovascular:  Positive for leg swelling.  Gastrointestinal:  Negative for abdominal pain and constipation.  Genitourinary:  Negative for dysuria and urgency.  Musculoskeletal:  Positive  for arthralgias, back pain and gait problem.       Shoulders pain with above shoulder level abduction ROM, worsens at night. R sciatic pain, not new, worse in am when getting up, chronic left knee pain  Skin:  Negative for color change.  Neurological:  Negative for speech difficulty, weakness and headaches.  Psychiatric/Behavioral:  Positive for confusion. Negative for sleep disturbance. The patient is not nervous/anxious.     Immunization History  Administered Date(s) Administered   Hepatitis B, ADULT 11/26/1997, 12/27/1997, 05/08/1998   Influenza, High Dose Seasonal PF 03/31/2023   Influenza-Unspecified 04/15/2022   Moderna Covid-19 Vaccine Bivalent Booster 59yrs & up 04/14/2023   PPD Test 07/08/2022   Pneumococcal Conjugate-13 04/26/2014   Pneumococcal Polysaccharide-23 04/14/2005   Td 08/04/2022   Tdap 05/18/2016   Zoster Recombinant(Shingrix) 07/29/2022, 10/28/2022   Pertinent  Health Maintenance Due  Topic Date Due   INFLUENZA VACCINE  Completed      07/08/2022    9:22 AM 07/10/2022   11:13 AM 08/03/2022    4:30 PM 08/25/2022   11:49 AM 09/24/2022    1:55 PM  Fall Risk  Falls in the past year?  1 1 1  0  Was there an injury with Fall?  1 1 0 0  Fall Risk Category Calculator  3 3 1  0  Fall Risk Category (Retired)  High     (RETIRED) Patient Fall Risk Level High fall risk High fall risk     Patient at Risk for Falls Due to  History of fall(s);Impaired balance/gait;Impaired mobility History of fall(s);Impaired balance/gait;Impaired mobility No Fall Risks   Fall risk Follow up  Falls evaluation completed Falls evaluation completed Falls evaluation completed    Functional Status Survey:    Vitals:   05/25/23 1130  BP: (!) 162/60  Pulse: (!) 54  Resp: 20  Temp: (!) 97.3 F (36.3 C)   SpO2: 95%  Weight: 191 lb 6.4 oz (86.8 kg)  Height: 5\' 6"  (1.676 m)   Body mass index is 30.89 kg/m. Physical Exam Vitals and nursing note reviewed.  Constitutional:      Appearance: Normal appearance.  HENT:     Head: Normocephalic and atraumatic.     Nose: Nose normal.     Mouth/Throat:     Mouth: Mucous membranes are moist.  Eyes:     Extraocular Movements: Extraocular movements intact.     Conjunctiva/sclera: Conjunctivae normal.     Pupils: Pupils are equal, round, and reactive to light.  Cardiovascular:     Rate and Rhythm: Normal rate. Rhythm irregular.     Heart sounds: No murmur heard. Pulmonary:     Effort: Pulmonary effort is normal.     Breath sounds: Rales present.     Comments: Bibasilar rales.  Abdominal:     General: Bowel sounds are normal.     Palpations: Abdomen is soft.     Tenderness: There is no abdominal tenderness.  Musculoskeletal:        General: Tenderness present.     Cervical back: Normal range of motion and neck supple.     Right lower leg: Edema present.     Left lower leg: Edema present.     Comments: 1+ edema BLE. Shoulders pain with above shoulder level abduction ROM, worsens at night, R sciatica pain worse in am/getting out of bed, chronic left knee pain  Skin:    General: Skin is warm and dry.     Comments: The right 5th toe nail yellow  thick, no trauma/injury, no redness or warmth of the toe.   Neurological:     General: No focal deficit present.     Mental Status: He is alert and oriented to person, place, and time. Mental status is at baseline.     Motor: No weakness.     Gait: Gait abnormal.  Psychiatric:        Mood and Affect: Mood normal.        Behavior: Behavior normal.        Thought Content: Thought content normal.     Labs reviewed: Recent Labs    07/03/22 0816 07/04/22 1101 07/05/22 0309 07/06/22 0406 08/04/22 0000 02/09/23 0000 02/18/23 0000 05/07/23 0000 05/13/23 0000  NA 134* 135 134* 135   < > 138  135* 138  --   K 4.4 4.1 4.2 3.8   < > 4.8 4.7 4.6 4.8  CL 102 106 103 105   < > 105 102 105  --   CO2 23 21* 21* 22   < > 25* 26* 26*  --   GLUCOSE 92 113* 90 88  --   --   --   --   --   BUN 13 15 15 15    < > 22* 24* 23*  --   CREATININE 1.20 1.14 1.19 1.11   < > 1.2 1.1 1.2  --   CALCIUM 9.6 9.1 9.1 9.1   < > 10.7 10.5 10.2  --   MG 1.9 1.9  --  1.9  --   --   --   --   --   PHOS 2.8  --   --  2.6  --   --   --   --   --    < > = values in this interval not displayed.   Recent Labs    07/04/22 1101 07/05/22 0309 07/06/22 0406 07/16/22 0000 12/24/22 0000 02/09/23 0000 02/18/23 0000  AST 105* 87* 64*   < > 21 21 17   ALT 38 38 38   < > 16 14 9*  ALKPHOS 55 57 58   < > 80 82 77  BILITOT 1.4* 1.5* 1.5*  --   --   --   --   PROT 6.5 6.5 6.4*  --   --   --   --   ALBUMIN 3.3* 3.2* 3.1*   < > 4.2 4.4 3.9   < > = values in this interval not displayed.   Recent Labs    07/01/22 2346 07/03/22 0816 07/04/22 1101 07/16/22 0000 12/24/22 0000 02/09/23 0000 02/18/23 0000  WBC 12.8* 10.7* 8.9   < > 7.5 7.5 6.9  NEUTROABS 11.2*  --   --    < > 5,303.00 5,415.00 4,637.00  HGB 15.7 16.8 15.9   < > 15.0 13.1* 12.9*  HCT 49.0 49.6 46.8   < > 45 39* 39*  MCV 96.5 92.4 91.9  --   --   --   --   PLT 207 171 187   < > 238 287 247   < > = values in this interval not displayed.   Lab Results  Component Value Date   TSH 1.42 02/09/2023   Lab Results  Component Value Date   HGBA1C  08/24/2009    5.7 (NOTE) The ADA recommends the following therapeutic goal for glycemic control related to Hgb A1c measurement: Goal of therapy: <6.5 Hgb A1c  Reference: American Diabetes Association: Clinical Practice Recommendations  2010, Diabetes Care, 2010, 33: (Suppl  1).   Lab Results  Component Value Date   CHOL 116 12/03/2022   HDL 46 12/03/2022   LDLCALC 54 12/03/2022   LDLDIRECT 55 09/02/2020   TRIG 80 12/03/2022   CHOLHDL 3.3 09/02/2020    Significant Diagnostic Results in last 30 days:   No results found.  Assessment/Plan Chronic CHF (HCC) EF 45-50% 11/2021, trace edema BLE staff reported cough, lethargy. The patient was seen in w/c, responsive, followed directions and answered questions appropriately. He stated he cough more at night, no phlegm production, denied SOB, chest pain, palpitation. Afebrile, no O2 desaturation. About #3-4Ibs weight gained in the past 6 weeks.  05/10/23 seen for cough for 2-3 days, tessalon x 10 days, negative COVID, cbc, bmp, CXR-mild pulmonary venous congestion, no infiltrate Trial of Furosemide 20mg , Kcl po daily, continue Tessalon 100mg  tid x 10 days.  CBC/diff, CMP/eGFR, BNP, CXR ap/lateral.   Osteoarthritis, multiple sites  OA shoulders pain, worsens at night, limited abduction ROM over shoulder level, hx of neck clicks with ROM, aches, c/o right sciatica pain, worsens in am, chronic left knee pain,  Norco is effective.   Cognitive impairment SNF [facility] for supportive care, 07/16/22 MMSE 18/30, taking Deonepezil since 8/21/2 by Psych  Atrial fibrillation, persistent (HCC) Heart rate is in control, on Diltiazem, Eliquis  Bipolar disorder (HCC)  followed by psychiatry, on Lithium, TSH 1.42 02/09/23, 07/16/22 Lithium level 0.6(0.6-1.2)             Insomnia, takes Trazodone since 02/17/23 by psych     Family/ staff Communication: plan of care reviewed with the patient and charge nurse.   Labs/tests ordered:  CBC/diff, CMP/eGFR, CXR ap/lateral  Time spend 30 minutes.

## 2023-05-25 NOTE — Assessment & Plan Note (Addendum)
EF 45-50% 11/2021, trace edema BLE staff reported cough, lethargy. The patient was seen in w/c, responsive, followed directions and answered questions appropriately. He stated he cough more at night, no phlegm production, denied SOB, chest pain, palpitation. Afebrile, no O2 desaturation. About #3-4Ibs weight gained in the past 6 weeks.  05/10/23 seen for cough for 2-3 days, tessalon x 10 days, negative COVID, cbc, bmp, CXR-mild pulmonary venous congestion, no infiltrate Trial of Furosemide 20mg , Kcl po daily, continue Tessalon 100mg  tid x 10 days.  CBC/diff, CMP/eGFR, BNP, CXR ap/lateral.

## 2023-05-26 DIAGNOSIS — R2681 Unsteadiness on feet: Secondary | ICD-10-CM | POA: Diagnosis not present

## 2023-05-26 DIAGNOSIS — M6281 Muscle weakness (generalized): Secondary | ICD-10-CM | POA: Diagnosis not present

## 2023-05-26 DIAGNOSIS — M25671 Stiffness of right ankle, not elsewhere classified: Secondary | ICD-10-CM | POA: Diagnosis not present

## 2023-05-31 DIAGNOSIS — M25671 Stiffness of right ankle, not elsewhere classified: Secondary | ICD-10-CM | POA: Diagnosis not present

## 2023-05-31 DIAGNOSIS — M6281 Muscle weakness (generalized): Secondary | ICD-10-CM | POA: Diagnosis not present

## 2023-05-31 DIAGNOSIS — R2681 Unsteadiness on feet: Secondary | ICD-10-CM | POA: Diagnosis not present

## 2023-06-01 DIAGNOSIS — M25671 Stiffness of right ankle, not elsewhere classified: Secondary | ICD-10-CM | POA: Diagnosis not present

## 2023-06-01 DIAGNOSIS — M6281 Muscle weakness (generalized): Secondary | ICD-10-CM | POA: Diagnosis not present

## 2023-06-01 DIAGNOSIS — R2681 Unsteadiness on feet: Secondary | ICD-10-CM | POA: Diagnosis not present

## 2023-06-02 ENCOUNTER — Non-Acute Institutional Stay (SKILLED_NURSING_FACILITY): Payer: Self-pay | Admitting: Adult Health

## 2023-06-02 ENCOUNTER — Encounter: Payer: Self-pay | Admitting: Adult Health

## 2023-06-02 DIAGNOSIS — I4819 Other persistent atrial fibrillation: Secondary | ICD-10-CM

## 2023-06-02 DIAGNOSIS — I5042 Chronic combined systolic (congestive) and diastolic (congestive) heart failure: Secondary | ICD-10-CM | POA: Diagnosis not present

## 2023-06-02 DIAGNOSIS — R001 Bradycardia, unspecified: Secondary | ICD-10-CM | POA: Diagnosis not present

## 2023-06-02 MED ORDER — METOPROLOL SUCCINATE ER 50 MG PO TB24
50.0000 mg | ORAL_TABLET | Freq: Every day | ORAL | 3 refills | Status: DC
Start: 2023-06-02 — End: 2023-06-28

## 2023-06-02 NOTE — Progress Notes (Signed)
Location:  [facility] Room Number: N018-A Place of Service:  SNF [phone]) Provider:  Kenard Gower, DNP, FNP-BC  Patient Care Team: Georgeanna Lea, MD as PCP - Cardiology (Cardiology)  Extended Emergency Contact Information Primary Emergency Contact: Burgener,[name] Address: [phone].           [facility] [address]          [place], [place] [address] [name] of [place] Home Phone: [phone] Mobile Phone: [phone] Relation: Spouse Secondary Emergency Contact: [name] Address: [address] [facility]          [place], [place] [address] [place] Home Phone: [phone] Mobile Phone: [phone] Relation: Son  Code Status:  DNR  Goals of care: Advanced Directive information    06/02/2023   12:21 PM  Advanced Directives  Does Patient Have a Medical Advance Directive? Yes  Type of Estate agent of [place];Out of facility DNR (pink MOST or yellow form)  Does patient want to make changes to medical advance directive? No - Patient declined  Copy of Healthcare Power of Attorney in Chart? Yes - validated most recent copy scanned in chart (See row information)     Chief Complaint  Patient presents with   Acute Visit     low pulse rate    HPI:  Pt is a 89 y.o. male seen today for an acute visit for low pulse.  He is a resident of [facility]. Charge nurse reported that his pulse was in the 40s this week, ranging from 41 to 48. He is currently taking Toprol XL100 mg daily. SBP ranging from 105 to 151. Patient denies dizziness.   He denies shortness of breath.  He currently takes Lasix for CHF.   Past Medical History:  Diagnosis Date   AF (paroxysmal atrial fibrillation) (HCC) 02/16/2018   Atrial fibrillation (HCC)    Atrial fibrillation with RVR (HCC) 02/16/2018   Bipolar 1 disorder (HCC)    Bipolar disorder (HCC) 02/17/2018   Dizziness 02/26/2020   DJD (degenerative  joint disease)    Dyslipidemia 02/26/2020   Gait instability 02/16/2018   Hypertension    Mitral regurgitation    mild    Polycythemia 02/16/2018   Renal insufficiency    Stroke [facility])    Tremor 02/16/2018   Tremors of nervous system 01/2018   Past Surgical History:  Procedure Laterality Date   SHOULDER SURGERY      Allergies  Allergen Reactions   Lipitor [Atorvastatin] Other (See Comments)    Joint soreness    Outpatient Encounter Medications as of 06/02/2023  Medication Sig   ascorbic acid (VITAMIN C) 500 MG tablet Take 500 mg by mouth daily.   benzonatate (TESSALON) 100 MG capsule Take by mouth 3 (three) times daily.   Calcium Carbonate-Vit D-Min (CALCIUM 600+D PLUS MINERALS) 600-400 MG-UNIT CHEW Chew 1 Piece by mouth in the morning and at bedtime.   donepezil (ARICEPT) 10 MG tablet Take 10 mg by mouth at bedtime.   ELIQUIS 5 MG TABS tablet TAKE 1 TABLET BY MOUTH TWICE DAILY.   furosemide (LASIX) 20 MG tablet Take 20 mg by mouth daily.   HYDROcodone-acetaminophen (NORCO/VICODIN) 5-325 MG tablet Take 0.5 tablets by mouth in the morning and at bedtime.   latanoprost (XALATAN) 0.005 % ophthalmic solution Place 1 drop into both eyes at bedtime.   lithium carbonate (LITHOBID) 300 MG ER tablet Take 1 tablet (300 mg total) by mouth every evening.  metoprolol succinate (TOPROL-XL) 50 MG 24 hr tablet Take 1 tablet (50 mg total) by mouth daily. Take with or immediately following a meal.   Multiple Vitamin (MULTIVITAMIN WITH MINERALS) TABS tablet Take 1 tablet by mouth daily.   Polyethyl Glycol-Propyl Glycol (SYSTANE) 0.4-0.3 % SOLN Apply 1 drop to eye every 4 (four) hours as needed (dry eyes).   potassium chloride (MICRO-K) 10 MEQ CR capsule Take 10 mEq by mouth daily.   pravastatin (PRAVACHOL) 20 MG tablet Take 20 mg by mouth at bedtime.   sodium chloride (MURO 128) 5 % ophthalmic solution Place 1 drop into both eyes at bedtime.   timolol (TIMOPTIC) 0.5 % ophthalmic solution Place 1 drop  into both eyes daily.   traZODone (DESYREL) 50 MG tablet Take 1 tablet (50 mg total) by mouth at bedtime.   [DISCONTINUED] metoprolol succinate (TOPROL-XL) 100 MG 24 hr tablet Take 100 mg by mouth daily.   diltiazem (CARDIZEM SR) 120 MG 12 hr capsule Take 120 mg by mouth daily at 12 noon. (Patient not taking: Reported on 05/25/2023)   No facility-administered encounter medications on file as of 06/02/2023.    Review of Systems  Constitutional:  Negative for activity change, appetite change and fever.  HENT:  Negative for sore throat.   Eyes: Negative.   Cardiovascular:  Negative for chest pain and leg swelling.  Gastrointestinal:  Negative for abdominal distention, diarrhea and vomiting.  Genitourinary:  Negative for dysuria, frequency and urgency.  Skin:  Negative for color change.  Neurological:  Negative for dizziness and headaches.  Psychiatric/Behavioral:  Negative for behavioral problems and sleep disturbance. The patient is not nervous/anxious.       Immunization History  Administered Date(s) Administered   Hepatitis B, ADULT 11/26/1997, 12/27/1997, 05/08/1998   Influenza, High Dose Seasonal PF 03/31/2023   Influenza-Unspecified 04/15/2022   Moderna Covid-19 Vaccine Bivalent Booster 96yrs & up 04/14/2023   PPD Test 07/08/2022   Pneumococcal Conjugate-13 04/26/2014   Pneumococcal Polysaccharide-23 04/14/2005   Td 08/04/2022   Tdap 05/18/2016   Zoster Recombinant(Shingrix) 07/29/2022, 10/28/2022   Pertinent  Health Maintenance Due  Topic Date Due   INFLUENZA VACCINE  Completed      07/08/2022    9:22 AM 07/10/2022   11:13 AM 08/03/2022    4:30 PM 08/25/2022   11:49 AM 09/24/2022    1:55 PM  Fall Risk  Falls in the past year?  1 1 1  0  Was there an injury with Fall?  1 1 0 0  Fall Risk Category Calculator  3 3 1  0  Fall Risk Category (Retired)  High     (RETIRED) Patient Fall Risk Level High fall risk High fall risk     Patient at Risk for Falls Due to  History of  fall(s);Impaired balance/gait;Impaired mobility History of fall(s);Impaired balance/gait;Impaired mobility No Fall Risks   Fall risk Follow up  Falls evaluation completed Falls evaluation completed Falls evaluation completed      Vitals:   06/02/23 1123  BP: (!) 116/58  Pulse: (!) 48  Resp: 16  Temp: 98 F (36.7 C)  SpO2: 97%  Weight: 184 lb 6.4 oz (83.6 kg)  Height: 5\' 6"  (1.676 m)   Body mass index is 29.76 kg/m.  Physical Exam Constitutional:      General: He is not in acute distress. HENT:     Head: Normocephalic and atraumatic.     Mouth/Throat:     Mouth: Mucous membranes are moist.  Eyes:  Conjunctiva/sclera: Conjunctivae normal.  Cardiovascular:     Rate and Rhythm: Bradycardia present. Rhythm irregular.     Pulses: Normal pulses.     Heart sounds: Normal heart sounds.  Pulmonary:     Effort: Pulmonary effort is normal.     Breath sounds: Normal breath sounds.  Abdominal:     General: Bowel sounds are normal.     Palpations: Abdomen is soft.  Musculoskeletal:        General: Swelling present.     Cervical back: Normal range of motion.     Right lower leg: Edema present.     Left lower leg: Edema present.     Comments: Limited ROM on bilateral shoulders. BLE 1+edema  Skin:    General: Skin is warm and dry.  Neurological:     Mental Status: He is alert.  Psychiatric:        Mood and Affect: Mood normal.        Behavior: Behavior normal.      Labs reviewed: Recent Labs    07/03/22 0816 07/04/22 1101 07/05/22 0309 07/06/22 0406 08/04/22 0000 02/09/23 0000 02/18/23 0000 05/07/23 0000 05/13/23 0000  NA 134* 135 134* 135   < > 138 135* 138  --   K 4.4 4.1 4.2 3.8   < > 4.8 4.7 4.6 4.8  CL 102 106 103 105   < > 105 102 105  --   CO2 23 21* 21* 22   < > 25* 26* 26*  --   GLUCOSE 92 113* 90 88  --   --   --   --   --   BUN 13 15 15 15    < > 22* 24* 23*  --   CREATININE 1.20 1.14 1.19 1.11   < > 1.2 1.1 1.2  --   CALCIUM 9.6 9.1 9.1 9.1   < >  10.7 10.5 10.2  --   MG 1.9 1.9  --  1.9  --   --   --   --   --   PHOS 2.8  --   --  2.6  --   --   --   --   --    < > = values in this interval not displayed.   Recent Labs    07/04/22 1101 07/05/22 0309 07/06/22 0406 07/16/22 0000 12/24/22 0000 02/09/23 0000 02/18/23 0000  AST 105* 87* 64*   < > 21 21 17   ALT 38 38 38   < > 16 14 9*  ALKPHOS 55 57 58   < > 80 82 77  BILITOT 1.4* 1.5* 1.5*  --   --   --   --   PROT 6.5 6.5 6.4*  --   --   --   --   ALBUMIN 3.3* 3.2* 3.1*   < > 4.2 4.4 3.9   < > = values in this interval not displayed.   Recent Labs    07/01/22 2346 07/03/22 0816 07/04/22 1101 07/16/22 0000 12/24/22 0000 02/09/23 0000 02/18/23 0000  WBC 12.8* 10.7* 8.9   < > 7.5 7.5 6.9  NEUTROABS 11.2*  --   --    < > 5,303.00 5,415.00 4,637.00  HGB 15.7 16.8 15.9   < > 15.0 13.1* 12.9*  HCT 49.0 49.6 46.8   < > 45 39* 39*  MCV 96.5 92.4 91.9  --   --   --   --   PLT 207 171 187   < >  238 287 247   < > = values in this interval not displayed.   Lab Results  Component Value Date   TSH 1.42 02/09/2023   Lab Results  Component Value Date   HGBA1C  08/24/2009    5.7 (NOTE) The ADA recommends the following therapeutic goal for glycemic control related to Hgb A1c measurement: Goal of therapy: <6.5 Hgb A1c  Reference: American Diabetes Association: Clinical Practice Recommendations 2010, Diabetes Care, 2010, 33: (Suppl  1).   Lab Results  Component Value Date   CHOL 116 12/03/2022   HDL 46 12/03/2022   LDLCALC 54 12/03/2022   LDLDIRECT 55 09/02/2020   TRIG 80 12/03/2022   CHOLHDL 3.3 09/02/2020    Significant Diagnostic Results in last 30 days:  No results found.  Assessment/Plan  1. Bradycardia -  will decrease Toprol XL 100 mg daily to 50 mg daily -  monitor pulse - metoprolol succinate (TOPROL-XL) 50 MG 24 hr tablet; Take 1 tablet (50 mg total) by mouth daily. Take with or immediately following a meal.  Dispense: 30 tablet; Refill: 3  2. Atrial  fibrillation, persistent (HCC) -  rate-controlled -  continue Eliquis for anticoagulation -  will decrease Toprol XL to 50 mg daily - metoprolol succinate (TOPROL-XL) 50 MG 24 hr tablet; Take 1 tablet (50 mg total) by mouth daily. Take with or immediately following a meal.  Dispense: 30 tablet; Refill: 3  3. Chronic combined systolic and diastolic congestive heart failure (HCC) -  no SOB, stable -  continue Lasix    Family/ staff Communication: Discussed plan of care with resident and charge nurse.  Labs/tests ordered:  None    Kenard Gower, DNP, MSN, FNP-BC [facility] [phone] (Monday-Friday 8:00 a.m. - 5:00 p.m.) [phone] (after hours)

## 2023-06-03 DIAGNOSIS — M6281 Muscle weakness (generalized): Secondary | ICD-10-CM | POA: Diagnosis not present

## 2023-06-03 DIAGNOSIS — M25671 Stiffness of right ankle, not elsewhere classified: Secondary | ICD-10-CM | POA: Diagnosis not present

## 2023-06-03 DIAGNOSIS — R2681 Unsteadiness on feet: Secondary | ICD-10-CM | POA: Diagnosis not present

## 2023-06-04 DIAGNOSIS — M6281 Muscle weakness (generalized): Secondary | ICD-10-CM | POA: Diagnosis not present

## 2023-06-04 DIAGNOSIS — M25671 Stiffness of right ankle, not elsewhere classified: Secondary | ICD-10-CM | POA: Diagnosis not present

## 2023-06-04 DIAGNOSIS — R2681 Unsteadiness on feet: Secondary | ICD-10-CM | POA: Diagnosis not present

## 2023-06-08 ENCOUNTER — Encounter: Payer: Self-pay | Admitting: Nurse Practitioner

## 2023-06-08 ENCOUNTER — Non-Acute Institutional Stay (SKILLED_NURSING_FACILITY): Payer: Medicare Other | Admitting: Nurse Practitioner

## 2023-06-08 DIAGNOSIS — I5042 Chronic combined systolic (congestive) and diastolic (congestive) heart failure: Secondary | ICD-10-CM | POA: Diagnosis not present

## 2023-06-08 DIAGNOSIS — E871 Hypo-osmolality and hyponatremia: Secondary | ICD-10-CM

## 2023-06-08 DIAGNOSIS — R001 Bradycardia, unspecified: Secondary | ICD-10-CM

## 2023-06-08 DIAGNOSIS — I4821 Permanent atrial fibrillation: Secondary | ICD-10-CM

## 2023-06-08 DIAGNOSIS — R251 Tremor, unspecified: Secondary | ICD-10-CM

## 2023-06-08 DIAGNOSIS — R4189 Other symptoms and signs involving cognitive functions and awareness: Secondary | ICD-10-CM

## 2023-06-08 DIAGNOSIS — R269 Unspecified abnormalities of gait and mobility: Secondary | ICD-10-CM

## 2023-06-08 DIAGNOSIS — M150 Primary generalized (osteo)arthritis: Secondary | ICD-10-CM

## 2023-06-08 DIAGNOSIS — F319 Bipolar disorder, unspecified: Secondary | ICD-10-CM

## 2023-06-08 DIAGNOSIS — M810 Age-related osteoporosis without current pathological fracture: Secondary | ICD-10-CM

## 2023-06-08 NOTE — Assessment & Plan Note (Signed)
EF 45-50% 11/2021, taking Furosemide, trace edema BLE

## 2023-06-08 NOTE — Assessment & Plan Note (Signed)
OP 12/01/22 dc'd Alendronate tscore -2.9 2017, -0.9 01/19/19, refused DEXA 11/02/22

## 2023-06-08 NOTE — Assessment & Plan Note (Signed)
HR 48   off Diltiazem 04/09/23 2/2 edema), decreased Metoprolol to 25mg  06/08/23 2/2 bradycardia, taking  Eliquis VS daily.

## 2023-06-08 NOTE — Assessment & Plan Note (Signed)
stopped parkinson's medication because of affected his cognition, had resting tremor in left arm/leg in the past

## 2023-06-08 NOTE — Assessment & Plan Note (Signed)
uses w/c for mobility mostly.

## 2023-06-08 NOTE — Progress Notes (Unsigned)
Location:  [facility] Room Number: 18-A Place of Service:  [phone]) Provider:  Talon Regala Dicie Beam  No primary care provider on file.  Patient Care Team: Georgeanna Lea, MD as PCP - Cardiology (Cardiology)  Extended Emergency Contact Information Primary Emergency Contact: Morten,[name] Address: [address]           [facility] [address]          [place], [place] [address] [name] Home Phone: [phone] Mobile Phone: [phone] Relation: Spouse Secondary Emergency Contact: [name] Address: [address]          [place], [place] [address] [place] Home Phone: [phone] Mobile Phone: [phone] Relation: Son  Code Status:  DNR Goals of care: Advanced Directive information    06/08/2023    9:43 AM  Advanced Directives  Does Patient Have a Medical Advance Directive? Yes  Type of Estate agent of [place];Out of facility DNR (pink MOST or yellow form)  Does patient want to make changes to medical advance directive? No - Patient declined  Copy of Healthcare Power of Attorney in Chart? Yes - validated most recent copy scanned in chart (See row information)  Pre-existing out of facility DNR order (yellow form or pink MOST form) Yellow form placed in chart (order not valid for inpatient use)     Chief Complaint  Patient presents with   Routine    HPI:  Pt is a 89 y.o. male seen today for medical management of chronic diseases.     05/10/23 seen for cough for 2-3 days, tessalon x 10 days, negative COVID, cbc, bmp, CXR-mild pulmonary venous congestion, no infiltrate              HOH L>R, HPOA-wife declined hearing aids.              Parkinson's disease, stopped parkinson's medication because of affected his cognition, had resting tremor in left arm/leg in the past             OP 12/01/22 dc'd Alendronate tscore -2.9 2017, -0.9 01/19/19, refused DEXA 11/02/22             OA  shoulders pain, worsens at night, limited abduction ROM over shoulder level, hx of neck clicks with ROM, aches, c/o right sciatica pain, worsens in am, chronic left knee pain,  Norco is effective.              Cognitive impairment, SNF [facility] for supportive care, 07/16/22 MMSE 18/30, taking Deonepezil sine 8/21/2 by Psych             CVA, no focal weakness residual.              Afib, off Diltiazem 04/09/23 2/2 edema), decreased Metoprolol to 25mg  06/08/23 2/2 bradycardia, taking  Eliquis  Bradycardia, decreased Metoprolol 25/50mg  06/08/23 2/2 bradycardia, decreased 50/100mg  06/02/23(switched to Metoprolol from Diltiazem 04/09/23 2/2 to edema BLE)             Bipolar disorder, followed by psychiatry, on Lithium, TSH 1.42 02/09/23, 07/16/22 Lithium level 0.6(0.6-1.2)             Insomnia, takes Trazodone since 02/17/23 by psych             CHF, EF 45-50% 11/2021, taking Furosemide, trace edema BLE             Gait abnormality, uses w/c for mobility mostly.  Hyponatremia, Na 134, Bun/creat 26/1.18 05/10/23             Hyperbilirubinemia, bilirubin 1.0 02/18/23            Polycythemia,  Hgb 12.6 05/10/23             CKD, Bun/creat 26/1.18 05/10/23             Hypogonadism, hx of  testosterone inj             Hyperlipidemia, takes Pravastatin             Glaucoma, eye drops.     Past Medical History:  Diagnosis Date   AF (paroxysmal atrial fibrillation) (HCC) 02/16/2018   Atrial fibrillation (HCC)    Atrial fibrillation with RVR (HCC) 02/16/2018   Bipolar 1 disorder (HCC)    Bipolar disorder (HCC) 02/17/2018   Dizziness 02/26/2020   DJD (degenerative joint disease)    Dyslipidemia 02/26/2020   Gait instability 02/16/2018   Hypertension    Mitral regurgitation    mild    Polycythemia 02/16/2018   Renal insufficiency    Stroke [facility])    Tremor 02/16/2018   Tremors of nervous system 01/2018   Past Surgical History:  Procedure Laterality Date   SHOULDER SURGERY      Allergies  Allergen  Reactions   Lipitor [Atorvastatin] Other (See Comments)    Joint soreness    Outpatient Encounter Medications as of 06/08/2023  Medication Sig   ascorbic acid (VITAMIN C) 500 MG tablet Take 500 mg by mouth daily.   benzonatate (TESSALON) 100 MG capsule Take by mouth 3 (three) times daily.   Calcium Carbonate-Vit D-Min (CALCIUM 600+D PLUS MINERALS) 600-400 MG-UNIT CHEW Chew 1 Piece by mouth in the morning and at bedtime.   donepezil (ARICEPT) 10 MG tablet Take 10 mg by mouth at bedtime.   ELIQUIS 5 MG TABS tablet TAKE 1 TABLET BY MOUTH TWICE DAILY.   furosemide (LASIX) 20 MG tablet Take 20 mg by mouth daily.   HYDROcodone-acetaminophen (NORCO/VICODIN) 5-325 MG tablet Take 0.5 tablets by mouth in the morning and at bedtime.   latanoprost (XALATAN) 0.005 % ophthalmic solution Place 1 drop into both eyes at bedtime.   lithium carbonate (LITHOBID) 300 MG ER tablet Take 1 tablet (300 mg total) by mouth every evening.   metoprolol succinate (TOPROL-XL) 50 MG 24 hr tablet Take 1 tablet (50 mg total) by mouth daily. Take with or immediately following a meal.   Multiple Vitamin (MULTIVITAMIN WITH MINERALS) TABS tablet Take 1 tablet by mouth daily.   Polyethyl Glycol-Propyl Glycol (SYSTANE) 0.4-0.3 % SOLN Apply 1 drop to eye every 4 (four) hours as needed (dry eyes).   potassium chloride (MICRO-K) 10 MEQ CR capsule Take 10 mEq by mouth daily.   pravastatin (PRAVACHOL) 20 MG tablet Take 20 mg by mouth at bedtime.   sodium chloride (MURO 128) 5 % ophthalmic solution Place 1 drop into both eyes at bedtime.   timolol (TIMOPTIC) 0.5 % ophthalmic solution Place 1 drop into both eyes daily.   traZODone (DESYREL) 50 MG tablet Take 1 tablet (50 mg total) by mouth at bedtime.   [DISCONTINUED] metoprolol succinate (TOPROL-XL) 100 MG 24 hr tablet Take 100 mg by mouth daily.   No facility-administered encounter medications on file as of 06/08/2023.    Review of Systems  Constitutional:  Negative for appetite  change, fatigue and fever.  HENT:  Positive for hearing loss. Negative for congestion and trouble swallowing.  Eyes:  Negative for visual disturbance.  Respiratory:  Positive for cough. Negative for chest tightness, shortness of breath and wheezing.   Cardiovascular:  Positive for leg swelling.  Gastrointestinal:  Negative for abdominal pain and constipation.  Genitourinary:  Negative for dysuria and urgency.       Incontinent of urine.   Musculoskeletal:  Positive for arthralgias, back pain and gait problem.       Shoulders pain with above shoulder level abduction ROM, worsens at night. R sciatic pain, not new, worse in am when getting up, chronic left knee pain  Skin:  Negative for color change.  Neurological:  Negative for speech difficulty, weakness and headaches.  Psychiatric/Behavioral:  Positive for confusion and sleep disturbance. The patient is not nervous/anxious.        Early am awake, chronic    Immunization History  Administered Date(s) Administered   Hepatitis B, ADULT 11/26/1997, 12/27/1997, 05/08/1998   Influenza, High Dose Seasonal PF 03/31/2023   Influenza-Unspecified 04/15/2022   Moderna Covid-19 Vaccine Bivalent Booster 92yrs & up 04/14/2023   PPD Test 07/08/2022   Pneumococcal Conjugate-13 04/26/2014   Pneumococcal Polysaccharide-23 04/14/2005   Td 08/04/2022   Tdap 05/18/2016   Zoster Recombinant(Shingrix) 07/29/2022, 10/28/2022   Pertinent  Health Maintenance Due  Topic Date Due   INFLUENZA VACCINE  Completed      07/08/2022    9:22 AM 07/10/2022   11:13 AM 08/03/2022    4:30 PM 08/25/2022   11:49 AM 09/24/2022    1:55 PM  Fall Risk  Falls in the past year?  1 1 1  0  Was there an injury with Fall?  1 1 0 0  Fall Risk Category Calculator  3 3 1  0  Fall Risk Category (Retired)  High     (RETIRED) Patient Fall Risk Level High fall risk High fall risk     Patient at Risk for Falls Due to  History of fall(s);Impaired balance/gait;Impaired mobility History of  fall(s);Impaired balance/gait;Impaired mobility No Fall Risks   Fall risk Follow up  Falls evaluation completed Falls evaluation completed Falls evaluation completed    Functional Status Survey:    Vitals:   06/08/23 0940  BP: (!) 150/58  Pulse: (!) 48  Resp: 16  Temp: 98 F (36.7 C)  SpO2: 97%  Weight: 184 lb 12.8 oz (83.8 kg)  Height: 5\' 6"  (1.676 m)   Body mass index is 29.83 kg/m. Physical Exam Vitals and nursing note reviewed.  Constitutional:      Appearance: Normal appearance.  HENT:     Head: Normocephalic and atraumatic.     Nose: Nose normal.     Mouth/Throat:     Mouth: Mucous membranes are moist.  Eyes:     Extraocular Movements: Extraocular movements intact.     Conjunctiva/sclera: Conjunctivae normal.     Pupils: Pupils are equal, round, and reactive to light.  Cardiovascular:     Rate and Rhythm: Bradycardia present. Rhythm irregular.     Heart sounds: No murmur heard. Pulmonary:     Effort: Pulmonary effort is normal.     Breath sounds: No rales.  Abdominal:     General: Bowel sounds are normal.     Palpations: Abdomen is soft.     Tenderness: There is no abdominal tenderness.  Musculoskeletal:        General: Tenderness present.     Cervical back: Normal range of motion and neck supple.     Right lower leg: Edema present.  Left lower leg: Edema present.     Comments: trace edema BLE. Shoulders pain with above shoulder level abduction ROM, worsens at night, R sciatica pain worse in am/getting out of bed, chronic left knee pain  Skin:    General: Skin is warm and dry.     Comments: The right 5th toe nail yellow thick, no trauma/injury, no redness or warmth of the toe.   Neurological:     General: No focal deficit present.     Mental Status: He is alert and oriented to person, place, and time. Mental status is at baseline.     Motor: No weakness.     Gait: Gait abnormal.  Psychiatric:        Mood and Affect: Mood normal.        Behavior:  Behavior normal.        Thought Content: Thought content normal.     Labs reviewed: Recent Labs    07/03/22 0816 07/04/22 1101 07/05/22 0309 07/06/22 0406 08/04/22 0000 02/09/23 0000 02/18/23 0000 05/07/23 0000 05/13/23 0000  NA 134* 135 134* 135   < > 138 135* 138  --   K 4.4 4.1 4.2 3.8   < > 4.8 4.7 4.6 4.8  CL 102 106 103 105   < > 105 102 105  --   CO2 23 21* 21* 22   < > 25* 26* 26*  --   GLUCOSE 92 113* 90 88  --   --   --   --   --   BUN 13 15 15 15    < > 22* 24* 23*  --   CREATININE 1.20 1.14 1.19 1.11   < > 1.2 1.1 1.2  --   CALCIUM 9.6 9.1 9.1 9.1   < > 10.7 10.5 10.2  --   MG 1.9 1.9  --  1.9  --   --   --   --   --   PHOS 2.8  --   --  2.6  --   --   --   --   --    < > = values in this interval not displayed.   Recent Labs    07/04/22 1101 07/05/22 0309 07/06/22 0406 07/16/22 0000 12/24/22 0000 02/09/23 0000 02/18/23 0000  AST 105* 87* 64*   < > 21 21 17   ALT 38 38 38   < > 16 14 9*  ALKPHOS 55 57 58   < > 80 82 77  BILITOT 1.4* 1.5* 1.5*  --   --   --   --   PROT 6.5 6.5 6.4*  --   --   --   --   ALBUMIN 3.3* 3.2* 3.1*   < > 4.2 4.4 3.9   < > = values in this interval not displayed.   Recent Labs    07/01/22 2346 07/03/22 0816 07/04/22 1101 07/16/22 0000 12/24/22 0000 02/09/23 0000 02/18/23 0000  WBC 12.8* 10.7* 8.9   < > 7.5 7.5 6.9  NEUTROABS 11.2*  --   --    < > 5,303.00 5,415.00 4,637.00  HGB 15.7 16.8 15.9   < > 15.0 13.1* 12.9*  HCT 49.0 49.6 46.8   < > 45 39* 39*  MCV 96.5 92.4 91.9  --   --   --   --   PLT 207 171 187   < > 238 287 247   < > = values in this interval not displayed.  Lab Results  Component Value Date   TSH 1.42 02/09/2023   Lab Results  Component Value Date   HGBA1C  08/24/2009    5.7 (NOTE) The ADA recommends the following therapeutic goal for glycemic control related to Hgb A1c measurement: Goal of therapy: <6.5 Hgb A1c  Reference: American Diabetes Association: Clinical Practice Recommendations 2010,  Diabetes Care, 2010, 33: (Suppl  1).   Lab Results  Component Value Date   CHOL 116 12/03/2022   HDL 46 12/03/2022   LDLCALC 54 12/03/2022   LDLDIRECT 55 09/02/2020   TRIG 80 12/03/2022   CHOLHDL 3.3 09/02/2020    Significant Diagnostic Results in last 30 days:  No results found.  Assessment/Plan Atrial fibrillation (HCC) HR 48   off Diltiazem 04/09/23 2/2 edema), decreased Metoprolol to 25mg  06/08/23 2/2 bradycardia, taking  Eliquis VS daily.   Bradycardia  decreased Metoprolol 25/50mg  06/08/23 2/2 bradycardia, decreased 50/100mg  06/02/23(switched to Metoprolol from Diltiazem 04/09/23 2/2 to edema BLE)  Bipolar disorder (HCC) followed by psychiatry, on Lithium, TSH 1.42 02/09/23, 07/16/22 Lithium level 0.6(0.6-1.2), Insomnia, takes Trazodone since 02/17/23 by psych, the patient reported some early am awakes.   Chronic CHF (HCC) EF 45-50% 11/2021, taking Furosemide, trace edema BLE  Gait abnormality uses w/c for mobility mostly.   Hyponatremia  Na 134, Bun/creat 26/1.18 05/10/23  Tremor stopped parkinson's medication because of affected his cognition, had resting tremor in left arm/leg in the past  Osteoporosis   OP 12/01/22 dc'd Alendronate tscore -2.9 2017, -0.9 01/19/19, refused DEXA 11/02/22  Osteoarthritis, multiple sites  shoulders pain, worsens at night, limited abduction ROM over shoulder level, hx of neck clicks with ROM, aches, c/o right sciatica pain, worsens in am, chronic left knee pain,  Norco is effective.   Cognitive impairment SNF [facility] for supportive care, 07/16/22 MMSE 18/30, taking Deonepezil sine 8/21/2 by Psych     Family/ staff Communication: plan of care reviewed with the patient and charge nurse.   Labs/tests ordered:  none  Time spend 30 minutes.

## 2023-06-08 NOTE — Assessment & Plan Note (Signed)
followed by psychiatry, on Lithium, TSH 1.42 02/09/23, 07/16/22 Lithium level 0.6(0.6-1.2), Insomnia, takes Trazodone since 02/17/23 by psych, the patient reported some early am awakes.

## 2023-06-08 NOTE — Assessment & Plan Note (Signed)
SNF [facility] for supportive care, 07/16/22 MMSE 18/30, taking Deonepezil sine 8/21/2 by Merilyn Baba

## 2023-06-08 NOTE — Assessment & Plan Note (Signed)
decreased Metoprolol 25/50mg  06/08/23 2/2 bradycardia, decreased 50/100mg  06/02/23(switched to Metoprolol from Diltiazem 04/09/23 2/2 to edema BLE)

## 2023-06-08 NOTE — Assessment & Plan Note (Signed)
shoulders pain, worsens at night, limited abduction ROM over shoulder level, hx of neck clicks with ROM, aches, c/o right sciatica pain, worsens in am, chronic left knee pain,  Norco is effective.

## 2023-06-08 NOTE — Assessment & Plan Note (Signed)
Na 134, Bun/creat 26/1.18 05/10/23

## 2023-06-11 NOTE — Telephone Encounter (Signed)
No you would have to update that yourself and whatever encounters align with those changed.

## 2023-06-11 NOTE — Telephone Encounter (Signed)
Message routed to Mast, Man, NP

## 2023-06-14 DIAGNOSIS — M19011 Primary osteoarthritis, right shoulder: Secondary | ICD-10-CM | POA: Diagnosis not present

## 2023-06-28 ENCOUNTER — Encounter: Payer: Self-pay | Admitting: Sports Medicine

## 2023-06-28 ENCOUNTER — Non-Acute Institutional Stay (SKILLED_NURSING_FACILITY): Payer: Self-pay | Admitting: Sports Medicine

## 2023-06-28 DIAGNOSIS — M1711 Unilateral primary osteoarthritis, right knee: Secondary | ICD-10-CM

## 2023-06-28 DIAGNOSIS — G629 Polyneuropathy, unspecified: Secondary | ICD-10-CM | POA: Diagnosis not present

## 2023-07-02 ENCOUNTER — Non-Acute Institutional Stay (SKILLED_NURSING_FACILITY): Payer: Self-pay | Admitting: Sports Medicine

## 2023-07-02 ENCOUNTER — Encounter: Payer: Self-pay | Admitting: Sports Medicine

## 2023-07-02 DIAGNOSIS — I480 Paroxysmal atrial fibrillation: Secondary | ICD-10-CM | POA: Diagnosis not present

## 2023-07-02 DIAGNOSIS — F0390 Unspecified dementia without behavioral disturbance: Secondary | ICD-10-CM | POA: Diagnosis not present

## 2023-07-02 DIAGNOSIS — I10 Essential (primary) hypertension: Secondary | ICD-10-CM

## 2023-07-02 DIAGNOSIS — I5042 Chronic combined systolic (congestive) and diastolic (congestive) heart failure: Secondary | ICD-10-CM

## 2023-07-02 DIAGNOSIS — G629 Polyneuropathy, unspecified: Secondary | ICD-10-CM

## 2023-07-02 DIAGNOSIS — K5900 Constipation, unspecified: Secondary | ICD-10-CM

## 2023-07-02 DIAGNOSIS — N401 Enlarged prostate with lower urinary tract symptoms: Secondary | ICD-10-CM

## 2023-07-02 DIAGNOSIS — N1831 Chronic kidney disease, stage 3a: Secondary | ICD-10-CM

## 2023-07-02 DIAGNOSIS — M150 Primary generalized (osteo)arthritis: Secondary | ICD-10-CM

## 2023-07-02 DIAGNOSIS — R351 Nocturia: Secondary | ICD-10-CM

## 2023-07-02 NOTE — Progress Notes (Signed)
 Location:  [facility] Room Number: 18-A Place of Service:  SNF (31) Provider:  Jackalyn Sherlynn COME  No primary care provider on file.  Patient Care Team: Krasowski, Robert J, MD as PCP - Cardiology (Cardiology)  Extended Emergency Contact Information Primary Emergency Contact: Utley,[name] Address: [address]           [facility] [address]          [place], [place] [address] [place] Home Phone: [phone] Mobile Phone: [phone] Relation: Spouse Secondary Emergency Contact: Debose,Andy Address: [address].          [place], [place] [address] [place] Home Phone: [phone] Mobile Phone: [phone] Relation: Son  Code Status:  DNR Goals of care: Advanced Directive information    07/02/2023    2:59 PM  Advanced Directives  Does Patient Have a Medical Advance Directive? Yes  Type of Advance Directive Out of facility DNR (pink MOST or yellow form)  Does patient want to make changes to medical advance directive? No - Patient declined  Pre-existing out of facility DNR order (yellow form or pink MOST form) Yellow form placed in chart (order not valid for inpatient use)     Chief Complaint  Patient presents with   Routine    HPI:  Pt is a 89 y.o. male seen today for medical management of chronic diseases.   Pt seen and examined in his room  C/o Itching on his Left calf since few days Attributes to compression stockings Hasn't started new meds  Rt shoulder pain - c/o intermittent pain in his Rt shoulder  Followed with Ortho and received steroid injection  On norco prn   Bipolar disorder  Follows with psychiatry  On lithium     Constipation -  C/o moving hard stools Drinking prune juice Had BM this morning   Nocturia Pt c/o weak stream and straining with urination  Wakes up 3-4 times at night to urinate Denies dysuria, hematuria, bloody or dark stools.   Neuropathy -  Pt  reports that burning sensation improved on his thigh  He was started on gabapentin last week    Past Medical History:  Diagnosis Date   AF (paroxysmal atrial fibrillation) (HCC) 02/16/2018   Atrial fibrillation (HCC)    Atrial fibrillation with RVR (HCC) 02/16/2018   Bipolar 1 disorder (HCC)    Bipolar disorder (HCC) 02/17/2018   Dizziness 02/26/2020   DJD (degenerative joint disease)    Dyslipidemia 02/26/2020   Gait instability 02/16/2018   Hypertension    Mitral regurgitation    mild    Polycythemia 02/16/2018   Renal insufficiency    Stroke [facility])    Tremor 02/16/2018   Tremors of nervous system 01/2018   Past Surgical History:  Procedure Laterality Date   SHOULDER SURGERY      Allergies  Allergen Reactions   Lipitor [Atorvastatin] Other (See Comments)    Joint soreness    Outpatient Encounter Medications as of 07/02/2023  Medication Sig   ascorbic acid  (VITAMIN C ) 500 MG tablet Take 500 mg by mouth daily.   Calcium Carbonate-Vit D-Min (CALCIUM 600+D PLUS MINERALS) 600-400 MG-UNIT CHEW Chew 1 Piece by mouth in the morning and at bedtime.   donepezil  (ARICEPT ) 10 MG tablet Take 10 mg by mouth at bedtime.   ELIQUIS  5 MG TABS tablet TAKE 1 TABLET BY MOUTH TWICE DAILY.   furosemide (LASIX) 20 MG tablet Take 20 mg by mouth daily.  gabapentin (NEURONTIN) 100 MG capsule Take 100 mg by mouth at bedtime.   HYDROcodone -acetaminophen  (NORCO/VICODIN) 5-325 MG tablet Take 0.5 tablets by mouth in the morning and at bedtime.   latanoprost  (XALATAN ) 0.005 % ophthalmic solution Place 1 drop into both eyes at bedtime.   lithium  carbonate (LITHOBID ) 300 MG ER tablet Take 1 tablet (300 mg total) by mouth every evening.   metoprolol  succinate (TOPROL -XL) 25 MG 24 hr tablet Take 25 mg by mouth daily.   Multiple Vitamin (MULTIVITAMIN WITH MINERALS) TABS tablet Take 1 tablet by mouth daily.   Polyethyl Glycol-Propyl Glycol (SYSTANE) 0.4-0.3 % SOLN Apply 1 drop to eye every 4 (four) hours as needed  (dry eyes).   potassium chloride (MICRO-K) 10 MEQ CR capsule Take 10 mEq by mouth daily.   pravastatin  (PRAVACHOL ) 20 MG tablet Take 20 mg by mouth at bedtime.   sodium chloride  (MURO 128) 5 % ophthalmic solution Place 1 drop into both eyes at bedtime.   timolol  (TIMOPTIC ) 0.5 % ophthalmic solution Place 1 drop into both eyes daily.   traZODone  (DESYREL ) 50 MG tablet Take 1 tablet (50 mg total) by mouth at bedtime.   [DISCONTINUED] metoprolol  succinate (TOPROL -XL) 100 MG 24 hr tablet Take 100 mg by mouth daily.   No facility-administered encounter medications on file as of 07/02/2023.    Review of Systems  Constitutional:  Negative for chills and fever.  Respiratory:  Negative for cough, shortness of breath and wheezing.   Cardiovascular:  Negative for chest pain, palpitations and leg swelling.  Gastrointestinal:  Positive for constipation. Negative for abdominal distention, abdominal pain, blood in stool, diarrhea, nausea and vomiting.  Genitourinary:  Positive for frequency. Negative for dysuria and urgency.  Skin:        Itching on left calf  Neurological:  Negative for dizziness, weakness and numbness.    Immunization History  Administered Date(s) Administered   Hepatitis B, ADULT 11/26/1997, 12/27/1997, 05/08/1998   Influenza, High Dose Seasonal PF 03/31/2023   Influenza-Unspecified 04/15/2022   Moderna Covid-19 Vaccine Bivalent Booster 79yrs & up 04/14/2023   PPD Test 07/08/2022   Pneumococcal Conjugate-13 04/26/2014   Pneumococcal Polysaccharide-23 04/14/2005   RSV,unspecified 08/19/2022   Td 08/04/2022   Tdap 05/18/2016   Zoster Recombinant(Shingrix) 07/29/2022, 10/28/2022   Pertinent  Health Maintenance Due  Topic Date Due   INFLUENZA VACCINE  Completed      07/08/2022    9:22 AM 07/10/2022   11:13 AM 08/03/2022    4:30 PM 08/25/2022   11:49 AM 09/24/2022    1:55 PM  Fall Risk  Falls in the past year?  1 1 1  0  Was there an injury with Fall?  1 1 0 0  Fall Risk  Category Calculator  3 3 1  0  Fall Risk Category (Retired)  High     (RETIRED) Patient Fall Risk Level High fall risk High fall risk     Patient at Risk for Falls Due to  History of fall(s);Impaired balance/gait;Impaired mobility History of fall(s);Impaired balance/gait;Impaired mobility No Fall Risks   Fall risk Follow up  Falls evaluation completed Falls evaluation completed Falls evaluation completed    Functional Status Survey:    Vitals:   07/02/23 1446  BP: (!) 109/55  Pulse: 78  Resp: 18  Temp: (!) 97.3 F (36.3 C)  SpO2: 97%  Weight: 185 lb 3.2 oz (84 kg)  Height: 5' 6 (1.676 m)   Body mass index is 29.89 kg/m. Physical Exam Constitutional:  Appearance: Normal appearance.  HENT:     Head: Normocephalic and atraumatic.  Cardiovascular:     Rate and Rhythm: Normal rate. Rhythm irregular.     Pulses: Normal pulses.     Heart sounds: Normal heart sounds.  Pulmonary:     Effort: No respiratory distress.     Breath sounds: No stridor. No wheezing or rales.  Abdominal:     General: Bowel sounds are normal. There is no distension.     Palpations: Abdomen is soft.     Tenderness: There is no abdominal tenderness. There is no right CVA tenderness or guarding.  Musculoskeletal:        General: No swelling.  Skin:    Comments: Excoriations on left calf   Neurological:     Mental Status: He is alert. Mental status is at baseline.     Sensory: No sensory deficit.     Motor: No weakness.     Labs reviewed: Recent Labs    07/03/22 0816 07/04/22 1101 07/05/22 0309 07/06/22 0406 08/04/22 0000 02/09/23 0000 02/18/23 0000 05/07/23 0000 05/13/23 0000 05/25/23 0000  NA 134* 135 134* 135   < > 138 135* 138  --  137  K 4.4 4.1 4.2 3.8   < > 4.8 4.7 4.6 4.8 4.8  CL 102 106 103 105   < > 105 102 105  --  103  CO2 23 21* 21* 22   < > 25* 26* 26*  --  27*  GLUCOSE 92 113* 90 88  --   --   --   --   --   --   BUN 13 15 15 15    < > 22* 24* 23*  --  21  CREATININE  1.20 1.14 1.19 1.11   < > 1.2 1.1 1.2  --   --   CALCIUM 9.6 9.1 9.1 9.1   < > 10.7 10.5 10.2  --  10.7  MG 1.9 1.9  --  1.9  --   --   --   --   --   --   PHOS 2.8  --   --  2.6  --   --   --   --   --   --    < > = values in this interval not displayed.   Recent Labs    07/04/22 1101 07/05/22 0309 07/06/22 0406 07/16/22 0000 02/09/23 0000 02/18/23 0000 05/25/23 0000  AST 105* 87* 64*   < > 21 17 19   ALT 38 38 38   < > 14 9* 13  ALKPHOS 55 57 58   < > 82 77 84  BILITOT 1.4* 1.5* 1.5*  --   --   --   --   PROT 6.5 6.5 6.4*  --   --   --   --   ALBUMIN 3.3* 3.2* 3.1*   < > 4.4 3.9 3.8   < > = values in this interval not displayed.   Recent Labs    07/03/22 0816 07/04/22 1101 07/16/22 0000 02/09/23 0000 02/18/23 0000 05/25/23 0000  WBC 10.7* 8.9   < > 7.5 6.9 10.6  NEUTROABS  --   --    < > 5,415.00 4,637.00 7,590.00  HGB 16.8 15.9   < > 13.1* 12.9* 11.9*  HCT 49.6 46.8   < > 39* 39* 36*  MCV 92.4 91.9  --   --   --   --   PLT 171 187   < >  287 247 322   < > = values in this interval not displayed.   Lab Results  Component Value Date   TSH 1.42 02/09/2023   Lab Results  Component Value Date   HGBA1C  08/24/2009    5.7 (NOTE) The ADA recommends the following therapeutic goal for glycemic control related to Hgb A1c measurement: Goal of therapy: <6.5 Hgb A1c  Reference: American Diabetes Association: Clinical Practice Recommendations 2010, Diabetes Care, 2010, 33: (Suppl  1).   Lab Results  Component Value Date   CHOL 116 12/03/2022   HDL 46 12/03/2022   LDLCALC 54 12/03/2022   LDLDIRECT 55 09/02/2020   TRIG 80 12/03/2022   CHOLHDL 3.3 09/02/2020    Significant Diagnostic Results in last 30 days:  No results found.  Assessment/Plan  1. Major neurocognitive disorder (HCC) (Primary) Cont with supportive care Cont with aricept  Functional Assessment Staging Scale: 6d - Urinary incontinence occasionally    2. Primary hypertension Pt denies feeling dizzy   Cont with metoprolol    3. AF (paroxysmal atrial fibrillation) (HCC) No signs of bleeding  Cont with eliquis   4. Chronic combined systolic and diastolic congestive heart failure (HCC) Lungs clear  Cont with lasix   5. Primary osteoarthritis involving multiple joints Cont with norco prn    6. Stage 3a chronic kidney disease (HCC) Avoid neprotoxic meds  Cr 1.23   7. Neuropathy Pt reports  that burning sensation improved Will increase gabapentin to 200 mg daily at bedtime   8. Benign prostatic hyperplasia with nocturia Will start flomax     9. Constipation, unspecified constipation type Increase oral hydration and fiber intake  Will start colace twice daily  Pruritus Pt c/o itching on the left calf  Will start kenalog  cream 0.1 % twice daily   Bipolar disorder Follow up with psychiatry Cont with lithium     Other orders - gabapentin (NEURONTIN) 100 MG capsule; Take 100 mg by mouth at bedtime. - triamcinolone  cream (KENALOG ) 0.1 %; Apply 1 Application topically 2 (two) times daily. - tamsulosin  (FLOMAX ) 0.4 MG CAPS capsule; Take 1 capsule (0.4 mg total) by mouth daily. - docusate sodium  (COLACE) 100 MG capsule; Take 1 capsule (100 mg total) by mouth 2 (two) times daily.   Care plan discussed with the nursing staff  30 min Total time spent for obtaining history,  performing a medically appropriate examination and evaluation, reviewing the tests,   documenting clinical information in the electronic or other health record, independently interpreting results ,care coordination (not separately reported)

## 2023-07-05 ENCOUNTER — Encounter: Payer: Self-pay | Admitting: Sports Medicine

## 2023-07-05 MED ORDER — TRIAMCINOLONE ACETONIDE 0.1 % EX CREA: 1.0000 | TOPICAL_CREAM | Freq: Two times a day (BID) | CUTANEOUS | Status: DC

## 2023-07-05 MED ORDER — TAMSULOSIN HCL 0.4 MG PO CAPS: 0.4000 mg | ORAL_CAPSULE | Freq: Every day | ORAL | Status: AC

## 2023-07-05 MED ORDER — DOCUSATE SODIUM 100 MG PO CAPS: 100.0000 mg | ORAL_CAPSULE | Freq: Two times a day (BID) | ORAL | Status: AC

## 2023-07-13 DIAGNOSIS — L570 Actinic keratosis: Secondary | ICD-10-CM | POA: Diagnosis not present

## 2023-07-13 DIAGNOSIS — I872 Venous insufficiency (chronic) (peripheral): Secondary | ICD-10-CM | POA: Diagnosis not present

## 2023-07-15 ENCOUNTER — Non-Acute Institutional Stay (SKILLED_NURSING_FACILITY): Payer: Medicare Other | Admitting: Nurse Practitioner

## 2023-07-15 ENCOUNTER — Encounter: Payer: Self-pay | Admitting: Nurse Practitioner

## 2023-07-15 DIAGNOSIS — I10 Essential (primary) hypertension: Secondary | ICD-10-CM | POA: Diagnosis not present

## 2023-07-15 DIAGNOSIS — E871 Hypo-osmolality and hyponatremia: Secondary | ICD-10-CM | POA: Diagnosis not present

## 2023-07-15 DIAGNOSIS — I4821 Permanent atrial fibrillation: Secondary | ICD-10-CM

## 2023-07-15 DIAGNOSIS — R4189 Other symptoms and signs involving cognitive functions and awareness: Secondary | ICD-10-CM

## 2023-07-15 DIAGNOSIS — R1110 Vomiting, unspecified: Secondary | ICD-10-CM | POA: Diagnosis not present

## 2023-07-15 DIAGNOSIS — K6389 Other specified diseases of intestine: Secondary | ICD-10-CM | POA: Diagnosis not present

## 2023-07-15 DIAGNOSIS — R1010 Upper abdominal pain, unspecified: Secondary | ICD-10-CM | POA: Diagnosis not present

## 2023-07-15 DIAGNOSIS — Z66 Do not resuscitate: Secondary | ICD-10-CM

## 2023-07-15 DIAGNOSIS — N1831 Chronic kidney disease, stage 3a: Secondary | ICD-10-CM | POA: Diagnosis not present

## 2023-07-15 DIAGNOSIS — R001 Bradycardia, unspecified: Secondary | ICD-10-CM

## 2023-07-15 DIAGNOSIS — I5042 Chronic combined systolic (congestive) and diastolic (congestive) heart failure: Secondary | ICD-10-CM

## 2023-07-15 LAB — HEPATIC FUNCTION PANEL
ALT: 10 U/L (ref 10–40)
AST: 20 (ref 14–40)
Alkaline Phosphatase: 82 (ref 25–125)
Bilirubin, Total: 0.6

## 2023-07-15 LAB — BASIC METABOLIC PANEL
BUN: 24 — AB (ref 4–21)
CO2: 27 — AB (ref 13–22)
Chloride: 103 (ref 99–108)
Creatinine: 1.1 (ref 0.6–1.3)
Glucose: 124
Potassium: 4.3 meq/L (ref 3.5–5.1)
Sodium: 139 (ref 137–147)

## 2023-07-15 LAB — CBC AND DIFFERENTIAL
HCT: 34 — AB (ref 41–53)
Hemoglobin: 11.1 — AB (ref 13.5–17.5)
Neutrophils Absolute: 8199
Platelets: 296 10*3/uL (ref 150–400)
WBC: 9.1

## 2023-07-15 LAB — COMPREHENSIVE METABOLIC PANEL
Albumin: 3.7 (ref 3.5–5.0)
Calcium: 10.2 (ref 8.7–10.7)
Globulin: 3

## 2023-07-15 LAB — CBC: RBC: 3.7 — AB (ref 3.87–5.11)

## 2023-07-15 NOTE — Assessment & Plan Note (Signed)
Bun/creat 21/1.2 05/25/23

## 2023-07-15 NOTE — Assessment & Plan Note (Signed)
Na 137 05/25/23

## 2023-07-15 NOTE — Assessment & Plan Note (Signed)
 followed by psychiatry, on Lithium, TSH 1.42 02/09/23, 07/16/22 Lithium level 0.6(0.6-1.2)             Insomnia, takes Trazodone since 02/17/23 by psych

## 2023-07-15 NOTE — Assessment & Plan Note (Addendum)
decreased Metoprolol 25/50mg  06/08/23 2/2 bradycardia, decreased 50/100mg  06/02/23(switched to Metoprolol from Diltiazem 04/09/23 2/2 to edema BLE) 07/19/23 HR 50bpm, asymptomatic, decrease Metoprolol 12.5mg /25mg  qd, f/u Cardiology ASAP

## 2023-07-15 NOTE — Assessment & Plan Note (Addendum)
off Diltiazem 04/09/23 2/2 edema), decreased Metoprolol to 25mg  06/08/23 2/2 bradycardia, taking  Eliquis 07/19/23 HR 50, decrease Metoprolol 12.5mg /25mg  every day, Cardiology ASAP

## 2023-07-15 NOTE — Progress Notes (Unsigned)
Location:  [facility] Room Number: [phone] A Place of Service:  SNF (31) Provider:  Chipper Oman, NP   Patient Care Team: Georgeanna Lea, MD as PCP - Cardiology (Cardiology)  Extended Emergency Contact Information Primary Emergency Contact: Matsumura,[name] Address: [address]           [facility] [address]          [place], [place] [address] [name] Home Phone: [phone] Mobile Phone: [phone] Relation: Spouse Secondary Emergency Contact: [name] Address: [address].          [place], [place] [address] [place] Home Phone: [phone] Mobile Phone: [phone] Relation: Son  Code Status:  DNR Goals of care: Advanced Directive information    07/15/2023   10:49 AM  Advanced Directives  Does Patient Have a Medical Advance Directive? Yes  Type of Advance Directive Out of facility DNR (pink MOST or yellow form)  Does patient want to make changes to medical advance directive? No - Patient declined  Pre-existing out of facility DNR order (yellow form or pink MOST form) Yellow form placed in chart (order not valid for inpatient use)     Chief Complaint  Patient presents with   Acute Visit    Vomiting     HPI:  Pt is a 89 y.o. male seen today for an acute visit for vomited x 4 time since last night, denied abd pain or diarrhea, last BM last night.     HOH L>R, HPOA-wife declined hearing aids.              Parkinson's disease, stopped parkinson's medication because of affected his cognition, had resting tremor in left arm/leg in the past             OP 12/01/22 dc'd Alendronate tscore -2.9 2017, -0.9 01/19/19, refused DEXA 11/02/22             OA shoulders pain, worsens at night, limited abduction ROM over shoulder level, hx of neck clicks with ROM, aches, c/o right sciatica pain, worsens in am, chronic left knee pain,  Norco is effective.              Cognitive impairment, SNF [facility] for supportive  care, 07/16/22 MMSE 18/30, taking Deonepezil sine 8/21/2 by Psych             CVA, no focal weakness residual.              Afib, off Diltiazem 04/09/23 2/2 edema), decreased Metoprolol to 25mg  06/08/23 2/2 bradycardia, taking  Eliquis             Bradycardia, decreased Metoprolol 25/50mg  06/08/23 2/2 bradycardia, decreased 50/100mg  06/02/23(switched to Metoprolol from Diltiazem 04/09/23 2/2 to edema BLE)             Bipolar disorder, followed by psychiatry, on Lithium, TSH 1.42 02/09/23, 07/16/22 Lithium level 0.6(0.6-1.2)             Insomnia, takes Trazodone since 02/17/23 by psych             CHF, EF 45-50% 11/2021, taking Furosemide, trace edema BLE, 05/10/23 CXR-mild pulmonary venous congestion, no infiltrate             Gait abnormality, uses w/c for mobility mostly.              Hyponatremia, Na 137 05/25/23             Hyperbilirubinemia, bilirubin  0.7 05/25/23            Polycythemia,  Hgb 11.9 05/25/23             CKD, Bun/creat 21/1.2 05/25/23             Hypogonadism, hx of  testosterone inj             Hyperlipidemia, takes Pravastatin             Glaucoma, eye drops.      Past Medical History:  Diagnosis Date   AF (paroxysmal atrial fibrillation) (HCC) 02/16/2018   Atrial fibrillation (HCC)    Atrial fibrillation with RVR (HCC) 02/16/2018   Bipolar 1 disorder (HCC)    Bipolar disorder (HCC) 02/17/2018   Dizziness 02/26/2020   DJD (degenerative joint disease)    Dyslipidemia 02/26/2020   Gait instability 02/16/2018   Hypertension    Mitral regurgitation    mild    Polycythemia 02/16/2018   Renal insufficiency    Stroke [facility])    Tremor 02/16/2018   Tremors of nervous system 01/2018   Past Surgical History:  Procedure Laterality Date   SHOULDER SURGERY      Allergies  Allergen Reactions   Lipitor [Atorvastatin] Other (See Comments)    Joint soreness    Outpatient Encounter Medications as of 07/15/2023  Medication Sig   alum & mag hydroxide-simeth (MAALOX PLUS)  400-400-40 MG/5ML suspension Take 30 mLs by mouth every 4 (four) hours as needed for indigestion.   ascorbic acid (VITAMIN C) 500 MG tablet Take 500 mg by mouth daily.   Calcium Carbonate-Vit D-Min (CALCIUM 600+D PLUS MINERALS) 600-400 MG-UNIT CHEW Chew 1 Piece by mouth in the morning and at bedtime.   docusate sodium (COLACE) 100 MG capsule Take 1 capsule (100 mg total) by mouth 2 (two) times daily.   donepezil (ARICEPT) 5 MG tablet Take 5 mg by mouth at bedtime.   ELIQUIS 5 MG TABS tablet TAKE 1 TABLET BY MOUTH TWICE DAILY.   furosemide (LASIX) 20 MG tablet Take 20 mg by mouth daily.   gabapentin (NEURONTIN) 100 MG capsule Take 200 mg by mouth at bedtime.   HYDROcodone-acetaminophen (NORCO/VICODIN) 5-325 MG tablet Take 0.5 tablets by mouth in the morning and at bedtime.   latanoprost (XALATAN) 0.005 % ophthalmic solution Place 1 drop into both eyes at bedtime.   lithium carbonate (LITHOBID) 300 MG ER tablet Take 1 tablet (300 mg total) by mouth every evening.   metoprolol succinate (TOPROL-XL) 25 MG 24 hr tablet Take 25 mg by mouth daily.   Multiple Vitamin (MULTIVITAMIN WITH MINERALS) TABS tablet Take 1 tablet by mouth daily.   ondansetron (ZOFRAN) 4 MG tablet Take 4 mg by mouth 3 (three) times daily with meals. X 3 days for nausea and vomiting   Polyethyl Glycol-Propyl Glycol (SYSTANE) 0.4-0.3 % SOLN Apply 1 drop to eye every 4 (four) hours as needed (dry eyes). And four times a day scheduled   potassium chloride (MICRO-K) 10 MEQ CR capsule Take 10 mEq by mouth daily.   pravastatin (PRAVACHOL) 20 MG tablet Take 20 mg by mouth at bedtime.   sodium chloride (MURO 128) 5 % ophthalmic solution Place 1 drop into both eyes at bedtime.   tamsulosin (FLOMAX) 0.4 MG CAPS capsule Take 1 capsule (0.4 mg total) by mouth daily.   timolol (TIMOPTIC) 0.5 % ophthalmic solution Place 1 drop into both eyes daily.   traZODone (DESYREL) 50 MG tablet Take 1 tablet (50 mg total) by  mouth at bedtime.    triamcinolone cream (KENALOG) 0.1 % Apply 1 Application topically 2 (two) times daily.   donepezil (ARICEPT) 10 MG tablet Take 10 mg by mouth at bedtime. (Patient not taking: Reported on 07/15/2023)   [DISCONTINUED] metoprolol succinate (TOPROL-XL) 100 MG 24 hr tablet Take 100 mg by mouth daily. (Patient not taking: Reported on 07/15/2023)   No facility-administered encounter medications on file as of 07/15/2023.    Review of Systems  Constitutional:  Positive for appetite change and fatigue. Negative for fever.  HENT:  Positive for hearing loss. Negative for congestion and trouble swallowing.   Eyes:  Negative for visual disturbance.  Respiratory:  Negative for cough, chest tightness, shortness of breath and wheezing.   Cardiovascular:  Positive for leg swelling.  Gastrointestinal:  Positive for nausea and vomiting. Negative for abdominal distention, abdominal pain, constipation and diarrhea.  Genitourinary:  Negative for dysuria and urgency.       Incontinent of urine.   Musculoskeletal:  Positive for arthralgias, back pain and gait problem.       Shoulders pain with above shoulder level abduction ROM, worsens at night. R sciatic pain, not new, worse in am when getting up, chronic left knee pain  Skin:  Negative for color change.  Neurological:  Negative for speech difficulty, weakness and headaches.  Psychiatric/Behavioral:  Positive for confusion and sleep disturbance. The patient is not nervous/anxious.        Early am awake, chronic    Immunization History  Administered Date(s) Administered   Hepatitis B, ADULT 11/26/1997, 12/27/1997, 05/08/1998   Influenza, High Dose Seasonal PF 03/31/2023   Influenza-Unspecified 04/15/2022   Moderna Covid-19 Vaccine Bivalent Booster 10yrs & up 04/14/2023   PPD Test 07/08/2022   Pneumococcal Conjugate-13 04/26/2014   Pneumococcal Polysaccharide-23 04/14/2005   RSV,unspecified 08/19/2022   Td 08/04/2022   Tdap 05/18/2016   Zoster  Recombinant(Shingrix) 07/29/2022, 10/28/2022   Pertinent  Health Maintenance Due  Topic Date Due   INFLUENZA VACCINE  Completed      07/08/2022    9:22 AM 07/10/2022   11:13 AM 08/03/2022    4:30 PM 08/25/2022   11:49 AM 09/24/2022    1:55 PM  Fall Risk  Falls in the past year?  1 1 1  0  Was there an injury with Fall?  1 1 0 0  Fall Risk Category Calculator  3 3 1  0  Fall Risk Category (Retired)  High     (RETIRED) Patient Fall Risk Level High fall risk High fall risk     Patient at Risk for Falls Due to  History of fall(s);Impaired balance/gait;Impaired mobility History of fall(s);Impaired balance/gait;Impaired mobility No Fall Risks   Fall risk Follow up  Falls evaluation completed Falls evaluation completed Falls evaluation completed    Functional Status Survey:    Vitals:   07/15/23 1047 07/19/23 1130  BP: (!) 108/48 (!) 115/52  Pulse: (!) 49 (!) 50  Temp: (!) 97.3 F (36.3 C)   SpO2: 99%   Weight: 185 lb 3.2 oz (84 kg)   Height: 5\' 6"  (1.676 m)    Body mass index is 29.89 kg/m. Physical Exam Vitals and nursing note reviewed.  Constitutional:      Appearance: He is ill-appearing.  HENT:     Head: Normocephalic and atraumatic.     Nose: Nose normal.     Mouth/Throat:     Mouth: Mucous membranes are moist.  Eyes:     Extraocular Movements: Extraocular movements intact.  Conjunctiva/sclera: Conjunctivae normal.     Pupils: Pupils are equal, round, and reactive to light.  Cardiovascular:     Rate and Rhythm: Bradycardia present. Rhythm irregular.     Heart sounds: No murmur heard. Pulmonary:     Effort: Pulmonary effort is normal.     Breath sounds: No rales.  Abdominal:     General: Bowel sounds are normal.     Palpations: Abdomen is soft.     Tenderness: There is no abdominal tenderness. There is no right CVA tenderness, left CVA tenderness, guarding or rebound.  Musculoskeletal:        General: Tenderness present.     Cervical back: Normal range of motion  and neck supple.     Right lower leg: Edema present.     Left lower leg: Edema present.     Comments: trace edema BLE. Shoulders pain with above shoulder level abduction ROM, worsens at night, R sciatica pain worse in am/getting out of bed, chronic left knee pain  Skin:    General: Skin is warm and dry.     Comments: The right 5th toe nail yellow thick, no trauma/injury, no redness or warmth of the toe.   Neurological:     General: No focal deficit present.     Mental Status: He is alert and oriented to person, place, and time. Mental status is at baseline.     Motor: No weakness.     Gait: Gait abnormal.  Psychiatric:        Mood and Affect: Mood normal.        Behavior: Behavior normal.        Thought Content: Thought content normal.     Labs reviewed: Recent Labs    02/09/23 0000 02/18/23 0000 05/07/23 0000 05/13/23 0000 05/25/23 0000  NA 138 135* 138  --  137  K 4.8 4.7 4.6 4.8 4.8  CL 105 102 105  --  103  CO2 25* 26* 26*  --  27*  BUN 22* 24* 23*  --  21  CREATININE 1.2 1.1 1.2  --   --   CALCIUM 10.7 10.5 10.2  --  10.7   Recent Labs    02/09/23 0000 02/18/23 0000 05/25/23 0000  AST 21 17 19   ALT 14 9* 13  ALKPHOS 82 77 84  ALBUMIN 4.4 3.9 3.8   Recent Labs    02/09/23 0000 02/18/23 0000 05/25/23 0000  WBC 7.5 6.9 10.6  NEUTROABS 5,415.00 4,637.00 7,590.00  HGB 13.1* 12.9* 11.9*  HCT 39* 39* 36*  PLT 287 247 322   Lab Results  Component Value Date   TSH 1.42 02/09/2023   Lab Results  Component Value Date   HGBA1C  08/24/2009    5.7 (NOTE) The ADA recommends the following therapeutic goal for glycemic control related to Hgb A1c measurement: Goal of therapy: <6.5 Hgb A1c  Reference: American Diabetes Association: Clinical Practice Recommendations 2010, Diabetes Care, 2010, 33: (Suppl  1).   Lab Results  Component Value Date   CHOL 116 12/03/2022   HDL 46 12/03/2022   LDLCALC 54 12/03/2022   LDLDIRECT 55 09/02/2020   TRIG 80 12/03/2022    CHOLHDL 3.3 09/02/2020    Significant Diagnostic Results in last 30 days:  No results found.  Assessment/Plan Vomiting Zofran 4mg  with meals x 72hrs, hold Furosemide/Kcl until GI symptoms subsided.  CBC/diff, CMP/eGFR, lipase, Amylase, X-ray abd 07/15/23 no acute abd process.  VS q shift x 72hrs Observe.  07/15/23  wbc 9.1, Hgb 11.1, plt 296, neutrophils 90.1, amylase 89, lipase 47, Na 139, K 4.3, Bun 24, creat 1.14  Chronic CHF (HCC) EF 45-50% 11/2021, hold Furosemide until GI symptoms subsided, trace edema BLE, 05/10/23 CXR-mild pulmonary venous congestion, no infiltrate  Hyponatremia Na 137 05/25/23  CKD (chronic kidney disease) stage 3, GFR 30-59 ml/min (HCC) Bun/creat 21/1.2 05/25/23  Bipolar disorder (HCC) followed by psychiatry, on Lithium, TSH 1.42 02/09/23, 07/16/22 Lithium level 0.6(0.6-1.2)             Insomnia, takes Trazodone since 02/17/23 by psych  Bradycardia  decreased Metoprolol 25/50mg  06/08/23 2/2 bradycardia, decreased 50/100mg  06/02/23(switched to Metoprolol from Diltiazem 04/09/23 2/2 to edema BLE) 07/19/23 HR 50bpm, asymptomatic, decrease Metoprolol 12.5mg /25mg  qd, f/u Cardiology ASAP  Atrial fibrillation (HCC) off Diltiazem 04/09/23 2/2 edema), decreased Metoprolol to 25mg  06/08/23 2/2 bradycardia, taking  Eliquis 07/19/23 HR 50, decrease Metoprolol 12.5mg /25mg  every day, Cardiology ASAP  Cognitive impairment  SNF [facility] for supportive care, 07/16/22 MMSE 18/30, taking Deonepezil sine 8/21/2 by Psych  Osteoarthritis, multiple sites worsens at night, limited abduction ROM over shoulder level, hx of neck clicks with ROM, aches, c/o right sciatica pain, worsens in am, chronic left knee pain,  Norco is effective. Saw Ortho 06/14/23  Hypertension Runs low, HR in 40-50s, will decrease  Metoprolol 12.5mg /25mg  every day,  VS q day, Cardiology ASAP     Family/ staff Communication: plan of care reviewed with the patient and charge nurse.   Labs/tests ordered:   CBC/diff, CMP/eGFR, Amylase, Lipase  Time spend 30 minutes.

## 2023-07-15 NOTE — Assessment & Plan Note (Signed)
worsens at night, limited abduction ROM over shoulder level, hx of neck clicks with ROM, aches, c/o right sciatica pain, worsens in am, chronic left knee pain,  Norco is effective. Saw Ortho 06/14/23

## 2023-07-15 NOTE — Assessment & Plan Note (Signed)
EF 45-50% 11/2021, hold Furosemide until GI symptoms subsided, trace edema BLE, 05/10/23 CXR-mild pulmonary venous congestion, no infiltrate

## 2023-07-15 NOTE — Assessment & Plan Note (Addendum)
Zofran 4mg  with meals x 72hrs, hold Furosemide/Kcl until GI symptoms subsided.  CBC/diff, CMP/eGFR, lipase, Amylase, X-ray abd 07/15/23 no acute abd process.  VS q shift x 72hrs Observe.  07/15/23 wbc 9.1, Hgb 11.1, plt 296, neutrophils 90.1, amylase 89, lipase 47, Na 139, K 4.3, Bun 24, creat 1.14

## 2023-07-15 NOTE — Assessment & Plan Note (Signed)
SNF [facility] for supportive care, 07/16/22 MMSE 18/30, taking Deonepezil sine 8/21/2 by Merilyn Baba

## 2023-07-19 ENCOUNTER — Encounter: Payer: Self-pay | Admitting: Nurse Practitioner

## 2023-07-19 NOTE — Assessment & Plan Note (Addendum)
Runs low, HR in 40-50s, will decrease  Metoprolol 12.5mg /25mg  every day,  VS q day, Cardiology ASAP

## 2023-07-27 ENCOUNTER — Encounter: Payer: Self-pay | Admitting: Cardiology

## 2023-07-27 ENCOUNTER — Ambulatory Visit: Payer: Medicare Other | Attending: Cardiology | Admitting: Cardiology

## 2023-07-27 ENCOUNTER — Encounter: Payer: Self-pay | Admitting: Nurse Practitioner

## 2023-07-27 ENCOUNTER — Non-Acute Institutional Stay: Payer: Medicare Other | Admitting: Nurse Practitioner

## 2023-07-27 ENCOUNTER — Ambulatory Visit: Payer: Medicare Other | Attending: Cardiology

## 2023-07-27 VITALS — BP 114/54 | HR 56 | Ht 66.0 in | Wt 170.0 lb

## 2023-07-27 DIAGNOSIS — I340 Nonrheumatic mitral (valve) insufficiency: Secondary | ICD-10-CM

## 2023-07-27 DIAGNOSIS — I4821 Permanent atrial fibrillation: Secondary | ICD-10-CM | POA: Diagnosis not present

## 2023-07-27 DIAGNOSIS — Z0000 Encounter for general adult medical examination without abnormal findings: Secondary | ICD-10-CM

## 2023-07-27 DIAGNOSIS — I5042 Chronic combined systolic (congestive) and diastolic (congestive) heart failure: Secondary | ICD-10-CM | POA: Diagnosis not present

## 2023-07-27 DIAGNOSIS — R0609 Other forms of dyspnea: Secondary | ICD-10-CM

## 2023-07-27 DIAGNOSIS — I420 Dilated cardiomyopathy: Secondary | ICD-10-CM | POA: Diagnosis not present

## 2023-07-27 NOTE — Patient Instructions (Addendum)
Medication Instructions:  Your physician recommends that you continue on your current medications as directed. Please refer to the Current Medication list given to you today.  *If you need a refill on your cardiac medications before your next appointment, please call your pharmacy*   Lab Work: None Ordered If you have labs (blood work) drawn today and your tests are completely normal, you will receive your results only by: MyChart Message (if you have MyChart) OR A paper copy in the mail If you have any lab test that is abnormal or we need to change your treatment, we will call you to review the results.   Testing/Procedures: Your physician has requested that you have an echocardiogram. Echocardiography is a painless test that uses sound waves to create images of your heart. It provides your doctor with information about the size and shape of your heart and how well your heart's chambers and valves are working. This procedure takes approximately one hour. There are no restrictions for this procedure. Please do NOT wear cologne, perfume, aftershave, or lotions (deodorant is allowed). Please arrive 15 minutes prior to your appointment time.  Please note: We ask at that you not bring children with you during ultrasound (echo/ vascular) testing. Due to room size and safety concerns, children are not allowed in the ultrasound rooms during exams. Our front office staff cannot provide observation of children in our lobby area while testing is being conducted. An adult accompanying a patient to their appointment will only be allowed in the ultrasound room at the discretion of the ultrasound technician under special circumstances. We apologize for any inconvenience.   WHY IS MY DOCTOR PRESCRIBING ZIO? The Zio system is proven and trusted by physicians to detect and diagnose irregular heart rhythms -- and has been prescribed to hundreds of thousands of patients.  The FDA has cleared the Zio system to  monitor for many different kinds of irregular heart rhythms. In a study, physicians were able to reach a diagnosis 90% of the time with the Zio system1.  You can wear the Zio monitor -- a small, discreet, comfortable patch -- during your normal day-to-day activity, including while you sleep, shower, and exercise, while it records every single heartbeat for analysis.  1Barrett, P., et al. Comparison of 24 Hour Holter Monitoring Versus 14 Day Novel Adhesive Patch Electrocardiographic Monitoring. American Journal of Medicine, 2014.  ZIO VS. HOLTER MONITORING The Zio monitor can be comfortably worn for up to 14 days. Holter monitors can be worn for 24 to 48 hours, limiting the time to record any irregular heart rhythms you may have. Zio is able to capture data for the 51% of patients who have their first symptom-triggered arrhythmia after 48 hours.1  LIVE WITHOUT RESTRICTIONS The Zio ambulatory cardiac monitor is a small, unobtrusive, and water-resistant patch--you might even forget you're wearing it. The Zio monitor records and stores every beat of your heart, whether you're sleeping, working out, or showering.     Follow-Up: At [facility], you and your health needs are our priority.  As part of our continuing mission to provide you with exceptional heart care, we have created designated Provider Care Teams.  These Care Teams include your primary Cardiologist (physician) and Advanced Practice Providers (APPs -  Physician Assistants and Nurse Practitioners) who all work together to provide you with the care you need, when you need it.  We recommend signing up for the patient portal called "MyChart".  Sign up information is provided on this After  Visit Summary.  MyChart is used to connect with patients for Virtual Visits (Telemedicine).  Patients are able to view lab/test results, encounter notes, upcoming appointments, etc.  Non-urgent messages can be sent to your provider as well.   To learn more  about what you can do with MyChart, go to [email or web address].    Your next appointment:   6 month(s)  The format for your next appointment:   In Person  Provider:   Gypsy Balsam, MD    Other Instructions NA

## 2023-07-27 NOTE — Addendum Note (Signed)
Addended by: Baldo Ash D on: 07/27/2023 11:43 AM   Modules accepted: Orders

## 2023-07-27 NOTE — Progress Notes (Signed)
Cardiology Office Note:    Date:  07/27/2023   ID:  Sean Bishop, DOB 10/08/1932, MRN [id number]  PCP:  No primary care provider on file.  Cardiologist:  Gypsy Balsam, MD    Referring MD: Moshe Cipro, FNP   Chief Complaint  Patient presents with   Bradycardia   yearly follow    History of Present Illness:    Sean Bishop is a 89 y.o. male past medical history significant for paroxysmal atrial fibrillation, bipolar disorder, essential hypertension, dyslipidemia, cardiomyopathy with ejection fraction 45% however last echogram from last year showed improvement left ventricle ejection fraction, also mild to moderate mitral stenosis. Comes today to months for follow-up he said he is doing fine denies have any chest pain tightness squeezing pressure burning chest no shortness of breath concerns about bradycardia he was noted to be bradycardic last time we cut down significantly his beta-blocker in spite of that still bradycardic but denies have any dizziness or passing out  Past Medical History:  Diagnosis Date   AF (paroxysmal atrial fibrillation) (HCC) 02/16/2018   Atrial fibrillation (HCC)    Atrial fibrillation with RVR (HCC) 02/16/2018   Bipolar 1 disorder (HCC)    Bipolar disorder (HCC) 02/17/2018   Dizziness 02/26/2020   DJD (degenerative joint disease)    Dyslipidemia 02/26/2020   Gait instability 02/16/2018   Hypertension    Mitral regurgitation    mild    Polycythemia 02/16/2018   Renal insufficiency    Stroke [facility])    Tremor 02/16/2018   Tremors of nervous system 01/2018    Past Surgical History:  Procedure Laterality Date   SHOULDER SURGERY      Current Medications: Current Meds  Medication Sig   ascorbic acid (VITAMIN C) 500 MG tablet Take 500 mg by mouth daily.   Calcium Carbonate-Vit D-Min (CALCIUM 600+D PLUS MINERALS) 600-400 MG-UNIT CHEW Chew 1 Piece by mouth in the morning and at bedtime.   docusate sodium (COLACE) 100 MG capsule Take 1 capsule  (100 mg total) by mouth 2 (two) times daily.   donepezil (ARICEPT) 5 MG tablet Take 5 mg by mouth at bedtime.   ELIQUIS 5 MG TABS tablet TAKE 1 TABLET BY MOUTH TWICE DAILY. (Patient taking differently: Take 5 mg by mouth 2 (two) times daily.)   furosemide (LASIX) 20 MG tablet Take 20 mg by mouth daily.   gabapentin (NEURONTIN) 100 MG capsule Take 200 mg by mouth at bedtime.   HYDROcodone-acetaminophen (NORCO/VICODIN) 5-325 MG tablet Take 0.5 tablets by mouth in the morning and at bedtime.   latanoprost (XALATAN) 0.005 % ophthalmic solution Place 1 drop into both eyes at bedtime.   lithium carbonate (LITHOBID) 300 MG ER tablet Take 1 tablet (300 mg total) by mouth every evening.   metoprolol succinate (TOPROL-XL) 25 MG 24 hr tablet Take 12.5 mg by mouth daily.   Multiple Vitamin (MULTIVITAMIN WITH MINERALS) TABS tablet Take 1 tablet by mouth daily.   Polyethyl Glycol-Propyl Glycol (SYSTANE) 0.4-0.3 % SOLN Apply 1 drop to eye every 4 (four) hours as needed (dry eyes). And four times a day scheduled   potassium chloride (MICRO-K) 10 MEQ CR capsule Take 10 mEq by mouth daily.   pravastatin (PRAVACHOL) 20 MG tablet Take 20 mg by mouth at bedtime.   sodium chloride (MURO 128) 5 % ophthalmic solution Place 1 drop into both eyes at bedtime.   tamsulosin (FLOMAX) 0.4 MG CAPS capsule Take 1 capsule (0.4 mg total) by mouth daily. (Patient taking differently:  Take 0.4 mg by mouth at bedtime.)   traZODone (DESYREL) 50 MG tablet Take 1 tablet (50 mg total) by mouth at bedtime.   triamcinolone cream (KENALOG) 0.1 % Apply 1 Application topically 2 (two) times daily.   [DISCONTINUED] alum & mag hydroxide-simeth (MAALOX PLUS) 400-400-40 MG/5ML suspension Take 30 mLs by mouth every 4 (four) hours as needed for indigestion.   [DISCONTINUED] donepezil (ARICEPT) 10 MG tablet Take 10 mg by mouth at bedtime.   [DISCONTINUED] ondansetron (ZOFRAN) 4 MG tablet Take 4 mg by mouth 3 (three) times daily with meals. X 3 days  for nausea and vomiting   [DISCONTINUED] timolol (TIMOPTIC) 0.5 % ophthalmic solution Place 1 drop into both eyes daily.     Allergies:   Lipitor [atorvastatin]   Social History   Socioeconomic History   Marital status: Married    Spouse name: Not on file   Number of children: Not on file   Years of education: Not on file   Highest education level: Not on file  Occupational History   Not on file  Tobacco Use   Smoking status: Former   Smokeless tobacco: Never  Vaping Use   Vaping status: Never Used  Substance and Sexual Activity   Alcohol use: Yes    Alcohol/week: 1.0 standard drink of alcohol    Types: 1 Glasses of wine per week    Comment: 1 per month   Drug use: No   Sexual activity: Not on file  Other Topics Concern   Not on file  Social History Narrative   Not on file   Social Drivers of Health   Financial Resource Strain: Not on file  Food Insecurity: No Food Insecurity (07/02/2022)   Hunger Vital Sign    Worried About Running Out of Food in the Last Year: Never true    Ran Out of Food in the Last Year: Never true  Transportation Needs: No Transportation Needs (07/02/2022)   PRAPARE - Administrator, Civil Service (Medical): No    Lack of Transportation (Non-Medical): No  Physical Activity: Not on file  Stress: Not on file  Social Connections: Not on file     Family History: The patient's family history includes Alcohol abuse in his mother; Cancer in his father. ROS:   Please see the history of present illness.    All 14 point review of systems negative except as described per history of present illness  EKGs/Labs/Other Studies Reviewed:    EKG Interpretation Date/Time:  Tuesday July 27 2023 10:54:39 EST Ventricular Rate:  53 PR Interval:  248 QRS Duration:  86 QT Interval:  420 QTC Calculation: 394 R Axis:   6  Text Interpretation: Sinus bradycardia with 1st degree A-V block When compared with ECG of 01-Jul-2022 22:12, PREVIOUS ECG IS  PRESENT Confirmed by Gypsy Balsam [phone]) on 07/27/2023 11:19:21 AM    Recent Labs: 02/09/2023: TSH 1.42 05/07/2023: Creatinine 1.2 05/25/2023: ALT 13; BUN 21; Hemoglobin 11.9; Platelets 322; Potassium 4.8; Sodium 137  Recent Lipid Panel    Component Value Date/Time   CHOL 116 12/03/2022 0000   CHOL 111 09/02/2020 1423   TRIG 80 12/03/2022 0000   HDL 46 12/03/2022 0000   HDL 34 (L) 09/02/2020 1423   CHOLHDL 3.3 09/02/2020 1423   CHOLHDL 4.8 08/24/2009 0645   VLDL 24 08/24/2009 0645   LDLCALC 54 12/03/2022 0000   LDLCALC 51 09/02/2020 1423   LDLDIRECT 55 09/02/2020 1423    Physical Exam:  VS:  BP (!) 114/54 (BP Location: Right Arm, Patient Position: Sitting)   Pulse (!) 56   Ht 5\' 6"  (1.676 m)   Wt 170 lb (77.1 kg)   SpO2 94%   BMI 27.44 kg/m     Wt Readings from Last 3 Encounters:  07/27/23 170 lb (77.1 kg)  07/15/23 185 lb 3.2 oz (84 kg)  07/02/23 185 lb 3.2 oz (84 kg)     GEN:  Well nourished, well developed in no acute distress HEENT: Normal NECK: No JVD; No carotid bruits LYMPHATICS: No lymphadenopathy CARDIAC: RRR, no murmurs, no rubs, no gallops RESPIRATORY:  Clear to auscultation without rales, wheezing or rhonchi  ABDOMEN: Soft, non-tender, non-distended MUSCULOSKELETAL:  No edema; No deformity  SKIN: Warm and dry LOWER EXTREMITIES: no swelling NEUROLOGIC:  Alert and oriented x 3 PSYCHIATRIC:  Normal affect   ASSESSMENT:    1. Permanent atrial fibrillation (HCC)   2. Chronic combined systolic and diastolic congestive heart failure (HCC)   3. Dilated cardiomyopathy (HCC)   4. Nonrheumatic mitral valve regurgitation    PLAN:    In order of problems listed above:  Paroxysmal atrial fibrillation, his rhythm today is normal sinus, he is anticoagulated which I will continue. Chronic combined systolic and diastolic congestive heart failure compensated on physical exam we will continue present management. Dilated cardiomyopathy plan as described  above I will repeat his echocardiogram to look at mitral stenosis. Bradycardia Zio patch will be placed for 2 weeks to see if he get any significant abnormality did need some adjustment.   Medication Adjustments/Labs and Tests Ordered: Current medicines are reviewed at length with the patient today.  Concerns regarding medicines are outlined above.  Orders Placed This Encounter  Procedures   EKG 12-Lead   Medication changes: No orders of the defined types were placed in this encounter.   Signed, Georgeanna Lea, MD, [facility] 07/27/2023 11:26 AM    [facility]

## 2023-07-27 NOTE — Progress Notes (Unsigned)
Subjective:   Sean Bishop is a 89 y.o. male who presents for Medicare Annual/Subsequent preventive examination.  Visit Complete: In person  Patient Medicare AWV questionnaire was completed by the patient on 07/27/23; I have confirmed that all information answered by patient is correct and no changes since this date.        Objective:    Today's Vitals   07/27/23 1323 07/27/23 1539  BP: (!) 114/48   Pulse: 66   Temp: 98.4 F (36.9 C)   SpO2: 98%   Weight: 185 lb 3.2 oz (84 kg)   Height: 5\' 6"  (1.676 m)   PainSc: 0-No pain 4    Body mass index is 29.89 kg/m.     07/27/2023    1:24 PM 07/15/2023   10:49 AM 07/02/2023    2:59 PM 06/28/2023   11:08 AM 06/08/2023    9:43 AM 06/02/2023   12:21 PM 05/25/2023   11:33 AM  Advanced Directives  Does Patient Have a Medical Advance Directive? Yes Yes Yes Yes Yes Yes Yes  Type of Advance Directive Out of facility DNR (pink MOST or yellow form) Out of facility DNR (pink MOST or yellow form) Out of facility DNR (pink MOST or yellow form) Out of facility DNR (pink MOST or yellow form) Healthcare Power of [place];Out of facility DNR (pink MOST or yellow form) Healthcare Power of [place];Out of facility DNR (pink MOST or yellow form) Healthcare Power of [place];Out of facility DNR (pink MOST or yellow form)  Does patient want to make changes to medical advance directive? No - Patient declined No - Patient declined No - Patient declined No - Patient declined No - Patient declined No - Patient declined No - Patient declined  Copy of Healthcare Power of Attorney in Chart?     Yes - validated most recent copy scanned in chart (See row information) Yes - validated most recent copy scanned in chart (See row information) Yes - validated most recent copy scanned in chart (See row information)  Pre-existing out of facility DNR order (yellow form or pink MOST form) Yellow form placed in chart (order not valid for inpatient use) Yellow form placed in  chart (order not valid for inpatient use) Yellow form placed in chart (order not valid for inpatient use)  Yellow form placed in chart (order not valid for inpatient use)      Current Medications (verified) Outpatient Encounter Medications as of 07/27/2023  Medication Sig   ascorbic acid (VITAMIN C) 500 MG tablet Take 500 mg by mouth daily.   Calcium Carbonate-Vit D-Min (CALCIUM 600+D PLUS MINERALS) 600-400 MG-UNIT CHEW Chew 1 Piece by mouth in the morning and at bedtime.   docusate sodium (COLACE) 100 MG capsule Take 1 capsule (100 mg total) by mouth 2 (two) times daily.   donepezil (ARICEPT) 5 MG tablet Take 5 mg by mouth at bedtime.   ELIQUIS 5 MG TABS tablet TAKE 1 TABLET BY MOUTH TWICE DAILY.   furosemide (LASIX) 20 MG tablet Take 20 mg by mouth daily.   gabapentin (NEURONTIN) 100 MG capsule Take 200 mg by mouth at bedtime.   HYDROcodone-acetaminophen (NORCO/VICODIN) 5-325 MG tablet Take 0.5 tablets by mouth in the morning and at bedtime.   latanoprost (XALATAN) 0.005 % ophthalmic solution Place 1 drop into both eyes at bedtime.   lithium carbonate (LITHOBID) 300 MG ER tablet Take 1 tablet (300 mg total) by mouth every evening.   metoprolol succinate (TOPROL-XL) 25 MG 24 hr tablet Take  12.5 mg by mouth daily.   Multiple Vitamin (MULTIVITAMIN WITH MINERALS) TABS tablet Take 1 tablet by mouth daily.   Polyethyl Glycol-Propyl Glycol (SYSTANE) 0.4-0.3 % SOLN Apply 1 drop to eye every 4 (four) hours as needed (dry eyes). And four times a day scheduled   potassium chloride (MICRO-K) 10 MEQ CR capsule Take 10 mEq by mouth daily.   pravastatin (PRAVACHOL) 20 MG tablet Take 20 mg by mouth at bedtime.   sodium chloride (MURO 128) 5 % ophthalmic solution Place 1 drop into both eyes at bedtime.   tamsulosin (FLOMAX) 0.4 MG CAPS capsule Take 1 capsule (0.4 mg total) by mouth daily.   timolol (BETIMOL) 0.5 % ophthalmic solution Place 1 drop into both eyes daily.   traZODone (DESYREL) 50 MG tablet Take  1 tablet (50 mg total) by mouth at bedtime.   triamcinolone cream (KENALOG) 0.1 % Apply 1 Application topically 2 (two) times daily.   [DISCONTINUED] metoprolol succinate (TOPROL-XL) 100 MG 24 hr tablet Take 100 mg by mouth daily. (Patient not taking: Reported on 07/15/2023)   No facility-administered encounter medications on file as of 07/27/2023.    Allergies (verified) Lipitor [atorvastatin]   History: Past Medical History:  Diagnosis Date   AF (paroxysmal atrial fibrillation) (HCC) 02/16/2018   Atrial fibrillation (HCC)    Atrial fibrillation with RVR (HCC) 02/16/2018   Bipolar 1 disorder (HCC)    Bipolar disorder (HCC) 02/17/2018   Dizziness 02/26/2020   DJD (degenerative joint disease)    Dyslipidemia 02/26/2020   Gait instability 02/16/2018   Hypertension    Mitral regurgitation    mild    Polycythemia 02/16/2018   Renal insufficiency    Stroke [facility])    Tremor 02/16/2018   Tremors of nervous system 01/2018   Past Surgical History:  Procedure Laterality Date   SHOULDER SURGERY     Family History  Problem Relation Age of Onset   Alcohol abuse Mother    Cancer Father    Social History   Socioeconomic History   Marital status: Married    Spouse name: Not on file   Number of children: Not on file   Years of education: Not on file   Highest education level: Not on file  Occupational History   Not on file  Tobacco Use   Smoking status: Former   Smokeless tobacco: Never  Vaping Use   Vaping status: Never Used  Substance and Sexual Activity   Alcohol use: Yes    Alcohol/week: 1.0 standard drink of alcohol    Types: 1 Glasses of wine per week    Comment: 1 per month   Drug use: No   Sexual activity: Not on file  Other Topics Concern   Not on file  Social History Narrative   Not on file   Social Drivers of Health   Financial Resource Strain: Not on file  Food Insecurity: No Food Insecurity (07/02/2022)   Hunger Vital Sign    Worried About Running Out of Food in  the Last Year: Never true    Ran Out of Food in the Last Year: Never true  Transportation Needs: No Transportation Needs (07/02/2022)   PRAPARE - Administrator, Civil Service (Medical): No    Lack of Transportation (Non-Medical): No  Physical Activity: Not on file  Stress: Not on file  Social Connections: Not on file    Tobacco Counseling Counseling given: Not Answered   Clinical Intake:  Pre-visit preparation completed: Yes  Pain :  0-10 Pain Score: 4  Pain Type: Chronic pain Pain Location: Shoulder Pain Orientation: Right, Left Pain Descriptors / Indicators: Aching Pain Onset: More than a month ago Pain Frequency: Intermittent Pain Relieving Factors: rest, Tylenol, Norco Effect of Pain on Daily Activities: less self propels in w/c  Pain Relieving Factors: rest, Tylenol, Norco  BMI - recorded: 29.89 Nutritional Status: BMI 25 -29 Overweight Nutritional Risks: None Diabetes: No  How often do you need to have someone help you when you read instructions, pamphlets, or other written materials from your doctor or pharmacy?: 4 - Often What is the last grade level you completed in school?: college  Interpreter Needed?: No  Information entered by :: Shanaye Rief Nedra Hai NP   Activities of Daily Living    07/27/2023    3:42 PM  In your present state of health, do you have any difficulty performing the following activities:  Hearing? 1  Vision? 0  Difficulty concentrating or making decisions? 1  Walking or climbing stairs? 1  Dressing or bathing? 1  Doing errands, shopping? 1  Preparing Food and eating ? N  Using the Toilet? N  In the past six months, have you accidently leaked urine? Y  Do you have problems with loss of bowel control? Y  Managing your Medications? Y  Managing your Finances? Y  Housekeeping or managing your Housekeeping? Y    Patient Care Team: Georgeanna Lea, MD as PCP - Cardiology (Cardiology)  Indicate any recent Medical Services you  may have received from other than [facility] providers in the past year (date may be approximate).     Assessment:   This is a routine wellness examination for Kyi.  Hearing/Vision screen No results found.   Goals Addressed             This Visit's Progress    Maintain Mobility and Function       Evidence-based guidance:  Acknowledge and validate impact of pain, loss of strength and potential disfigurement (hand osteoarthritis) on mental health and daily life, such as social isolation, anxiety, depression, impaired sexual relationship and   injury from falls.  Anticipate referral to physical or occupational therapy for assessment, therapeutic exercise and recommendation for adaptive equipment or assistive devices; encourage participation.  Assess impact on ability to perform activities of daily living, as well as engage in sports and leisure events or requirements of work or school.  Provide anticipatory guidance and reassurance about the benefit of exercise to maintain function; acknowledge and normalize fear that exercise may worsen symptoms.  Encourage regular exercise, at least 10 minutes at a time for 45 minutes per week; consider yoga, water exercise and proprioceptive exercises; encourage use of wearable activity tracker to increase motivation and adherence.  Encourage maintenance or resumption of daily activities, including employment, as pain allows and with minimal exposure to trauma.  Assist patient to advocate for adaptations to the work environment.  Consider level of pain and function, gender, age, lifestyle, patient preference, quality of life, readiness and ?ocapacity to benefit? when recommending patients for orthopaedic surgery consultation.  Explore strategies, such as changes to medication regimen or activity that enables patient to anticipate and manage flare-ups that increase deconditioning and disability.  Explore patient preferences; encourage exposure to a broader  range of activities that have been avoided for fear of experiencing pain.  Identify barriers to participation in therapy or exercise, such as pain with activity, anticipated or imagined pain.  Monitor postoperative joint replacement or any preexisting joint  replacement for ongoing pain and loss of function; provide social support and encouragement throughout recovery.   Notes:        Depression Screen    09/24/2022    1:55 PM 08/03/2022    4:30 PM  PHQ 2/9 Scores  PHQ - 2 Score 0 0    Fall Risk    09/24/2022    1:55 PM 08/25/2022   11:49 AM 08/03/2022    4:30 PM 07/10/2022   11:13 AM 03/09/2018    2:09 PM  Fall Risk   Falls in the past year? 0 1 1 1  Yes  Number falls in past yr: 0 0 1 1 1   Injury with Fall? 0 0 1 1 Yes  Risk Factor Category      High Fall Risk  Risk for fall due to :  No Fall Risks History of fall(s);Impaired balance/gait;Impaired mobility History of fall(s);Impaired balance/gait;Impaired mobility Impaired balance/gait;Impaired mobility  Follow up  Falls evaluation completed Falls evaluation completed Falls evaluation completed Education provided    MEDICARE RISK AT HOME: Medicare Risk at Home Any stairs in or around the home?: Yes If so, are there any without handrails?: No Home free of loose throw rugs in walkways, pet beds, electrical cords, etc?: Yes Adequate lighting in your home to reduce risk of falls?: Yes Life alert?: No Use of a cane, walker or w/c?: Yes Grab bars in the bathroom?: Yes Shower chair or bench in shower?: Yes Elevated toilet seat or a handicapped toilet?: Yes  TIMED UP AND GO:  Was the test performed?  No    Cognitive Function:    07/16/2022   12:27 PM 07/14/2022   12:48 PM  MMSE - Mini Mental State Exam  Orientation to time 3 3  Orientation to Place 2 3  Registration 3 3  Attention/ Calculation 3 2  Recall 0 0  Language- name 2 objects 2 2  Language- repeat 1 1  Language- follow 3 step command 3 3  Language- read & follow  direction 1 1  Write a sentence 0 0  Copy design 0 0  Total score 18 18        Immunizations Immunization History  Administered Date(s) Administered   Hepatitis B, ADULT 11/26/1997, 12/27/1997, 05/08/1998   Influenza, High Dose Seasonal PF 03/31/2023   Influenza-Unspecified 04/15/2022   Moderna Covid-19 Vaccine Bivalent Booster 29yrs & up 04/14/2023   PPD Test 07/08/2022   Pneumococcal Conjugate-13 04/26/2014   Pneumococcal Polysaccharide-23 04/14/2005   RSV,unspecified 08/19/2022   Td 08/04/2022   Tdap 05/18/2016   Zoster Recombinant(Shingrix) 07/29/2022, 10/28/2022    TDAP status: Up to date  Flu Vaccine status: Up to date  Pneumococcal vaccine status: Up to date  Covid-19 vaccine status: Completed vaccines  Qualifies for Shingles Vaccine? Yes   Zostavax completed Yes   Shingrix Completed?: Yes  Screening Tests Health Maintenance  Topic Date Due   COVID-19 Vaccine (2 - Moderna risk series) 05/12/2023   Medicare Annual Wellness (AWV)  07/26/2024   DTaP/Tdap/Td (3 - Td or Tdap) 08/04/2032   Pneumonia Vaccine 6+ Years old  Completed   INFLUENZA VACCINE  Completed   Zoster Vaccines- Shingrix  Completed   HPV VACCINES  Aged Out    Health Maintenance  Health Maintenance Due  Topic Date Due   COVID-19 Vaccine (2 - Moderna risk series) 05/12/2023    Colorectal cancer screening: No longer required.   Lung Cancer Screening: (Low Dose CT Chest recommended if Age 3-80 years, 70  pack-year currently smoking OR have quit w/in 15years.) does not qualify.   Lung Cancer Screening Referral: NA  Additional Screening:  Hepatitis C Screening: does not qualify;   Vision Screening: Recommended annual ophthalmology exams for early detection of glaucoma and other disorders of the eye. Is the patient up to date with their annual eye exam?  Yes  Who is the provider or what is the name of the office in which the patient attends annual eye exams? 01/11/23, [facility] will  provide If pt is not established with a provider, would they like to be referred to a provider to establish care? No .   Dental Screening: Recommended annual dental exams for proper oral hygiene  Diabetic Foot Exam: NA  Community Resource Referral / Chronic Care Management: CRR required this visit?  No   CCM required this visit?  No     Plan:     I have personally reviewed and noted the following in the patient's chart:   Medical and social history Use of alcohol, tobacco or illicit drugs  Current medications and supplements including opioid prescriptions. Patient is currently taking opioid prescriptions. Information provided to patient regarding non-opioid alternatives. Patient advised to discuss non-opioid treatment plan with their provider. Functional ability and status Nutritional status Physical activity Advanced directives List of other physicians Hospitalizations, surgeries, and ER visits in previous 12 months Vitals Screenings to include cognitive, depression, and falls Referrals and appointments  In addition, I have reviewed and discussed with patient certain preventive protocols, quality metrics, and best practice recommendations. A written personalized care plan for preventive services as well as general preventive health recommendations were provided to patient.     Averi Kilty X Moya Duan, NP   07/27/2023   After Visit Summary: (In Person-Declined) Patient declined AVS at this time.

## 2023-07-28 ENCOUNTER — Non-Acute Institutional Stay (SKILLED_NURSING_FACILITY): Payer: Medicare Other | Admitting: Nurse Practitioner

## 2023-07-28 ENCOUNTER — Encounter: Payer: Self-pay | Admitting: Nurse Practitioner

## 2023-07-28 DIAGNOSIS — N1831 Chronic kidney disease, stage 3a: Secondary | ICD-10-CM

## 2023-07-28 DIAGNOSIS — M150 Primary generalized (osteo)arthritis: Secondary | ICD-10-CM

## 2023-07-28 DIAGNOSIS — I4819 Other persistent atrial fibrillation: Secondary | ICD-10-CM

## 2023-07-28 DIAGNOSIS — M810 Age-related osteoporosis without current pathological fracture: Secondary | ICD-10-CM | POA: Diagnosis not present

## 2023-07-28 DIAGNOSIS — R251 Tremor, unspecified: Secondary | ICD-10-CM | POA: Diagnosis not present

## 2023-07-28 DIAGNOSIS — F319 Bipolar disorder, unspecified: Secondary | ICD-10-CM

## 2023-07-28 DIAGNOSIS — R4189 Other symptoms and signs involving cognitive functions and awareness: Secondary | ICD-10-CM | POA: Diagnosis not present

## 2023-07-28 DIAGNOSIS — I5042 Chronic combined systolic (congestive) and diastolic (congestive) heart failure: Secondary | ICD-10-CM

## 2023-07-28 NOTE — Assessment & Plan Note (Signed)
SNF [facility] for supportive care, 07/16/22 MMSE 18/30, taking Deonepezil sine 8/21/2 by Merilyn Baba

## 2023-07-28 NOTE — Progress Notes (Signed)
Location:   SNF [facility] Room Number: 18 Place of Service:  SNF (31) Provider: Chipper Oman NP  No primary care provider on file.  Patient Care Team: Georgeanna Lea, MD as PCP - Cardiology (Cardiology)  Extended Emergency Contact Information Primary Emergency Contact: Hitchman,[name] Address: [address]           [facility] [address]          [place], [place] [address] [name] Home Phone: [phone] Mobile Phone: [phone] Relation: Spouse Secondary Emergency Contact: [name] Address: [address]          [place], [place] [address] [place] Home Phone: [phone] Mobile Phone: [phone] Relation: Son  Code Status: DNR Goals of care: Advanced Directive information    07/27/2023    1:24 PM  Advanced Directives  Does Patient Have a Medical Advance Directive? Yes  Type of Advance Directive Out of facility DNR (pink MOST or yellow form)  Does patient want to make changes to medical advance directive? No - Patient declined  Pre-existing out of facility DNR order (yellow form or pink MOST form) Yellow form placed in chart (order not valid for inpatient use)     Chief Complaint  Patient presents with   Acute Visit    Reported rash bil forearms, worsened pain in the left shoulder area.     HPI:  Pt is a 89 y.o. male seen today for an acute visit for worsened left shoulder/neck pain with tingling/numbness left arm and reduced ROM, not new, no injury, has been out for cardiology appointment earlier today. Has been taking Norco and Gabapentin for pain.   Scratched marks forearms, no apparent rash, Triamcinolone cream available to him.   HOH L>R, HPOA-wife declined hearing aids.              Parkinson's disease, stopped parkinson's medication because of affected his cognition, had resting tremor in left arm/leg in the past             OP 12/01/22 dc'd Alendronate tscore -2.9 2017, -0.9 01/19/19, refused DEXA  11/02/22             OA shoulders pain, worsens at night, limited abduction ROM over shoulder level, hx of neck clicks with ROM, aches, c/o right sciatica pain, worsens in am, chronic left knee pain,  Norco is effective.              Cognitive impairment, SNF [facility] for supportive care, 07/16/22 MMSE 18/30, taking Deonepezil sine 8/21/2 by Psych             CVA, no focal weakness residual.              Afib, off Diltiazem 04/09/23 2/2 edema), decreased Metoprolol to 12.5mg  2/2 bradycardia, taking  Eliquis             Bradycardia, decreased Metoprolol 12.5mg  every day (switched to Metoprolol from Diltiazem 04/09/23 2/2 to edema BLE), Zio  on, Echo 2/20//25             Bipolar disorder, followed by psychiatry, on Lithium, TSH 1.42 02/09/23, 07/16/22 Lithium level 0.6(0.6-1.2)             Insomnia, takes Trazodone since 02/17/23 by psych             CHF, EF 45-50% 11/2021, taking Furosemide, trace edema BLE, 05/10/23 CXR-mild pulmonary venous congestion, no infiltrate  Gait abnormality, uses w/c for mobility mostly.              Hyponatremia, Na 139 07/15/23             Hyperbilirubinemia, bilirubin 0.6 07/15/23            Polycythemia,  Hgb 11.1 07/15/23             CKD, Bun/creat 24/1.1 07/15/23             Hypogonadism, hx of  testosterone inj             Hyperlipidemia, takes Pravastatin             Glaucoma, eye drops.     Past Medical History:  Diagnosis Date   AF (paroxysmal atrial fibrillation) (HCC) 02/16/2018   Atrial fibrillation (HCC)    Atrial fibrillation with RVR (HCC) 02/16/2018   Bipolar 1 disorder (HCC)    Bipolar disorder (HCC) 02/17/2018   Dizziness 02/26/2020   DJD (degenerative joint disease)    Dyslipidemia 02/26/2020   Gait instability 02/16/2018   Hypertension    Mitral regurgitation    mild    Polycythemia 02/16/2018   Renal insufficiency    Stroke [facility])    Tremor 02/16/2018   Tremors of nervous system 01/2018   Past Surgical History:  Procedure Laterality Date    SHOULDER SURGERY      Allergies  Allergen Reactions   Lipitor [Atorvastatin] Other (See Comments)    Joint soreness    Allergies as of 07/28/2023       Reactions   Lipitor [atorvastatin] Other (See Comments)   Joint soreness        Medication List        Accurate as of July 28, 2023  2:04 PM. If you have any questions, ask your nurse or doctor.          ascorbic acid 500 MG tablet Commonly known as: VITAMIN C Take 500 mg by mouth daily.   Calcium 600+D Plus Minerals 600-400 MG-UNIT Chew Chew 1 Piece by mouth in the morning and at bedtime.   docusate sodium 100 MG capsule Commonly known as: Colace Take 1 capsule (100 mg total) by mouth 2 (two) times daily.   donepezil 5 MG tablet Commonly known as: ARICEPT Take 5 mg by mouth at bedtime.   Eliquis 5 MG Tabs tablet Generic drug: apixaban TAKE 1 TABLET BY MOUTH TWICE DAILY.   furosemide 20 MG tablet Commonly known as: LASIX Take 20 mg by mouth daily.   gabapentin 100 MG capsule Commonly known as: NEURONTIN Take 200 mg by mouth at bedtime.   HYDROcodone-acetaminophen 5-325 MG tablet Commonly known as: NORCO/VICODIN Take 0.5 tablets by mouth in the morning and at bedtime.   latanoprost 0.005 % ophthalmic solution Commonly known as: XALATAN Place 1 drop into both eyes at bedtime.   lithium carbonate 300 MG ER tablet Commonly known as: LITHOBID Take 1 tablet (300 mg total) by mouth every evening.   metoprolol succinate 25 MG 24 hr tablet Commonly known as: TOPROL-XL Take 12.5 mg by mouth daily.   multivitamin with minerals Tabs tablet Take 1 tablet by mouth daily.   potassium chloride 10 MEQ CR capsule Commonly known as: MICRO-K Take 10 mEq by mouth daily.   pravastatin 20 MG tablet Commonly known as: PRAVACHOL Take 20 mg by mouth at bedtime.   sodium chloride 5 % ophthalmic solution Commonly known as: MURO 128 Place 1 drop into both  eyes at bedtime.   Systane 0.4-0.3 % Soln Generic  drug: Polyethyl Glycol-Propyl Glycol Apply 1 drop to eye every 4 (four) hours as needed (dry eyes). And four times a day scheduled   tamsulosin 0.4 MG Caps capsule Commonly known as: FLOMAX Take 1 capsule (0.4 mg total) by mouth daily.   timolol 0.5 % ophthalmic solution Commonly known as: BETIMOL Place 1 drop into both eyes daily.   traZODone 50 MG tablet Commonly known as: DESYREL Take 1 tablet (50 mg total) by mouth at bedtime.   triamcinolone cream 0.1 % Commonly known as: KENALOG Apply 1 Application topically 2 (two) times daily.        Review of Systems  Constitutional:  Negative for appetite change, fatigue and fever.  HENT:  Positive for hearing loss. Negative for congestion and trouble swallowing.   Eyes:  Negative for visual disturbance.  Respiratory:  Negative for cough, chest tightness, shortness of breath and wheezing.   Cardiovascular:  Positive for leg swelling.  Gastrointestinal:  Negative for abdominal pain and constipation.  Genitourinary:  Negative for dysuria and urgency.       Incontinent of urine.   Musculoskeletal:  Positive for arthralgias, back pain and gait problem.       Shoulders pain with above shoulder level abduction ROM, worsens at night. R sciatic pain, not new, worse in am when getting up, chronic left knee pain Worsened left neck/shoulder pain /tingling, numbness arm, no decreased muscle strength.   Skin:  Negative for color change.  Neurological:  Negative for speech difficulty, weakness and headaches.  Psychiatric/Behavioral:  Positive for confusion and sleep disturbance. The patient is not nervous/anxious.        Early am awake, chronic    Immunization History  Administered Date(s) Administered   Hepatitis B, ADULT 11/26/1997, 12/27/1997, 05/08/1998   Influenza, High Dose Seasonal PF 03/31/2023   Influenza-Unspecified 04/15/2022   Moderna Covid-19 Vaccine Bivalent Booster 39yrs & up 04/14/2023   PPD Test 07/08/2022   Pneumococcal  Conjugate-13 04/26/2014   Pneumococcal Polysaccharide-23 04/14/2005   RSV,unspecified 08/19/2022   Td 08/04/2022   Tdap 05/18/2016   Zoster Recombinant(Shingrix) 07/29/2022, 10/28/2022   Pertinent  Health Maintenance Due  Topic Date Due   INFLUENZA VACCINE  Completed      07/08/2022    9:22 AM 07/10/2022   11:13 AM 08/03/2022    4:30 PM 08/25/2022   11:49 AM 09/24/2022    1:55 PM  Fall Risk  Falls in the past year?  1 1 1  0  Was there an injury with Fall?  1 1 0 0  Fall Risk Category Calculator  3 3 1  0  Fall Risk Category (Retired)  High     (RETIRED) Patient Fall Risk Level High fall risk High fall risk     Patient at Risk for Falls Due to  History of fall(s);Impaired balance/gait;Impaired mobility History of fall(s);Impaired balance/gait;Impaired mobility No Fall Risks   Fall risk Follow up  Falls evaluation completed Falls evaluation completed Falls evaluation completed    Functional Status Survey:    Vitals:   07/28/23 1344  BP: (!) 103/58  Pulse: (!) 55  Resp: 18  Temp: (!) 97.4 F (36.3 C)  SpO2: 97%  Weight: 185 lb 3.2 oz (84 kg)   Body mass index is 29.89 kg/m. Physical Exam Vitals and nursing note reviewed.  Constitutional:      Appearance: Normal appearance.  HENT:     Head: Normocephalic and atraumatic.  Nose: Nose normal.     Mouth/Throat:     Mouth: Mucous membranes are moist.  Eyes:     Extraocular Movements: Extraocular movements intact.     Conjunctiva/sclera: Conjunctivae normal.     Pupils: Pupils are equal, round, and reactive to light.  Cardiovascular:     Rate and Rhythm: Bradycardia present. Rhythm irregular.     Heart sounds: No murmur heard. Pulmonary:     Effort: Pulmonary effort is normal.     Breath sounds: No rales.  Abdominal:     General: Bowel sounds are normal.     Palpations: Abdomen is soft.     Tenderness: There is no abdominal tenderness.  Musculoskeletal:        General: Tenderness present. No swelling, deformity or  signs of injury.     Cervical back: Normal range of motion and neck supple.     Right lower leg: Edema present.     Left lower leg: Edema present.     Comments: trace edema BLE. Shoulders pain with above shoulder level abduction ROM, worsens at night, R sciatica pain worse in am/getting out of bed, chronic left knee pain. Worsened left neck/shoulder pain with left arm tingling/numbness today, no injury, no decreased muscle strength.   Skin:    General: Skin is warm and dry.     Comments: The right 5th toe nail yellow thick from previous examination Scratched marks R+L forearm  Neurological:     General: No focal deficit present.     Mental Status: He is alert and oriented to person, place, and time. Mental status is at baseline.     Motor: No weakness.     Gait: Gait abnormal.  Psychiatric:        Mood and Affect: Mood normal.        Behavior: Behavior normal.        Thought Content: Thought content normal.     Labs reviewed: Recent Labs    02/18/23 0000 05/07/23 0000 05/13/23 0000 05/25/23 0000 07/15/23 0000  NA 135* 138  --  137 139  K 4.7 4.6 4.8 4.8 4.3  CL 102 105  --  103 103  CO2 26* 26*  --  27* 27*  BUN 24* 23*  --  21 24*  CREATININE 1.1 1.2  --   --  1.1  CALCIUM 10.5 10.2  --  10.7 10.2   Recent Labs    02/18/23 0000 05/25/23 0000 07/15/23 0000  AST 17 19 20   ALT 9* 13 10  ALKPHOS 77 84 82  ALBUMIN 3.9 3.8 3.7   Recent Labs    02/18/23 0000 05/25/23 0000 07/15/23 0000  WBC 6.9 10.6 9.1  NEUTROABS 4,637.00 7,590.00 8,199.00  HGB 12.9* 11.9* 11.1*  HCT 39* 36* 34*  PLT 247 322 296   Lab Results  Component Value Date   TSH 1.42 02/09/2023   Lab Results  Component Value Date   HGBA1C  08/24/2009    5.7 (NOTE) The ADA recommends the following therapeutic goal for glycemic control related to Hgb A1c measurement: Goal of therapy: <6.5 Hgb A1c  Reference: American Diabetes Association: Clinical Practice Recommendations 2010, Diabetes Care, 2010,  33: (Suppl  1).   Lab Results  Component Value Date   CHOL 116 12/03/2022   HDL 46 12/03/2022   LDLCALC 54 12/03/2022   LDLDIRECT 55 09/02/2020   TRIG 80 12/03/2022   CHOLHDL 3.3 09/02/2020    Significant Diagnostic Results in last 30 days:  No results found.  Assessment/Plan: Osteoarthritis, multiple sites 06/14/23 Ortho R shoulder, ice pk, limited WB and activities to pain tolerance, no exercise to R shoulder, Tylenol. Prn.   worsened left shoulder/neck pain with tingling/numbness left arm and reduced ROM, not new, no injury, has been out for cardiology appointment earlier today. Has been taking Norco and Gabapentin for pain.  Increase Norco for pain control, may xray or Ortho f/u if no better.  shoulders pain, worsens at night, limited abduction ROM over shoulder level, hx of neck clicks with ROM, aches, c/o right sciatica pain, worsens in am, chronic left knee pain,  Norco is effective.   Tremors of nervous system Parkinson's disease, stopped parkinson's medication because of affected his cognition, had resting tremor in left arm/leg in the past  Osteoporosis 12/01/22 dc'd Alendronate tscore -2.9 2017, -0.9 01/19/19, refused DEXA 11/02/22  Cognitive impairment SNF [facility] for supportive care, 07/16/22 MMSE 18/30, taking Deonepezil sine 8/21/2 by Psych  Atrial fibrillation, persistent (HCC) off Diltiazem 04/09/23 2/2 edema), decreased Metoprolol to 12.5mg  2/2 bradycardia, taking  Eliquis             Bradycardia, decreased Metoprolol 12.5mg  every day (switched to Metoprolol from Diltiazem 04/09/23 2/2 to edema BLE), Zio  on, Echo 2/20//25  Bipolar disorder (HCC)  followed by psychiatry, on Lithium, TSH 1.42 02/09/23, 07/16/22 Lithium level 0.6(0.6-1.2)             Insomnia, takes Trazodone since 02/17/23 by psych  Chronic CHF (HCC) EF 45-50% 11/2021, taking Furosemide, trace edema BLE, 05/10/23 CXR-mild pulmonary venous congestion, no infiltrate  CKD (chronic kidney disease) stage 3,  GFR 30-59 ml/min (HCC)  Bun/creat 24/1.1 07/15/23    Family/ staff Communication: plan of care reviewed with the patient and charge nurse.   Labs/tests ordered:  none  Time spend 30 minutes.

## 2023-07-28 NOTE — Assessment & Plan Note (Signed)
followed by psychiatry, on Lithium, TSH 1.42 02/09/23, 07/16/22 Lithium level 0.6(0.6-1.2)             Insomnia, takes Trazodone since 02/17/23 by psych

## 2023-07-28 NOTE — Assessment & Plan Note (Signed)
off Diltiazem 04/09/23 2/2 edema), decreased Metoprolol to 12.5mg  2/2 bradycardia, taking  Eliquis             Bradycardia, decreased Metoprolol 12.5mg  every day (switched to Metoprolol from Diltiazem 04/09/23 2/2 to edema BLE), Zio  on, Echo 2/20//25

## 2023-07-28 NOTE — Assessment & Plan Note (Signed)
06/14/23 Ortho R shoulder, ice pk, limited WB and activities to pain tolerance, no exercise to R shoulder, Tylenol. Prn.   worsened left shoulder/neck pain with tingling/numbness left arm and reduced ROM, not new, no injury, has been out for cardiology appointment earlier today. Has been taking Norco and Gabapentin for pain.  Increase Norco for pain control, may xray or Ortho f/u if no better.  shoulders pain, worsens at night, limited abduction ROM over shoulder level, hx of neck clicks with ROM, aches, c/o right sciatica pain, worsens in am, chronic left knee pain,  Norco is effective.

## 2023-07-28 NOTE — Assessment & Plan Note (Signed)
Parkinson's disease, stopped parkinson's medication because of affected his cognition, had resting tremor in left arm/leg in the past

## 2023-07-28 NOTE — Assessment & Plan Note (Signed)
EF 45-50% 11/2021, taking Furosemide, trace edema BLE, 05/10/23 CXR-mild pulmonary venous congestion, no infiltrate

## 2023-07-28 NOTE — Assessment & Plan Note (Signed)
Bun/creat 24/1.1 07/15/23

## 2023-07-28 NOTE — Assessment & Plan Note (Signed)
12/01/22 dc'd Alendronate tscore -2.9 2017, -0.9 01/19/19, refused DEXA 11/02/22

## 2023-08-06 ENCOUNTER — Encounter: Payer: Self-pay | Admitting: Nurse Practitioner

## 2023-08-06 ENCOUNTER — Non-Acute Institutional Stay (SKILLED_NURSING_FACILITY): Payer: Self-pay | Admitting: Nurse Practitioner

## 2023-08-06 DIAGNOSIS — M810 Age-related osteoporosis without current pathological fracture: Secondary | ICD-10-CM | POA: Diagnosis not present

## 2023-08-06 DIAGNOSIS — R001 Bradycardia, unspecified: Secondary | ICD-10-CM

## 2023-08-06 DIAGNOSIS — R4189 Other symptoms and signs involving cognitive functions and awareness: Secondary | ICD-10-CM

## 2023-08-06 DIAGNOSIS — M150 Primary generalized (osteo)arthritis: Secondary | ICD-10-CM

## 2023-08-06 DIAGNOSIS — R251 Tremor, unspecified: Secondary | ICD-10-CM | POA: Diagnosis not present

## 2023-08-06 DIAGNOSIS — I4821 Permanent atrial fibrillation: Secondary | ICD-10-CM

## 2023-08-06 DIAGNOSIS — N1831 Chronic kidney disease, stage 3a: Secondary | ICD-10-CM

## 2023-08-06 DIAGNOSIS — F319 Bipolar disorder, unspecified: Secondary | ICD-10-CM

## 2023-08-06 DIAGNOSIS — I5042 Chronic combined systolic (congestive) and diastolic (congestive) heart failure: Secondary | ICD-10-CM

## 2023-08-06 NOTE — Progress Notes (Signed)
 Location:   [facility] Room Number: 18 Place of Service:  SNF (31) Provider: Larwance Hark NP  No primary care provider on file.  Patient Care Team: Krasowski, Robert J, MD as PCP - Cardiology (Cardiology)  Extended Emergency Contact Information Primary Emergency Contact: Asberry,[name] Address: [address]           [facility] [address]          [place], [place] [address] [place] Home Phone: [phone] Mobile Phone: [phone] Relation: Spouse Secondary Emergency Contact: Kensinger,Andy Address: [address]          [place], [place] [address] [place] Home Phone: [phone] Mobile Phone: [phone] Relation: Son  Code Status:  DNR Goals of care: Advanced Directive information    07/27/2023    1:24 PM  Advanced Directives  Does Patient Have a Medical Advance Directive? Yes  Type of Advance Directive Out of facility DNR (pink MOST or yellow form)  Does patient want to make changes to medical advance directive? No - Patient declined  Pre-existing out of facility DNR order (yellow form or pink MOST form) Yellow form placed in chart (order not valid for inpatient use)     Chief Complaint  Patient presents with   Medical Management of Chronic Issues    HPI:  Pt is a 89 y.o. male seen today for medical management of chronic diseases.     HOH L>R, HPOA-wife declined hearing aids.              Parkinson's disease, stopped parkinson's medication because of affected his cognition, had resting tremor in left arm/leg in the past             OP 12/01/22 dc'd Alendronate tscore -2.9 2017, -0.9 01/19/19, refused DEXA 11/02/22             OA shoulders pain, worsens at night, limited abduction ROM over shoulder level, hx of neck clicks with ROM, aches, c/o right sciatica pain, worsens in am, chronic left knee pain,  Norco and Gabapentin for pain control.              Cognitive impairment, SNF [facility] for supportive care, 07/16/22 MMSE  18/30, taking Deonepezil sine 8/21/2 by Psych             CVA, no focal weakness residual.              Afib, off Diltiazem  04/09/23 2/2 edema), decreased Metoprolol  to 12.5mg  2/2 bradycardia, taking  Eliquis              Bradycardia, decreased Metoprolol  12.5mg  every day (switched to Metoprolol  from Diltiazem  04/09/23 2/2 to edema BLE), Zio  on, Echo 2/20//25             Bipolar disorder, followed by psychiatry, on Lithium , TSH 1.42 02/09/23, 07/16/22 Lithium  level 0.6(0.6-1.2)             Insomnia, takes Trazodone  since 02/17/23 by psych             CHF, EF 45-50% 11/2021, taking Furosemide, trace edema BLE, 05/10/23 CXR-mild pulmonary venous congestion, no infiltrate             Gait abnormality, uses w/c for mobility mostly.              Hyponatremia, Na 139 07/15/23             Hyperbilirubinemia, bilirubin 0.6 07/15/23  Polycythemia,  Hgb 11.1 07/15/23             CKD, Bun/creat 24/1.1 07/15/23             Hypogonadism, hx of  testosterone  inj             Hyperlipidemia, takes Pravastatin              Glaucoma, eye drops.     Past Medical History:  Diagnosis Date   AF (paroxysmal atrial fibrillation) (HCC) 02/16/2018   Atrial fibrillation (HCC)    Atrial fibrillation with RVR (HCC) 02/16/2018   Bipolar 1 disorder (HCC)    Bipolar disorder (HCC) 02/17/2018   Dizziness 02/26/2020   DJD (degenerative joint disease)    Dyslipidemia 02/26/2020   Gait instability 02/16/2018   Hypertension    Mitral regurgitation    mild    Polycythemia 02/16/2018   Renal insufficiency    Stroke [facility])    Tremor 02/16/2018   Tremors of nervous system 01/2018   Past Surgical History:  Procedure Laterality Date   SHOULDER SURGERY      Allergies  Allergen Reactions   Lipitor [Atorvastatin] Other (See Comments)    Joint soreness    Allergies as of 08/06/2023       Reactions   Lipitor [atorvastatin] Other (See Comments)   Joint soreness        Medication List        Accurate as of August 06, 2023 11:59 PM. If you have any questions, ask your nurse or doctor.          ascorbic acid  500 MG tablet Commonly known as: VITAMIN C  Take 500 mg by mouth daily.   Calcium 600+D Plus Minerals 600-400 MG-UNIT Chew Chew 1 Piece by mouth in the morning and at bedtime.   docusate sodium  100 MG capsule Commonly known as: Colace Take 1 capsule (100 mg total) by mouth 2 (two) times daily.   donepezil  5 MG tablet Commonly known as: ARICEPT  Take 5 mg by mouth at bedtime.   Eliquis  5 MG Tabs tablet Generic drug: apixaban  TAKE 1 TABLET BY MOUTH TWICE DAILY.   furosemide 20 MG tablet Commonly known as: LASIX Take 20 mg by mouth daily.   gabapentin 100 MG capsule Commonly known as: NEURONTIN Take 200 mg by mouth at bedtime.   HYDROcodone -acetaminophen  5-325 MG tablet Commonly known as: NORCO/VICODIN Take 0.5 tablets by mouth in the morning and at bedtime.   latanoprost  0.005 % ophthalmic solution Commonly known as: XALATAN  Place 1 drop into both eyes at bedtime.   lithium  carbonate 300 MG ER tablet Commonly known as: LITHOBID  Take 1 tablet (300 mg total) by mouth every evening.   metoprolol  succinate 25 MG 24 hr tablet Commonly known as: TOPROL -XL Take 12.5 mg by mouth daily.   multivitamin with minerals Tabs tablet Take 1 tablet by mouth daily.   potassium chloride 10 MEQ CR capsule Commonly known as: MICRO-K Take 10 mEq by mouth daily.   pravastatin  20 MG tablet Commonly known as: PRAVACHOL  Take 20 mg by mouth at bedtime.   sodium chloride  5 % ophthalmic solution Commonly known as: MURO 128 Place 1 drop into both eyes at bedtime.   Systane 0.4-0.3 % Soln Generic drug: Polyethyl Glycol-Propyl Glycol Apply 1 drop to eye every 4 (four) hours as needed (dry eyes). And four times a day scheduled   tamsulosin  0.4 MG Caps capsule Commonly known as: FLOMAX  Take 1 capsule (0.4 mg total) by mouth  daily.   timolol  0.5 % ophthalmic solution Commonly known as:  BETIMOL  Place 1 drop into both eyes daily.   traZODone  50 MG tablet Commonly known as: DESYREL  Take 1 tablet (50 mg total) by mouth at bedtime.   triamcinolone  cream 0.1 % Commonly known as: KENALOG  Apply 1 Application topically 2 (two) times daily.        Review of Systems  Constitutional:  Negative for appetite change, fatigue and fever.  HENT:  Positive for hearing loss. Negative for congestion and trouble swallowing.   Eyes:  Negative for visual disturbance.  Respiratory:  Negative for cough.   Cardiovascular:  Positive for leg swelling.  Gastrointestinal:  Negative for abdominal pain and constipation.  Genitourinary:  Negative for dysuria and urgency.       Incontinent of urine.   Musculoskeletal:  Positive for arthralgias, back pain and gait problem.       Shoulders pain with above shoulder level abduction ROM, worsens at night. R sciatic pain, not new, worse in am when getting up, chronic left knee pain Worsened left neck/shoulder pain /tingling, numbness arm, no decreased muscle strength.   Skin:  Negative for color change.  Neurological:  Negative for speech difficulty, weakness and headaches.  Psychiatric/Behavioral:  Positive for confusion and sleep disturbance. The patient is not nervous/anxious.        Early am awake, chronic    Immunization History  Administered Date(s) Administered   Hepatitis B, ADULT 11/26/1997, 12/27/1997, 05/08/1998   Influenza, High Dose Seasonal PF 03/31/2023   Influenza-Unspecified 04/15/2022   Moderna Covid-19 Vaccine Bivalent Booster 87yrs & up 04/14/2023   PPD Test 07/08/2022   Pneumococcal Conjugate-13 04/26/2014   Pneumococcal Polysaccharide-23 04/14/2005   RSV,unspecified 08/19/2022   Td 08/04/2022   Tdap 05/18/2016   Zoster Recombinant(Shingrix) 07/29/2022, 10/28/2022   Pertinent  Health Maintenance Due  Topic Date Due   INFLUENZA VACCINE  Completed      07/08/2022    9:22 AM 07/10/2022   11:13 AM 08/03/2022    4:30 PM  08/25/2022   11:49 AM 09/24/2022    1:55 PM  Fall Risk  Falls in the past year?  1 1 1  0  Was there an injury with Fall?  1 1 0 0  Fall Risk Category Calculator  3 3 1  0  Fall Risk Category (Retired)  High     (RETIRED) Patient Fall Risk Level High fall risk High fall risk     Patient at Risk for Falls Due to  History of fall(s);Impaired balance/gait;Impaired mobility History of fall(s);Impaired balance/gait;Impaired mobility No Fall Risks   Fall risk Follow up  Falls evaluation completed Falls evaluation completed Falls evaluation completed    Functional Status Survey:    Vitals:   08/06/23 1426  BP: (!) 121/59  Pulse: 81  Resp: 16  Temp: (!) 97.3 F (36.3 C)  SpO2: 98%  Weight: 187 lb (84.8 kg)   Body mass index is 30.18 kg/m. Physical Exam Vitals and nursing note reviewed.  Constitutional:      Appearance: Normal appearance.  HENT:     Head: Normocephalic and atraumatic.     Nose: Nose normal.     Mouth/Throat:     Mouth: Mucous membranes are moist.  Eyes:     Extraocular Movements: Extraocular movements intact.     Conjunctiva/sclera: Conjunctivae normal.     Pupils: Pupils are equal, round, and reactive to light.  Cardiovascular:     Rate and Rhythm: Bradycardia present. Rhythm irregular.  Heart sounds: No murmur heard. Pulmonary:     Effort: Pulmonary effort is normal.     Breath sounds: No rales.  Abdominal:     General: Bowel sounds are normal.     Palpations: Abdomen is soft.     Tenderness: There is no abdominal tenderness.  Musculoskeletal:        General: Tenderness present. No swelling, deformity or signs of injury.     Cervical back: Normal range of motion and neck supple.     Right lower leg: Edema present.     Left lower leg: Edema present.     Comments: trace edema BLE.  Shoulders pain with above shoulder level abduction ROM, worsens at night, R sciatica pain worse in am/getting out of bed, chronic left knee pain.  Skin:    General: Skin is  warm and dry.     Comments: The right 5th toe nail yellow thick from previous examination Scratched marks R+L forearm  Neurological:     General: No focal deficit present.     Mental Status: He is alert and oriented to person, place, and time. Mental status is at baseline.     Motor: No weakness.     Gait: Gait abnormal.  Psychiatric:        Mood and Affect: Mood normal.        Behavior: Behavior normal.        Thought Content: Thought content normal.     Labs reviewed: Recent Labs    02/18/23 0000 05/07/23 0000 05/13/23 0000 05/25/23 0000 07/15/23 0000  NA 135* 138  --  137 139  K 4.7 4.6 4.8 4.8 4.3  CL 102 105  --  103 103  CO2 26* 26*  --  27* 27*  BUN 24* 23*  --  21 24*  CREATININE 1.1 1.2  --   --  1.1  CALCIUM 10.5 10.2  --  10.7 10.2   Recent Labs    02/18/23 0000 05/25/23 0000 07/15/23 0000  AST 17 19 20   ALT 9* 13 10  ALKPHOS 77 84 82  ALBUMIN 3.9 3.8 3.7   Recent Labs    02/18/23 0000 05/25/23 0000 07/15/23 0000  WBC 6.9 10.6 9.1  NEUTROABS 4,637.00 7,590.00 8,199.00  HGB 12.9* 11.9* 11.1*  HCT 39* 36* 34*  PLT 247 322 296   Lab Results  Component Value Date   TSH 1.42 02/09/2023   Lab Results  Component Value Date   HGBA1C  08/24/2009    5.7 (NOTE) The ADA recommends the following therapeutic goal for glycemic control related to Hgb A1c measurement: Goal of therapy: <6.5 Hgb A1c  Reference: American Diabetes Association: Clinical Practice Recommendations 2010, Diabetes Care, 2010, 33: (Suppl  1).   Lab Results  Component Value Date   CHOL 116 12/03/2022   HDL 46 12/03/2022   LDLCALC 54 12/03/2022   LDLDIRECT 55 09/02/2020   TRIG 80 12/03/2022   CHOLHDL 3.3 09/02/2020    Significant Diagnostic Results in last 30 days:  No results found.  Assessment/Plan  Tremors of nervous system stopped parkinson's medication because of affected his cognition, had resting tremor in left arm/leg in the past  Osteoporosis 12/01/22 dc'd  Alendronate tscore -2.9 2017, -0.9 01/19/19, refused DEXA 11/02/22  Osteoarthritis, multiple sites shoulders pain, worsens at night, limited abduction ROM over shoulder level, hx of neck clicks with ROM, aches, c/o right sciatica pain, worsens in am, chronic left knee pain,  Norco and Gabapentin for pain  control.  06/14/23 Ortho R shoulder, ice pk, limited WB and activities to pain tolerance, no exercise to R shoulder, Tylenol . Prn.   Cognitive impairment SNF [facility] for supportive care, 07/16/22 MMSE 18/30, taking Deonepezil sine 8/21/2 by Psych  Atrial fibrillation (HCC) off Diltiazem  04/09/23 2/2 edema), decreased Metoprolol  to 12.5mg  2/2 bradycardia, taking  Eliquis   Bradycardia decreased Metoprolol  12.5mg  every day (switched to Metoprolol  from Diltiazem  04/09/23 2/2 to edema BLE), Zio  on, Echo 2/20//25  Bipolar disorder (HCC) followed by psychiatry, on Lithium , TSH 1.42 02/09/23, 07/16/22 Lithium  level 0.6(0.6-1.2)             Insomnia, takes Trazodone  since 02/17/23 by psych  Chronic CHF (HCC) Compensated clinically, EF 45-50% 11/2021, taking Furosemide, trace edema BLE, 05/10/23 CXR-mild pulmonary venous congestion, no infiltrate   Family/ staff Communication: plan of care reviewed with the patient and charge nurse.   Labs/tests ordered:  none

## 2023-08-09 NOTE — Assessment & Plan Note (Signed)
 Bun/creat 24/1.1 07/15/23

## 2023-08-09 NOTE — Assessment & Plan Note (Signed)
 decreased Metoprolol 12.5mg  every day (switched to Metoprolol from Diltiazem 04/09/23 2/2 to edema BLE), Zio  on, Echo 2/20//25

## 2023-08-09 NOTE — Assessment & Plan Note (Signed)
 followed by psychiatry, on Lithium, TSH 1.42 02/09/23, 07/16/22 Lithium level 0.6(0.6-1.2)             Insomnia, takes Trazodone since 02/17/23 by psych

## 2023-08-09 NOTE — Assessment & Plan Note (Signed)
 off Diltiazem  04/09/23 2/2 edema), decreased Metoprolol  to 12.5mg  2/2 bradycardia, taking  Eliquis 

## 2023-08-09 NOTE — Assessment & Plan Note (Signed)
 Compensated clinically, EF 45-50% 11/2021, taking Furosemide, trace edema BLE, 05/10/23 CXR-mild pulmonary venous congestion, no infiltrate

## 2023-08-09 NOTE — Assessment & Plan Note (Signed)
 SNF [facility] for supportive care, 07/16/22 MMSE 18/30, taking Deonepezil sine 8/21/2 by Merilyn Baba

## 2023-08-09 NOTE — Assessment & Plan Note (Addendum)
 shoulders pain, worsens at night, limited abduction ROM over shoulder level, hx of neck clicks with ROM, aches, c/o right sciatica pain, worsens in am, chronic left knee pain,  Norco and Gabapentin for pain control.  06/14/23 Ortho R shoulder, ice pk, limited WB and activities to pain tolerance, no exercise to R shoulder, Tylenol . Prn.

## 2023-08-09 NOTE — Assessment & Plan Note (Signed)
 12/01/22 dc'd Alendronate tscore -2.9 2017, -0.9 01/19/19, refused DEXA 11/02/22

## 2023-08-09 NOTE — Assessment & Plan Note (Signed)
stopped parkinson's medication because of affected his cognition, had resting tremor in left arm/leg in the past

## 2023-08-10 ENCOUNTER — Non-Acute Institutional Stay (SKILLED_NURSING_FACILITY): Payer: Medicare Other | Admitting: Nurse Practitioner

## 2023-08-10 DIAGNOSIS — F319 Bipolar disorder, unspecified: Secondary | ICD-10-CM | POA: Diagnosis not present

## 2023-08-10 DIAGNOSIS — F5101 Primary insomnia: Secondary | ICD-10-CM

## 2023-08-10 DIAGNOSIS — I4819 Other persistent atrial fibrillation: Secondary | ICD-10-CM

## 2023-08-10 DIAGNOSIS — R531 Weakness: Secondary | ICD-10-CM | POA: Diagnosis not present

## 2023-08-10 DIAGNOSIS — I5042 Chronic combined systolic (congestive) and diastolic (congestive) heart failure: Secondary | ICD-10-CM | POA: Diagnosis not present

## 2023-08-10 DIAGNOSIS — M150 Primary generalized (osteo)arthritis: Secondary | ICD-10-CM

## 2023-08-10 DIAGNOSIS — R001 Bradycardia, unspecified: Secondary | ICD-10-CM

## 2023-08-10 DIAGNOSIS — R4189 Other symptoms and signs involving cognitive functions and awareness: Secondary | ICD-10-CM

## 2023-08-10 DIAGNOSIS — M6281 Muscle weakness (generalized): Secondary | ICD-10-CM | POA: Diagnosis not present

## 2023-08-10 DIAGNOSIS — R251 Tremor, unspecified: Secondary | ICD-10-CM

## 2023-08-10 DIAGNOSIS — Z8673 Personal history of transient ischemic attack (TIA), and cerebral infarction without residual deficits: Secondary | ICD-10-CM

## 2023-08-10 LAB — COMPREHENSIVE METABOLIC PANEL
Albumin: 3.8 (ref 3.5–5.0)
Calcium: 10.3 (ref 8.7–10.7)
Globulin: 3.1
eGFR: 60

## 2023-08-10 LAB — HEPATIC FUNCTION PANEL
ALT: 11 U/L (ref 10–40)
AST: 19 (ref 14–40)
Alkaline Phosphatase: 75 (ref 25–125)
Bilirubin, Total: 0.7

## 2023-08-10 LAB — BASIC METABOLIC PANEL
BUN: 19 (ref 4–21)
CO2: 25 — AB (ref 13–22)
Chloride: 100 (ref 99–108)
Creatinine: 1.2 (ref 0.6–1.3)
Glucose: 114
Potassium: 4.5 meq/L (ref 3.5–5.1)
Sodium: 131 — AB (ref 137–147)

## 2023-08-10 LAB — CBC AND DIFFERENTIAL
HCT: 32 — AB (ref 41–53)
Hemoglobin: 10.5 — AB (ref 13.5–17.5)
Neutrophils Absolute: 6547
Platelets: 347 10*3/uL (ref 150–400)
WBC: 9.3

## 2023-08-10 LAB — CBC: RBC: 3.51 — AB (ref 3.87–5.11)

## 2023-08-10 NOTE — Progress Notes (Unsigned)
 Location:   SNF [facility] Room Number: 18 Place of Service:  SNF (31) Provider: Chipper Oman NP  No primary care provider on file.  Patient Care Team: Georgeanna Lea, MD as PCP - Cardiology (Cardiology)  Extended Emergency Contact Information Primary Emergency Contact: Vollmer,[name] Address: [address]           [facility] [address]          [place], [place] [address] [name] of [place] Home Phone: [phone] Mobile Phone: [phone] Relation: Spouse Secondary Emergency Contact: [name] Address: [address].          [place], [place] [address] [place] Home Phone: [phone] Mobile Phone: [phone] Relation: Son  Code Status: DNR Goals of care: Advanced Directive information    07/27/2023    1:24 PM  Advanced Directives  Does Patient Have a Medical Advance Directive? Yes  Type of Advance Directive Out of facility DNR (pink MOST or yellow form)  Does patient want to make changes to medical advance directive? No - Patient declined  Pre-existing out of facility DNR order (yellow form or pink MOST form) Yellow form placed in chart (order not valid for inpatient use)     Chief Complaint  Patient presents with   Acute Visit    Generalized weakness, needs assistance for transfer    HPI:  Pt is a 89 y.o. male seen today for an acute visit for generalized weakness, unable to stand up w/o assistance, no focal neurological symptoms, in his usual state of health    HOH L>R, HPOA-wife declined hearing aids.              Parkinson's disease, stopped parkinson's medication because of affected his cognition, had resting tremor in left arm/leg in the past             OP 12/01/22 dc'd Alendronate tscore -2.9 2017, -0.9 01/19/19, refused DEXA 11/02/22             OA shoulders pain, worsens at night, limited abduction ROM over shoulder level, hx of neck clicks with ROM, aches, c/o right sciatica pain, worsens in am, chronic left  knee pain,  Norco and Gabapentin for pain control.              Cognitive impairment, SNF [facility] for supportive care, 07/16/22 MMSE 18/30, taking Deonepezil sine 8/21/2 by Psych             CVA, no focal weakness residual.              Afib, off Diltiazem 04/09/23 2/2 edema), decreased Metoprolol to 12.5mg  2/2 bradycardia, taking  Eliquis             Bradycardia, decreased Metoprolol 12.5mg  every day (switched to Metoprolol from Diltiazem 04/09/23 2/2 to edema BLE), Zio  on, Echo 2/20//25             Bipolar disorder, followed by psychiatry, on Lithium, TSH 1.42 02/09/23, 07/16/22 Lithium level 0.6(0.6-1.2)             Insomnia, takes Trazodone since 02/17/23 by psych             CHF, EF 45-50% 11/2021, taking Furosemide, trace edema BLE, 05/10/23 CXR-mild pulmonary venous congestion, no infiltrate             Gait abnormality, uses w/c for mobility              Hyponatremia, Na 139 07/15/23  Hyperbilirubinemia, bilirubin 0.6 07/15/23            Polycythemia,  Hgb 11.1 07/15/23             CKD, Bun/creat 24/1.1 07/15/23             Hypogonadism, hx of  testosterone inj             Hyperlipidemia, takes Pravastatin             Glaucoma, eye drops.    Past Medical History:  Diagnosis Date   AF (paroxysmal atrial fibrillation) (HCC) 02/16/2018   Atrial fibrillation (HCC)    Atrial fibrillation with RVR (HCC) 02/16/2018   Bipolar 1 disorder (HCC)    Bipolar disorder (HCC) 02/17/2018   Dizziness 02/26/2020   DJD (degenerative joint disease)    Dyslipidemia 02/26/2020   Gait instability 02/16/2018   Hypertension    Mitral regurgitation    mild    Polycythemia 02/16/2018   Renal insufficiency    Stroke [facility])    Tremor 02/16/2018   Tremors of nervous system 01/2018   Past Surgical History:  Procedure Laterality Date   SHOULDER SURGERY      Allergies  Allergen Reactions   Lipitor [Atorvastatin] Other (See Comments)    Joint soreness    Allergies as of 08/10/2023       Reactions    Lipitor [atorvastatin] Other (See Comments)   Joint soreness        Medication List        Accurate as of August 10, 2023 11:59 PM. If you have any questions, ask your nurse or doctor.          ascorbic acid 500 MG tablet Commonly known as: VITAMIN C Take 500 mg by mouth daily.   Calcium 600+D Plus Minerals 600-400 MG-UNIT Chew Chew 1 Piece by mouth in the morning and at bedtime.   docusate sodium 100 MG capsule Commonly known as: Colace Take 1 capsule (100 mg total) by mouth 2 (two) times daily.   donepezil 5 MG tablet Commonly known as: ARICEPT Take 5 mg by mouth at bedtime.   Eliquis 5 MG Tabs tablet Generic drug: apixaban TAKE 1 TABLET BY MOUTH TWICE DAILY.   furosemide 20 MG tablet Commonly known as: LASIX Take 20 mg by mouth daily.   gabapentin 100 MG capsule Commonly known as: NEURONTIN Take 200 mg by mouth at bedtime.   HYDROcodone-acetaminophen 5-325 MG tablet Commonly known as: NORCO/VICODIN Take 0.5 tablets by mouth in the morning and at bedtime.   latanoprost 0.005 % ophthalmic solution Commonly known as: XALATAN Place 1 drop into both eyes at bedtime.   lithium carbonate 300 MG ER tablet Commonly known as: LITHOBID Take 1 tablet (300 mg total) by mouth every evening.   metoprolol succinate 25 MG 24 hr tablet Commonly known as: TOPROL-XL Take 12.5 mg by mouth daily.   multivitamin with minerals Tabs tablet Take 1 tablet by mouth daily.   potassium chloride 10 MEQ CR capsule Commonly known as: MICRO-K Take 10 mEq by mouth daily.   pravastatin 20 MG tablet Commonly known as: PRAVACHOL Take 20 mg by mouth at bedtime.   sodium chloride 5 % ophthalmic solution Commonly known as: MURO 128 Place 1 drop into both eyes at bedtime.   Systane 0.4-0.3 % Soln Generic drug: Polyethyl Glycol-Propyl Glycol Apply 1 drop to eye every 4 (four) hours as needed (dry eyes). And four times a day scheduled   tamsulosin 0.4 MG  Caps capsule Commonly  known as: FLOMAX Take 1 capsule (0.4 mg total) by mouth daily.   timolol 0.5 % ophthalmic solution Commonly known as: BETIMOL Place 1 drop into both eyes daily.   traZODone 50 MG tablet Commonly known as: DESYREL Take 1 tablet (50 mg total) by mouth at bedtime.   triamcinolone cream 0.1 % Commonly known as: KENALOG Apply 1 Application topically 2 (two) times daily.        Review of Systems  Constitutional:  Positive for fatigue. Negative for appetite change and fever.  HENT:  Positive for hearing loss. Negative for congestion and trouble swallowing.   Eyes:  Negative for visual disturbance.  Respiratory:  Negative for cough.   Cardiovascular:  Positive for leg swelling.  Gastrointestinal:  Negative for abdominal pain and constipation.  Genitourinary:  Negative for dysuria and urgency.       Incontinent of urine.   Musculoskeletal:  Positive for arthralgias, back pain and gait problem.       Shoulders pain with above shoulder level abduction ROM, worsens at night. R sciatic pain, not new, worse in am when getting up, chronic left knee pain Worsened left neck/shoulder pain /tingling, numbness arm, no decreased muscle strength.   Skin:  Negative for color change.  Neurological:  Negative for speech difficulty, weakness and headaches.  Psychiatric/Behavioral:  Positive for confusion and sleep disturbance. The patient is not nervous/anxious.        Early am awake, chronic    Immunization History  Administered Date(s) Administered   Hepatitis B, ADULT 11/26/1997, 12/27/1997, 05/08/1998   Influenza, High Dose Seasonal PF 03/31/2023   Influenza-Unspecified 04/15/2022   Moderna Covid-19 Vaccine Bivalent Booster 84yrs & up 04/14/2023   PPD Test 07/08/2022   Pneumococcal Conjugate-13 04/26/2014   Pneumococcal Polysaccharide-23 04/14/2005   RSV,unspecified 08/19/2022   Td 08/04/2022   Tdap 05/18/2016   Zoster Recombinant(Shingrix) 07/29/2022, 10/28/2022   Pertinent  Health  Maintenance Due  Topic Date Due   INFLUENZA VACCINE  Completed      07/08/2022    9:22 AM 07/10/2022   11:13 AM 08/03/2022    4:30 PM 08/25/2022   11:49 AM 09/24/2022    1:55 PM  Fall Risk  Falls in the past year?  1 1 1  0  Was there an injury with Fall?  1 1 0 0  Fall Risk Category Calculator  3 3 1  0  Fall Risk Category (Retired)  High     (RETIRED) Patient Fall Risk Level High fall risk High fall risk     Patient at Risk for Falls Due to  History of fall(s);Impaired balance/gait;Impaired mobility History of fall(s);Impaired balance/gait;Impaired mobility No Fall Risks   Fall risk Follow up  Falls evaluation completed Falls evaluation completed Falls evaluation completed    Functional Status Survey:    Vitals:   08/10/23 1032  BP: 119/68  Pulse: (!) 58  Resp: 18  Temp: (!) 97.4 F (36.3 C)  SpO2: 100%  Weight: 187 lb (84.8 kg)   Body mass index is 30.18 kg/m. Physical Exam Vitals and nursing note reviewed.  Constitutional:      Appearance: Normal appearance.  HENT:     Head: Normocephalic and atraumatic.     Nose: Nose normal.     Mouth/Throat:     Mouth: Mucous membranes are moist.  Eyes:     Extraocular Movements: Extraocular movements intact.     Conjunctiva/sclera: Conjunctivae normal.     Pupils: Pupils are equal, round, and  reactive to light.  Cardiovascular:     Rate and Rhythm: Bradycardia present. Rhythm irregular.     Heart sounds: No murmur heard. Pulmonary:     Effort: Pulmonary effort is normal.     Breath sounds: No rales.  Abdominal:     General: Bowel sounds are normal.     Palpations: Abdomen is soft.     Tenderness: There is no abdominal tenderness.  Musculoskeletal:        General: Tenderness present. No swelling, deformity or signs of injury.     Cervical back: Normal range of motion and neck supple.     Right lower leg: Edema present.     Left lower leg: Edema present.     Comments: trace edema BLE.  Shoulders pain with above shoulder  level abduction ROM, worsens at night, R sciatica pain worse in am/getting out of bed, chronic left knee pain.  Skin:    General: Skin is warm and dry.     Comments: The right 5th toe nail yellow thick from previous examination   Neurological:     General: No focal deficit present.     Mental Status: He is alert and oriented to person, place, and time. Mental status is at baseline.     Motor: No weakness.     Gait: Gait abnormal.  Psychiatric:        Mood and Affect: Mood normal.        Behavior: Behavior normal.        Thought Content: Thought content normal.     Labs reviewed: Recent Labs    02/18/23 0000 05/07/23 0000 05/13/23 0000 05/25/23 0000 07/15/23 0000  NA 135* 138  --  137 139  K 4.7 4.6 4.8 4.8 4.3  CL 102 105  --  103 103  CO2 26* 26*  --  27* 27*  BUN 24* 23*  --  21 24*  CREATININE 1.1 1.2  --   --  1.1  CALCIUM 10.5 10.2  --  10.7 10.2   Recent Labs    02/18/23 0000 05/25/23 0000 07/15/23 0000  AST 17 19 20   ALT 9* 13 10  ALKPHOS 77 84 82  ALBUMIN 3.9 3.8 3.7   Recent Labs    02/18/23 0000 05/25/23 0000 07/15/23 0000  WBC 6.9 10.6 9.1  NEUTROABS 4,637.00 7,590.00 8,199.00  HGB 12.9* 11.9* 11.1*  HCT 39* 36* 34*  PLT 247 322 296   Lab Results  Component Value Date   TSH 1.42 02/09/2023   Lab Results  Component Value Date   HGBA1C  08/24/2009    5.7 (NOTE) The ADA recommends the following therapeutic goal for glycemic control related to Hgb A1c measurement: Goal of therapy: <6.5 Hgb A1c  Reference: American Diabetes Association: Clinical Practice Recommendations 2010, Diabetes Care, 2010, 33: (Suppl  1).   Lab Results  Component Value Date   CHOL 116 12/03/2022   HDL 46 12/03/2022   LDLCALC 54 12/03/2022   LDLDIRECT 55 09/02/2020   TRIG 80 12/03/2022   CHOLHDL 3.3 09/02/2020    Significant Diagnostic Results in last 30 days:  No results found.  Assessment/Plan: Generalized weakness generalized weakness, unable to stand up  w/o assistance, no focal neurological symptoms, in his usual state of health CBC/diff, CMP/eGFR stat, VS and neuro check q shift x 72 hours.   Chronic CHF (HCC) EF 45-50% 11/2021, taking Furosemide, trace edema BLE, 05/10/23 CXR-mild pulmonary venous congestion, no infiltrate  Insomnia Better, takes Trazodone  since 02/17/23 by psych  Bipolar disorder (HCC) Anxious at times,  followed by psychiatry, on Lithium, TSH 1.42 02/09/23, 07/16/22 Lithium level 0.6(0.6-1.2)  Bradycardia decreased Metoprolol 12.5mg  every day (switched to Metoprolol from Diltiazem 04/09/23 2/2 to edema BLE), Zio  on, Echo 2/20//25  Atrial fibrillation, persistent (HCC) HR in 50s baseline, off Diltiazem 04/09/23 2/2 edema), decreased Metoprolol to 12.5mg  2/2 bradycardia, taking  Eliquis  History of stroke no focal weakness residual.   Cognitive impairment SNF [facility] for supportive care, 07/16/22 MMSE 18/30, taking Deonepezil sine 8/21/2 by Psych  Osteoarthritis, multiple sites worsens at night, limited abduction ROM over shoulder level, hx of neck clicks with ROM, aches, c/o right sciatica pain, worsens in am, chronic left knee pain,  Norco and Gabapentin for pain control.  Tremors of nervous system Parkinson's disease, stopped parkinson's medication because of affected his cognition, had resting tremor in left arm/leg in the past, able to feed self.     Family/ staff Communication: plan of care reviewed with the patient and charge nurse.   Labs/tests ordered:  CBC/diff, CMP/eGFR

## 2023-08-10 NOTE — Assessment & Plan Note (Signed)
generalized weakness, unable to stand up w/o assistance, no focal neurological symptoms, in his usual state of health CBC/diff, CMP/eGFR stat, VS and neuro check q shift x 72 hours.

## 2023-08-13 ENCOUNTER — Encounter: Payer: Self-pay | Admitting: Nurse Practitioner

## 2023-08-13 NOTE — Assessment & Plan Note (Signed)
no focal weakness residual.

## 2023-08-13 NOTE — Assessment & Plan Note (Signed)
EF 45-50% 11/2021, taking Furosemide, trace edema BLE, 05/10/23 CXR-mild pulmonary venous congestion, no infiltrate

## 2023-08-13 NOTE — Assessment & Plan Note (Signed)
Parkinson's disease, stopped parkinson's medication because of affected his cognition, had resting tremor in left arm/leg in the past, able to feed self.

## 2023-08-13 NOTE — Assessment & Plan Note (Signed)
decreased Metoprolol 12.5mg  every day (switched to Metoprolol from Diltiazem 04/09/23 2/2 to edema BLE), Zio  on, Echo 2/20//25

## 2023-08-13 NOTE — Assessment & Plan Note (Signed)
worsens at night, limited abduction ROM over shoulder level, hx of neck clicks with ROM, aches, c/o right sciatica pain, worsens in am, chronic left knee pain,  Norco and Gabapentin for pain control.

## 2023-08-13 NOTE — Assessment & Plan Note (Signed)
HR in 50s baseline, off Diltiazem 04/09/23 2/2 edema), decreased Metoprolol to 12.5mg  2/2 bradycardia, taking  Eliquis

## 2023-08-13 NOTE — Assessment & Plan Note (Signed)
Better, takes Trazodone since 02/17/23 by psych

## 2023-08-13 NOTE — Assessment & Plan Note (Signed)
Anxious at times,  followed by psychiatry, on Lithium, TSH 1.42 02/09/23, 07/16/22 Lithium level 0.6(0.6-1.2)

## 2023-08-13 NOTE — Assessment & Plan Note (Signed)
SNF [facility] for supportive care, 07/16/22 MMSE 18/30, taking Deonepezil sine 8/21/2 by Merilyn Baba

## 2023-08-17 DIAGNOSIS — I10 Essential (primary) hypertension: Secondary | ICD-10-CM | POA: Diagnosis not present

## 2023-08-17 LAB — BASIC METABOLIC PANEL
BUN: 17 (ref 4–21)
CO2: 28 — AB (ref 13–22)
Chloride: 104 (ref 99–108)
Creatinine: 1.2 (ref 0.6–1.3)
Glucose: 91
Potassium: 4.7 meq/L (ref 3.5–5.1)
Sodium: 138 (ref 137–147)

## 2023-08-17 LAB — COMPREHENSIVE METABOLIC PANEL
Calcium: 10.5 (ref 8.7–10.7)
eGFR: 61

## 2023-08-18 ENCOUNTER — Other Ambulatory Visit: Payer: Self-pay | Admitting: Orthopedic Surgery

## 2023-08-18 DIAGNOSIS — M150 Primary generalized (osteo)arthritis: Secondary | ICD-10-CM

## 2023-08-18 MED ORDER — HYDROCODONE-ACETAMINOPHEN 5-325 MG PO TABS
0.5000 | ORAL_TABLET | Freq: Two times a day (BID) | ORAL | 0 refills | Status: DC
Start: 2023-08-18 — End: 2024-03-15

## 2023-08-19 ENCOUNTER — Ambulatory Visit (HOSPITAL_COMMUNITY): Payer: Medicare Other | Attending: Cardiology

## 2023-08-19 ENCOUNTER — Other Ambulatory Visit (HOSPITAL_BASED_OUTPATIENT_CLINIC_OR_DEPARTMENT_OTHER): Payer: Medicare Other

## 2023-08-23 ENCOUNTER — Telehealth: Payer: Self-pay | Admitting: Cardiology

## 2023-08-23 ENCOUNTER — Telehealth: Payer: Self-pay

## 2023-08-23 ENCOUNTER — Other Ambulatory Visit: Payer: Self-pay

## 2023-08-23 DIAGNOSIS — I4821 Permanent atrial fibrillation: Secondary | ICD-10-CM

## 2023-08-23 NOTE — Telephone Encounter (Signed)
 Emil with [facility] calling with abnormal monitor results

## 2023-08-23 NOTE — Telephone Encounter (Signed)
 Spoke with Dr. Dulce Sellar and he recommended below that the patient follow up with EP:  "Monitor ordered for bradycardia has a history of cardiomyopathy with a normal ejection fraction he had one 4-second pause atrial fibrillation burden 41% and he had 1 episode of nonsustained VT 12 complexes monomorphic rate of 154 bpm.  Will refer to EP".  Attempted to call patient to inform him of Dr. Hulen Shouts recommendation and he did not answer the phone. A message was left for him to call the office back. An EP consult was placed for this patient via Epic.

## 2023-08-23 NOTE — Telephone Encounter (Signed)
 Spoke with Irving Burton at [facility] regarding a zio alert for this patient. Irving Burton reported that the patient had 4 second pause. 41% burden of A-fib/ flutter with a low heart rate of 38 bpm for 60 seconds which is on strip 11 page 19. Patient also had 1 run of V-tach.

## 2023-08-23 NOTE — Telephone Encounter (Signed)
 Spoke with Dr. Dulce Sellar and he stated that he spoke with Dr. Elberta Fortis and Dr. Elberta Fortis recommended stopping the patients Metoprolol. The patient's metoprolol was discontinued. Will continue efforts to contact the patient.

## 2023-08-23 NOTE — Telephone Encounter (Signed)
 Called [employer] who states that they spoke with Sean Bishop.

## 2023-08-24 NOTE — Telephone Encounter (Signed)
 Left message for the [facility]) to call back.

## 2023-08-24 NOTE — Telephone Encounter (Signed)
 Equities trader and she gave me a fax number and asked that we fax her a copy of the heart monitor result and the documentation supporting the changes to the patient's chart to her. The documentation requested was faxed to her at the number provided.

## 2023-08-24 NOTE — Telephone Encounter (Signed)
 Amber with [facility] is returning call.

## 2023-08-24 NOTE — Telephone Encounter (Signed)
 Called the patient and his wife answered the phone and informed me that he was currently in the [facility]).  The phone number to the facility is [phone]. She asked that I contact them to update them regarding the patient's care.

## 2023-08-25 NOTE — Telephone Encounter (Signed)
 Equities trader at the [facility] in [place] to verify that they had received the fax with the heart monitor results and the other documentation supporting the changes made to the patient's chart. She stated that they had received the information and had updated the patient's medication list. Amber had no further questions at this time.

## 2023-08-30 ENCOUNTER — Telehealth: Payer: Self-pay

## 2023-08-30 NOTE — Telephone Encounter (Signed)
 Completed by Dr. Dulce Sellar- pt notified. Metoprolol D/C'd. Will follow up with Dr. Elberta Fortis.

## 2023-08-31 ENCOUNTER — Non-Acute Institutional Stay (SKILLED_NURSING_FACILITY): Payer: Self-pay | Admitting: Nurse Practitioner

## 2023-08-31 ENCOUNTER — Encounter: Payer: Self-pay | Admitting: Nurse Practitioner

## 2023-08-31 DIAGNOSIS — M810 Age-related osteoporosis without current pathological fracture: Secondary | ICD-10-CM

## 2023-08-31 DIAGNOSIS — M150 Primary generalized (osteo)arthritis: Secondary | ICD-10-CM | POA: Diagnosis not present

## 2023-08-31 DIAGNOSIS — F319 Bipolar disorder, unspecified: Secondary | ICD-10-CM

## 2023-08-31 DIAGNOSIS — D751 Secondary polycythemia: Secondary | ICD-10-CM

## 2023-08-31 DIAGNOSIS — R251 Tremor, unspecified: Secondary | ICD-10-CM

## 2023-08-31 DIAGNOSIS — F5101 Primary insomnia: Secondary | ICD-10-CM

## 2023-08-31 DIAGNOSIS — R269 Unspecified abnormalities of gait and mobility: Secondary | ICD-10-CM

## 2023-08-31 DIAGNOSIS — I5042 Chronic combined systolic (congestive) and diastolic (congestive) heart failure: Secondary | ICD-10-CM

## 2023-08-31 DIAGNOSIS — N1831 Chronic kidney disease, stage 3a: Secondary | ICD-10-CM

## 2023-08-31 DIAGNOSIS — E871 Hypo-osmolality and hyponatremia: Secondary | ICD-10-CM

## 2023-08-31 DIAGNOSIS — R350 Frequency of micturition: Secondary | ICD-10-CM | POA: Diagnosis not present

## 2023-08-31 DIAGNOSIS — R4189 Other symptoms and signs involving cognitive functions and awareness: Secondary | ICD-10-CM

## 2023-08-31 DIAGNOSIS — E785 Hyperlipidemia, unspecified: Secondary | ICD-10-CM

## 2023-08-31 DIAGNOSIS — I4819 Other persistent atrial fibrillation: Secondary | ICD-10-CM

## 2023-08-31 NOTE — Assessment & Plan Note (Signed)
 SNF [facility] for supportive care, 07/16/22 MMSE 18/30, taking Deonepezil sine 8/21/2 by Merilyn Baba

## 2023-08-31 NOTE — Assessment & Plan Note (Signed)
 Bun/creat 17/1.15 08/17/23

## 2023-08-31 NOTE — Assessment & Plan Note (Signed)
 Na 138 08/17/23

## 2023-08-31 NOTE — Assessment & Plan Note (Signed)
 EF 45-50% 11/2021, taking Furosemide, trace edema BLE, 05/10/23 CXR-mild pulmonary venous congestion, no infiltrate

## 2023-08-31 NOTE — Assessment & Plan Note (Signed)
 Hgb 11.1 07/15/23

## 2023-08-31 NOTE — Progress Notes (Unsigned)
 Location:   SNF [facility] Room Number: N018-A Place of Service:  SNF (31) Provider: Chipper Oman NP  No primary care provider on file.  Patient Care Team: Georgeanna Lea, MD as PCP - Cardiology (Cardiology)  Extended Emergency Contact Information Primary Emergency Contact: Piotrowski,[name] Address: [address]           [facility] [address]          [place], [place] [address] [name] Home Phone: [phone] Mobile Phone: [phone] Relation: Spouse Secondary Emergency Contact: [name] Address: [address]          [place], [place] [address] [place] Home Phone: [phone] Mobile Phone: [phone] Relation: Son  Code Status:  DNR Goals of care: Advanced Directive information    08/31/2023    4:10 PM  Advanced Directives  Does Patient Have a Medical Advance Directive? Yes  Type of Advance Directive Out of facility DNR (pink MOST or yellow form)  Does patient want to make changes to medical advance directive? No - Patient declined  Pre-existing out of facility DNR order (yellow form or pink MOST form) Yellow form placed in chart (order not valid for inpatient use)     Chief Complaint  Patient presents with   Medical Management of Chronic Issues    Routine Visit   Immunizations    Covid    HPI:  Pt is a 89 y.o. male seen today for medical management of chronic diseases.    Urinary frequency, taking Tamsulosin.    HOH L>R, HPOA-wife declined hearing aids.              Parkinson's disease, stopped parkinson's medication because of affected his cognition, had resting tremor in left arm/leg in the past             OP 12/01/22 dc'd Alendronate tscore -2.9 2017, -0.9 01/19/19, refused DEXA 11/02/22             OA shoulders pain, worsens at night, limited abduction ROM over shoulder level, hx of neck clicks with ROM, aches, c/o right sciatica pain, worsens in am, chronic left knee pain,  Norco and Gabapentin for pain  control.              Cognitive impairment, SNF [facility] for supportive care, 07/16/22 MMSE 18/30, taking Deonepezil sine 8/21/2 by Psych             CVA, no focal weakness residual.              Afib, taking  Eliquis             Bradycardia, off Metoprolol, Diltiazem 2/2 to bradycardia. 08/24/23 38/60 sec with V tach, stopped Metoprolol per cardiology             Bipolar disorder, followed by psychiatry, on Lithium, TSH 1.42 02/09/23, 07/16/22 Lithium level 0.6(0.6-1.2)             Insomnia, takes Trazodone since 02/17/23 by psych             CHF, EF 45-50% 11/2021, taking Furosemide, trace edema BLE, 05/10/23 CXR-mild pulmonary venous congestion, no infiltrate             Gait abnormality, uses w/c for mobility              Hyponatremia, Na 138 08/17/23             Hyperbilirubinemia, bilirubin 0.6 07/15/23  Polycythemia,  Hgb 11.1 07/15/23             CKD, Bun/creat 17/1.15 08/17/23             Hypogonadism, hx of  testosterone inj             Hyperlipidemia, takes Pravastatin             Glaucoma, eye drops.     Past Medical History:  Diagnosis Date   AF (paroxysmal atrial fibrillation) (HCC) 02/16/2018   Atrial fibrillation (HCC)    Atrial fibrillation with RVR (HCC) 02/16/2018   Bipolar 1 disorder (HCC)    Bipolar disorder (HCC) 02/17/2018   Dizziness 02/26/2020   DJD (degenerative joint disease)    Dyslipidemia 02/26/2020   Gait instability 02/16/2018   Hypertension    Mitral regurgitation    mild    Polycythemia 02/16/2018   Renal insufficiency    Stroke [facility])    Tremor 02/16/2018   Tremors of nervous system 01/2018   Past Surgical History:  Procedure Laterality Date   SHOULDER SURGERY      Allergies  Allergen Reactions   Lipitor [Atorvastatin] Other (See Comments)    Joint soreness    Allergies as of 08/31/2023       Reactions   Lipitor [atorvastatin] Other (See Comments)   Joint soreness        Medication List        Accurate as of August 31, 2023 11:59 PM. If  you have any questions, ask your nurse or doctor.          ascorbic acid 500 MG tablet Commonly known as: VITAMIN C Take 500 mg by mouth daily.   Calcium 600+D Plus Minerals 600-400 MG-UNIT Chew Chew 1 Piece by mouth in the morning and at bedtime.   docusate sodium 100 MG capsule Commonly known as: Colace Take 1 capsule (100 mg total) by mouth 2 (two) times daily.   donepezil 5 MG tablet Commonly known as: ARICEPT Take 5 mg by mouth at bedtime.   Eliquis 5 MG Tabs tablet Generic drug: apixaban TAKE 1 TABLET BY MOUTH TWICE DAILY.   furosemide 20 MG tablet Commonly known as: LASIX Take 20 mg by mouth daily.   gabapentin 100 MG capsule Commonly known as: NEURONTIN Take 200 mg by mouth at bedtime.   HYDROcodone-acetaminophen 5-325 MG tablet Commonly known as: NORCO/VICODIN Take 0.5 tablets by mouth in the morning and at bedtime.   latanoprost 0.005 % ophthalmic solution Commonly known as: XALATAN Place 1 drop into both eyes at bedtime.   lithium carbonate 300 MG ER tablet Commonly known as: LITHOBID Take 1 tablet (300 mg total) by mouth every evening.   multivitamin with minerals Tabs tablet Take 1 tablet by mouth daily.   potassium chloride 10 MEQ CR capsule Commonly known as: MICRO-K Take 10 mEq by mouth daily.   pravastatin 20 MG tablet Commonly known as: PRAVACHOL Take 20 mg by mouth at bedtime.   sodium chloride 5 % ophthalmic solution Commonly known as: MURO 128 Place 1 drop into both eyes at bedtime.   Systane 0.4-0.3 % Soln Generic drug: Polyethyl Glycol-Propyl Glycol Apply 1 drop to eye every 4 (four) hours as needed (dry eyes). And four times a day scheduled   tamsulosin 0.4 MG Caps capsule Commonly known as: FLOMAX Take 1 capsule (0.4 mg total) by mouth daily.   timolol 0.5 % ophthalmic solution Commonly known as: BETIMOL Place 1 drop into both eyes daily.  traZODone 50 MG tablet Commonly known as: DESYREL Take 1 tablet (50 mg total)  by mouth at bedtime.   triamcinolone cream 0.1 % Commonly known as: KENALOG Apply 1 Application topically 2 (two) times daily.        Review of Systems  Constitutional:  Negative for appetite change, fatigue and fever.  HENT:  Positive for hearing loss. Negative for congestion and trouble swallowing.   Eyes:  Negative for visual disturbance.  Respiratory:  Negative for cough.   Cardiovascular:  Positive for leg swelling.  Gastrointestinal:  Negative for abdominal pain and constipation.  Genitourinary:  Negative for dysuria and urgency.       Incontinent of urine.   Musculoskeletal:  Positive for arthralgias, back pain and gait problem.       Shoulders pain with above shoulder level abduction ROM, worsens at night. R sciatic pain, not new, worse in am when getting up, chronic left knee pain Worsened left neck/shoulder pain /tingling, numbness arm, no decreased muscle strength.   Skin:  Negative for color change.  Neurological:  Negative for speech difficulty, weakness and headaches.  Psychiatric/Behavioral:  Positive for confusion and sleep disturbance. The patient is not nervous/anxious.        Early am awake, chronic    Immunization History  Administered Date(s) Administered   Hepatitis B, ADULT 11/26/1997, 12/27/1997, 05/08/1998   Influenza, High Dose Seasonal PF 03/31/2023   Influenza-Unspecified 04/15/2022   Moderna Covid-19 Vaccine Bivalent Booster 43yrs & up 04/14/2023   PPD Test 07/08/2022   Pneumococcal Conjugate-13 04/26/2014   Pneumococcal Polysaccharide-23 04/14/2005   RSV,unspecified 08/19/2022   Td 08/04/2022   Tdap 05/18/2016   Zoster Recombinant(Shingrix) 07/29/2022, 10/28/2022   Pertinent  Health Maintenance Due  Topic Date Due   INFLUENZA VACCINE  Completed      07/08/2022    9:22 AM 07/10/2022   11:13 AM 08/03/2022    4:30 PM 08/25/2022   11:49 AM 09/24/2022    1:55 PM  Fall Risk  Falls in the past year?  1 1 1  0  Was there an injury with Fall?  1 1  0 0  Fall Risk Category Calculator  3 3 1  0  Fall Risk Category (Retired)  High     (RETIRED) Patient Fall Risk Level High fall risk High fall risk     Patient at Risk for Falls Due to  History of fall(s);Impaired balance/gait;Impaired mobility History of fall(s);Impaired balance/gait;Impaired mobility No Fall Risks   Fall risk Follow up  Falls evaluation completed Falls evaluation completed Falls evaluation completed    Functional Status Survey:    Vitals:   08/31/23 1556  BP: 125/71  Pulse: 78  Resp: 18  Temp: 97.7 F (36.5 C)  SpO2: 98%  Weight: 190 lb 12.8 oz (86.5 kg)  Height: 5\' 6"  (1.676 m)   Body mass index is 30.8 kg/m. Physical Exam Vitals and nursing note reviewed.  Constitutional:      Appearance: Normal appearance.  HENT:     Head: Normocephalic and atraumatic.     Nose: Nose normal.     Mouth/Throat:     Mouth: Mucous membranes are moist.  Eyes:     Extraocular Movements: Extraocular movements intact.     Conjunctiva/sclera: Conjunctivae normal.     Pupils: Pupils are equal, round, and reactive to light.  Cardiovascular:     Rate and Rhythm: Bradycardia present. Rhythm irregular.     Heart sounds: No murmur heard. Pulmonary:     Effort:  Pulmonary effort is normal.     Breath sounds: No rales.  Abdominal:     General: Bowel sounds are normal.     Palpations: Abdomen is soft.     Tenderness: There is no abdominal tenderness.  Musculoskeletal:        General: Tenderness present. No swelling, deformity or signs of injury.     Cervical back: Normal range of motion and neck supple.     Right lower leg: Edema present.     Left lower leg: Edema present.     Comments: trace edema BLE.  Shoulders pain with above shoulder level abduction ROM, worsens at night, R sciatica pain worse in am/getting out of bed, chronic left knee pain.  Skin:    General: Skin is warm and dry.     Comments: The right 5th toe nail yellow thick from previous examination    Neurological:     General: No focal deficit present.     Mental Status: He is alert and oriented to person, place, and time. Mental status is at baseline.     Motor: No weakness.     Gait: Gait abnormal.  Psychiatric:        Mood and Affect: Mood normal.        Behavior: Behavior normal.        Thought Content: Thought content normal.     Labs reviewed: Recent Labs    07/15/23 0000 08/10/23 0000 08/17/23 0000  NA 139 131* 138  K 4.3 4.5 4.7  CL 103 100 104  CO2 27* 25* 28*  BUN 24* 19 17  CREATININE 1.1 1.2 1.2  CALCIUM 10.2 10.3 10.5   Recent Labs    05/25/23 0000 07/15/23 0000 08/10/23 0000  AST 19 20 19   ALT 13 10 11   ALKPHOS 84 82 75  ALBUMIN 3.8 3.7 3.8   Recent Labs    05/25/23 0000 07/15/23 0000 08/10/23 0000  WBC 10.6 9.1 9.3  NEUTROABS 7,590.00 8,199.00 6,547.00  HGB 11.9* 11.1* 10.5*  HCT 36* 34* 32*  PLT 322 296 347   Lab Results  Component Value Date   TSH 1.42 02/09/2023   Lab Results  Component Value Date   HGBA1C  08/24/2009    5.7 (NOTE) The ADA recommends the following therapeutic goal for glycemic control related to Hgb A1c measurement: Goal of therapy: <6.5 Hgb A1c  Reference: American Diabetes Association: Clinical Practice Recommendations 2010, Diabetes Care, 2010, 33: (Suppl  1).   Lab Results  Component Value Date   CHOL 116 12/03/2022   HDL 46 12/03/2022   LDLCALC 54 12/03/2022   LDLDIRECT 55 09/02/2020   TRIG 80 12/03/2022   CHOLHDL 3.3 09/02/2020    Significant Diagnostic Results in last 30 days:  LONG TERM MONITOR (3-14 DAYS) Result Date: 08/23/2023 Patch Wear Time:  13 days and 23 hours (2025-01-28T11:40:50-0500 to 2025-02-11T11:40:42-0500) Patient had a min HR of 32 bpm, max HR of 172 bpm, and avg HR of 69 bpm. Predominant underlying rhythm was Sinus Rhythm. First Degree AV Block was present. There were no triggered or diary events. 1 pause of 4.1 seconds is seen and is difficult to determine the baseline but it  appears to occur during atrial fibrillation. 1 run of Ventricular Tachycardia occurred lasting 12 beats with a max rate of 154 bpm (avg 145 bpm). Atrial Fibrillation/Flutter occurred (41% burden), ranging from 32-172 bpm (avg of 77 bpm), the longest lasting 1 day 8 hours with an avg rate of 75  bpm. 1 Pause occurred lasting 4 secs (15 bpm).  Predominantly atrial fibrillation rate is generally controlled but also has clear atrial flutter documented with slower response. Isolated SVEs were occasional (2.3%, [id number]), SVE Couplets were rare (<1.0%, 239), and SVE Triplets were rare (<1.0%, 41). Isolated VEs were rare (<1.0%, 2585), VE Couplets were rare (<1.0%, 27), and VE Triplets were rare (<1.0%, 1). MD notification criteria for Slow Atrial Fibrillation/Atrial Flutter met - report posted prior to notification per account request (OL).    Assessment/Plan  Urinary frequency taking Tamsulosin, no urinary retention.   Tremors of nervous system stopped parkinson's medication because of affected his cognition, had resting tremor in left arm/leg in the past  Osteoporosis 12/01/22 dc'd Alendronate tscore -2.9 2017, -0.9 01/19/19, refused DEXA 11/02/22  Osteoarthritis, multiple sites  shoulders pain, worsens at night, limited abduction ROM over shoulder level, hx of neck clicks with ROM, aches, c/o right sciatica pain, worsens in am, chronic left knee pain,  Norco and Gabapentin for pain control.   Cognitive impairment  SNF [facility] for supportive care, 07/16/22 MMSE 18/30, taking Deonepezil sine 8/21/2 by Psych  Atrial fibrillation, persistent (HCC) taking  Eliquis. Bradycardia, off Metoprolol, Diltiazem 2/2 to bradycardia. 08/24/23 38/60 sec with V tach, stopped Metoprolol per cardiology  Bipolar disorder [facility]) followed by psychiatry, on Lithium, TSH 1.42 02/09/23, 07/16/22 Lithium level 0.6(0.6-1.2)  Insomnia takes Trazodone since 02/17/23 by psych  Chronic CHF (HCC) EF 45-50% 11/2021, taking Furosemide, trace  edema BLE, 05/10/23 CXR-mild pulmonary venous congestion, no infiltrate  Gait abnormality uses w/c for mobility   Hyponatremia Na 138 08/17/23  Polycythemia  Hgb 11.1 07/15/23  CKD (chronic kidney disease) stage 3, GFR 30-59 ml/min (HCC) Bun/creat 17/1.15 08/17/23  Dyslipidemia takes Pravastatin   Family/ staff Communication: plan of care reviewed with the patient and charge nurse.   Labs/tests ordered:  none

## 2023-08-31 NOTE — Assessment & Plan Note (Signed)
stopped parkinson's medication because of affected his cognition, had resting tremor in left arm/leg in the past

## 2023-08-31 NOTE — Assessment & Plan Note (Signed)
 shoulders pain, worsens at night, limited abduction ROM over shoulder level, hx of neck clicks with ROM, aches, c/o right sciatica pain, worsens in am, chronic left knee pain,  Norco and Gabapentin for pain control.

## 2023-08-31 NOTE — Assessment & Plan Note (Signed)
followed by psychiatry, on Lithium, TSH 1.42 02/09/23, 07/16/22 Lithium level 0.6(0.6-1.2)

## 2023-08-31 NOTE — Assessment & Plan Note (Signed)
takes Pravastatin 

## 2023-08-31 NOTE — Assessment & Plan Note (Signed)
 takes Trazodone since 02/17/23 by psych

## 2023-08-31 NOTE — Assessment & Plan Note (Signed)
 12/01/22 dc'd Alendronate tscore -2.9 2017, -0.9 01/19/19, refused DEXA 11/02/22

## 2023-08-31 NOTE — Assessment & Plan Note (Signed)
 taking Tamsulosin, no urinary retention.

## 2023-08-31 NOTE — Assessment & Plan Note (Addendum)
 taking  Eliquis. Bradycardia, off Metoprolol, Diltiazem 2/2 to bradycardia. 08/24/23 38/60 sec with V tach, stopped Metoprolol per cardiology

## 2023-08-31 NOTE — Assessment & Plan Note (Signed)
uses w/c for mobility ?

## 2023-09-01 ENCOUNTER — Encounter: Payer: Self-pay | Admitting: Nurse Practitioner

## 2023-09-10 ENCOUNTER — Ambulatory Visit (HOSPITAL_COMMUNITY): Payer: Medicare Other | Attending: Cardiology

## 2023-09-10 DIAGNOSIS — R0609 Other forms of dyspnea: Secondary | ICD-10-CM | POA: Insufficient documentation

## 2023-09-10 LAB — ECHOCARDIOGRAM COMPLETE
Area-P 1/2: 3.95 cm2
S' Lateral: 2.6 cm

## 2023-09-16 ENCOUNTER — Telehealth: Payer: Self-pay

## 2023-09-16 NOTE — Telephone Encounter (Signed)
 Left message on My Chart with Echo results per Dr. Vanetta Shawl note. Routed to PCP.

## 2023-09-20 ENCOUNTER — Telehealth: Payer: Self-pay

## 2023-09-20 NOTE — Telephone Encounter (Signed)
LVM per DPR- per Dr. Vanetta Shawl note regarding Echo results. Encouraged to call with any questions. Routed to PCP.

## 2023-09-30 ENCOUNTER — Non-Acute Institutional Stay (SKILLED_NURSING_FACILITY): Payer: Self-pay | Admitting: Sports Medicine

## 2023-09-30 DIAGNOSIS — R32 Unspecified urinary incontinence: Secondary | ICD-10-CM

## 2023-09-30 DIAGNOSIS — I4821 Permanent atrial fibrillation: Secondary | ICD-10-CM

## 2023-09-30 DIAGNOSIS — F319 Bipolar disorder, unspecified: Secondary | ICD-10-CM

## 2023-09-30 DIAGNOSIS — M159 Polyosteoarthritis, unspecified: Secondary | ICD-10-CM | POA: Diagnosis not present

## 2023-09-30 DIAGNOSIS — F0390 Unspecified dementia without behavioral disturbance: Secondary | ICD-10-CM

## 2023-09-30 DIAGNOSIS — E785 Hyperlipidemia, unspecified: Secondary | ICD-10-CM

## 2023-09-30 DIAGNOSIS — K5900 Constipation, unspecified: Secondary | ICD-10-CM

## 2023-09-30 DIAGNOSIS — F5101 Primary insomnia: Secondary | ICD-10-CM

## 2023-09-30 NOTE — Progress Notes (Signed)
 Provider:  Dr. Venita Sheffield Location:  [facility] Place of Service:   Skilled care Nursing   PCP: Pcp, No Patient Care Team: Pcp, No as PCP - General Georgeanna Lea, MD as PCP - Cardiology (Cardiology)  Extended Emergency Contact Information Primary Emergency Contact: Raisch,[name] Address: [phone]           [facility] [address]          [place], [place] [address] [name] Home Phone: [phone] Mobile Phone: [phone] Relation: Spouse Secondary Emergency Contact: [name] Address: [address]          [place], [place] [address] [place] Home Phone: [phone] Mobile Phone: [phone] Relation: Son  Goals of Care: Advanced Directive information    08/31/2023    4:10 PM  Advanced Directives  Does Patient Have a Medical Advance Directive? Yes  Type of Advance Directive Out of facility DNR (pink MOST or yellow form)  Does patient want to make changes to medical advance directive? No - Patient declined  Pre-existing out of facility DNR order (yellow form or pink MOST form) Yellow form placed in chart (order not valid for inpatient use)        History of Present Illness 89 yr old M with h/o  HTN, A fib, Dilated cardiomyopathy, GERD , dementia is evaluated for chronic disease management.  Pt seen and examined in the living room  C/o pain in multiple joints, shoulders, Rt leg States he received steroid injections in the past  Currently on norco prn, gabapentin   Constipation  C/o passing stools every 3 days    Major Neurocognitive disorder As per staff he is ambulating in the wheel chair, he can pivot himself but needs help with transferring  No agitation  He enjoys participating in group activities He shows me the picture of painting he did this morning.    Past Medical History:  Diagnosis Date   AF (paroxysmal atrial fibrillation) (HCC) 02/16/2018   Atrial fibrillation (HCC)     Atrial fibrillation with RVR (HCC) 02/16/2018   Bipolar 1 disorder (HCC)    Bipolar disorder (HCC) 02/17/2018   Dizziness 02/26/2020   DJD (degenerative joint disease)    Dyslipidemia 02/26/2020   Gait instability 02/16/2018   Hypertension    Mitral regurgitation    mild    Polycythemia 02/16/2018   Renal insufficiency    Stroke [facility])    Tremor 02/16/2018   Tremors of nervous system 01/2018   Past Surgical History:  Procedure Laterality Date   SHOULDER SURGERY      reports that he has quit smoking. He has never used smokeless tobacco. He reports current alcohol use of about 1.0 standard drink of alcohol per week. He reports that he does not use drugs. Social History   Socioeconomic History   Marital status: Married    Spouse name: Not on file   Number of children: Not on file   Years of education: Not on file   Highest education level: Not on file  Occupational History   Not on file  Tobacco Use   Smoking status: Former   Smokeless tobacco: Never  Vaping Use   Vaping status: Never Used  Substance and Sexual Activity   Alcohol use: Yes    Alcohol/week: 1.0 standard drink of alcohol    Types: 1 Glasses of wine per week    Comment: 1 per month   Drug use: No   Sexual activity:  Not on file  Other Topics Concern   Not on file  Social History Narrative   Not on file   Social Drivers of Health   Financial Resource Strain: Not on file  Food Insecurity: No Food Insecurity (07/02/2022)   Hunger Vital Sign    Worried About Running Out of Food in the Last Year: Never true    Ran Out of Food in the Last Year: Never true  Transportation Needs: No Transportation Needs (07/02/2022)   PRAPARE - Administrator, Civil Service (Medical): No    Lack of Transportation (Non-Medical): No  Physical Activity: Not on file  Stress: Not on file  Social Connections: Not on file  Intimate Partner Violence: Not At Risk (07/02/2022)   Humiliation, Afraid, Rape, and Kick questionnaire     Fear of Current or Ex-Partner: No    Emotionally Abused: No    Physically Abused: No    Sexually Abused: No    Functional Status Survey:    Family History  Problem Relation Age of Onset   Alcohol abuse Mother    Cancer Father     Health Maintenance  Topic Date Due   COVID-19 Vaccine (2 - Moderna risk series) 05/12/2023   INFLUENZA VACCINE  01/28/2024   Medicare Annual Wellness (AWV)  07/26/2024   DTaP/Tdap/Td (3 - Td or Tdap) 08/04/2032   Pneumonia Vaccine 25+ Years old  Completed   Zoster Vaccines- Shingrix  Completed   HPV VACCINES  Aged Out    Allergies  Allergen Reactions   Lipitor [Atorvastatin] Other (See Comments)    Joint soreness    Outpatient Encounter Medications as of 09/30/2023  Medication Sig   ascorbic acid (VITAMIN C) 500 MG tablet Take 500 mg by mouth daily.   Calcium Carbonate-Vit D-Min (CALCIUM 600+D PLUS MINERALS) 600-400 MG-UNIT CHEW Chew 1 Piece by mouth in the morning and at bedtime.   docusate sodium (COLACE) 100 MG capsule Take 1 capsule (100 mg total) by mouth 2 (two) times daily.   donepezil (ARICEPT) 5 MG tablet Take 5 mg by mouth at bedtime.   ELIQUIS 5 MG TABS tablet TAKE 1 TABLET BY MOUTH TWICE DAILY.   furosemide (LASIX) 20 MG tablet Take 20 mg by mouth daily.   gabapentin (NEURONTIN) 100 MG capsule Take 200 mg by mouth at bedtime.   HYDROcodone-acetaminophen (NORCO/VICODIN) 5-325 MG tablet Take 0.5 tablets by mouth in the morning and at bedtime.   latanoprost (XALATAN) 0.005 % ophthalmic solution Place 1 drop into both eyes at bedtime.   lithium carbonate (LITHOBID) 300 MG ER tablet Take 1 tablet (300 mg total) by mouth every evening.   Multiple Vitamin (MULTIVITAMIN WITH MINERALS) TABS tablet Take 1 tablet by mouth daily.   Polyethyl Glycol-Propyl Glycol (SYSTANE) 0.4-0.3 % SOLN Apply 1 drop to eye every 4 (four) hours as needed (dry eyes). And four times a day scheduled   potassium chloride (MICRO-K) 10 MEQ CR capsule Take 10 mEq by mouth  daily.   pravastatin (PRAVACHOL) 20 MG tablet Take 20 mg by mouth at bedtime.   sodium chloride (MURO 128) 5 % ophthalmic solution Place 1 drop into both eyes at bedtime.   tamsulosin (FLOMAX) 0.4 MG CAPS capsule Take 1 capsule (0.4 mg total) by mouth daily.   timolol (BETIMOL) 0.5 % ophthalmic solution Place 1 drop into both eyes daily.   traZODone (DESYREL) 50 MG tablet Take 1 tablet (50 mg total) by mouth at bedtime.   triamcinolone cream (KENALOG) 0.1 %  Apply 1 Application topically 2 (two) times daily.   No facility-administered encounter medications on file as of 09/30/2023.    Review of Systems Negative unless indicated in HPI.  There were no vitals filed for this visit. There is no height or weight on file to calculate BMI. BP Readings from Last 3 Encounters:  08/31/23 125/71  08/10/23 119/68  08/06/23 (!) 121/59   Wt Readings from Last 3 Encounters:  08/31/23 190 lb 12.8 oz (86.5 kg)  08/10/23 187 lb (84.8 kg)  08/06/23 187 lb (84.8 kg)   Physical Exam Constitutional:      Appearance: Normal appearance.  HENT:     Head: Normocephalic and atraumatic.  Cardiovascular:     Rate and Rhythm: Normal rate and regular rhythm.     Pulses: Normal pulses.     Heart sounds: Normal heart sounds.  Pulmonary:     Effort: No respiratory distress.     Breath sounds: No stridor. No wheezing or rales.  Abdominal:     General: Bowel sounds are normal. There is no distension.     Palpations: Abdomen is soft.     Tenderness: There is no abdominal tenderness. There is no right CVA tenderness or guarding.  Musculoskeletal:        General: No swelling.  Neurological:     Mental Status: He is alert. Mental status is at baseline.     Labs reviewed: Basic Metabolic Panel: Recent Labs    07/15/23 0000 08/10/23 0000 08/17/23 0000  NA 139 131* 138  K 4.3 4.5 4.7  CL 103 100 104  CO2 27* 25* 28*  BUN 24* 19 17  CREATININE 1.1 1.2 1.2  CALCIUM 10.2 10.3 10.5   Liver Function  Tests: Recent Labs    05/25/23 0000 07/15/23 0000 08/10/23 0000  AST 19 20 19   ALT 13 10 11   ALKPHOS 84 82 75  ALBUMIN 3.8 3.7 3.8   No results for input(s): "LIPASE", "AMYLASE" in the last 8760 hours. No results for input(s): "AMMONIA" in the last 8760 hours. CBC: Recent Labs    05/25/23 0000 07/15/23 0000 08/10/23 0000  WBC 10.6 9.1 9.3  NEUTROABS 7,590.00 8,199.00 6,547.00  HGB 11.9* 11.1* 10.5*  HCT 36* 34* 32*  PLT 322 296 347   Cardiac Enzymes: No results for input(s): "CKTOTAL", "CKMB", "CKMBINDEX", "TROPONINI" in the last 8760 hours. BNP: Invalid input(s): "POCBNP" Lab Results  Component Value Date   HGBA1C  08/24/2009    5.7 (NOTE) The ADA recommends the following therapeutic goal for glycemic control related to Hgb A1c measurement: Goal of therapy: <6.5 Hgb A1c  Reference: American Diabetes Association: Clinical Practice Recommendations 2010, Diabetes Care, 2010, 33: (Suppl  1).   Lab Results  Component Value Date   TSH 1.42 02/09/2023   Lab Results  Component Value Date   VITAMINB12 278 07/02/2022   No results found for: "FOLATE" No results found for: "IRON", "TIBC", "FERRITIN"  Imaging and Procedures obtained prior to SNF admission: CT Head Wo Contrast Result Date: 07/01/2022 CLINICAL DATA:  Head and neck trauma. EXAM: CT HEAD WITHOUT CONTRAST CT CERVICAL SPINE WITHOUT CONTRAST TECHNIQUE: Multidetector CT imaging of the head and cervical spine was performed following the standard protocol without intravenous contrast. Multiplanar CT image reconstructions of the cervical spine were also generated. RADIATION DOSE REDUCTION: This exam was performed according to the departmental dose-optimization program which includes automated exposure control, adjustment of the mA and/or kV according to patient size and/or use of iterative reconstruction  technique. COMPARISON:  CT examination dated February 16, 2018 FINDINGS: CT HEAD FINDINGS Brain: No evidence of acute  infarction, hemorrhage, extra-axial collection or mass lesion/mass effect. Prominence of the ventricles and sulci secondary to moderate cerebral volume loss. Diffuse low-attenuation of the periventricular white matter presumed advanced chronic microvascular ischemic changes of the white matter. Prominence of the ventricles disproportionate to the volume loss concerning for normal pressure hydrocephalus. Vascular: No hyperdense vessel or unexpected calcification. Skull: Normal. Negative for fracture or focal lesion. Sinuses/Orbits: No acute finding. Other: None. CT CERVICAL SPINE FINDINGS Alignment: Straightening of the cervical spine. Skull base and vertebrae: No acute fracture. No primary bone lesion or focal pathologic process. Soft tissues and spinal canal: No prevertebral fluid or swelling. No visible canal hematoma. Disc levels: Multilevel degenerate disc disease with disc height loss and marginal osteophytes with associated uncovertebral joint and facet joint arthropathy. C2-C3:  No significant findings. C3-C4: Disc height loss and uncovertebral joint arthropathy with mild right and moderate left neural foraminal stenosis. Mild left facet joint arthropathy. C4-C5: Disc height loss and marked uncovertebral joint arthropathy with moderate-to-severe bilateral neural foraminal stenosis. C5-C6: Disc height loss and uncovertebral joint arthropathy with moderate right and severe left neural foraminal stenosis. C6-C7: Disc height loss and uncovertebral joint arthropathy with mild left neural foraminal stenosis. C7-T1: Disc height loss and facet joint arthropathy. No significant neural foraminal stenosis. Upper chest: Negative. Other: None IMPRESSION: CT HEAD: 1. No acute intracranial abnormality. 2. Moderate cerebral volume loss and advanced chronic microvascular ischemic changes of the white matter. 3. Prominence of the ventricles more than expected for the cerebral volume loss concerning for normal pressure  hydrocephalus. CT CERVICAL SPINE: 1. No acute fracture or traumatic subluxation. 2. Multilevel degenerate disc disease with associated uncovertebral joint and facet joint arthropathy. Electronically Signed   By: Larose Hires D.O.   On: 07/01/2022 23:25   CT Cervical Spine Wo Contrast Result Date: 07/01/2022 CLINICAL DATA:  Head and neck trauma. EXAM: CT HEAD WITHOUT CONTRAST CT CERVICAL SPINE WITHOUT CONTRAST TECHNIQUE: Multidetector CT imaging of the head and cervical spine was performed following the standard protocol without intravenous contrast. Multiplanar CT image reconstructions of the cervical spine were also generated. RADIATION DOSE REDUCTION: This exam was performed according to the departmental dose-optimization program which includes automated exposure control, adjustment of the mA and/or kV according to patient size and/or use of iterative reconstruction technique. COMPARISON:  CT examination dated February 16, 2018 FINDINGS: CT HEAD FINDINGS Brain: No evidence of acute infarction, hemorrhage, extra-axial collection or mass lesion/mass effect. Prominence of the ventricles and sulci secondary to moderate cerebral volume loss. Diffuse low-attenuation of the periventricular white matter presumed advanced chronic microvascular ischemic changes of the white matter. Prominence of the ventricles disproportionate to the volume loss concerning for normal pressure hydrocephalus. Vascular: No hyperdense vessel or unexpected calcification. Skull: Normal. Negative for fracture or focal lesion. Sinuses/Orbits: No acute finding. Other: None. CT CERVICAL SPINE FINDINGS Alignment: Straightening of the cervical spine. Skull base and vertebrae: No acute fracture. No primary bone lesion or focal pathologic process. Soft tissues and spinal canal: No prevertebral fluid or swelling. No visible canal hematoma. Disc levels: Multilevel degenerate disc disease with disc height loss and marginal osteophytes with associated  uncovertebral joint and facet joint arthropathy. C2-C3:  No significant findings. C3-C4: Disc height loss and uncovertebral joint arthropathy with mild right and moderate left neural foraminal stenosis. Mild left facet joint arthropathy. C4-C5: Disc height loss and marked uncovertebral joint arthropathy with moderate-to-severe  bilateral neural foraminal stenosis. C5-C6: Disc height loss and uncovertebral joint arthropathy with moderate right and severe left neural foraminal stenosis. C6-C7: Disc height loss and uncovertebral joint arthropathy with mild left neural foraminal stenosis. C7-T1: Disc height loss and facet joint arthropathy. No significant neural foraminal stenosis. Upper chest: Negative. Other: None IMPRESSION: CT HEAD: 1. No acute intracranial abnormality. 2. Moderate cerebral volume loss and advanced chronic microvascular ischemic changes of the white matter. 3. Prominence of the ventricles more than expected for the cerebral volume loss concerning for normal pressure hydrocephalus. CT CERVICAL SPINE: 1. No acute fracture or traumatic subluxation. 2. Multilevel degenerate disc disease with associated uncovertebral joint and facet joint arthropathy. Electronically Signed   By: Larose Hires D.O.   On: 07/01/2022 23:25   DG Chest 1 View Result Date: 07/01/2022 CLINICAL DATA:  Altered mental status. EXAM: CHEST  1 VIEW COMPARISON:  Chest x-ray 09/06/2013 FINDINGS: The heart and mediastinal contours are unchanged. Aortic calcification. No focal consolidation. No pulmonary edema. No pleural effusion. No pneumothorax. No acute osseous abnormality. Severe degenerative changes of the bilateral shoulders. IMPRESSION: No active disease. Electronically Signed   By: Tish Frederickson M.D.   On: 07/01/2022 22:44    Assessment and Plan Assessment & Plan  Major Neurocognitive disorder No agitation  Cont with assistance with ADLS  Increase cognitively engaging activities and physical activities Cont with  aricept   Afib  No signs of bleeding Cont with eliquis Rate controlled  Bipolar disorder Follows with psychiatry  On lithium   OA C/o chronic pain in RT shoulder Cont with norco prn    HLD  Cont with pravastatin  Insomnia Cont with trazodone  BPH  Cont with flomax  Constipation Cont with colace   Urinary incontinence Pt c/o urinary leakage  Will start myrbetriq   30 min Total time spent for obtaining history,  performing a medically appropriate examination and evaluation, reviewing the tests,   documenting clinical information in the electronic or other health record,care coordination (not separately reported)

## 2023-10-04 ENCOUNTER — Encounter: Payer: Self-pay | Admitting: Sports Medicine

## 2023-10-05 DIAGNOSIS — L853 Xerosis cutis: Secondary | ICD-10-CM | POA: Diagnosis not present

## 2023-10-05 DIAGNOSIS — L570 Actinic keratosis: Secondary | ICD-10-CM | POA: Diagnosis not present

## 2023-10-05 DIAGNOSIS — L814 Other melanin hyperpigmentation: Secondary | ICD-10-CM | POA: Diagnosis not present

## 2023-10-05 DIAGNOSIS — L821 Other seborrheic keratosis: Secondary | ICD-10-CM | POA: Diagnosis not present

## 2023-10-05 DIAGNOSIS — I872 Venous insufficiency (chronic) (peripheral): Secondary | ICD-10-CM | POA: Diagnosis not present

## 2023-10-11 ENCOUNTER — Ambulatory Visit: Attending: Cardiology | Admitting: Cardiology

## 2023-10-11 ENCOUNTER — Encounter: Payer: Self-pay | Admitting: Cardiology

## 2023-10-11 VITALS — BP 98/54 | HR 70 | Ht 66.0 in | Wt 185.0 lb

## 2023-10-11 DIAGNOSIS — D6869 Other thrombophilia: Secondary | ICD-10-CM | POA: Diagnosis not present

## 2023-10-11 DIAGNOSIS — I4821 Permanent atrial fibrillation: Secondary | ICD-10-CM

## 2023-10-11 DIAGNOSIS — M6281 Muscle weakness (generalized): Secondary | ICD-10-CM | POA: Diagnosis not present

## 2023-10-11 DIAGNOSIS — M62521 Muscle wasting and atrophy, not elsewhere classified, right upper arm: Secondary | ICD-10-CM | POA: Diagnosis not present

## 2023-10-11 DIAGNOSIS — R29898 Other symptoms and signs involving the musculoskeletal system: Secondary | ICD-10-CM | POA: Diagnosis not present

## 2023-10-11 NOTE — Progress Notes (Signed)
 Electrophysiology Office Note:   Date:  10/11/2023  ID:  Sean Bishop, DOB Apr 10, 1933, MRN [id number]  Primary Cardiologist: Sean Burger, MD Primary Heart Failure: None Electrophysiologist: None      History of Present Illness:   Sean Bishop is a 89 y.o. male with h/o atrial fibrillation, bipolar disorder, hypertension, hyperlipidemia seen today for  for Electrophysiology evaluation of fibrillation at the request of Sean Bishop.  Today, denies symptoms of palpitations, chest pain, shortness of breath, orthopnea, PND, lower extremity edema, claudication, dizziness, presyncope, syncope, bleeding, or neurologic sequela. The patient is tolerating medications without difficulties.  He feels well.  He has no acute complaints.  He is in sinus rhythm today.  He wore a cardiac monitor due to intermittent bradycardia.  Monitor showed a 40% atrial fibrillation burden and pauses of up to 4 seconds.  He had his metoprolol stopped.  He has not had any symptoms.  He has not been fatigued, short of breath.  He is able to do all of his daily activities.  He is in a wheelchair, and at times uses a walker.  Review of systems complete and found to be negative unless listed in HPI.   EP Information / Studies Reviewed:    EKG is ordered today. Personal review as below.  EKG Interpretation Date/Time:  Monday October 11 2023 09:57:02 EDT Ventricular Rate:  70 PR Interval:  248 QRS Duration:  92 QT Interval:  402 QTC Calculation: 434 R Axis:   14  Text Interpretation: Sinus rhythm with 1st degree A-V block with Premature atrial complexes When compared with ECG of 27-Jul-2023 10:54, Premature atrial complexes are now Present Confirmed by Sean Bishop ([id number]) on 10/11/2023 10:14:04 AM     Risk Assessment/Calculations:    CHA2DS2-VASc Score =     This indicates a  % annual risk of stroke. The patient's score is based upon:             Physical Exam:   VS:  BP (!) 98/54 (BP Location: Left Arm,  Patient Position: Sitting, Cuff Size: Large)   Pulse 70   Ht 5\' 6"  (1.676 m)   Wt 185 lb (83.9 kg)   SpO2 98%   BMI 29.86 kg/m    Wt Readings from Last 3 Encounters:  10/11/23 185 lb (83.9 kg)  09/30/23 185 lb 9.6 oz (84.2 kg)  08/31/23 190 lb 12.8 oz (86.5 kg)     GEN: Well nourished, well developed in no acute distress NECK: No JVD; No carotid bruits CARDIAC: Regular rate and rhythm, no murmurs, rubs, gallops RESPIRATORY:  Clear to auscultation without rales, wheezing or rhonchi  ABDOMEN: Soft, non-tender, non-distended EXTREMITIES:  No edema; No deformity   ASSESSMENT AND PLAN:    1.  Persistent atrial fibrillation: Cardiac monitor showed a 40% atrial fibrillation burden.  Did have some pauses.  His beta-blocker was stopped.  He did have some tachycardic episodes while wearing his monitor.  Longest pause was 4 seconds.  He is in normal rhythm today.  He has no symptoms.  His metoprolol was stopped and he has not had any syncope or near syncope.  For now, we Sean Bishop monitor.  If he does wish for a rhythm control strategy, would start amiodarone.  Sean Bishop discuss with primary cardiology  2.  Chronic systolic heart failure: Ejection fraction is normalized on most recent echo.  Plan per primary cardiology.  3.  Secondary hypercoagulable state: Currently on Eliquis for atrial fibrillation  Follow up with Dr.  Pierrette Bishop  PRN   Signed, Sean Laura Cortland Ding, MD

## 2023-10-11 NOTE — Patient Instructions (Signed)
 Medication Instructions:  Your physician recommends that you continue on your current medications as directed. Please refer to the Current Medication list given to you today.  *If you need a refill on your cardiac medications before your next appointment, please call your pharmacy*  Lab Work: None ordered.  If you have labs (blood work) drawn today and your tests are completely normal, you will receive your results only by: MyChart Message (if you have MyChart) OR A paper copy in the mail If you have any lab test that is abnormal or we need to change your treatment, we will call you to review the results.  Testing/Procedures: None ordered.   Follow-Up: At [facility], you and your health needs are our priority.  As part of our continuing mission to provide you with exceptional heart care, our providers are all part of one team.  This team includes your primary Cardiologist (physician) and Advanced Practice Providers or APPs (Physician Assistants and Nurse Practitioners) who all work together to provide you with the care you need, when you need it.  Your next appointment:   Follow up with Dr Lawana Pray as needed      1st Floor: - Lobby - Registration  - Pharmacy  - Lab - Cafe  2nd Floor: - PV Lab - Diagnostic Testing (echo, CT, nuclear med)  3rd Floor: - Vacant  4th Floor: - TCTS (cardiothoracic surgery) - AFib Clinic - Structural Heart Clinic - Vascular Surgery  - Vascular Ultrasound  5th Floor: - HeartCare Cardiology (general and EP) - Clinical Pharmacy for coumadin, hypertension, lipid, weight-loss medications, and med management appointments    Valet parking services will be available as well.

## 2023-10-13 DIAGNOSIS — M6281 Muscle weakness (generalized): Secondary | ICD-10-CM | POA: Diagnosis not present

## 2023-10-13 DIAGNOSIS — R29898 Other symptoms and signs involving the musculoskeletal system: Secondary | ICD-10-CM | POA: Diagnosis not present

## 2023-10-13 DIAGNOSIS — M62521 Muscle wasting and atrophy, not elsewhere classified, right upper arm: Secondary | ICD-10-CM | POA: Diagnosis not present

## 2023-10-14 DIAGNOSIS — M62521 Muscle wasting and atrophy, not elsewhere classified, right upper arm: Secondary | ICD-10-CM | POA: Diagnosis not present

## 2023-10-14 DIAGNOSIS — M6281 Muscle weakness (generalized): Secondary | ICD-10-CM | POA: Diagnosis not present

## 2023-10-14 DIAGNOSIS — R29898 Other symptoms and signs involving the musculoskeletal system: Secondary | ICD-10-CM | POA: Diagnosis not present

## 2023-10-18 DIAGNOSIS — M6281 Muscle weakness (generalized): Secondary | ICD-10-CM | POA: Diagnosis not present

## 2023-10-18 DIAGNOSIS — R29898 Other symptoms and signs involving the musculoskeletal system: Secondary | ICD-10-CM | POA: Diagnosis not present

## 2023-10-18 DIAGNOSIS — M62521 Muscle wasting and atrophy, not elsewhere classified, right upper arm: Secondary | ICD-10-CM | POA: Diagnosis not present

## 2023-10-20 DIAGNOSIS — R29898 Other symptoms and signs involving the musculoskeletal system: Secondary | ICD-10-CM | POA: Diagnosis not present

## 2023-10-20 DIAGNOSIS — M6281 Muscle weakness (generalized): Secondary | ICD-10-CM | POA: Diagnosis not present

## 2023-10-22 DIAGNOSIS — R29898 Other symptoms and signs involving the musculoskeletal system: Secondary | ICD-10-CM | POA: Diagnosis not present

## 2023-10-22 DIAGNOSIS — M6281 Muscle weakness (generalized): Secondary | ICD-10-CM | POA: Diagnosis not present

## 2023-10-22 DIAGNOSIS — M62521 Muscle wasting and atrophy, not elsewhere classified, right upper arm: Secondary | ICD-10-CM | POA: Diagnosis not present

## 2023-10-26 ENCOUNTER — Encounter: Payer: Self-pay | Admitting: Nurse Practitioner

## 2023-10-26 ENCOUNTER — Non-Acute Institutional Stay (SKILLED_NURSING_FACILITY): Payer: Self-pay | Admitting: Nurse Practitioner

## 2023-10-26 DIAGNOSIS — R251 Tremor, unspecified: Secondary | ICD-10-CM | POA: Diagnosis not present

## 2023-10-26 DIAGNOSIS — R001 Bradycardia, unspecified: Secondary | ICD-10-CM

## 2023-10-26 DIAGNOSIS — M6281 Muscle weakness (generalized): Secondary | ICD-10-CM | POA: Diagnosis not present

## 2023-10-26 DIAGNOSIS — M150 Primary generalized (osteo)arthritis: Secondary | ICD-10-CM | POA: Diagnosis not present

## 2023-10-26 DIAGNOSIS — R29898 Other symptoms and signs involving the musculoskeletal system: Secondary | ICD-10-CM | POA: Diagnosis not present

## 2023-10-26 DIAGNOSIS — Z8673 Personal history of transient ischemic attack (TIA), and cerebral infarction without residual deficits: Secondary | ICD-10-CM

## 2023-10-26 DIAGNOSIS — M62521 Muscle wasting and atrophy, not elsewhere classified, right upper arm: Secondary | ICD-10-CM | POA: Diagnosis not present

## 2023-10-26 DIAGNOSIS — I4819 Other persistent atrial fibrillation: Secondary | ICD-10-CM

## 2023-10-26 DIAGNOSIS — F319 Bipolar disorder, unspecified: Secondary | ICD-10-CM

## 2023-10-26 DIAGNOSIS — R350 Frequency of micturition: Secondary | ICD-10-CM

## 2023-10-26 DIAGNOSIS — R4189 Other symptoms and signs involving cognitive functions and awareness: Secondary | ICD-10-CM | POA: Diagnosis not present

## 2023-10-26 DIAGNOSIS — I5042 Chronic combined systolic (congestive) and diastolic (congestive) heart failure: Secondary | ICD-10-CM

## 2023-10-26 DIAGNOSIS — N1831 Chronic kidney disease, stage 3a: Secondary | ICD-10-CM

## 2023-10-26 NOTE — Assessment & Plan Note (Signed)
 shoulders pain, worsens at night, limited abduction ROM over shoulder level, hx of neck clicks with ROM, aches, c/o right sciatica pain, worsens in am, chronic left knee pain,  Norco and Gabapentin for pain control.

## 2023-10-26 NOTE — Assessment & Plan Note (Signed)
 taking Tamsulosin .

## 2023-10-26 NOTE — Assessment & Plan Note (Signed)
 taking  Eliquis , heart rate is in control

## 2023-10-26 NOTE — Assessment & Plan Note (Signed)
 Bun/creat 17/1.15 08/17/23

## 2023-10-26 NOTE — Assessment & Plan Note (Signed)
 followed by psychiatry, on Lithium, TSH 1.42 02/09/23, 07/16/22 Lithium level 0.6(0.6-1.2)             Insomnia, takes Trazodone since 02/17/23 by psych

## 2023-10-26 NOTE — Assessment & Plan Note (Signed)
 off Metoprolol , Diltiazem  2/2 to bradycardia. 08/24/23 38/60 sec with V tach, stopped Metoprolol  per cardiology

## 2023-10-26 NOTE — Assessment & Plan Note (Signed)
stopped parkinson's medication because of affected his cognition, had resting tremor in left arm/leg in the past

## 2023-10-26 NOTE — Assessment & Plan Note (Signed)
 no focal weakness residual.

## 2023-10-26 NOTE — Assessment & Plan Note (Signed)
 EF 45-50% 11/2021, taking Furosemide, trace edema BLE, 05/10/23 CXR-mild pulmonary venous congestion, no infiltrate

## 2023-10-26 NOTE — Assessment & Plan Note (Signed)
 SNF [facility] for supportive care, 07/16/22 MMSE 18/30, taking Deonepezil sine 8/21/2 by Merilyn Baba

## 2023-10-26 NOTE — Progress Notes (Unsigned)
 Location:   SNF [facility] Room Number: 18 Place of Service:  SNF (31) Provider: Abner Hoffman Lillybeth Tal NP  Pcp, No  Patient Care Team: Pcp, No as PCP - General Manfred Seed, MD as PCP - Cardiology (Cardiology)  Extended Emergency Contact Information Primary Emergency Contact: Riester,[name] Address: [phone]           [facility] [address]          [place], [place] [address] [place] Home Phone: [phone] Mobile Phone: [phone] Relation: Spouse Secondary Emergency Contact: Fornwalt,Andy Address: [address].          [place], [place] [address] [place] Home Phone: [phone] Mobile Phone: [phone] Relation: Son  Code Status:  DNR Goals of care: Advanced Directive information    08/31/2023    4:10 PM  Advanced Directives  Does Patient Have a Medical Advance Directive? Yes  Type of Advance Directive Out of facility DNR (pink MOST or yellow form)  Does patient want to make changes to medical advance directive? No - Patient declined  Pre-existing out of facility DNR order (yellow form or pink MOST form) Yellow form placed in chart (order not valid for inpatient use)     Chief Complaint  Patient presents with  . Medical Management of Chronic Issues    HPI:  Pt is a 89 y.o. male seen today for medical management of chronic diseases.    Urinary frequency, taking Tamsulosin .               HOH L>R, HPOA-wife declined hearing aids.              Parkinson's disease, stopped parkinson's medication because of affected his cognition, had resting tremor in left arm/leg in the past             OP 12/01/22 dc'd Alendronate tscore -2.9 2017, -0.9 01/19/19, refused DEXA 11/02/22             OA shoulders pain, worsens at night, limited abduction ROM over shoulder level, hx of neck clicks with ROM, aches, c/o right sciatica pain, worsens in am, chronic left knee pain,  Norco and Gabapentin for pain control.              Cognitive  impairment, SNF [facility] for supportive care, 07/16/22 MMSE 18/30, taking Deonepezil sine 8/21/2 by Psych             CVA, no focal weakness residual.              Afib, taking  Eliquis              Bradycardia, off Metoprolol , Diltiazem  2/2 to bradycardia. 08/24/23 38/60 sec with V tach, stopped Metoprolol  per cardiology             Bipolar disorder, followed by psychiatry, on Lithium , TSH 1.42 02/09/23, 07/16/22 Lithium  level 0.6(0.6-1.2)             Insomnia, takes Trazodone  since 02/17/23 by psych             CHF, EF 45-50% 11/2021, taking Furosemide, trace edema BLE, 05/10/23 CXR-mild pulmonary venous congestion, no infiltrate             Gait abnormality, uses w/c for mobility              Hyponatremia, Na 138 08/17/23             Hyperbilirubinemia, bilirubin 0.6 07/15/23  Polycythemia,  Hgb 11.1 07/15/23             CKD, Bun/creat 17/1.15 08/17/23             Hypogonadism, hx of  testosterone  inj             Hyperlipidemia, takes Pravastatin              Glaucoma, eye drops.         Past Medical History:  Diagnosis Date  . AF (paroxysmal atrial fibrillation) (HCC) 02/16/2018  . Atrial fibrillation (HCC)   . Atrial fibrillation with RVR (HCC) 02/16/2018  . Bipolar 1 disorder (HCC)   . Bipolar disorder (HCC) 02/17/2018  . Dizziness 02/26/2020  . DJD (degenerative joint disease)   . Dyslipidemia 02/26/2020  . Gait instability 02/16/2018  . Hypertension   . Mitral regurgitation    mild   . Polycythemia 02/16/2018  . Renal insufficiency   . Stroke (HCC)   . Tremor 02/16/2018  . Tremors of nervous system 01/2018   Past Surgical History:  Procedure Laterality Date  . SHOULDER SURGERY      Allergies  Allergen Reactions  . Lipitor [Atorvastatin] Other (See Comments)    Joint soreness    Allergies as of 10/26/2023       Reactions   Lipitor [atorvastatin] Other (See Comments)   Joint soreness        Medication List        Accurate as of October 26, 2023  4:30 PM. If you have  any questions, ask your nurse or doctor.          ascorbic acid  500 MG tablet Commonly known as: VITAMIN C  Take 500 mg by mouth daily.   Calcium 600+D Plus Minerals 600-400 MG-UNIT Chew Chew 1 Piece by mouth in the morning and at bedtime.   docusate sodium  100 MG capsule Commonly known as: Colace Take 1 capsule (100 mg total) by mouth 2 (two) times daily.   donepezil  5 MG tablet Commonly known as: ARICEPT  Take 5 mg by mouth at bedtime.   Eliquis  5 MG Tabs tablet Generic drug: apixaban  TAKE 1 TABLET BY MOUTH TWICE DAILY.   furosemide 20 MG tablet Commonly known as: LASIX Take 20 mg by mouth daily.   gabapentin 100 MG capsule Commonly known as: NEURONTIN Take 200 mg by mouth at bedtime.   HYDROcodone -acetaminophen  5-325 MG tablet Commonly known as: NORCO/VICODIN Take 0.5 tablets by mouth in the morning and at bedtime.   latanoprost  0.005 % ophthalmic solution Commonly known as: XALATAN  Place 1 drop into both eyes at bedtime.   lithium  carbonate 300 MG ER tablet Commonly known as: LITHOBID  Take 1 tablet (300 mg total) by mouth every evening.   multivitamin with minerals Tabs tablet Take 1 tablet by mouth daily.   Myrbetriq 25 MG Tb24 tablet Generic drug: mirabegron ER Take 25 mg by mouth daily.   potassium chloride 10 MEQ CR capsule Commonly known as: MICRO-K Take 10 mEq by mouth daily.   pravastatin  20 MG tablet Commonly known as: PRAVACHOL  Take 20 mg by mouth at bedtime.   sodium chloride  5 % ophthalmic solution Commonly known as: MURO 128 Place 1 drop into both eyes at bedtime.   Systane 0.4-0.3 % Soln Generic drug: Polyethyl Glycol-Propyl Glycol Apply 1 drop to eye every 4 (four) hours as needed (dry eyes). And four times a day scheduled   tamsulosin  0.4 MG Caps capsule Commonly known as: FLOMAX  Take 1 capsule (0.4 mg  total) by mouth daily.   timolol  0.5 % ophthalmic solution Commonly known as: BETIMOL  Place 1 drop into both eyes daily.    traZODone  50 MG tablet Commonly known as: DESYREL  Take 1 tablet (50 mg total) by mouth at bedtime.   triamcinolone  cream 0.1 % Commonly known as: KENALOG  Apply 1 Application topically 2 (two) times daily.        Review of Systems  Constitutional:  Negative for appetite change, fatigue and fever.  HENT:  Positive for hearing loss. Negative for congestion and trouble swallowing.   Eyes:  Negative for visual disturbance.  Respiratory:  Negative for cough.   Cardiovascular:  Positive for leg swelling.  Gastrointestinal:  Negative for abdominal pain and constipation.  Genitourinary:  Negative for dysuria and urgency.       Incontinent of urine.   Musculoskeletal:  Positive for arthralgias, back pain and gait problem.       Shoulders pain with above shoulder level abduction ROM, worsens at night. R sciatic pain, not new, worse in am when getting up, chronic left knee pain Worsened left neck/shoulder pain /tingling, numbness arm, no decreased muscle strength.   Skin:  Negative for color change.  Neurological:  Negative for speech difficulty, weakness and headaches.  Psychiatric/Behavioral:  Positive for confusion and sleep disturbance. The patient is not nervous/anxious.        Early am awake, chronic    Immunization History  Administered Date(s) Administered  . Hepatitis B, ADULT 11/26/1997, 12/27/1997, 05/08/1998  . Influenza, High Dose Seasonal PF 03/31/2023  . Influenza-Unspecified 04/15/2022  . Moderna Covid-19 Vaccine Bivalent Booster 22yrs & up 04/14/2023  . PPD Test 07/08/2022  . Pneumococcal Conjugate-13 04/26/2014  . Pneumococcal Polysaccharide-23 04/14/2005  . RSV,unspecified 08/19/2022  . Td 08/04/2022  . Tdap 05/18/2016  . Zoster Recombinant(Shingrix) 07/29/2022, 10/28/2022   Pertinent  Health Maintenance Due  Topic Date Due  . INFLUENZA VACCINE  01/28/2024      07/08/2022    9:22 AM 07/10/2022   11:13 AM 08/03/2022    4:30 PM 08/25/2022   11:49 AM 09/24/2022     1:55 PM  Fall Risk  Falls in the past year?  1 1 1  0  Was there an injury with Fall?  1 1 0 0  Fall Risk Category Calculator  3 3 1  0  Fall Risk Category (Retired)  High     (RETIRED) Patient Fall Risk Level High fall risk High fall risk     Patient at Risk for Falls Due to  History of fall(s);Impaired balance/gait;Impaired mobility History of fall(s);Impaired balance/gait;Impaired mobility No Fall Risks   Fall risk Follow up  Falls evaluation completed Falls evaluation completed Falls evaluation completed    Functional Status Survey:    Vitals:   10/26/23 1533  BP: (!) 129/58  Pulse: (!) 56  Resp: 18  Temp: (!) 97.2 F (36.2 C)  SpO2: 96%  Weight: 185 lb 9.6 oz (84.2 kg)   Body mass index is 29.96 kg/m. Physical Exam Vitals and nursing note reviewed.  Constitutional:      Appearance: Normal appearance.  HENT:     Head: Normocephalic and atraumatic.     Nose: Nose normal.     Mouth/Throat:     Mouth: Mucous membranes are moist.  Eyes:     Extraocular Movements: Extraocular movements intact.     Conjunctiva/sclera: Conjunctivae normal.     Pupils: Pupils are equal, round, and reactive to light.  Cardiovascular:     Rate  and Rhythm: Bradycardia present. Rhythm irregular.     Heart sounds: No murmur heard. Pulmonary:     Effort: Pulmonary effort is normal.     Breath sounds: No rales.  Abdominal:     General: Bowel sounds are normal.     Palpations: Abdomen is soft.     Tenderness: There is no abdominal tenderness.  Musculoskeletal:        General: Tenderness present. No swelling, deformity or signs of injury.     Cervical back: Normal range of motion and neck supple.     Right lower leg: Edema present.     Left lower leg: Edema present.     Comments: trace edema BLE.  Shoulders pain with above shoulder level abduction ROM, worsens at night, R sciatica pain worse in am/getting out of bed, chronic left knee pain.  Skin:    General: Skin is warm and dry.      Comments: The right 5th toe nail yellow thick from previous examination   Neurological:     General: No focal deficit present.     Mental Status: He is alert and oriented to person, place, and time. Mental status is at baseline.     Motor: No weakness.     Gait: Gait abnormal.  Psychiatric:        Mood and Affect: Mood normal.        Behavior: Behavior normal.        Thought Content: Thought content normal.    Labs reviewed: Recent Labs    07/15/23 0000 08/10/23 0000 08/17/23 0000  NA 139 131* 138  K 4.3 4.5 4.7  CL 103 100 104  CO2 27* 25* 28*  BUN 24* 19 17  CREATININE 1.1 1.2 1.2  CALCIUM 10.2 10.3 10.5   Recent Labs    05/25/23 0000 07/15/23 0000 08/10/23 0000  AST 19 20 19   ALT 13 10 11   ALKPHOS 84 82 75  ALBUMIN 3.8 3.7 3.8   Recent Labs    05/25/23 0000 07/15/23 0000 08/10/23 0000  WBC 10.6 9.1 9.3  NEUTROABS 7,590.00 8,199.00 6,547.00  HGB 11.9* 11.1* 10.5*  HCT 36* 34* 32*  PLT 322 296 347   Lab Results  Component Value Date   TSH 1.42 02/09/2023   Lab Results  Component Value Date   HGBA1C  08/24/2009    5.7 (NOTE) The ADA recommends the following therapeutic goal for glycemic control related to Hgb A1c measurement: Goal of therapy: <6.5 Hgb A1c  Reference: American Diabetes Association: Clinical Practice Recommendations 2010, Diabetes Care, 2010, 33: (Suppl  1).   Lab Results  Component Value Date   CHOL 116 12/03/2022   HDL 46 12/03/2022   LDLCALC 54 12/03/2022   LDLDIRECT 55 09/02/2020   TRIG 80 12/03/2022   CHOLHDL 3.3 09/02/2020    Significant Diagnostic Results in last 30 days:  No results found.  Assessment/Plan  Urinary frequency taking Tamsulosin .   Tremors of nervous system stopped parkinson's medication because of affected his cognition, had resting tremor in left arm/leg in the past  Osteoarthritis, multiple sites shoulders pain, worsens at night, limited abduction ROM over shoulder level, hx of neck clicks with  ROM, aches, c/o right sciatica pain, worsens in am, chronic left knee pain,  Norco and Gabapentin for pain control.   Cognitive impairment SNF [facility] for supportive care, 07/16/22 MMSE 18/30, taking Deonepezil sine 8/21/2 by Psych  History of stroke no focal weakness residual.   Atrial fibrillation, persistent (HCC)  taking  Eliquis , heart rate is in control  Bradycardia  off Metoprolol , Diltiazem  2/2 to bradycardia. 08/24/23 38/60 sec with V tach, stopped Metoprolol  per cardiology  Bipolar disorder [facility]) followed by psychiatry, on Lithium , TSH 1.42 02/09/23, 07/16/22 Lithium  level 0.6(0.6-1.2)             Insomnia, takes Trazodone  since 02/17/23 by psych  Chronic CHF (HCC) EF 45-50% 11/2021, taking Furosemide, trace edema BLE, 05/10/23 CXR-mild pulmonary venous congestion, no infiltrate  CKD (chronic kidney disease) stage 3, GFR 30-59 ml/min (HCC) Bun/creat 17/1.15 08/17/23   Family/ staff Communication: plan of care reviewed with the patient and charge nurse.   Labs/tests ordered:  none

## 2023-10-27 DIAGNOSIS — M62521 Muscle wasting and atrophy, not elsewhere classified, right upper arm: Secondary | ICD-10-CM | POA: Diagnosis not present

## 2023-10-27 DIAGNOSIS — R29898 Other symptoms and signs involving the musculoskeletal system: Secondary | ICD-10-CM | POA: Diagnosis not present

## 2023-10-27 DIAGNOSIS — M6281 Muscle weakness (generalized): Secondary | ICD-10-CM | POA: Diagnosis not present

## 2023-10-28 DIAGNOSIS — M6281 Muscle weakness (generalized): Secondary | ICD-10-CM | POA: Diagnosis not present

## 2023-10-28 DIAGNOSIS — M62521 Muscle wasting and atrophy, not elsewhere classified, right upper arm: Secondary | ICD-10-CM | POA: Diagnosis not present

## 2023-10-28 DIAGNOSIS — R29898 Other symptoms and signs involving the musculoskeletal system: Secondary | ICD-10-CM | POA: Diagnosis not present

## 2023-10-29 DIAGNOSIS — R29898 Other symptoms and signs involving the musculoskeletal system: Secondary | ICD-10-CM | POA: Diagnosis not present

## 2023-10-29 DIAGNOSIS — M6281 Muscle weakness (generalized): Secondary | ICD-10-CM | POA: Diagnosis not present

## 2023-10-29 DIAGNOSIS — M62521 Muscle wasting and atrophy, not elsewhere classified, right upper arm: Secondary | ICD-10-CM | POA: Diagnosis not present

## 2023-11-01 ENCOUNTER — Encounter: Payer: Self-pay | Admitting: Psychiatry

## 2023-11-01 ENCOUNTER — Ambulatory Visit (INDEPENDENT_AMBULATORY_CARE_PROVIDER_SITE_OTHER): Payer: Medicare Other | Admitting: Psychiatry

## 2023-11-01 DIAGNOSIS — Z79899 Other long term (current) drug therapy: Secondary | ICD-10-CM

## 2023-11-01 DIAGNOSIS — F5105 Insomnia due to other mental disorder: Secondary | ICD-10-CM | POA: Diagnosis not present

## 2023-11-01 DIAGNOSIS — F319 Bipolar disorder, unspecified: Secondary | ICD-10-CM | POA: Diagnosis not present

## 2023-11-01 DIAGNOSIS — M6281 Muscle weakness (generalized): Secondary | ICD-10-CM | POA: Diagnosis not present

## 2023-11-01 DIAGNOSIS — G309 Alzheimer's disease, unspecified: Secondary | ICD-10-CM

## 2023-11-01 DIAGNOSIS — R29898 Other symptoms and signs involving the musculoskeletal system: Secondary | ICD-10-CM | POA: Diagnosis not present

## 2023-11-01 DIAGNOSIS — M62521 Muscle wasting and atrophy, not elsewhere classified, right upper arm: Secondary | ICD-10-CM | POA: Diagnosis not present

## 2023-11-01 DIAGNOSIS — F0280 Dementia in other diseases classified elsewhere without behavioral disturbance: Secondary | ICD-10-CM

## 2023-11-01 NOTE — Progress Notes (Signed)
 [name] [id number] 09/11/1932 89 y.o.   Subjective:   Patient ID:  Sean Bishop is a 89 y.o. (DOB 12-02-1932) male.  Chief Complaint:  Chief Complaint  Patient presents with   Follow-up   Memory Loss    HPI Sean Bishop presents for follow-up of bipolar disorder.  seen August 2020.  He was having balance problems and lithium  was reduced from 600 mg daily to 450 mg daily.  He was encouraged to get a lithium  level as soon as possible.  08/02/2019 appointment with the following noted and without med changes. Overall doing very well.  Handling lockdown pretty well.  Couldn't go much of anywhere from the facility.  Missed socialization.  They are staying in [facility].  Not a bad facility.  No complaints with it.   No sig exercise.  Some walking in the building. Got both Covid vaccines.  Son Engineer, civil (consulting) in [place]. No sig tremor.  Some walking problems.  12/23/2020 appointment with the following noted:  seen with wife Health problems. On lithium  450 mg daily. Has had physical problems.  Using walker.   W thinks he's doing OK.  However she notes that he is memory has declined.  Sometimes he "remembers" things in a way that is different from the way they actually happened.  However he has not had any unusual mood swings. PCP says he cannot drive anymore bc neuropathy and physical problems. Plan: get lithium  level  06/26/21 appt noted: Had trouble getting the lithium  level.   Hip pain and needs walker.   No SE noted with lithium . Plan: no changes  12/24/21 appt noted: Resident [facility].  Sean Bishop doing well. Doing well.  No unusual problems. Lithium  450 mg HS Not driving. No med swings since reduction in lithium . Patient reports stable mood and denies depressed or irritable moods.  Patient denies any recent difficulty with anxiety.  Patient denies difficulty with sleep initiation or maintenance. Denies appetite disturbance.  Patient reports that energy and motivation  have been good.  Patient reports some difficulty with concentration and memory.  Patient denies any suicidal ideation.  Aware of cognitive problems.   06/25/22 appt noted: Resident [facility].  Sean Bishop doing well. Doing well.  No unusual problems. Lithium  450 mg HS Not driving. Requiring walker.   Admits forgetfulness.  No mood swings. Some health problems. No mood problems with mood or anxiety.  No psychosis.  No tremor..  02/17/23 appt noted:  seen with wife Sean Bishop. Losing memory.   Moved to [facility].   Meds reduced to lithium  CR 300 mg HS Complaining of trouble going to sleep.   Some times of dep but not extended.   Wife says he gets a bit obsessive on things he has to do.  He acknowledges.  Can ruminate over health concerns.  Upset that he can't be with wife.   He's having some problems dealing with some things from the past per wife.  Hard for her to deal with it.  Tolerating meds. Plan: Donepezil  5 mg daily for a month, then 10 mg daily  Insomnia is causing clinical problems, reasonable trial trazodone  50 mg HS.  05/03/23 appt noted:  seen with wife Talks about staying at [facility].  Had a major problem a the facility.  Got upset they didn't give him pain meds for shoulder and sleep meds.  They stopped without telling him anything about it.  But got them resumed.   Not  entirely happy with the location.   Psych meds: donepezil  10, lithium  300 HS, getting Norco 5 0.5 tablet BID, trazodone  50 HS Sleep has improved.  Was really bad but is better now. Goes to bed and dressed for bed at 8.  Is not tired or sleepy but doesn't want to watch TV.  They get him up at 7 .   [employer] and watches the games.  Knows the hockey schedule.    SE some hangover but OK after breakfast. W doesn't visit daily.   Exercises. Shoulder pain.  11/01/23 appt noted:  seen with wife, Sean Bishop.  Both at [facility] in different locations Med: donepezil  10,  lithium  300 HS, getting Norco 5 0.5 tablet BID, trazodone  50 HS No SE. Married 63 yrs.   No mood swings.  Not dep.  Sleep good.  Son Engineer, civil (consulting) in [place].  Wife Sean Bishop. Adopted kids Sean Bishop nurse doing well;  Sean Bishop was murdered or suicide.  PCP [name] advised against driving bc history of syncope.  Past Psychiatric Medication Trials:  Decades of Lithium ,  Lorazepam   Review of Systems:  Review of Systems  Constitutional:  Negative for fatigue.  Musculoskeletal:  Positive for arthralgias, back pain and gait problem.  Neurological:  Positive for weakness. Negative for tremors and syncope.  Psychiatric/Behavioral:  Positive for decreased concentration. Negative for agitation, behavioral problems, confusion, dysphoric mood, hallucinations, self-injury, sleep disturbance and suicidal ideas. The patient is not nervous/anxious and is not hyperactive.     Medications: I have reviewed the patient's current medications.  Current Outpatient Medications  Medication Sig Dispense Refill   ascorbic acid  (VITAMIN C ) 500 MG tablet Take 500 mg by mouth daily.     Calcium Carbonate-Vit D-Min (CALCIUM 600+D PLUS MINERALS) 600-400 MG-UNIT CHEW Chew 1 Piece by mouth in the morning and at bedtime.     docusate sodium  (COLACE) 100 MG capsule Take 1 capsule (100 mg total) by mouth 2 (two) times daily.     donepezil  (ARICEPT ) 5 MG tablet Take 5 mg by mouth at bedtime.     ELIQUIS  5 MG TABS tablet TAKE 1 TABLET BY MOUTH TWICE DAILY. 180 tablet 1   furosemide (LASIX) 20 MG tablet Take 20 mg by mouth daily.     gabapentin (NEURONTIN) 100 MG capsule Take 200 mg by mouth at bedtime.     HYDROcodone -acetaminophen  (NORCO/VICODIN) 5-325 MG tablet Take 0.5 tablets by mouth in the morning and at bedtime. 30 tablet 0   latanoprost  (XALATAN ) 0.005 % ophthalmic solution Place 1 drop into both eyes at bedtime.     lithium  carbonate (LITHOBID ) 300 MG ER tablet Take 1 tablet (300 mg total) by mouth every evening. 90  tablet 1   mirabegron ER (MYRBETRIQ) 25 MG TB24 tablet Take 25 mg by mouth daily.     Multiple Vitamin (MULTIVITAMIN WITH MINERALS) TABS tablet Take 1 tablet by mouth daily.     Polyethyl Glycol-Propyl Glycol (SYSTANE) 0.4-0.3 % SOLN Apply 1 drop to eye every 4 (four) hours as needed (dry eyes). And four times a day scheduled     potassium chloride (MICRO-K) 10 MEQ CR capsule Take 10 mEq by mouth daily.     pravastatin  (PRAVACHOL ) 20 MG tablet Take 20 mg by mouth at bedtime.     sodium chloride  (MURO 128) 5 % ophthalmic solution Place 1 drop into both eyes at bedtime.     tamsulosin  (FLOMAX ) 0.4 MG CAPS capsule Take 1 capsule (0.4 mg total) by mouth daily.  timolol  (BETIMOL ) 0.5 % ophthalmic solution Place 1 drop into both eyes daily.     traZODone  (DESYREL ) 50 MG tablet Take 1 tablet (50 mg total) by mouth at bedtime. 30 tablet 5   triamcinolone  cream (KENALOG ) 0.1 % Apply 1 Application topically 2 (two) times daily.     No current facility-administered medications for this visit.    Medication Side Effects: None  Allergies:  Allergies  Allergen Reactions   Lipitor [Atorvastatin] Other (See Comments)    Joint soreness    Past Medical History:  Diagnosis Date   AF (paroxysmal atrial fibrillation) (HCC) 02/16/2018   Atrial fibrillation (HCC)    Atrial fibrillation with RVR (HCC) 02/16/2018   Bipolar 1 disorder (HCC)    Bipolar disorder (HCC) 02/17/2018   Dizziness 02/26/2020   DJD (degenerative joint disease)    Dyslipidemia 02/26/2020   Gait instability 02/16/2018   Hypertension    Mitral regurgitation    mild    Polycythemia 02/16/2018   Renal insufficiency    Stroke [facility])    Tremor 02/16/2018   Tremors of nervous system 01/2018    Family History  Problem Relation Age of Onset   Alcohol abuse Mother    Cancer Father     Social History   Socioeconomic History   Marital status: Married    Spouse name: Not on file   Number of children: Not on file   Years of  education: Not on file   Highest education level: Not on file  Occupational History   Not on file  Tobacco Use   Smoking status: Former   Smokeless tobacco: Never  Vaping Use   Vaping status: Never Used  Substance and Sexual Activity   Alcohol use: Yes    Alcohol/week: 1.0 standard drink of alcohol    Types: 1 Glasses of wine per week    Comment: 1 per month   Drug use: No   Sexual activity: Not on file  Other Topics Concern   Not on file  Social History Narrative   Not on file   Social Drivers of Health   Financial Resource Strain: Not on file  Food Insecurity: No Food Insecurity (07/02/2022)   Hunger Vital Sign    Worried About Running Out of Food in the Last Year: Never true    Ran Out of Food in the Last Year: Never true  Transportation Needs: No Transportation Needs (07/02/2022)   PRAPARE - Administrator, Civil Service (Medical): No    Lack of Transportation (Non-Medical): No  Physical Activity: Not on file  Stress: Not on file  Social Connections: Not on file  Intimate Partner Violence: Not At Risk (07/02/2022)   Humiliation, Afraid, Rape, and Kick questionnaire    Fear of Current or Ex-Partner: No    Emotionally Abused: No    Physically Abused: No    Sexually Abused: No    Past Medical History, Surgical history, Social history, and Family history were reviewed and updated as appropriate.   Please see review of systems for further details on the patient's review from today.   Objective:   Physical Exam:  There were no vitals taken for this visit.  Physical Exam Constitutional:      General: He is not in acute distress.    Appearance: He is well-developed.  Neurological:     Mental Status: He is alert and oriented to person, place, and time. Mental status is at baseline.     Cranial Nerves: No  dysarthria.     Motor: Weakness present. No tremor.     Coordination: Coordination abnormal.     Gait: Gait abnormal.     Comments: WC bound   Psychiatric:        Attention and Perception: Attention normal.        Mood and Affect: Mood normal. Mood is not anxious, depressed or elated. Affect is not labile, blunt, tearful or inappropriate.        Speech: Speech normal. Speech is not slurred.        Behavior: Behavior normal. Behavior is cooperative.        Thought Content: Thought content normal. Thought content is not paranoid or delusional. Thought content does not include homicidal or suicidal ideation. Thought content does not include suicidal plan.        Cognition and Memory: Cognition is impaired. Memory is impaired. He exhibits impaired recent memory and impaired remote memory.     Comments: Talkative with fair focus  Mood good.  No mania. Somewhat difficult to direct but did ask questions. Word finding problems better than in the past        Lab Review:     Component Value Date/Time   NA 138 08/17/2023 0000   K 4.7 08/17/2023 0000   CL 104 08/17/2023 0000   CO2 28 (A) 08/17/2023 0000   GLUCOSE 88 07/06/2022 0406   BUN 17 08/17/2023 0000   CREATININE 1.2 08/17/2023 0000   CREATININE 1.11 07/06/2022 0406   CALCIUM 10.5 08/17/2023 0000   PROT 6.4 (L) 07/06/2022 0406   PROT 7.4 09/02/2020 1423   ALBUMIN 3.8 08/10/2023 0000   ALBUMIN 4.5 09/02/2020 1423   AST 19 08/10/2023 0000   ALT 11 08/10/2023 0000   ALKPHOS 75 08/10/2023 0000   BILITOT 1.5 (H) 07/06/2022 0406   BILITOT 1.4 (H) 09/02/2020 1423   GFRNONAA >60 07/06/2022 0406   GFRAA >60 02/17/2018 0129       Component Value Date/Time   WBC 9.3 08/10/2023 0000   WBC 8.9 07/04/2022 1101   RBC 3.51 (A) 08/10/2023 0000   HGB 10.5 (A) 08/10/2023 0000   HCT 32 (A) 08/10/2023 0000   PLT 347 08/10/2023 0000   MCV 91.9 07/04/2022 1101   MCH 31.2 07/04/2022 1101   MCHC 34.0 07/04/2022 1101   RDW 13.3 07/04/2022 1101   LYMPHSABS 0.5 (L) 07/01/2022 2346   MONOABS 1.0 07/01/2022 2346   EOSABS 0.0 07/01/2022 2346   BASOSABS 0.0 07/01/2022 2346     Lithium  Lvl  Date Value Ref Range Status  07/01/2022 0.47 (L) 0.60 - 1.20 mmol/L Final    Comment:    Performed at [facility], [address] [place], [place] [address]   07/01/22 lithium  level 0.47 on 300 mg daily.  No results found for: "PHENYTOIN", "PHENOBARB", "VALPROATE", "CBMZ"   Labs July 07, 2018 incl Li 0.9,  December 14, 2018 = Cr 1.29, Calcium 10.8, CMP normal  Normal lipids and PTH  12/22 Cr 1.34, Calcium 10.4  Cannot see any labs from 2023 in EPIC  .res Assessment: Plan:    Josten "Josiah Nigh" was seen today for follow-up and memory loss.  Diagnoses and all orders for this visit:  Bipolar I disorder (HCC) -     Lithium  level  Alzheimer's dementia without behavioral disturbance (HCC)  Insomnia due to mental condition  Lithium  use   30 min with wife:  Supportive therapy dealing with age related problems and health .  No  worsening of his health since his last visit. Except his shoulder pain is worse. .  Not regained full capacity.  Cannot drive anymore DT aging.  Cognition is better with donepezil   .  insomnia is better with trazodone .    now in NH and can get lithium  levels more.  07/01/22 lithium  level 0.47 on 300 mg daily. Need lithium  level ASAP  Bipolar has been stable for many years.  No mood swings nor depression.  Not severely distressed now.  Counseled patient regarding potential benefits, risks, and side effects of lithium  to include potential risk of lithium  affecting thyroid  and renal function.  Discussed need for periodic lab monitoring to determine drug level and to assess for potential adverse effects.  Counseled patient regarding signs and symptoms of lithium  toxicity and advised that they notify office immediately or seek urgent medical attention if experiencing these signs and symptoms.  Patient advised to contact office with any questions or concerns. 07/2022 Cr is better in normal range.  Discussed the importance of avoiding lithium   toxicity and how with aging the body has more difficulty eliminating lithium .  Therefore lithium  dosages may need to be decreased as one ages.     continue lithium  to 300 mg daily not manic.  Memory impairment progressing some.   Knows the doctor and can give recent history.    Donepezil  10 mg daily   Insomnia managed trazodone  50 mg HS. Disc SE.  WC bound and therefore not much fall risk.  This appt was 30 mins.  FU 6 mos  Nori Beat, MD, DFAPA   Consider reducing lithium  Please see After Visit Summary for patient specific instructions.  Future Appointments  Date Time Provider Department Center  05/03/2024 10:30 AM Cottle, Kennedy Peabody., MD [facility] None       Orders Placed This Encounter  Procedures   Lithium  level       -------------------------------

## 2023-11-02 ENCOUNTER — Encounter: Payer: Self-pay | Admitting: Psychiatry

## 2023-11-02 DIAGNOSIS — F319 Bipolar disorder, unspecified: Secondary | ICD-10-CM | POA: Diagnosis not present

## 2023-11-03 ENCOUNTER — Non-Acute Institutional Stay (SKILLED_NURSING_FACILITY): Payer: Self-pay | Admitting: Adult Health

## 2023-11-03 ENCOUNTER — Encounter: Payer: Self-pay | Admitting: Adult Health

## 2023-11-03 DIAGNOSIS — R4189 Other symptoms and signs involving cognitive functions and awareness: Secondary | ICD-10-CM | POA: Diagnosis not present

## 2023-11-03 DIAGNOSIS — M6281 Muscle weakness (generalized): Secondary | ICD-10-CM | POA: Diagnosis not present

## 2023-11-03 DIAGNOSIS — I4821 Permanent atrial fibrillation: Secondary | ICD-10-CM | POA: Diagnosis not present

## 2023-11-03 DIAGNOSIS — M19011 Primary osteoarthritis, right shoulder: Secondary | ICD-10-CM | POA: Diagnosis not present

## 2023-11-03 DIAGNOSIS — R29898 Other symptoms and signs involving the musculoskeletal system: Secondary | ICD-10-CM | POA: Diagnosis not present

## 2023-11-03 DIAGNOSIS — M62521 Muscle wasting and atrophy, not elsewhere classified, right upper arm: Secondary | ICD-10-CM | POA: Diagnosis not present

## 2023-11-03 NOTE — Telephone Encounter (Signed)
 This is a skilled resident and all communication should be handled via the facility staff. [facility] is out of office, will send to [facility]

## 2023-11-03 NOTE — Telephone Encounter (Signed)
-     he is currently taking Donepezil  5 mg at bedtime. He has atrial fibrillation and a higher dosage of Donepezil  might negatively affect his heart condition. It is safer for him to be on a lower dosage.

## 2023-11-03 NOTE — Telephone Encounter (Signed)
 Patient is scheduled for me to see today. He went out to ortho and waiting on him. FYI.

## 2023-11-03 NOTE — Telephone Encounter (Signed)
 [facility] please respond directly to the patient or communicate with the facility staff about this patient. The [facility] Clinical Pool MA's would not address concerns on a SNF resident.

## 2023-11-03 NOTE — Progress Notes (Signed)
 Location:  [facility] Room Number: 18 A Place of Service:  SNF [phone]) Provider:  Duncan Gibson, NP   Pcp, No  Patient Care Team: Pcp, No as PCP - General Manfred Seed, MD as PCP - Cardiology (Cardiology)  Extended Emergency Contact Information Primary Emergency Contact: Soldo,[name] Address: [phone]           [facility] [address]          [place], [place] [address] [place] Home Phone: [phone] Mobile Phone: [phone] Relation: Spouse Secondary Emergency Contact: Savitz,Andy Address: [address]          [place], [place] [address] [place] Home Phone: [phone] Mobile Phone: [phone] Relation: Son  Code Status:  DNR Goals of care: Advanced Directive information    08/31/2023    4:10 PM  Advanced Directives  Does Patient Have a Medical Advance Directive? Yes  Type of Advance Directive Out of facility DNR (pink MOST or yellow form)  Does patient want to make changes to medical advance directive? No - Patient declined  Pre-existing out of facility DNR order (yellow form or pink MOST form) Yellow form placed in chart (order not valid for inpatient use)     Chief Complaint  Patient presents with   Medical Management of Chronic Issues    HPI:  Pt is a 89 y.o. male seen today for an acute visit for  regarding medication management. He is a resident of [facility]. He is currently taking Donepezil  for cognitive impairment. Latest BIMS score 8/15, ranging as moderate cognitive impairment. Wife thought that after a month Donepezil  dosage will be increased  He takes Eliquis  5 mg BID for atrial fibrillation. HR ranging from 58 to 78.   He was seen in his room today while sitting on his wheelchair. Discussed that higher dosage of Donepezil  may potentially increase possibility of bradycardia.   Past Medical History:  Diagnosis Date   AF (paroxysmal atrial  fibrillation) (HCC) 02/16/2018   Atrial fibrillation (HCC)    Atrial fibrillation with RVR (HCC) 02/16/2018   Bipolar 1 disorder (HCC)    Bipolar disorder (HCC) 02/17/2018   Dizziness 02/26/2020   DJD (degenerative joint disease)    Dyslipidemia 02/26/2020   Gait instability 02/16/2018   Hypertension    Mitral regurgitation    mild    Polycythemia 02/16/2018   Renal insufficiency    Stroke [facility])    Tremor 02/16/2018   Tremors of nervous system 01/2018   Past Surgical History:  Procedure Laterality Date   SHOULDER SURGERY      Allergies  Allergen Reactions   Lipitor [Atorvastatin] Other (See Comments)    Joint soreness    Outpatient Encounter Medications as of 11/03/2023  Medication Sig   ascorbic acid  (VITAMIN C ) 500 MG tablet Take 500 mg by mouth daily.   Calcium Carbonate-Vit D-Min (CALCIUM 600+D PLUS MINERALS) 600-400 MG-UNIT CHEW Chew 1 Piece by mouth in the morning and at bedtime.   docusate sodium  (COLACE) 100 MG capsule Take 1 capsule (100 mg total) by mouth 2 (two) times daily.   donepezil  (ARICEPT ) 5 MG tablet Take 5 mg by mouth at bedtime.   ELIQUIS  5 MG TABS tablet TAKE 1 TABLET BY MOUTH TWICE DAILY.   furosemide (LASIX) 20 MG tablet Take 20 mg by mouth daily.   gabapentin (NEURONTIN) 100 MG capsule Take 200 mg by mouth at bedtime.   HYDROcodone -acetaminophen  (NORCO/VICODIN)  5-325 MG tablet Take 0.5 tablets by mouth in the morning and at bedtime.   latanoprost  (XALATAN ) 0.005 % ophthalmic solution Place 1 drop into both eyes at bedtime.   lithium  carbonate (LITHOBID ) 300 MG ER tablet Take 1 tablet (300 mg total) by mouth every evening.   mirabegron ER (MYRBETRIQ) 25 MG TB24 tablet Take 25 mg by mouth daily.   Multiple Vitamin (MULTIVITAMIN WITH MINERALS) TABS tablet Take 1 tablet by mouth daily.   Polyethyl Glycol-Propyl Glycol (SYSTANE) 0.4-0.3 % SOLN Apply 1 drop to eye every 4 (four) hours as needed (dry eyes). And four times a day scheduled   potassium chloride  (MICRO-K) 10 MEQ CR capsule Take 10 mEq by mouth daily.   pravastatin  (PRAVACHOL ) 20 MG tablet Take 20 mg by mouth at bedtime.   sodium chloride  (MURO 128) 5 % ophthalmic solution Place 1 drop into both eyes at bedtime.   tamsulosin  (FLOMAX ) 0.4 MG CAPS capsule Take 1 capsule (0.4 mg total) by mouth daily.   timolol  (BETIMOL ) 0.5 % ophthalmic solution Place 1 drop into both eyes daily.   traZODone  (DESYREL ) 50 MG tablet Take 1 tablet (50 mg total) by mouth at bedtime.   triamcinolone  cream (KENALOG ) 0.1 % Apply 1 Application topically 2 (two) times daily. (Patient not taking: Reported on 11/03/2023)   No facility-administered encounter medications on file as of 11/03/2023.    Review of Systems  Constitutional:  Negative for activity change, appetite change and fever.  HENT:  Negative for sore throat.   Eyes: Negative.   Cardiovascular:  Negative for chest pain and leg swelling.  Gastrointestinal:  Negative for abdominal distention, diarrhea and vomiting.  Genitourinary:  Negative for dysuria, frequency and urgency.  Skin:  Negative for color change.  Neurological:  Negative for dizziness and headaches.  Psychiatric/Behavioral:  Negative for behavioral problems and sleep disturbance. The patient is not nervous/anxious.     Immunization History  Administered Date(s) Administered   Hepatitis B, ADULT 11/26/1997, 12/27/1997, 05/08/1998   Influenza, High Dose Seasonal PF 03/31/2023   Influenza-Unspecified 04/15/2022   Moderna Covid-19 Vaccine Bivalent Booster 2yrs & up 04/14/2023   PPD Test 07/08/2022   Pneumococcal Conjugate-13 04/26/2014   Pneumococcal Polysaccharide-23 04/14/2005   RSV,unspecified 08/19/2022   Td 08/04/2022   Tdap 05/18/2016   Zoster Recombinant(Shingrix) 07/29/2022, 10/28/2022   Pertinent  Health Maintenance Due  Topic Date Due   INFLUENZA VACCINE  01/28/2024      07/08/2022    9:22 AM 07/10/2022   11:13 AM 08/03/2022    4:30 PM 08/25/2022   11:49 AM 09/24/2022     1:55 PM  Fall Risk  Falls in the past year?  1 1 1  0  Was there an injury with Fall?  1 1 0 0  Fall Risk Category Calculator  3 3 1  0  Fall Risk Category (Retired)  High     (RETIRED) Patient Fall Risk Level High fall risk High fall risk     Patient at Risk for Falls Due to  History of fall(s);Impaired balance/gait;Impaired mobility History of fall(s);Impaired balance/gait;Impaired mobility No Fall Risks   Fall risk Follow up  Falls evaluation completed Falls evaluation completed Falls evaluation completed    Functional Status Survey:    Vitals:   11/03/23 0959  BP: (!) 107/57  Pulse: 65  Resp: 16  Temp: (!) 97.3 F (36.3 C)  SpO2: 97%  Weight: 197 lb 8 oz (89.6 kg)  Height: 5\' 6"  (1.676 m)   Body mass  index is 31.88 kg/m. Physical Exam Constitutional:      General: He is not in acute distress.    Appearance: He is obese.  HENT:     Head: Normocephalic and atraumatic.     Mouth/Throat:     Mouth: Mucous membranes are moist.  Eyes:     Conjunctiva/sclera: Conjunctivae normal.  Cardiovascular:     Rate and Rhythm: Normal rate and regular rhythm.     Pulses: Normal pulses.     Heart sounds: Normal heart sounds.  Pulmonary:     Effort: Pulmonary effort is normal.     Breath sounds: Normal breath sounds.  Abdominal:     General: Bowel sounds are normal.     Palpations: Abdomen is soft.  Musculoskeletal:        General: No swelling.     Cervical back: Normal range of motion.  Skin:    General: Skin is warm and dry.  Neurological:     Mental Status: He is alert.  Psychiatric:        Mood and Affect: Mood normal.        Behavior: Behavior normal.     Labs reviewed: Recent Labs    07/15/23 0000 08/10/23 0000 08/17/23 0000  NA 139 131* 138  K 4.3 4.5 4.7  CL 103 100 104  CO2 27* 25* 28*  BUN 24* 19 17  CREATININE 1.1 1.2 1.2  CALCIUM 10.2 10.3 10.5   Recent Labs    05/25/23 0000 07/15/23 0000 08/10/23 0000  AST 19 20 19   ALT 13 10 11   ALKPHOS 84  82 75  ALBUMIN 3.8 3.7 3.8   Recent Labs    05/25/23 0000 07/15/23 0000 08/10/23 0000  WBC 10.6 9.1 9.3  NEUTROABS 7,590.00 8,199.00 6,547.00  HGB 11.9* 11.1* 10.5*  HCT 36* 34* 32*  PLT 322 296 347   Lab Results  Component Value Date   TSH 1.42 02/09/2023   Lab Results  Component Value Date   HGBA1C  08/24/2009    5.7 (NOTE) The ADA recommends the following therapeutic goal for glycemic control related to Hgb A1c measurement: Goal of therapy: <6.5 Hgb A1c  Reference: American Diabetes Association: Clinical Practice Recommendations 2010, Diabetes Care, 2010, 33: (Suppl  1).   Lab Results  Component Value Date   CHOL 116 12/03/2022   HDL 46 12/03/2022   LDLCALC 54 12/03/2022   LDLDIRECT 55 09/02/2020   TRIG 80 12/03/2022   CHOLHDL 3.3 09/02/2020    Significant Diagnostic Results in last 30 days:  No results found.  Assessment/Plan  1. Moderate cognitive impairment (Primary) -  continue current dosage of Donepezil  5 mg daily -  no dosage increase due to possibility o decreasing heart rate  2. Permanent atrial fibrillation (HCC) -  rate-controlled -  continue Eliquis  for anticoagulation   Family/ staff Communication:  Discussed plan of care with resident and charge nurse.  Labs/tests ordered:  None

## 2023-11-04 DIAGNOSIS — R29898 Other symptoms and signs involving the musculoskeletal system: Secondary | ICD-10-CM | POA: Diagnosis not present

## 2023-11-04 DIAGNOSIS — M62521 Muscle wasting and atrophy, not elsewhere classified, right upper arm: Secondary | ICD-10-CM | POA: Diagnosis not present

## 2023-11-04 DIAGNOSIS — M6281 Muscle weakness (generalized): Secondary | ICD-10-CM | POA: Diagnosis not present

## 2023-11-05 NOTE — Progress Notes (Signed)
 Lithium  level 0.4.  likely ok given long hx stability and pattern of low normal lithium  levels. No change Nori Beat, MD, DFAPA

## 2023-11-09 ENCOUNTER — Encounter: Payer: Self-pay | Admitting: Nurse Practitioner

## 2023-11-09 ENCOUNTER — Non-Acute Institutional Stay (SKILLED_NURSING_FACILITY): Payer: Self-pay | Admitting: Nurse Practitioner

## 2023-11-09 DIAGNOSIS — N1831 Chronic kidney disease, stage 3a: Secondary | ICD-10-CM

## 2023-11-09 DIAGNOSIS — F319 Bipolar disorder, unspecified: Secondary | ICD-10-CM

## 2023-11-09 DIAGNOSIS — M25562 Pain in left knee: Secondary | ICD-10-CM | POA: Diagnosis not present

## 2023-11-09 DIAGNOSIS — R4189 Other symptoms and signs involving cognitive functions and awareness: Secondary | ICD-10-CM | POA: Diagnosis not present

## 2023-11-09 DIAGNOSIS — I5042 Chronic combined systolic (congestive) and diastolic (congestive) heart failure: Secondary | ICD-10-CM

## 2023-11-09 DIAGNOSIS — L570 Actinic keratosis: Secondary | ICD-10-CM | POA: Diagnosis not present

## 2023-11-09 DIAGNOSIS — M150 Primary generalized (osteo)arthritis: Secondary | ICD-10-CM | POA: Diagnosis not present

## 2023-11-09 DIAGNOSIS — R001 Bradycardia, unspecified: Secondary | ICD-10-CM

## 2023-11-09 DIAGNOSIS — I4819 Other persistent atrial fibrillation: Secondary | ICD-10-CM

## 2023-11-09 DIAGNOSIS — L814 Other melanin hyperpigmentation: Secondary | ICD-10-CM | POA: Diagnosis not present

## 2023-11-09 DIAGNOSIS — M25762 Osteophyte, left knee: Secondary | ICD-10-CM | POA: Diagnosis not present

## 2023-11-09 DIAGNOSIS — L821 Other seborrheic keratosis: Secondary | ICD-10-CM | POA: Diagnosis not present

## 2023-11-09 DIAGNOSIS — M1712 Unilateral primary osteoarthritis, left knee: Secondary | ICD-10-CM | POA: Diagnosis not present

## 2023-11-09 NOTE — Assessment & Plan Note (Signed)
 Bun/creat 17/1.15 08/17/23

## 2023-11-09 NOTE — Progress Notes (Signed)
 Location:   [facility] Room Number: 18 Place of Service:  SNF (31) Provider: Abner Hoffman Miki Blank NP  Pcp, No  Patient Care Team: Pcp, No as PCP - General Manfred Seed, MD as PCP - Cardiology (Cardiology)  Extended Emergency Contact Information Primary Emergency Contact: Tomassi,[name] Address: [phone].           [facility] [address]          [place], [place] [address] [place] Home Phone: [phone] Mobile Phone: [phone] Relation: Spouse Secondary Emergency Contact: Klebba,Andy Address: [address]          [place], [place] [address] [place] Home Phone: [phone] Mobile Phone: [phone] Relation: Son  Code Status: DNR Goals of care: Advanced Directive information    08/31/2023    4:10 PM  Advanced Directives  Does Patient Have a Medical Advance Directive? Yes  Type of Advance Directive Out of facility DNR (pink MOST or yellow form)  Does patient want to make changes to medical advance directive? No - Patient declined  Pre-existing out of facility DNR order (yellow form or pink MOST form) Yellow form placed in chart (order not valid for inpatient use)     Chief Complaint  Patient presents with   Acute Visit    Left knee pain    HPI:  Pt is a 89 y.o. male seen today for an acute visit for c/o left knee pain, reported the patient twisted his knee when he was pushed forward in w/c w/o foot pedal on while out with his family over the weekend. Noted the patient was unable to stand from sitting position or bare weight. The was in pain with attempting PROM on my examination today. No bruise, redness, deformity, or swelling in the left knee, tibia, fibula noted. Hx of chronic left knee pian, but able to bear weight and ROM prior the event.   Urinary frequency, taking Tamsulosin .               HOH L>R, HPOA-wife declined hearing aids.              Parkinson's disease, stopped parkinson's medication because  of affected his cognition, had resting tremor in left arm/leg in the past             OP 12/01/22 dc'd Alendronate tscore -2.9 2017, -0.9 01/19/19, refused DEXA 11/02/22             OA shoulders pain, worsens at night, limited abduction ROM over shoulder level, hx of neck clicks with ROM, aches, c/o right sciatica pain, worsens in am, chronic left knee pain,  Norco and Gabapentin for pain control.              Cognitive impairment, SNF [facility] for supportive care, 07/16/22 MMSE 18/30, taking Deonepezil sine 8/21/2 by Psych             CVA, no focal weakness residual.              Afib, taking  Eliquis              Bradycardia, off Metoprolol , Diltiazem  2/2 to bradycardia. 08/24/23 38/60 sec with V tach, stopped Metoprolol  per cardiology             Bipolar disorder, followed by psychiatry, on Lithium , TSH 1.42 02/09/23, 07/16/22 Lithium  level 0.6(0.6-1.2)             Insomnia, takes Trazodone  since 02/17/23 by psych  CHF, EF 45-50% 11/2021, taking Furosemide, trace edema BLE, 05/10/23 CXR-mild pulmonary venous congestion, no infiltrate             Gait abnormality, uses w/c for mobility              Hyponatremia, Na 138 08/17/23             Hyperbilirubinemia, bilirubin 0.6 07/15/23            Polycythemia,  Hgb 11.1 07/15/23             CKD, Bun/creat 17/1.15 08/17/23             Hypogonadism, hx of  testosterone  inj             Hyperlipidemia, takes Pravastatin              Glaucoma, eye drops.     Past Medical History:  Diagnosis Date   AF (paroxysmal atrial fibrillation) (HCC) 02/16/2018   Atrial fibrillation (HCC)    Atrial fibrillation with RVR (HCC) 02/16/2018   Bipolar 1 disorder (HCC)    Bipolar disorder (HCC) 02/17/2018   Dizziness 02/26/2020   DJD (degenerative joint disease)    Dyslipidemia 02/26/2020   Gait instability 02/16/2018   Hypertension    Mitral regurgitation    mild    Polycythemia 02/16/2018   Renal insufficiency    Stroke [facility])    Tremor 02/16/2018   Tremors of nervous  system 01/2018   Past Surgical History:  Procedure Laterality Date   SHOULDER SURGERY      Allergies  Allergen Reactions   Lipitor [Atorvastatin] Other (See Comments)    Joint soreness    Allergies as of 11/09/2023       Reactions   Lipitor [atorvastatin] Other (See Comments)   Joint soreness        Medication List        Accurate as of Nov 09, 2023 12:05 PM. If you have any questions, ask your nurse or doctor.          ascorbic acid  500 MG tablet Commonly known as: VITAMIN C  Take 500 mg by mouth daily.   Calcium 600+D Plus Minerals 600-400 MG-UNIT Chew Chew 1 Piece by mouth in the morning and at bedtime.   docusate sodium  100 MG capsule Commonly known as: Colace Take 1 capsule (100 mg total) by mouth 2 (two) times daily.   donepezil  5 MG tablet Commonly known as: ARICEPT  Take 5 mg by mouth at bedtime.   Eliquis  5 MG Tabs tablet Generic drug: apixaban  TAKE 1 TABLET BY MOUTH TWICE DAILY.   furosemide 20 MG tablet Commonly known as: LASIX Take 20 mg by mouth daily.   gabapentin 100 MG capsule Commonly known as: NEURONTIN Take 200 mg by mouth at bedtime.   HYDROcodone -acetaminophen  5-325 MG tablet Commonly known as: NORCO/VICODIN Take 0.5 tablets by mouth in the morning and at bedtime.   latanoprost  0.005 % ophthalmic solution Commonly known as: XALATAN  Place 1 drop into both eyes at bedtime.   lithium  carbonate 300 MG ER tablet Commonly known as: LITHOBID  Take 1 tablet (300 mg total) by mouth every evening.   multivitamin with minerals Tabs tablet Take 1 tablet by mouth daily.   Myrbetriq 25 MG Tb24 tablet Generic drug: mirabegron ER Take 25 mg by mouth daily.   potassium chloride 10 MEQ CR capsule Commonly known as: MICRO-K Take 10 mEq by mouth daily.   pravastatin  20 MG tablet Commonly known as: PRAVACHOL  Take  20 mg by mouth at bedtime.   sodium chloride  5 % ophthalmic solution Commonly known as: MURO 128 Place 1 drop into both  eyes at bedtime.   Systane 0.4-0.3 % Soln Generic drug: Polyethyl Glycol-Propyl Glycol Apply 1 drop to eye every 4 (four) hours as needed (dry eyes). And four times a day scheduled   tamsulosin  0.4 MG Caps capsule Commonly known as: FLOMAX  Take 1 capsule (0.4 mg total) by mouth daily.   timolol  0.5 % ophthalmic solution Commonly known as: BETIMOL  Place 1 drop into both eyes daily.   traZODone  50 MG tablet Commonly known as: DESYREL  Take 1 tablet (50 mg total) by mouth at bedtime.   triamcinolone  cream 0.1 % Commonly known as: KENALOG  Apply 1 Application topically 2 (two) times daily.        Review of Systems  Constitutional:  Negative for appetite change, fatigue and fever.  HENT:  Positive for hearing loss. Negative for congestion and trouble swallowing.   Eyes:  Negative for visual disturbance.  Respiratory:  Negative for cough.   Cardiovascular:  Positive for leg swelling.  Gastrointestinal:  Negative for abdominal pain and constipation.  Genitourinary:  Negative for dysuria and urgency.       Incontinent of urine.   Musculoskeletal:  Positive for arthralgias, back pain and gait problem.       Shoulders pain with above shoulder level abduction ROM, worsens at night. R sciatic pain, not new, worse in am when getting up, chronic left knee pain-worsened pain now Worsened left neck/shoulder pain /tingling, numbness arm, no decreased muscle strength.   Skin:  Negative for color change.  Neurological:  Negative for speech difficulty, weakness and headaches.  Psychiatric/Behavioral:  Positive for confusion and sleep disturbance. The patient is not nervous/anxious.        Early am awake, chronic    Immunization History  Administered Date(s) Administered   Hepatitis B, ADULT 11/26/1997, 12/27/1997, 05/08/1998   Influenza, High Dose Seasonal PF 03/31/2023   Influenza-Unspecified 04/15/2022   Moderna Covid-19 Vaccine Bivalent Booster 34yrs & up 04/14/2023   PPD Test  07/08/2022   Pneumococcal Conjugate-13 04/26/2014   Pneumococcal Polysaccharide-23 04/14/2005   RSV,unspecified 08/19/2022   Td 08/04/2022   Tdap 05/18/2016   Zoster Recombinant(Shingrix) 07/29/2022, 10/28/2022   Pertinent  Health Maintenance Due  Topic Date Due   INFLUENZA VACCINE  01/28/2024      07/08/2022    9:22 AM 07/10/2022   11:13 AM 08/03/2022    4:30 PM 08/25/2022   11:49 AM 09/24/2022    1:55 PM  Fall Risk  Falls in the past year?  1 1 1  0  Was there an injury with Fall?  1 1 0 0  Fall Risk Category Calculator  3 3 1  0  Fall Risk Category (Retired)  High     (RETIRED) Patient Fall Risk Level High fall risk High fall risk     Patient at Risk for Falls Due to  History of fall(s);Impaired balance/gait;Impaired mobility History of fall(s);Impaired balance/gait;Impaired mobility No Fall Risks   Fall risk Follow up  Falls evaluation completed Falls evaluation completed Falls evaluation completed    Functional Status Survey:    Vitals:   11/09/23 1111  BP: 127/60  Pulse: 66  Resp: 20  Temp: (!) 97.3 F (36.3 C)  SpO2: 98%  Weight: 189 lb 8 oz (86 kg)   Body mass index is 30.59 kg/m. Physical Exam Vitals and nursing note reviewed.  Constitutional:  Appearance: Normal appearance.  HENT:     Head: Normocephalic and atraumatic.     Nose: Nose normal.     Mouth/Throat:     Mouth: Mucous membranes are moist.  Eyes:     Extraocular Movements: Extraocular movements intact.     Conjunctiva/sclera: Conjunctivae normal.     Pupils: Pupils are equal, round, and reactive to light.  Cardiovascular:     Rate and Rhythm: Normal rate. Rhythm irregular.     Heart sounds: No murmur heard. Pulmonary:     Effort: Pulmonary effort is normal.     Breath sounds: No rales.  Abdominal:     General: Bowel sounds are normal.     Palpations: Abdomen is soft.     Tenderness: There is no abdominal tenderness.  Musculoskeletal:        General: Tenderness present. No swelling,  deformity or signs of injury.     Cervical back: Normal range of motion and neck supple.     Right lower leg: Edema present.     Left lower leg: Edema present.     Comments: trace edema BLE.  Shoulders pain with above shoulder level abduction ROM, worsens at night, R sciatica pain worse in am/getting out of bed, chronic left knee pain-worsened-unable to bear weight and severe pain with attempting PROM  Skin:    General: Skin is warm and dry.     Comments: The right 5th toe nail yellow thick from previous examination   Neurological:     General: No focal deficit present.     Mental Status: He is alert and oriented to person, place, and time. Mental status is at baseline.     Motor: No weakness.     Gait: Gait abnormal.  Psychiatric:        Mood and Affect: Mood normal.        Behavior: Behavior normal.        Thought Content: Thought content normal.     Labs reviewed: Recent Labs    07/15/23 0000 08/10/23 0000 08/17/23 0000  NA 139 131* 138  K 4.3 4.5 4.7  CL 103 100 104  CO2 27* 25* 28*  BUN 24* 19 17  CREATININE 1.1 1.2 1.2  CALCIUM 10.2 10.3 10.5   Recent Labs    05/25/23 0000 07/15/23 0000 08/10/23 0000  AST 19 20 19   ALT 13 10 11   ALKPHOS 84 82 75  ALBUMIN 3.8 3.7 3.8   Recent Labs    05/25/23 0000 07/15/23 0000 08/10/23 0000  WBC 10.6 9.1 9.3  NEUTROABS 7,590.00 8,199.00 6,547.00  HGB 11.9* 11.1* 10.5*  HCT 36* 34* 32*  PLT 322 296 347   Lab Results  Component Value Date   TSH 1.42 02/09/2023   Lab Results  Component Value Date   HGBA1C  08/24/2009    5.7 (NOTE) The ADA recommends the following therapeutic goal for glycemic control related to Hgb A1c measurement: Goal of therapy: <6.5 Hgb A1c  Reference: American Diabetes Association: Clinical Practice Recommendations 2010, Diabetes Care, 2010, 33: (Suppl  1).   Lab Results  Component Value Date   CHOL 116 12/03/2022   HDL 46 12/03/2022   LDLCALC 54 12/03/2022   LDLDIRECT 55 09/02/2020    TRIG 80 12/03/2022   CHOLHDL 3.3 09/02/2020    Significant Diagnostic Results in last 30 days:  No results found.  Assessment/Plan: Left knee pain c/o left knee pain, reported the patient twisted his knee when he was pushed forward  in w/c w/o foot pedal on while out with his family over the weekend. Noted the patient was unable to stand from sitting position or bare weight. The was in pain with attempting PROM on my examination today. No bruise, redness, deformity, or swelling in the left knee, tibia, fibula noted.   Xray left knee 3 views to r/o fx Apply Voltaren 1% gel 4gm qid x 2 weeks. Continue Norco(drowsiness with higher dose in the past) and Gabapentin.   Osteoarthritis, multiple sites  shoulders pain, worsens at night, limited abduction ROM over shoulder level, hx of neck clicks with ROM, aches, c/o right sciatica pain, worsens in am, chronic left knee pain,  Norco and Gabapentin for pain control.   Cognitive impairment  SNF [facility] for supportive care, 07/16/22 MMSE 18/30, taking Deonepezil sine 8/21/2 by Psych  Atrial fibrillation, persistent (HCC) Heart rate is in control, taking  Eliquis   Bradycardia Resolved, off Metoprolol , Diltiazem  2/2 to bradycardia. 08/24/23 38/60 sec with V tach, stopped Metoprolol  per cardiology  Bipolar disorder [facility]) followed by psychiatry, on Lithium , TSH 1.42 02/09/23, 07/16/22 Lithium  level 0.6(0.6-1.2)             Insomnia, takes Trazodone  since 02/17/23 by psych  Chronic CHF (HCC) EF 45-50% 11/2021, taking Furosemide, trace edema BLE, 05/10/23 CXR-mild pulmonary venous congestion, no infiltrate  CKD (chronic kidney disease) stage 3, GFR 30-59 ml/min (HCC) Bun/creat 17/1.15 08/17/23    Family/ staff Communication: plan of care reviewed with the patient and charge nurse.   Labs/tests ordered:  X-ray left knee

## 2023-11-09 NOTE — Assessment & Plan Note (Signed)
 followed by psychiatry, on Lithium, TSH 1.42 02/09/23, 07/16/22 Lithium level 0.6(0.6-1.2)             Insomnia, takes Trazodone since 02/17/23 by psych

## 2023-11-09 NOTE — Assessment & Plan Note (Signed)
 Resolved, off Metoprolol , Diltiazem  2/2 to bradycardia. 08/24/23 38/60 sec with V tach, stopped Metoprolol  per cardiology

## 2023-11-09 NOTE — Assessment & Plan Note (Signed)
 SNF [facility] for supportive care, 07/16/22 MMSE 18/30, taking Deonepezil sine 8/21/2 by Merilyn Baba

## 2023-11-09 NOTE — Assessment & Plan Note (Addendum)
 c/o left knee pain, reported the patient twisted his knee when he was pushed forward in w/c w/o foot pedal on while out with his family over the weekend. Noted the patient was unable to stand from sitting position or bare weight. The was in pain with attempting PROM on my examination today. No bruise, redness, deformity, or swelling in the left knee, tibia, fibula noted.   Xray left knee 3 views to r/o fx Apply Voltaren 1% gel 4gm qid x 2 weeks. Continue Norco(drowsiness with higher dose in the past) and Gabapentin.

## 2023-11-09 NOTE — Assessment & Plan Note (Signed)
 shoulders pain, worsens at night, limited abduction ROM over shoulder level, hx of neck clicks with ROM, aches, c/o right sciatica pain, worsens in am, chronic left knee pain,  Norco and Gabapentin for pain control.

## 2023-11-09 NOTE — Assessment & Plan Note (Signed)
 EF 45-50% 11/2021, taking Furosemide, trace edema BLE, 05/10/23 CXR-mild pulmonary venous congestion, no infiltrate

## 2023-11-09 NOTE — Assessment & Plan Note (Signed)
Heart rate is in control, taking Eliquis

## 2023-11-10 ENCOUNTER — Encounter: Payer: Self-pay | Admitting: Adult Health

## 2023-11-10 ENCOUNTER — Non-Acute Institutional Stay (SKILLED_NURSING_FACILITY): Payer: Self-pay | Admitting: Adult Health

## 2023-11-10 DIAGNOSIS — R600 Localized edema: Secondary | ICD-10-CM | POA: Diagnosis not present

## 2023-11-10 DIAGNOSIS — M6281 Muscle weakness (generalized): Secondary | ICD-10-CM | POA: Diagnosis not present

## 2023-11-10 DIAGNOSIS — M7732 Calcaneal spur, left foot: Secondary | ICD-10-CM | POA: Diagnosis not present

## 2023-11-10 DIAGNOSIS — R29898 Other symptoms and signs involving the musculoskeletal system: Secondary | ICD-10-CM | POA: Diagnosis not present

## 2023-11-10 DIAGNOSIS — M25572 Pain in left ankle and joints of left foot: Secondary | ICD-10-CM

## 2023-11-10 DIAGNOSIS — M62521 Muscle wasting and atrophy, not elsewhere classified, right upper arm: Secondary | ICD-10-CM | POA: Diagnosis not present

## 2023-11-10 NOTE — Progress Notes (Signed)
 Location:  [facility] Room Number: 18 A Place of Service:  SNF (31) Provider:  Medina-Vargas, Mikiya Nebergall, DNP, FNP-BC  Patient Care Team: Pcp, No as PCP - General Krasowski, Robert J, MD as PCP - Cardiology (Cardiology)  Extended Emergency Contact Information Primary Emergency Contact: Volante,[name] Address: [phone]           [facility] [address]          [place], [place] [address] [place] Home Phone: [phone] Mobile Phone: [phone] Relation: Spouse Secondary Emergency Contact: Lanes,Andy Address: [address].          [place], [place] [address] [place] Home Phone: [phone] Mobile Phone: [phone] Relation: Son  Code Status:   DNR  Goals of care: Advanced Directive information    08/31/2023    4:10 PM  Advanced Directives  Does Patient Have a Medical Advance Directive? Yes  Type of Advance Directive Out of facility DNR (pink MOST or yellow form)  Does patient want to make changes to medical advance directive? No - Patient declined  Pre-existing out of facility DNR order (yellow form or pink MOST form) Yellow form placed in chart (order not valid for inpatient use)     Chief Complaint  Patient presents with   Acute Visit    Left ankle pain    HPI:  Pt is a 89 y.o. male seen today for an acute visit regarding left ankle pain. He is a resident of [facility]. He went out with family over the weekend (3 days ago) with wheelchair sans foot pedals. He complained of left knee pain yesterday and x-ray was negative for fracture. Today, he complained of left ankle pain. Left ankle noted to have 1+edema. Left ankle is warm to touch, no erythema.   Past Medical History:  Diagnosis Date   AF (paroxysmal atrial fibrillation) (HCC) 02/16/2018   Atrial fibrillation (HCC)    Atrial fibrillation with RVR (HCC) 02/16/2018   Bipolar 1 disorder (HCC)    Bipolar disorder (HCC) 02/17/2018    Dizziness 02/26/2020   DJD (degenerative joint disease)    Dyslipidemia 02/26/2020   Gait instability 02/16/2018   Hypertension    Mitral regurgitation    mild    Polycythemia 02/16/2018   Renal insufficiency    Stroke [facility])    Tremor 02/16/2018   Tremors of nervous system 01/2018   Past Surgical History:  Procedure Laterality Date   SHOULDER SURGERY      Allergies  Allergen Reactions   Lipitor [Atorvastatin] Other (See Comments)    Joint soreness    Outpatient Encounter Medications as of 11/10/2023  Medication Sig   ascorbic acid  (VITAMIN C ) 500 MG tablet Take 500 mg by mouth daily.   Calcium Carbonate-Vit D-Min (CALCIUM 600+D PLUS MINERALS) 600-400 MG-UNIT CHEW Chew 1 Piece by mouth in the morning and at bedtime.   docusate sodium  (COLACE) 100 MG capsule Take 1 capsule (100 mg total) by mouth 2 (two) times daily.   donepezil  (ARICEPT ) 5 MG tablet Take 5 mg by mouth at bedtime.   ELIQUIS  5 MG TABS tablet TAKE 1 TABLET BY MOUTH TWICE DAILY.   furosemide (LASIX) 20 MG tablet Take 20 mg by mouth daily.   gabapentin (NEURONTIN) 100 MG capsule Take 200 mg by mouth at bedtime.   HYDROcodone -acetaminophen  (NORCO/VICODIN) 5-325 MG tablet Take 0.5 tablets by mouth in the morning and at bedtime.   latanoprost  (XALATAN ) 0.005 %  ophthalmic solution Place 1 drop into both eyes at bedtime.   lithium  carbonate (LITHOBID ) 300 MG ER tablet Take 1 tablet (300 mg total) by mouth every evening.   mirabegron ER (MYRBETRIQ) 25 MG TB24 tablet Take 25 mg by mouth daily.   Multiple Vitamin (MULTIVITAMIN WITH MINERALS) TABS tablet Take 1 tablet by mouth daily.   Polyethyl Glycol-Propyl Glycol (SYSTANE) 0.4-0.3 % SOLN Apply 1 drop to eye every 4 (four) hours as needed (dry eyes). And four times a day scheduled   potassium chloride (MICRO-K) 10 MEQ CR capsule Take 10 mEq by mouth daily.   pravastatin  (PRAVACHOL ) 20 MG tablet Take 20 mg by mouth at bedtime.   sodium chloride  (MURO 128) 5 % ophthalmic  solution Place 1 drop into both eyes at bedtime.   tamsulosin  (FLOMAX ) 0.4 MG CAPS capsule Take 1 capsule (0.4 mg total) by mouth daily.   timolol  (BETIMOL ) 0.5 % ophthalmic solution Place 1 drop into both eyes daily.   traZODone  (DESYREL ) 50 MG tablet Take 1 tablet (50 mg total) by mouth at bedtime.   triamcinolone  cream (KENALOG ) 0.1 % Apply 1 Application topically 2 (two) times daily. (Patient not taking: Reported on 11/03/2023)   No facility-administered encounter medications on file as of 11/10/2023.    Review of Systems  Constitutional:  Negative for activity change, appetite change and fever.  HENT:  Negative for sore throat.   Eyes: Negative.   Cardiovascular:  Negative for chest pain and leg swelling.  Gastrointestinal:  Negative for abdominal distention, diarrhea and vomiting.  Genitourinary:  Negative for dysuria, frequency and urgency.  Musculoskeletal:  Positive for joint swelling.  Skin:  Negative for color change.  Neurological:  Negative for dizziness and headaches.  Psychiatric/Behavioral:  Negative for behavioral problems and sleep disturbance. The patient is not nervous/anxious.        Immunization History  Administered Date(s) Administered   Hepatitis B, ADULT 11/26/1997, 12/27/1997, 05/08/1998   Influenza, High Dose Seasonal PF 03/31/2023   Influenza-Unspecified 04/15/2022   Moderna Covid-19 Vaccine Bivalent Booster 52yrs & up 04/14/2023   PPD Test 07/08/2022   Pneumococcal Conjugate-13 04/26/2014   Pneumococcal Polysaccharide-23 04/14/2005   RSV,unspecified 08/19/2022   Td 08/04/2022   Tdap 05/18/2016   Zoster Recombinant(Shingrix) 07/29/2022, 10/28/2022   Pertinent  Health Maintenance Due  Topic Date Due   INFLUENZA VACCINE  01/28/2024      07/08/2022    9:22 AM 07/10/2022   11:13 AM 08/03/2022    4:30 PM 08/25/2022   11:49 AM 09/24/2022    1:55 PM  Fall Risk  Falls in the past year?  1 1 1  0  Was there an injury with Fall?  1 1 0 0  Fall Risk  Category Calculator  3 3 1  0  Fall Risk Category (Retired)  High     (RETIRED) Patient Fall Risk Level High fall risk High fall risk     Patient at Risk for Falls Due to  History of fall(s);Impaired balance/gait;Impaired mobility History of fall(s);Impaired balance/gait;Impaired mobility No Fall Risks   Fall risk Follow up  Falls evaluation completed Falls evaluation completed Falls evaluation completed      Vitals:   11/10/23 1718  BP: (!) 119/53  Pulse: (!) 56  Resp: 18  Temp: 97.6 F (36.4 C)  SpO2: 98%  Weight: 189 lb 8 oz (86 kg)  Height: 5\' 6"  (1.676 m)   Body mass index is 30.59 kg/m.  Physical Exam Constitutional:      Appearance:  Normal appearance.  HENT:     Head: Normocephalic and atraumatic.     Mouth/Throat:     Mouth: Mucous membranes are moist.  Eyes:     Conjunctiva/sclera: Conjunctivae normal.  Cardiovascular:     Rate and Rhythm: Normal rate and regular rhythm.     Pulses: Normal pulses.     Heart sounds: Normal heart sounds.  Pulmonary:     Effort: Pulmonary effort is normal.     Breath sounds: Normal breath sounds.  Abdominal:     General: Bowel sounds are normal.     Palpations: Abdomen is soft.  Musculoskeletal:        General: Swelling present. Normal range of motion.     Cervical back: Normal range of motion.     Left lower leg: Edema present.     Comments: Left ankle edema 1+  Skin:    General: Skin is warm and dry.  Neurological:     Mental Status: He is alert.  Psychiatric:        Mood and Affect: Mood normal.        Behavior: Behavior normal.       Labs reviewed: Recent Labs    07/15/23 0000 08/10/23 0000 08/17/23 0000  NA 139 131* 138  K 4.3 4.5 4.7  CL 103 100 104  CO2 27* 25* 28*  BUN 24* 19 17  CREATININE 1.1 1.2 1.2  CALCIUM 10.2 10.3 10.5   Recent Labs    05/25/23 0000 07/15/23 0000 08/10/23 0000  AST 19 20 19   ALT 13 10 11   ALKPHOS 84 82 75  ALBUMIN 3.8 3.7 3.8   Recent Labs    05/25/23 0000  07/15/23 0000 08/10/23 0000  WBC 10.6 9.1 9.3  NEUTROABS 7,590.00 8,199.00 6,547.00  HGB 11.9* 11.1* 10.5*  HCT 36* 34* 32*  PLT 322 296 347   Lab Results  Component Value Date   TSH 1.42 02/09/2023   Lab Results  Component Value Date   HGBA1C  08/24/2009    5.7 (NOTE) The ADA recommends the following therapeutic goal for glycemic control related to Hgb A1c measurement: Goal of therapy: <6.5 Hgb A1c  Reference: American Diabetes Association: Clinical Practice Recommendations 2010, Diabetes Care, 2010, 33: (Suppl  1).   Lab Results  Component Value Date   CHOL 116 12/03/2022   HDL 46 12/03/2022   LDLCALC 54 12/03/2022   LDLDIRECT 55 09/02/2020   TRIG 80 12/03/2022   CHOLHDL 3.3 09/02/2020    Significant Diagnostic Results in last 30 days:  No results found.  Assessment/Plan  1. Acute left ankle pain (Primary) -  went out with family 3 days ago using a wheelchair with no foot pedals. -  now complaining of pain, ankle warm to touch and 1+edema -  ordered x-ray of left ankle to rule out fracture -  continue Norco 5-325 mg 0.5 tab  Q AM and bedtime for pain  Family/ staff Communication: Discussed plan of care with resident and charge nurse.  Labs/tests ordered: None    Zani Kyllonen Medina-Vargas, DNP, MSN, FNP-BC [facility] [phone] (Monday-Friday 8:00 a.m. - 5:00 p.m.) [phone] (after hours)

## 2023-11-11 DIAGNOSIS — E291 Testicular hypofunction: Secondary | ICD-10-CM | POA: Diagnosis not present

## 2023-11-11 DIAGNOSIS — M6281 Muscle weakness (generalized): Secondary | ICD-10-CM | POA: Diagnosis not present

## 2023-11-11 DIAGNOSIS — R29898 Other symptoms and signs involving the musculoskeletal system: Secondary | ICD-10-CM | POA: Diagnosis not present

## 2023-11-11 DIAGNOSIS — M62521 Muscle wasting and atrophy, not elsewhere classified, right upper arm: Secondary | ICD-10-CM | POA: Diagnosis not present

## 2023-11-13 DIAGNOSIS — M6281 Muscle weakness (generalized): Secondary | ICD-10-CM | POA: Diagnosis not present

## 2023-11-13 DIAGNOSIS — R29898 Other symptoms and signs involving the musculoskeletal system: Secondary | ICD-10-CM | POA: Diagnosis not present

## 2023-11-13 DIAGNOSIS — M62521 Muscle wasting and atrophy, not elsewhere classified, right upper arm: Secondary | ICD-10-CM | POA: Diagnosis not present

## 2023-11-16 ENCOUNTER — Other Ambulatory Visit: Payer: Self-pay | Admitting: Psychiatry

## 2023-11-16 DIAGNOSIS — M6281 Muscle weakness (generalized): Secondary | ICD-10-CM | POA: Diagnosis not present

## 2023-11-16 DIAGNOSIS — M62521 Muscle wasting and atrophy, not elsewhere classified, right upper arm: Secondary | ICD-10-CM | POA: Diagnosis not present

## 2023-11-16 DIAGNOSIS — R29898 Other symptoms and signs involving the musculoskeletal system: Secondary | ICD-10-CM | POA: Diagnosis not present

## 2023-11-16 MED ORDER — DONEPEZIL HCL 10 MG PO TABS: 10.0000 mg | ORAL_TABLET | Freq: Every day | ORAL | 3 refills | Status: DC

## 2023-11-17 DIAGNOSIS — N400 Enlarged prostate without lower urinary tract symptoms: Secondary | ICD-10-CM | POA: Diagnosis not present

## 2023-11-17 DIAGNOSIS — M6281 Muscle weakness (generalized): Secondary | ICD-10-CM | POA: Diagnosis not present

## 2023-11-17 DIAGNOSIS — M62521 Muscle wasting and atrophy, not elsewhere classified, right upper arm: Secondary | ICD-10-CM | POA: Diagnosis not present

## 2023-11-17 DIAGNOSIS — R29898 Other symptoms and signs involving the musculoskeletal system: Secondary | ICD-10-CM | POA: Diagnosis not present

## 2023-11-17 DIAGNOSIS — E291 Testicular hypofunction: Secondary | ICD-10-CM | POA: Diagnosis not present

## 2023-11-19 DIAGNOSIS — R29898 Other symptoms and signs involving the musculoskeletal system: Secondary | ICD-10-CM | POA: Diagnosis not present

## 2023-11-19 DIAGNOSIS — M6281 Muscle weakness (generalized): Secondary | ICD-10-CM | POA: Diagnosis not present

## 2023-11-19 DIAGNOSIS — M62521 Muscle wasting and atrophy, not elsewhere classified, right upper arm: Secondary | ICD-10-CM | POA: Diagnosis not present

## 2023-11-22 DIAGNOSIS — M6281 Muscle weakness (generalized): Secondary | ICD-10-CM | POA: Diagnosis not present

## 2023-11-22 DIAGNOSIS — M62521 Muscle wasting and atrophy, not elsewhere classified, right upper arm: Secondary | ICD-10-CM | POA: Diagnosis not present

## 2023-11-22 DIAGNOSIS — R29898 Other symptoms and signs involving the musculoskeletal system: Secondary | ICD-10-CM | POA: Diagnosis not present

## 2023-11-24 DIAGNOSIS — R29898 Other symptoms and signs involving the musculoskeletal system: Secondary | ICD-10-CM | POA: Diagnosis not present

## 2023-11-24 DIAGNOSIS — M6281 Muscle weakness (generalized): Secondary | ICD-10-CM | POA: Diagnosis not present

## 2023-11-24 DIAGNOSIS — M62521 Muscle wasting and atrophy, not elsewhere classified, right upper arm: Secondary | ICD-10-CM | POA: Diagnosis not present

## 2023-11-25 ENCOUNTER — Non-Acute Institutional Stay (SKILLED_NURSING_FACILITY): Payer: Self-pay | Admitting: Sports Medicine

## 2023-11-25 DIAGNOSIS — R29898 Other symptoms and signs involving the musculoskeletal system: Secondary | ICD-10-CM | POA: Diagnosis not present

## 2023-11-25 DIAGNOSIS — F0390 Unspecified dementia without behavioral disturbance: Secondary | ICD-10-CM

## 2023-11-25 DIAGNOSIS — M6281 Muscle weakness (generalized): Secondary | ICD-10-CM | POA: Diagnosis not present

## 2023-11-25 DIAGNOSIS — T148XXA Other injury of unspecified body region, initial encounter: Secondary | ICD-10-CM | POA: Diagnosis not present

## 2023-11-25 DIAGNOSIS — M62521 Muscle wasting and atrophy, not elsewhere classified, right upper arm: Secondary | ICD-10-CM | POA: Diagnosis not present

## 2023-11-25 DIAGNOSIS — F319 Bipolar disorder, unspecified: Secondary | ICD-10-CM

## 2023-11-25 DIAGNOSIS — M159 Polyosteoarthritis, unspecified: Secondary | ICD-10-CM

## 2023-11-25 DIAGNOSIS — I4891 Unspecified atrial fibrillation: Secondary | ICD-10-CM | POA: Diagnosis not present

## 2023-11-25 NOTE — Progress Notes (Signed)
 Provider:  Dr. Tye Gall Location:  [facility] Place of Service:   skilled care   PCP: Pcp, No Patient Care Team: Pcp, No as PCP - General Manfred Seed, MD as PCP - Cardiology (Cardiology)  Extended Emergency Contact Information Primary Emergency Contact: Wyne,[name] Address: [phone]           [facility] [address]          [place], [place] [address] [place] Home Phone: [phone] Mobile Phone: [phone] Relation: Spouse Secondary Emergency Contact: Malak,Andy Address: [address]          [place], [place] [address] [place] Home Phone: [phone] Mobile Phone: [phone] Relation: Son  Goals of Care: Advanced Directive information    08/31/2023    4:10 PM  Advanced Directives  Does Patient Have a Medical Advance Directive? Yes  Type of Advance Directive Out of facility DNR (pink MOST or yellow form)  Does patient want to make changes to medical advance directive? No - Patient declined  Pre-existing out of facility DNR order (yellow form or pink MOST form) Yellow form placed in chart (order not valid for inpatient use)       History of Present Illness  89 year old male with a past medical history of Parkinson's disease, dementia, osteoarthritis, CVA, A-fib, bipolar disorder, CHF is evaluated for an acute visit for open wound on his left cheek. Patient seen and examined in his room.  He seems pleasant and comfortable Sitting reclined in chair.   Staff reported that he has been picking on the wound as has a small open wound on his left cheek.  Patient denies facial pain, headaches, sinus congestion, sore throat, fevers, chills. Patient ambulates with the wheelchair.  Informs that he likes to participate in group activities. No agitation or behavioral changes reported by the nursing staff.     Past Medical History:  Diagnosis Date   AF (paroxysmal atrial fibrillation) (HCC)  02/16/2018   Atrial fibrillation (HCC)    Atrial fibrillation with RVR (HCC) 02/16/2018   Bipolar 1 disorder (HCC)    Bipolar disorder (HCC) 02/17/2018   Dizziness 02/26/2020   DJD (degenerative joint disease)    Dyslipidemia 02/26/2020   Gait instability 02/16/2018   Hypertension    Mitral regurgitation    mild    Polycythemia 02/16/2018   Renal insufficiency    Stroke [facility])    Tremor 02/16/2018   Tremors of nervous system 01/2018   Past Surgical History:  Procedure Laterality Date   SHOULDER SURGERY      reports that he has quit smoking. He has never used smokeless tobacco. He reports current alcohol use of about 1.0 standard drink of alcohol per week. He reports that he does not use drugs. Social History   Socioeconomic History   Marital status: Married    Spouse name: Not on file   Number of children: Not on file   Years of education: Not on file   Highest education level: Not on file  Occupational History   Not on file  Tobacco Use   Smoking status: Former   Smokeless tobacco: Never  Vaping Use   Vaping status: Never Used  Substance and Sexual Activity   Alcohol use: Yes    Alcohol/week: 1.0 standard drink of alcohol    Types: 1 Glasses of wine per week    Comment: 1 per month   Drug use: No   Sexual activity: Not on file  Other Topics Concern   Not on file  Social History Narrative   Not on file   Social Drivers of Health   Financial Resource Strain: Not on file  Food Insecurity: No Food Insecurity (07/02/2022)   Hunger Vital Sign    Worried About Running Out of Food in the Last Year: Never true    Ran Out of Food in the Last Year: Never true  Transportation Needs: No Transportation Needs (07/02/2022)   PRAPARE - Administrator, Civil Service (Medical): No    Lack of Transportation (Non-Medical): No  Physical Activity: Not on file  Stress: Not on file  Social Connections: Not on file  Intimate Partner Violence: Not At Risk (07/02/2022)    Humiliation, Afraid, Rape, and Kick questionnaire    Fear of Current or Ex-Partner: No    Emotionally Abused: No    Physically Abused: No    Sexually Abused: No    Functional Status Survey:    Family History  Problem Relation Age of Onset   Alcohol abuse Mother    Cancer Father     Health Maintenance  Topic Date Due   COVID-19 Vaccine (2 - Moderna risk series) 05/12/2023   INFLUENZA VACCINE  01/28/2024   Medicare Annual Wellness (AWV)  07/26/2024   DTaP/Tdap/Td (3 - Td or Tdap) 08/04/2032   Pneumonia Vaccine 31+ Years old  Completed   Zoster Vaccines- Shingrix  Completed   HPV VACCINES  Aged Out   Meningococcal B Vaccine  Aged Out    Allergies  Allergen Reactions   Lipitor [Atorvastatin] Other (See Comments)    Joint soreness    Outpatient Encounter Medications as of 11/25/2023  Medication Sig   ascorbic acid  (VITAMIN C ) 500 MG tablet Take 500 mg by mouth daily.   Calcium Carbonate-Vit D-Min (CALCIUM 600+D PLUS MINERALS) 600-400 MG-UNIT CHEW Chew 1 Piece by mouth in the morning and at bedtime.   docusate sodium  (COLACE) 100 MG capsule Take 1 capsule (100 mg total) by mouth 2 (two) times daily.   donepezil  (ARICEPT ) 10 MG tablet Take 1 tablet (10 mg total) by mouth at bedtime.   ELIQUIS  5 MG TABS tablet TAKE 1 TABLET BY MOUTH TWICE DAILY.   furosemide (LASIX) 20 MG tablet Take 20 mg by mouth daily.   gabapentin (NEURONTIN) 100 MG capsule Take 200 mg by mouth at bedtime.   HYDROcodone -acetaminophen  (NORCO/VICODIN) 5-325 MG tablet Take 0.5 tablets by mouth in the morning and at bedtime.   latanoprost  (XALATAN ) 0.005 % ophthalmic solution Place 1 drop into both eyes at bedtime.   lithium  carbonate (LITHOBID ) 300 MG ER tablet Take 1 tablet (300 mg total) by mouth every evening.   mirabegron ER (MYRBETRIQ) 25 MG TB24 tablet Take 25 mg by mouth daily.   Multiple Vitamin (MULTIVITAMIN WITH MINERALS) TABS tablet Take 1 tablet by mouth daily.   Polyethyl Glycol-Propyl Glycol  (SYSTANE) 0.4-0.3 % SOLN Apply 1 drop to eye every 4 (four) hours as needed (dry eyes). And four times a day scheduled   potassium chloride (MICRO-K) 10 MEQ CR capsule Take 10 mEq by mouth daily.   pravastatin  (PRAVACHOL ) 20 MG tablet Take 20 mg by mouth at bedtime.   sodium chloride  (MURO 128) 5 % ophthalmic solution Place 1 drop into both eyes at bedtime.   tamsulosin  (FLOMAX ) 0.4 MG CAPS capsule Take 1 capsule (0.4 mg total) by mouth daily.   timolol  (BETIMOL ) 0.5 % ophthalmic solution Place 1 drop into both eyes daily.  traZODone  (DESYREL ) 50 MG tablet Take 1 tablet (50 mg total) by mouth at bedtime.   triamcinolone  cream (KENALOG ) 0.1 % Apply 1 Application topically 2 (two) times daily. (Patient not taking: Reported on 11/03/2023)   No facility-administered encounter medications on file as of 11/25/2023.    Review of Systems Negative unless indicated in HPI.  There were no vitals filed for this visit. There is no height or weight on file to calculate BMI. BP Readings from Last 3 Encounters:  11/10/23 (!) 119/53  11/09/23 127/60  11/03/23 (!) 107/57   Wt Readings from Last 3 Encounters:  11/10/23 189 lb 8 oz (86 kg)  11/09/23 189 lb 8 oz (86 kg)  11/03/23 197 lb 8 oz (89.6 kg)   Physical Exam Constitutional:      Appearance: Normal appearance.  Cardiovascular:     Rate and Rhythm: Normal rate. Rhythm irregular.     Pulses: Normal pulses.  Pulmonary:     Effort: No respiratory distress.     Breath sounds: No stridor. No wheezing or rales.  Abdominal:     General: Bowel sounds are normal. There is no distension.     Palpations: Abdomen is soft.     Tenderness: There is no abdominal tenderness. There is no guarding.  Musculoskeletal:        General: Swelling present.     Comments: Chronic, wearing compression stockings  Skin:    Comments: 0.5 cm shallow open wound on left cheek No surrounding erythema No exudates  Neurological:     Mental Status: He is alert. Mental  status is at baseline.     Motor: No weakness.     Labs reviewed: Basic Metabolic Panel: Recent Labs    07/15/23 0000 08/10/23 0000 08/17/23 0000  NA 139 131* 138  K 4.3 4.5 4.7  CL 103 100 104  CO2 27* 25* 28*  BUN 24* 19 17  CREATININE 1.1 1.2 1.2  CALCIUM 10.2 10.3 10.5   Liver Function Tests: Recent Labs    05/25/23 0000 07/15/23 0000 08/10/23 0000  AST 19 20 19   ALT 13 10 11   ALKPHOS 84 82 75  ALBUMIN 3.8 3.7 3.8   No results for input(s): "LIPASE", "AMYLASE" in the last 8760 hours. No results for input(s): "AMMONIA" in the last 8760 hours. CBC: Recent Labs    05/25/23 0000 07/15/23 0000 08/10/23 0000  WBC 10.6 9.1 9.3  NEUTROABS 7,590.00 8,199.00 6,547.00  HGB 11.9* 11.1* 10.5*  HCT 36* 34* 32*  PLT 322 296 347   Cardiac Enzymes: No results for input(s): "CKTOTAL", "CKMB", "CKMBINDEX", "TROPONINI" in the last 8760 hours. BNP: Invalid input(s): "POCBNP" Lab Results  Component Value Date   HGBA1C  08/24/2009    5.7 (NOTE) The ADA recommends the following therapeutic goal for glycemic control related to Hgb A1c measurement: Goal of therapy: <6.5 Hgb A1c  Reference: American Diabetes Association: Clinical Practice Recommendations 2010, Diabetes Care, 2010, 33: (Suppl  1).   Lab Results  Component Value Date   TSH 1.42 02/09/2023   Lab Results  Component Value Date   VITAMINB12 278 07/02/2022   No results found for: "FOLATE" No results found for: "IRON", "TIBC", "FERRITIN"  Imaging and Procedures obtained prior to SNF admission: CT Head Wo Contrast Result Date: 07/01/2022 CLINICAL DATA:  Head and neck trauma. EXAM: CT HEAD WITHOUT CONTRAST CT CERVICAL SPINE WITHOUT CONTRAST TECHNIQUE: Multidetector CT imaging of the head and cervical spine was performed following the standard protocol without intravenous contrast.  Multiplanar CT image reconstructions of the cervical spine were also generated. RADIATION DOSE REDUCTION: This exam was performed  according to the departmental dose-optimization program which includes automated exposure control, adjustment of the mA and/or kV according to patient size and/or use of iterative reconstruction technique. COMPARISON:  CT examination dated February 16, 2018 FINDINGS: CT HEAD FINDINGS Brain: No evidence of acute infarction, hemorrhage, extra-axial collection or mass lesion/mass effect. Prominence of the ventricles and sulci secondary to moderate cerebral volume loss. Diffuse low-attenuation of the periventricular white matter presumed advanced chronic microvascular ischemic changes of the white matter. Prominence of the ventricles disproportionate to the volume loss concerning for normal pressure hydrocephalus. Vascular: No hyperdense vessel or unexpected calcification. Skull: Normal. Negative for fracture or focal lesion. Sinuses/Orbits: No acute finding. Other: None. CT CERVICAL SPINE FINDINGS Alignment: Straightening of the cervical spine. Skull base and vertebrae: No acute fracture. No primary bone lesion or focal pathologic process. Soft tissues and spinal canal: No prevertebral fluid or swelling. No visible canal hematoma. Disc levels: Multilevel degenerate disc disease with disc height loss and marginal osteophytes with associated uncovertebral joint and facet joint arthropathy. C2-C3:  No significant findings. C3-C4: Disc height loss and uncovertebral joint arthropathy with mild right and moderate left neural foraminal stenosis. Mild left facet joint arthropathy. C4-C5: Disc height loss and marked uncovertebral joint arthropathy with moderate-to-severe bilateral neural foraminal stenosis. C5-C6: Disc height loss and uncovertebral joint arthropathy with moderate right and severe left neural foraminal stenosis. C6-C7: Disc height loss and uncovertebral joint arthropathy with mild left neural foraminal stenosis. C7-T1: Disc height loss and facet joint arthropathy. No significant neural foraminal stenosis. Upper  chest: Negative. Other: None IMPRESSION: CT HEAD: 1. No acute intracranial abnormality. 2. Moderate cerebral volume loss and advanced chronic microvascular ischemic changes of the white matter. 3. Prominence of the ventricles more than expected for the cerebral volume loss concerning for normal pressure hydrocephalus. CT CERVICAL SPINE: 1. No acute fracture or traumatic subluxation. 2. Multilevel degenerate disc disease with associated uncovertebral joint and facet joint arthropathy. Electronically Signed   By: Imran  Ahmed D.O.   On: 07/01/2022 23:25   CT Cervical Spine Wo Contrast Result Date: 07/01/2022 CLINICAL DATA:  Head and neck trauma. EXAM: CT HEAD WITHOUT CONTRAST CT CERVICAL SPINE WITHOUT CONTRAST TECHNIQUE: Multidetector CT imaging of the head and cervical spine was performed following the standard protocol without intravenous contrast. Multiplanar CT image reconstructions of the cervical spine were also generated. RADIATION DOSE REDUCTION: This exam was performed according to the departmental dose-optimization program which includes automated exposure control, adjustment of the mA and/or kV according to patient size and/or use of iterative reconstruction technique. COMPARISON:  CT examination dated February 16, 2018 FINDINGS: CT HEAD FINDINGS Brain: No evidence of acute infarction, hemorrhage, extra-axial collection or mass lesion/mass effect. Prominence of the ventricles and sulci secondary to moderate cerebral volume loss. Diffuse low-attenuation of the periventricular white matter presumed advanced chronic microvascular ischemic changes of the white matter. Prominence of the ventricles disproportionate to the volume loss concerning for normal pressure hydrocephalus. Vascular: No hyperdense vessel or unexpected calcification. Skull: Normal. Negative for fracture or focal lesion. Sinuses/Orbits: No acute finding. Other: None. CT CERVICAL SPINE FINDINGS Alignment: Straightening of the cervical spine.  Skull base and vertebrae: No acute fracture. No primary bone lesion or focal pathologic process. Soft tissues and spinal canal: No prevertebral fluid or swelling. No visible canal hematoma. Disc levels: Multilevel degenerate disc disease with disc height loss and marginal osteophytes with associated  uncovertebral joint and facet joint arthropathy. C2-C3:  No significant findings. C3-C4: Disc height loss and uncovertebral joint arthropathy with mild right and moderate left neural foraminal stenosis. Mild left facet joint arthropathy. C4-C5: Disc height loss and marked uncovertebral joint arthropathy with moderate-to-severe bilateral neural foraminal stenosis. C5-C6: Disc height loss and uncovertebral joint arthropathy with moderate right and severe left neural foraminal stenosis. C6-C7: Disc height loss and uncovertebral joint arthropathy with mild left neural foraminal stenosis. C7-T1: Disc height loss and facet joint arthropathy. No significant neural foraminal stenosis. Upper chest: Negative. Other: None IMPRESSION: CT HEAD: 1. No acute intracranial abnormality. 2. Moderate cerebral volume loss and advanced chronic microvascular ischemic changes of the white matter. 3. Prominence of the ventricles more than expected for the cerebral volume loss concerning for normal pressure hydrocephalus. CT CERVICAL SPINE: 1. No acute fracture or traumatic subluxation. 2. Multilevel degenerate disc disease with associated uncovertebral joint and facet joint arthropathy. Electronically Signed   By: Imran  Ahmed D.O.   On: 07/01/2022 23:25   DG Chest 1 View Result Date: 07/01/2022 CLINICAL DATA:  Altered mental status. EXAM: CHEST  1 VIEW COMPARISON:  Chest x-ray 09/06/2013 FINDINGS: The heart and mediastinal contours are unchanged. Aortic calcification. No focal consolidation. No pulmonary edema. No pleural effusion. No pneumothorax. No acute osseous abnormality. Severe degenerative changes of the bilateral shoulders.  IMPRESSION: No active disease. Electronically Signed   By: Morgane  Naveau M.D.   On: 07/01/2022 22:44    Assessment and Plan Assessment & Plan  Left cheek open wound No signs of infection Continue with Neosporin Continue with the daily dressing change If no improvement will refer to the dermatology  Major neurocognitive disorder No reports of behavioral changes as per nursing staff Continue with Aricept   History of bipolar disorder Follow-up with psychiatry Continue with the lithium    Osteoarthritis of multiple sites Stable Continue with Norco  A-fib No signs of bleeding Continue with Eliquis .    30 min Total time spent for obtaining history,  performing a medically appropriate examination and evaluation, reviewing the tests,   documenting clinical information in the electronic or other health record,  ,care coordination (not separately reported)

## 2023-11-29 ENCOUNTER — Encounter: Payer: Self-pay | Admitting: Sports Medicine

## 2023-11-29 DIAGNOSIS — M6281 Muscle weakness (generalized): Secondary | ICD-10-CM | POA: Diagnosis not present

## 2023-11-29 DIAGNOSIS — R29898 Other symptoms and signs involving the musculoskeletal system: Secondary | ICD-10-CM | POA: Diagnosis not present

## 2023-11-29 DIAGNOSIS — M62521 Muscle wasting and atrophy, not elsewhere classified, right upper arm: Secondary | ICD-10-CM | POA: Diagnosis not present

## 2023-11-30 DIAGNOSIS — M6281 Muscle weakness (generalized): Secondary | ICD-10-CM | POA: Diagnosis not present

## 2023-11-30 DIAGNOSIS — R29898 Other symptoms and signs involving the musculoskeletal system: Secondary | ICD-10-CM | POA: Diagnosis not present

## 2023-11-30 DIAGNOSIS — M62521 Muscle wasting and atrophy, not elsewhere classified, right upper arm: Secondary | ICD-10-CM | POA: Diagnosis not present

## 2023-12-02 DIAGNOSIS — M62521 Muscle wasting and atrophy, not elsewhere classified, right upper arm: Secondary | ICD-10-CM | POA: Diagnosis not present

## 2023-12-02 DIAGNOSIS — R29898 Other symptoms and signs involving the musculoskeletal system: Secondary | ICD-10-CM | POA: Diagnosis not present

## 2023-12-02 DIAGNOSIS — M6281 Muscle weakness (generalized): Secondary | ICD-10-CM | POA: Diagnosis not present

## 2023-12-07 ENCOUNTER — Non-Acute Institutional Stay (SKILLED_NURSING_FACILITY): Payer: Self-pay | Admitting: Nurse Practitioner

## 2023-12-07 ENCOUNTER — Encounter: Payer: Self-pay | Admitting: Nurse Practitioner

## 2023-12-07 DIAGNOSIS — R001 Bradycardia, unspecified: Secondary | ICD-10-CM

## 2023-12-07 DIAGNOSIS — R29898 Other symptoms and signs involving the musculoskeletal system: Secondary | ICD-10-CM | POA: Diagnosis not present

## 2023-12-07 DIAGNOSIS — M62521 Muscle wasting and atrophy, not elsewhere classified, right upper arm: Secondary | ICD-10-CM | POA: Diagnosis not present

## 2023-12-07 DIAGNOSIS — M150 Primary generalized (osteo)arthritis: Secondary | ICD-10-CM

## 2023-12-07 DIAGNOSIS — I4819 Other persistent atrial fibrillation: Secondary | ICD-10-CM

## 2023-12-07 DIAGNOSIS — E291 Testicular hypofunction: Secondary | ICD-10-CM

## 2023-12-07 DIAGNOSIS — N1831 Chronic kidney disease, stage 3a: Secondary | ICD-10-CM

## 2023-12-07 DIAGNOSIS — D751 Secondary polycythemia: Secondary | ICD-10-CM | POA: Diagnosis not present

## 2023-12-07 DIAGNOSIS — R350 Frequency of micturition: Secondary | ICD-10-CM | POA: Diagnosis not present

## 2023-12-07 DIAGNOSIS — M6281 Muscle weakness (generalized): Secondary | ICD-10-CM | POA: Diagnosis not present

## 2023-12-07 DIAGNOSIS — F319 Bipolar disorder, unspecified: Secondary | ICD-10-CM

## 2023-12-07 DIAGNOSIS — F0390 Unspecified dementia without behavioral disturbance: Secondary | ICD-10-CM

## 2023-12-07 DIAGNOSIS — F5101 Primary insomnia: Secondary | ICD-10-CM

## 2023-12-07 DIAGNOSIS — R251 Tremor, unspecified: Secondary | ICD-10-CM

## 2023-12-07 DIAGNOSIS — I5042 Chronic combined systolic (congestive) and diastolic (congestive) heart failure: Secondary | ICD-10-CM

## 2023-12-07 NOTE — Assessment & Plan Note (Signed)
 off Metoprolol , Diltiazem  2/2 to bradycardia. 08/24/23 38/60 sec with V tach, stopped Metoprolol  per cardiology

## 2023-12-07 NOTE — Assessment & Plan Note (Signed)
 bilirubin 0.6 07/15/23<<1.3 02/09/23

## 2023-12-07 NOTE — Assessment & Plan Note (Signed)
Heart rate is in control, taking Eliquis

## 2023-12-07 NOTE — Assessment & Plan Note (Signed)
 Bun/creat 17/1.15 08/17/23

## 2023-12-07 NOTE — Assessment & Plan Note (Signed)
followed by psychiatry, on Lithium, TSH 1.42 02/09/23, 07/16/22 Lithium level 0.6(0.6-1.2)

## 2023-12-07 NOTE — Assessment & Plan Note (Signed)
stopped parkinson's medication because of affected his cognition, had resting tremor in left arm/leg in the past

## 2023-12-07 NOTE — Assessment & Plan Note (Signed)
 SNF [facility] for supportive care, 07/16/22 MMSE 18/30, taking Deonepezil sine 8/21/2 by Merilyn Baba

## 2023-12-07 NOTE — Assessment & Plan Note (Signed)
 hx of  testosterone  inj, dc'd by urology 11/18/23

## 2023-12-07 NOTE — Assessment & Plan Note (Signed)
 taking Tamsulosin , off Myrbetriq

## 2023-12-07 NOTE — Progress Notes (Unsigned)
 Location:  [facility] Room Number: N018-A Place of Service:  SNF (31) Provider:  Jerret Mcbane X, NP  Pcp, No  Patient Care Team: Pcp, No as PCP - General Manfred Seed, MD as PCP - Cardiology (Cardiology)  Extended Emergency Contact Information Primary Emergency Contact: Sankey,[name] Address: [phone]           [facility] [address]          [place], [place] [address] [place] Home Phone: [phone] Mobile Phone: [phone] Relation: Spouse Secondary Emergency Contact: Kurowski,Andy Address: [address]          [place], [place] [address] [place] Home Phone: [phone] Mobile Phone: [phone] Relation: Son  Code Status:  DNR Goals of care: Advanced Directive information    12/07/2023    9:11 AM  Advanced Directives  Does Patient Have a Medical Advance Directive? Yes  Type of Advance Directive Out of facility DNR (pink MOST or yellow form)  Does patient want to make changes to medical advance directive? No - Patient declined  Copy of Healthcare Power of Attorney in Chart? No - copy requested  Pre-existing out of facility DNR order (yellow form or pink MOST form) Pink MOST/Yellow Form most recent copy in chart - Physician notified to receive inpatient order     No chief complaint on file.   HPI:  Pt is a 89 y.o. male seen today for medical management of chronic diseases.   Urinary frequency, taking Tamsulosin , off Myrbetriq by urology 11/18/23              HOH L>R, HPOA-wife declined hearing aids.              Parkinson's disease, stopped parkinson's medication because of affected his cognition, had resting tremor in left arm/leg in the past             OP 12/01/22 dc'd Alendronate tscore -2.9 2017, -0.9 01/19/19, refused DEXA 11/02/22             OA shoulders pain, worsens at night, limited abduction ROM over shoulder level, hx of neck clicks with ROM, aches, c/o right sciatica pain, worsens  in am, chronic left knee pain,  Norco and Gabapentin for pain control.              Cognitive impairment, SNF [facility] for supportive care, 07/16/22 MMSE 18/30, taking Deonepezil sine 8/21/2 by Psych             CVA, no focal weakness residual.              Afib, taking  Eliquis              Bradycardia, off Metoprolol , Diltiazem  2/2 to bradycardia. 08/24/23 38/60 sec with V tach, stopped Metoprolol  per cardiology             Bipolar disorder, followed by psychiatry, on Lithium , TSH 1.42 02/09/23, 07/16/22 Lithium  level 0.6(0.6-1.2)             Insomnia, takes Trazodone  since 02/17/23 by psych             CHF, EF 45-50% 11/2021, taking Furosemide, trace edema BLE, 05/10/23 CXR-mild pulmonary venous congestion, no infiltrate, followed by cardiology.              Gait abnormality, uses w/c for mobility              Hyponatremia, Na 138 08/17/23  Hyperbilirubinemia, bilirubin 0.6 07/15/23            Polycythemia,  Hgb 11.1 07/15/23             CKD, Bun/creat 17/1.15 08/17/23             Hypogonadism, hx of  testosterone  inj, dc'd by urology 11/18/23             Hyperlipidemia, takes Pravastatin              Glaucoma, eye drops.     Past Medical History:  Diagnosis Date  . AF (paroxysmal atrial fibrillation) (HCC) 02/16/2018  . Atrial fibrillation (HCC)   . Atrial fibrillation with RVR (HCC) 02/16/2018  . Bipolar 1 disorder (HCC)   . Bipolar disorder (HCC) 02/17/2018  . Dizziness 02/26/2020  . DJD (degenerative joint disease)   . Dyslipidemia 02/26/2020  . Gait instability 02/16/2018  . Hypertension   . Mitral regurgitation    mild   . Polycythemia 02/16/2018  . Renal insufficiency   . Stroke (HCC)   . Tremor 02/16/2018  . Tremors of nervous system 01/2018   Past Surgical History:  Procedure Laterality Date  . SHOULDER SURGERY      Allergies  Allergen Reactions  . Lipitor [Atorvastatin] Other (See Comments)    Joint soreness    Outpatient Encounter Medications as of 12/07/2023   Medication Sig  . ascorbic acid  (VITAMIN C ) 500 MG tablet Take 500 mg by mouth daily.  . Calcium Carbonate-Vit D-Min (CALCIUM 600+D PLUS MINERALS) 600-400 MG-UNIT CHEW Chew 1 Piece by mouth in the morning and at bedtime.  . docusate sodium  (COLACE) 100 MG capsule Take 1 capsule (100 mg total) by mouth 2 (two) times daily.  . donepezil  (ARICEPT ) 10 MG tablet Take 1 tablet (10 mg total) by mouth at bedtime.  . ELIQUIS  5 MG TABS tablet TAKE 1 TABLET BY MOUTH TWICE DAILY.  . furosemide (LASIX) 20 MG tablet Take 20 mg by mouth daily.  Aaron Aas gabapentin (NEURONTIN) 100 MG capsule Take 200 mg by mouth at bedtime.  . HYDROcodone -acetaminophen  (NORCO/VICODIN) 5-325 MG tablet Take 0.5 tablets by mouth in the morning and at bedtime.  . latanoprost  (XALATAN ) 0.005 % ophthalmic solution Place 1 drop into both eyes at bedtime.  . lithium  carbonate (LITHOBID ) 300 MG ER tablet Take 1 tablet (300 mg total) by mouth every evening.  . Multiple Vitamin (MULTIVITAMIN WITH MINERALS) TABS tablet Take 1 tablet by mouth daily.  . Polyethyl Glycol-Propyl Glycol (SYSTANE) 0.4-0.3 % SOLN Apply 1 drop to eye every 4 (four) hours as needed (dry eyes). And four times a day scheduled  . potassium chloride (MICRO-K) 10 MEQ CR capsule Take 10 mEq by mouth daily.  . pravastatin  (PRAVACHOL ) 20 MG tablet Take 20 mg by mouth at bedtime.  . sodium chloride  (MURO 128) 5 % ophthalmic solution Place 1 drop into both eyes at bedtime.  . tamsulosin  (FLOMAX ) 0.4 MG CAPS capsule Take 1 capsule (0.4 mg total) by mouth daily.  . timolol  (BETIMOL ) 0.5 % ophthalmic solution Place 1 drop into both eyes daily.  . traZODone  (DESYREL ) 50 MG tablet Take 1 tablet (50 mg total) by mouth at bedtime.  . [DISCONTINUED] mirabegron ER (MYRBETRIQ) 25 MG TB24 tablet Take 25 mg by mouth daily. (Patient not taking: Reported on 12/07/2023)  . [DISCONTINUED] triamcinolone  cream (KENALOG ) 0.1 % Apply 1 Application topically 2 (two) times daily. (Patient not taking:  Reported on 12/07/2023)   No facility-administered encounter medications on file as  of 12/07/2023.    Review of Systems  Constitutional:  Negative for appetite change, fatigue and fever.  HENT:  Positive for hearing loss. Negative for congestion and trouble swallowing.   Eyes:  Negative for visual disturbance.  Respiratory:  Negative for cough.   Cardiovascular:  Positive for leg swelling.  Gastrointestinal:  Negative for abdominal pain and constipation.  Genitourinary:  Negative for dysuria and urgency.       Incontinent of urine.   Musculoskeletal:  Positive for arthralgias, back pain and gait problem.       Shoulders pain with above shoulder level abduction ROM, worsens at night. R sciatic pain, not new, worse in am when getting up, chronic left knee pain-worsened pain now Worsened left neck/shoulder pain /tingling, numbness arm, no decreased muscle strength.   Skin:  Negative for color change.  Neurological:  Negative for speech difficulty, weakness and headaches.  Psychiatric/Behavioral:  Positive for confusion and sleep disturbance. The patient is not nervous/anxious.        Early am awake, chronic    Immunization History  Administered Date(s) Administered  . Hepatitis B, ADULT 11/26/1997, 12/27/1997, 05/08/1998  . Influenza, High Dose Seasonal PF 03/31/2023  . Influenza-Unspecified 04/15/2022  . Moderna Covid-19 Vaccine Bivalent Booster 71yrs & up 04/14/2023  . PPD Test 07/08/2022  . Pneumococcal Conjugate-13 04/26/2014  . Pneumococcal Polysaccharide-23 04/14/2005  . RSV,unspecified 08/19/2022  . Td 08/04/2022  . Tdap 05/18/2016  . Zoster Recombinant(Shingrix) 07/29/2022, 10/28/2022   Pertinent  Health Maintenance Due  Topic Date Due  . INFLUENZA VACCINE  01/28/2024      07/08/2022    9:22 AM 07/10/2022   11:13 AM 08/03/2022    4:30 PM 08/25/2022   11:49 AM 09/24/2022    1:55 PM  Fall Risk  Falls in the past year?  1 1 1  0  Was there an injury with Fall?  1 1 0 0   Fall Risk Category Calculator  3 3 1  0  Fall Risk Category (Retired)  High     (RETIRED) Patient Fall Risk Level High fall risk High fall risk     Patient at Risk for Falls Due to  History of fall(s);Impaired balance/gait;Impaired mobility History of fall(s);Impaired balance/gait;Impaired mobility No Fall Risks   Fall risk Follow up  Falls evaluation completed Falls evaluation completed Falls evaluation completed    Functional Status Survey:    Vitals:   12/07/23 0850  BP: 104/68  Pulse: 70  Resp: 18  Temp: 98 F (36.7 C)  SpO2: 96%  Weight: 201 lb 6.4 oz (91.4 kg)  Height: 5' 6 (1.676 m)   Body mass index is 32.51 kg/m. Physical Exam Vitals and nursing note reviewed.  Constitutional:      Appearance: Normal appearance.  HENT:     Head: Normocephalic and atraumatic.     Nose: Nose normal.     Mouth/Throat:     Mouth: Mucous membranes are moist.  Eyes:     Extraocular Movements: Extraocular movements intact.     Conjunctiva/sclera: Conjunctivae normal.     Pupils: Pupils are equal, round, and reactive to light.  Cardiovascular:     Rate and Rhythm: Normal rate. Rhythm irregular.     Heart sounds: No murmur heard. Pulmonary:     Effort: Pulmonary effort is normal.     Breath sounds: No rales.  Abdominal:     General: Bowel sounds are normal.     Palpations: Abdomen is soft.     Tenderness: There  is no abdominal tenderness.  Musculoskeletal:        General: Tenderness present. No swelling, deformity or signs of injury.     Cervical back: Normal range of motion and neck supple.     Right lower leg: Edema present.     Left lower leg: Edema present.     Comments: trace edema BLE.  Shoulders pain with above shoulder level abduction ROM, worsens at night, R sciatica pain worse in am/getting out of bed, chronic left knee pain  Skin:    General: Skin is warm and dry.     Comments: The right 5th toe nail yellow thick from previous examination   Neurological:      General: No focal deficit present.     Mental Status: He is alert and oriented to person, place, and time. Mental status is at baseline.     Motor: No weakness.     Gait: Gait abnormal.  Psychiatric:        Mood and Affect: Mood normal.        Behavior: Behavior normal.        Thought Content: Thought content normal.    Labs reviewed: Recent Labs    07/15/23 0000 08/10/23 0000 08/17/23 0000  NA 139 131* 138  K 4.3 4.5 4.7  CL 103 100 104  CO2 27* 25* 28*  BUN 24* 19 17  CREATININE 1.1 1.2 1.2  CALCIUM 10.2 10.3 10.5   Recent Labs    05/25/23 0000 07/15/23 0000 08/10/23 0000  AST 19 20 19   ALT 13 10 11   ALKPHOS 84 82 75  ALBUMIN 3.8 3.7 3.8   Recent Labs    05/25/23 0000 07/15/23 0000 08/10/23 0000  WBC 10.6 9.1 9.3  NEUTROABS 7,590.00 8,199.00 6,547.00  HGB 11.9* 11.1* 10.5*  HCT 36* 34* 32*  PLT 322 296 347   Lab Results  Component Value Date   TSH 1.42 02/09/2023   Lab Results  Component Value Date   HGBA1C  08/24/2009    5.7 (NOTE) The ADA recommends the following therapeutic goal for glycemic control related to Hgb A1c measurement: Goal of therapy: <6.5 Hgb A1c  Reference: American Diabetes Association: Clinical Practice Recommendations 2010, Diabetes Care, 2010, 33: (Suppl  1).   Lab Results  Component Value Date   CHOL 116 12/03/2022   HDL 46 12/03/2022   LDLCALC 54 12/03/2022   LDLDIRECT 55 09/02/2020   TRIG 80 12/03/2022   CHOLHDL 3.3 09/02/2020    Significant Diagnostic Results in last 30 days:  No results found.  Assessment/Plan Urinary frequency taking Tamsulosin , off Myrbetriq  Hypogonadism in male hx of  testosterone  inj, dc'd by urology 11/18/23  CKD (chronic kidney disease) stage 3, GFR 30-59 ml/min (HCC) Bun/creat 17/1.15 08/17/23  Polycythemia Hgb 11.1 07/15/23  Hyperbilirubinemia  bilirubin 0.6 07/15/23<<1.3 02/09/23  Chronic CHF (HCC)  EF 45-50% 11/2021, taking Furosemide, trace edema BLE, 05/10/23 CXR-mild pulmonary  venous congestion, no infiltrate  Insomnia Stable, takes Trazodone  since 02/17/23 by psych  Bipolar disorder [facility]) followed by psychiatry, on Lithium , TSH 1.42 02/09/23, 07/16/22 Lithium  level 0.6(0.6-1.2)  Bradycardia off Metoprolol , Diltiazem  2/2 to bradycardia. 08/24/23 38/60 sec with V tach, stopped Metoprolol  per cardiology  Atrial fibrillation, persistent (HCC) Heart rate is in control, taking  Eliquis   Major neurocognitive disorder (HCC)  SNF [facility] for supportive care, 07/16/22 MMSE 18/30, taking Deonepezil sine 8/21/2 by Psych  Osteoarthritis, multiple sites  shoulders pain, worsens at night, limited abduction ROM over shoulder level, hx  of neck clicks with ROM, aches, c/o right sciatica pain, worsens in am, chronic left knee pain,  Norco and Gabapentin for pain control.   Tremors of nervous system  stopped parkinson's medication because of affected his cognition, had resting tremor in left arm/leg in the past     Family/ staff Communication: plan of care reviewed with the patient and charge nurse   Labs/tests ordered:  none

## 2023-12-07 NOTE — Assessment & Plan Note (Signed)
 EF 45-50% 11/2021, taking Furosemide, trace edema BLE, 05/10/23 CXR-mild pulmonary venous congestion, no infiltrate

## 2023-12-07 NOTE — Assessment & Plan Note (Signed)
 Hgb 11.1 07/15/23

## 2023-12-07 NOTE — Assessment & Plan Note (Signed)
 Stable,  takes Trazodone since 02/17/23 by psych

## 2023-12-07 NOTE — Assessment & Plan Note (Signed)
 shoulders pain, worsens at night, limited abduction ROM over shoulder level, hx of neck clicks with ROM, aches, c/o right sciatica pain, worsens in am, chronic left knee pain,  Norco and Gabapentin for pain control.

## 2023-12-09 DIAGNOSIS — M6281 Muscle weakness (generalized): Secondary | ICD-10-CM | POA: Diagnosis not present

## 2023-12-09 DIAGNOSIS — R29898 Other symptoms and signs involving the musculoskeletal system: Secondary | ICD-10-CM | POA: Diagnosis not present

## 2023-12-09 DIAGNOSIS — M62521 Muscle wasting and atrophy, not elsewhere classified, right upper arm: Secondary | ICD-10-CM | POA: Diagnosis not present

## 2023-12-10 DIAGNOSIS — R29898 Other symptoms and signs involving the musculoskeletal system: Secondary | ICD-10-CM | POA: Diagnosis not present

## 2023-12-10 DIAGNOSIS — M62521 Muscle wasting and atrophy, not elsewhere classified, right upper arm: Secondary | ICD-10-CM | POA: Diagnosis not present

## 2023-12-10 DIAGNOSIS — M6281 Muscle weakness (generalized): Secondary | ICD-10-CM | POA: Diagnosis not present

## 2023-12-15 DIAGNOSIS — M62521 Muscle wasting and atrophy, not elsewhere classified, right upper arm: Secondary | ICD-10-CM | POA: Diagnosis not present

## 2023-12-15 DIAGNOSIS — M6281 Muscle weakness (generalized): Secondary | ICD-10-CM | POA: Diagnosis not present

## 2023-12-15 DIAGNOSIS — R29898 Other symptoms and signs involving the musculoskeletal system: Secondary | ICD-10-CM | POA: Diagnosis not present

## 2023-12-16 DIAGNOSIS — R29898 Other symptoms and signs involving the musculoskeletal system: Secondary | ICD-10-CM | POA: Diagnosis not present

## 2023-12-16 DIAGNOSIS — M62521 Muscle wasting and atrophy, not elsewhere classified, right upper arm: Secondary | ICD-10-CM | POA: Diagnosis not present

## 2023-12-16 DIAGNOSIS — M6281 Muscle weakness (generalized): Secondary | ICD-10-CM | POA: Diagnosis not present

## 2023-12-17 DIAGNOSIS — R29898 Other symptoms and signs involving the musculoskeletal system: Secondary | ICD-10-CM | POA: Diagnosis not present

## 2023-12-17 DIAGNOSIS — M6281 Muscle weakness (generalized): Secondary | ICD-10-CM | POA: Diagnosis not present

## 2023-12-17 DIAGNOSIS — M62521 Muscle wasting and atrophy, not elsewhere classified, right upper arm: Secondary | ICD-10-CM | POA: Diagnosis not present

## 2023-12-21 DIAGNOSIS — M6281 Muscle weakness (generalized): Secondary | ICD-10-CM | POA: Diagnosis not present

## 2023-12-21 DIAGNOSIS — R29898 Other symptoms and signs involving the musculoskeletal system: Secondary | ICD-10-CM | POA: Diagnosis not present

## 2023-12-21 DIAGNOSIS — M62521 Muscle wasting and atrophy, not elsewhere classified, right upper arm: Secondary | ICD-10-CM | POA: Diagnosis not present

## 2023-12-22 DIAGNOSIS — M62521 Muscle wasting and atrophy, not elsewhere classified, right upper arm: Secondary | ICD-10-CM | POA: Diagnosis not present

## 2023-12-22 DIAGNOSIS — R29898 Other symptoms and signs involving the musculoskeletal system: Secondary | ICD-10-CM | POA: Diagnosis not present

## 2023-12-22 DIAGNOSIS — M6281 Muscle weakness (generalized): Secondary | ICD-10-CM | POA: Diagnosis not present

## 2023-12-24 DIAGNOSIS — M6281 Muscle weakness (generalized): Secondary | ICD-10-CM | POA: Diagnosis not present

## 2023-12-24 DIAGNOSIS — R29898 Other symptoms and signs involving the musculoskeletal system: Secondary | ICD-10-CM | POA: Diagnosis not present

## 2023-12-24 DIAGNOSIS — M62521 Muscle wasting and atrophy, not elsewhere classified, right upper arm: Secondary | ICD-10-CM | POA: Diagnosis not present

## 2023-12-27 DIAGNOSIS — R29898 Other symptoms and signs involving the musculoskeletal system: Secondary | ICD-10-CM | POA: Diagnosis not present

## 2023-12-27 DIAGNOSIS — M62521 Muscle wasting and atrophy, not elsewhere classified, right upper arm: Secondary | ICD-10-CM | POA: Diagnosis not present

## 2023-12-27 DIAGNOSIS — M6281 Muscle weakness (generalized): Secondary | ICD-10-CM | POA: Diagnosis not present

## 2023-12-28 DIAGNOSIS — M6281 Muscle weakness (generalized): Secondary | ICD-10-CM | POA: Diagnosis not present

## 2023-12-28 DIAGNOSIS — R29898 Other symptoms and signs involving the musculoskeletal system: Secondary | ICD-10-CM | POA: Diagnosis not present

## 2023-12-28 DIAGNOSIS — M62521 Muscle wasting and atrophy, not elsewhere classified, right upper arm: Secondary | ICD-10-CM | POA: Diagnosis not present

## 2023-12-30 DIAGNOSIS — R29898 Other symptoms and signs involving the musculoskeletal system: Secondary | ICD-10-CM | POA: Diagnosis not present

## 2023-12-30 DIAGNOSIS — M62521 Muscle wasting and atrophy, not elsewhere classified, right upper arm: Secondary | ICD-10-CM | POA: Diagnosis not present

## 2023-12-30 DIAGNOSIS — M6281 Muscle weakness (generalized): Secondary | ICD-10-CM | POA: Diagnosis not present

## 2024-01-03 DIAGNOSIS — M6281 Muscle weakness (generalized): Secondary | ICD-10-CM | POA: Diagnosis not present

## 2024-01-03 DIAGNOSIS — M62521 Muscle wasting and atrophy, not elsewhere classified, right upper arm: Secondary | ICD-10-CM | POA: Diagnosis not present

## 2024-01-03 DIAGNOSIS — R29898 Other symptoms and signs involving the musculoskeletal system: Secondary | ICD-10-CM | POA: Diagnosis not present

## 2024-01-05 DIAGNOSIS — M6281 Muscle weakness (generalized): Secondary | ICD-10-CM | POA: Diagnosis not present

## 2024-01-05 DIAGNOSIS — R29898 Other symptoms and signs involving the musculoskeletal system: Secondary | ICD-10-CM | POA: Diagnosis not present

## 2024-01-05 DIAGNOSIS — M62521 Muscle wasting and atrophy, not elsewhere classified, right upper arm: Secondary | ICD-10-CM | POA: Diagnosis not present

## 2024-01-06 ENCOUNTER — Non-Acute Institutional Stay (SKILLED_NURSING_FACILITY): Payer: Self-pay | Admitting: Sports Medicine

## 2024-01-06 ENCOUNTER — Encounter: Payer: Self-pay | Admitting: Sports Medicine

## 2024-01-06 DIAGNOSIS — M150 Primary generalized (osteo)arthritis: Secondary | ICD-10-CM

## 2024-01-06 DIAGNOSIS — F319 Bipolar disorder, unspecified: Secondary | ICD-10-CM

## 2024-01-06 DIAGNOSIS — F5101 Primary insomnia: Secondary | ICD-10-CM

## 2024-01-06 DIAGNOSIS — I509 Heart failure, unspecified: Secondary | ICD-10-CM | POA: Diagnosis not present

## 2024-01-06 DIAGNOSIS — F0390 Unspecified dementia without behavioral disturbance: Secondary | ICD-10-CM | POA: Diagnosis not present

## 2024-01-06 DIAGNOSIS — N401 Enlarged prostate with lower urinary tract symptoms: Secondary | ICD-10-CM

## 2024-01-06 NOTE — Progress Notes (Signed)
 Location:   [facility] Room Number: [id number]-A Place of Service:  SNF (31) Provider:  Dr.Taylie Helder Sherlynn JOHNS PCP  Patient Care Team: Pcp, No as PCP - General Bernie Lamar PARAS, MD as PCP - Cardiology (Cardiology)  Extended Emergency Contact Information Primary Emergency Contact: Monie,[name] Address: [phone]           [facility] [address]          [place], [place] [address] [place] Home Phone: [phone] Mobile Phone: [phone] Relation: Spouse Secondary Emergency Contact: Tailor,Andy Address: [address]          [place], [place] [address] [place] Home Phone: [phone] Mobile Phone: [phone] Relation: Son  Code Status:  DNR Goals of care: Advanced Directive information    01/06/2024   10:20 AM  Advanced Directives  Does Patient Have a Medical Advance Directive? Yes  Type of Advance Directive Out of facility DNR (pink MOST or yellow form);Healthcare Power of Attorney  Does patient want to make changes to medical advance directive? No - Patient declined  Copy of Healthcare Power of Attorney in Chart? No - copy requested     Chief Complaint  Patient presents with   Medical Management of Chronic Issues    Routine visit.    HPI:  Pt is a 89 y.o. male with past medical history of Parkinson's disease, osteoporosis osteoarthritis, CVA, cognitive impairment, bipolar disorder, insomnia is seen today for medical management of chronic diseases.    Patient seen and examined in his room.  He is sitting in his wheelchair.  Seems pleasant and comfortable and does not appear to be in distress. Patient complains of chronic pain in his right shoulder and left knee.  No recent falls.  He follows with physical therapy twice a week. Patient denies chest pain, shortness of breath, abdominal pain, nausea, vomiting, dysuria, hematuria, bloody or dark-colored stools. As per nursing staff no major  concerns. Patient was his name, cannot remember what he had for breakfast.  He is not oriented to time or place. Past Medical History:  Diagnosis Date   AF (paroxysmal atrial fibrillation) (HCC) 02/16/2018   Atrial fibrillation (HCC)    Atrial fibrillation with RVR (HCC) 02/16/2018   Bipolar 1 disorder (HCC)    Bipolar disorder (HCC) 02/17/2018   Dizziness 02/26/2020   DJD (degenerative joint disease)    Dyslipidemia 02/26/2020   Gait instability 02/16/2018   Hypertension    Mitral regurgitation    mild    Polycythemia 02/16/2018   Renal insufficiency    Stroke [facility])    Tremor 02/16/2018   Tremors of nervous system 01/2018   Past Surgical History:  Procedure Laterality Date   SHOULDER SURGERY      Allergies  Allergen Reactions   Lipitor [Atorvastatin] Other (See Comments)    Joint soreness    Allergies as of 01/06/2024       Reactions   Lipitor [atorvastatin] Other (See Comments)   Joint soreness        Medication List        Accurate as of January 06, 2024 10:20 AM. If you have any questions, ask your nurse or doctor.          ascorbic acid  500 MG tablet Commonly known as: VITAMIN C  Take 500 mg by mouth daily.   Calcium 600+D Plus Minerals 600-400 MG-UNIT Chew Chew 1 Piece by mouth in the morning and at bedtime.  docusate sodium  100 MG capsule Commonly known as: Colace Take 1 capsule (100 mg total) by mouth 2 (two) times daily.   donepezil  10 MG tablet Commonly known as: Aricept  Take 1 tablet (10 mg total) by mouth at bedtime.   Eliquis  5 MG Tabs tablet Generic drug: apixaban  TAKE 1 TABLET BY MOUTH TWICE DAILY.   furosemide 20 MG tablet Commonly known as: LASIX Take 20 mg by mouth daily.   gabapentin 100 MG capsule Commonly known as: NEURONTIN Take 200 mg by mouth at bedtime.   HYDROcodone -acetaminophen  5-325 MG tablet Commonly known as: NORCO/VICODIN Take 0.5 tablets by mouth in the morning and at bedtime.   latanoprost  0.005 % ophthalmic  solution Commonly known as: XALATAN  Place 1 drop into both eyes at bedtime.   lithium  carbonate 300 MG ER tablet Commonly known as: LITHOBID  Take 1 tablet (300 mg total) by mouth every evening.   multivitamin with minerals Tabs tablet Take 1 tablet by mouth daily.   potassium chloride 10 MEQ CR capsule Commonly known as: MICRO-K Take 10 mEq by mouth daily.   pravastatin  20 MG tablet Commonly known as: PRAVACHOL  Take 20 mg by mouth at bedtime.   sodium chloride  5 % ophthalmic solution Commonly known as: MURO 128 Place 1 drop into both eyes at bedtime.   Systane 0.4-0.3 % Soln Generic drug: Polyethyl Glycol-Propyl Glycol Apply 1 drop to eye every 4 (four) hours as needed (dry eyes). And four times a day scheduled   tamsulosin  0.4 MG Caps capsule Commonly known as: FLOMAX  Take 1 capsule (0.4 mg total) by mouth daily.   timolol  0.5 % ophthalmic solution Commonly known as: BETIMOL  Place 1 drop into both eyes daily.   traZODone  50 MG tablet Commonly known as: DESYREL  Take 1 tablet (50 mg total) by mouth at bedtime.        Review of Systems  Constitutional:  Negative for fever.  Respiratory:  Negative for cough and shortness of breath.   Cardiovascular:  Negative for chest pain.  Genitourinary:  Negative for dysuria and hematuria.  Musculoskeletal:  Positive for arthralgias.    Immunization History  Administered Date(s) Administered   Hepatitis B, ADULT 11/26/1997, 12/27/1997, 05/08/1998   Influenza, High Dose Seasonal PF 03/31/2023   Influenza-Unspecified 04/15/2022   Moderna Covid-19 Vaccine Bivalent Booster 6yrs & up 04/14/2023   Moderna Sars-Covid-2 Vaccination 07/03/2019, 07/31/2019, 05/13/2020, 03/19/2021   PPD Test 07/08/2022   Pneumococcal Conjugate-13 04/26/2014   Pneumococcal Polysaccharide-23 04/14/2005   RSV,unspecified 08/19/2022   Td 08/04/2022   Tdap 05/18/2016   Tetanus 08/04/2022   Zoster Recombinant(Shingrix) 07/29/2022, 10/28/2022    Pertinent  Health Maintenance Due  Topic Date Due   INFLUENZA VACCINE  01/28/2024      07/08/2022    9:22 AM 07/10/2022   11:13 AM 08/03/2022    4:30 PM 08/25/2022   11:49 AM 09/24/2022    1:55 PM  Fall Risk  Falls in the past year?  1 1 1  0  Was there an injury with Fall?  1 1 0 0  Fall Risk Category Calculator  3 3 1  0  Fall Risk Category (Retired)  High      (RETIRED) Patient Fall Risk Level High fall risk  High fall risk      Patient at Risk for Falls Due to  History of fall(s);Impaired balance/gait;Impaired mobility History of fall(s);Impaired balance/gait;Impaired mobility No Fall Risks   Fall risk Follow up  Falls evaluation completed  Falls evaluation completed Falls evaluation completed  Data saved with a previous flowsheet row definition   Functional Status Survey:    There were no vitals filed for this visit. There is no height or weight on file to calculate BMI. Physical Exam Constitutional:      Appearance: Normal appearance.  HENT:     Head: Normocephalic.  Cardiovascular:     Rate and Rhythm: Normal rate and regular rhythm.     Pulses: Normal pulses.     Heart sounds: Normal heart sounds.  Pulmonary:     Effort: No respiratory distress.     Breath sounds: No stridor. No wheezing or rales.  Abdominal:     General: Bowel sounds are normal. There is no distension.     Palpations: Abdomen is soft.     Tenderness: There is no abdominal tenderness. There is no guarding.  Neurological:     Mental Status: He is alert. Mental status is at baseline.     Motor: No weakness.     Labs reviewed: Recent Labs    07/15/23 0000 08/10/23 0000 08/17/23 0000  NA 139 131* 138  K 4.3 4.5 4.7  CL 103 100 104  CO2 27* 25* 28*  BUN 24* 19 17  CREATININE 1.1 1.2 1.2  CALCIUM 10.2 10.3 10.5   Recent Labs    05/25/23 0000 07/15/23 0000 08/10/23 0000  AST 19 20 19   ALT 13 10 11   ALKPHOS 84 82 75  ALBUMIN 3.8 3.7 3.8   Recent Labs    05/25/23 0000  07/15/23 0000 08/10/23 0000  WBC 10.6 9.1 9.3  NEUTROABS 7,590.00 8,199.00 6,547.00  HGB 11.9* 11.1* 10.5*  HCT 36* 34* 32*  PLT 322 296 347   Lab Results  Component Value Date   TSH 1.42 02/09/2023   Lab Results  Component Value Date   HGBA1C  08/24/2009    5.7 (NOTE) The ADA recommends the following therapeutic goal for glycemic control related to Hgb A1c measurement: Goal of therapy: <6.5 Hgb A1c  Reference: American Diabetes Association: Clinical Practice Recommendations 2010, Diabetes Care, 2010, 33: (Suppl  1).   Lab Results  Component Value Date   CHOL 116 12/03/2022   HDL 46 12/03/2022   LDLCALC 54 12/03/2022   LDLDIRECT 55 09/02/2020   TRIG 80 12/03/2022   CHOLHDL 3.3 09/02/2020    Significant Diagnostic Results in last 30 days:  No results found.  Assessment/Plan Major neurocognitive disorder No major concerns as per nursing staff Continue with supportive care Increase cognitively engaging activities and physical activities Continue with Aricept   Congestive heart failure Euvolemic on exam Continue with the Lasix Avoid salty foods  History of bipolar Follow-up with psychiatry Continue with lithium   History of insomnia Stable Continue with trazodone   BPH Continue with Flomax   OA Cont with norco Cont with PT  30 min Total time spent for obtaining history,  performing a medically appropriate examination and evaluation, reviewing the tests, documenting clinical information in the electronic or other health record,  ,care coordination (not separately reported)

## 2024-01-07 DIAGNOSIS — M6281 Muscle weakness (generalized): Secondary | ICD-10-CM | POA: Diagnosis not present

## 2024-01-07 DIAGNOSIS — M62521 Muscle wasting and atrophy, not elsewhere classified, right upper arm: Secondary | ICD-10-CM | POA: Diagnosis not present

## 2024-01-07 DIAGNOSIS — R29898 Other symptoms and signs involving the musculoskeletal system: Secondary | ICD-10-CM | POA: Diagnosis not present

## 2024-01-10 DIAGNOSIS — M62521 Muscle wasting and atrophy, not elsewhere classified, right upper arm: Secondary | ICD-10-CM | POA: Diagnosis not present

## 2024-01-10 DIAGNOSIS — R29898 Other symptoms and signs involving the musculoskeletal system: Secondary | ICD-10-CM | POA: Diagnosis not present

## 2024-01-10 DIAGNOSIS — M6281 Muscle weakness (generalized): Secondary | ICD-10-CM | POA: Diagnosis not present

## 2024-01-12 DIAGNOSIS — R29898 Other symptoms and signs involving the musculoskeletal system: Secondary | ICD-10-CM | POA: Diagnosis not present

## 2024-01-12 DIAGNOSIS — M6281 Muscle weakness (generalized): Secondary | ICD-10-CM | POA: Diagnosis not present

## 2024-01-12 DIAGNOSIS — M62521 Muscle wasting and atrophy, not elsewhere classified, right upper arm: Secondary | ICD-10-CM | POA: Diagnosis not present

## 2024-01-14 DIAGNOSIS — M62521 Muscle wasting and atrophy, not elsewhere classified, right upper arm: Secondary | ICD-10-CM | POA: Diagnosis not present

## 2024-01-14 DIAGNOSIS — R29898 Other symptoms and signs involving the musculoskeletal system: Secondary | ICD-10-CM | POA: Diagnosis not present

## 2024-01-14 DIAGNOSIS — M6281 Muscle weakness (generalized): Secondary | ICD-10-CM | POA: Diagnosis not present

## 2024-01-28 ENCOUNTER — Non-Acute Institutional Stay (SKILLED_NURSING_FACILITY): Payer: Self-pay | Admitting: Nurse Practitioner

## 2024-01-28 ENCOUNTER — Encounter: Payer: Self-pay | Admitting: Nurse Practitioner

## 2024-01-28 DIAGNOSIS — F5101 Primary insomnia: Secondary | ICD-10-CM

## 2024-01-28 DIAGNOSIS — J849 Interstitial pulmonary disease, unspecified: Secondary | ICD-10-CM | POA: Diagnosis not present

## 2024-01-28 DIAGNOSIS — R251 Tremor, unspecified: Secondary | ICD-10-CM | POA: Diagnosis not present

## 2024-01-28 DIAGNOSIS — M150 Primary generalized (osteo)arthritis: Secondary | ICD-10-CM

## 2024-01-28 DIAGNOSIS — R350 Frequency of micturition: Secondary | ICD-10-CM | POA: Diagnosis not present

## 2024-01-28 DIAGNOSIS — J209 Acute bronchitis, unspecified: Secondary | ICD-10-CM

## 2024-01-28 DIAGNOSIS — F319 Bipolar disorder, unspecified: Secondary | ICD-10-CM

## 2024-01-28 DIAGNOSIS — I509 Heart failure, unspecified: Secondary | ICD-10-CM

## 2024-01-28 DIAGNOSIS — F0390 Unspecified dementia without behavioral disturbance: Secondary | ICD-10-CM

## 2024-01-28 DIAGNOSIS — R001 Bradycardia, unspecified: Secondary | ICD-10-CM

## 2024-01-28 DIAGNOSIS — I4821 Permanent atrial fibrillation: Secondary | ICD-10-CM

## 2024-01-28 NOTE — Assessment & Plan Note (Signed)
 Sleeps well at night,  takes Trazodone  since 02/17/23 by psych

## 2024-01-28 NOTE — Assessment & Plan Note (Signed)
 No urinary retention,  taking Tamsulosin , off Myrbetriq by urology 11/18/23

## 2024-01-28 NOTE — Assessment & Plan Note (Signed)
stopped parkinson's medication because of affected his cognition, had resting tremor in left arm/leg in the past

## 2024-01-28 NOTE — Assessment & Plan Note (Addendum)
 Productive cough with yellow mucus for 2 to 3 days, discomfort in his upper chest with a cough, notices a runny nose, denies sore throat or shortness of breath, no O2 desaturation, patient is afebrile.  As needed guaifenesin relieves symptoms temporarily.  Chest x-ray AP/lateral, CBC/differential, CMP/GFR, BNP Augmentin 875/125 mg by mouth every 12 hours for 5 days, Tessalon 100 mg by mouth 3 times daily for 3 days

## 2024-01-28 NOTE — Assessment & Plan Note (Signed)
 SNF [facility] for supportive care, 07/16/22 MMSE 18/30, taking Deonepezil sine 8/21/2 by Psych             CVA, no focal weakness residual.

## 2024-01-28 NOTE — Assessment & Plan Note (Signed)
 Heart rate is within normal rangoff Metoprolol , Diltiazem  2/2 to bradycardia. 08/24/23 38/60 sec with V tach, stopped Metoprolol  per cardiologye,

## 2024-01-28 NOTE — Assessment & Plan Note (Signed)
 Euvolemic, EF 45-50% 11/2021, taking Furosemide, trace edema BLE, 05/10/23 CXR-mild pulmonary venous congestion, no infiltrate, followed by cardiology.

## 2024-01-28 NOTE — Assessment & Plan Note (Signed)
 shoulders pain, worsens at night, limited abduction ROM over shoulder level, hx of neck clicks with ROM, aches, c/o right sciatica pain, worsens in am, chronic left knee pain,  Norco and Gabapentin for pain control.

## 2024-01-28 NOTE — Assessment & Plan Note (Signed)
 Heart rates is in control, continue Eliquis 

## 2024-01-28 NOTE — Progress Notes (Signed)
 Location:   SNF [facility]  Nursing Home Room Number: 18 Place of Service:  SNF (31) Provider: Larwance Janit Cutter NP  Pcp, No  Patient Care Team: Pcp, No as PCP - General Bernie Lamar PARAS, MD as PCP - Cardiology (Cardiology)  Extended Emergency Contact Information Primary Emergency Contact: Cropper,[name] Address: [phone]           [facility] [address]          [place], [place] [address] [place] Home Phone: [phone] Mobile Phone: [phone] Relation: Spouse Secondary Emergency Contact: Palomarez,Andy Address: [address] [facility].          [place], [place] [address] [place] Home Phone: [phone] Mobile Phone: [phone] Relation: Son  Code Status:  DNR Goals of care: Advanced Directive information    01/06/2024   10:20 AM  Advanced Directives  Does Patient Have a Medical Advance Directive? Yes  Type of Advance Directive Out of facility DNR (pink MOST or yellow form);Healthcare Power of Attorney  Does patient want to make changes to medical advance directive? No - Patient declined  Copy of Healthcare Power of Attorney in Chart? No - copy requested     Chief Complaint  Patient presents with   Medical Management of Chronic Issues    HPI:  Pt is a 89 y.o. male seen today for medical management of chronic diseases.    Productive cough with yellow mucus for 2 to 3 days, discomfort in his upper chest with a cough, notices a runny nose, denies sore throat or shortness of breath, no O2 desaturation, patient is afebrile.  As needed guaifenesin relieves symptoms temporarily.  Urinary frequency, taking Tamsulosin , off Myrbetriq by urology 11/18/23              HOH L>R, HPOA-wife declined hearing aids.              Parkinson's disease, stopped parkinson's medication because of affected his cognition, had resting tremor in left arm/leg in the past             OP 12/01/22 dc'd Alendronate tscore -2.9 2017, -0.9 01/19/19, refused DEXA 11/02/22              OA shoulders pain, worsens at night, limited abduction ROM over shoulder level, hx of neck clicks with ROM, aches, c/o right sciatica pain, worsens in am, chronic left knee pain,  Norco and Gabapentin for pain control.              Cognitive impairment, SNF [facility] for supportive care, 07/16/22 MMSE 18/30, taking Deonepezil sine 8/21/2 by Psych             CVA, no focal weakness residual.              Afib, taking  Eliquis              Bradycardia, off Metoprolol , Diltiazem  2/2 to bradycardia. 08/24/23 38/60 sec with V tach, stopped Metoprolol  per cardiology             Bipolar disorder, followed by psychiatry, on Lithium , TSH 1.42 02/09/23, 07/16/22 Lithium  level 0.6(0.6-1.2)             Insomnia, takes Trazodone  since 02/17/23 by psych             CHF, EF 45-50% 11/2021, taking Furosemide, trace edema BLE, 05/10/23 CXR-mild pulmonary venous congestion, no infiltrate, followed by cardiology.  Gait abnormality, uses w/c for mobility              Hyponatremia, Na 138 08/17/23             Hyperbilirubinemia, bilirubin 0.6 07/15/23            Polycythemia,  Hgb 11.1 07/15/23             CKD, Bun/creat 17/1.15 08/17/23             Hypogonadism, hx of  testosterone  inj, dc'd by urology 11/18/23             Hyperlipidemia, takes Pravastatin              Glaucoma, eye drops.     Past Medical History:  Diagnosis Date   AF (paroxysmal atrial fibrillation) (HCC) 02/16/2018   Atrial fibrillation (HCC)    Atrial fibrillation with RVR (HCC) 02/16/2018   Bipolar 1 disorder (HCC)    Bipolar disorder (HCC) 02/17/2018   Dizziness 02/26/2020   DJD (degenerative joint disease)    Dyslipidemia 02/26/2020   Gait instability 02/16/2018   Hypertension    Mitral regurgitation    mild    Polycythemia 02/16/2018   Renal insufficiency    Stroke [facility])    Tremor 02/16/2018   Tremors of nervous system 01/2018   Past Surgical History:  Procedure Laterality Date   SHOULDER SURGERY      Allergies  Allergen  Reactions   Lipitor [Atorvastatin] Other (See Comments)    Joint soreness    Allergies as of 01/28/2024       Reactions   Lipitor [atorvastatin] Other (See Comments)   Joint soreness        Medication List        Accurate as of January 28, 2024  1:29 PM. If you have any questions, ask your nurse or doctor.          ascorbic acid  500 MG tablet Commonly known as: VITAMIN C  Take 500 mg by mouth daily.   Calcium 600+D Plus Minerals 600-400 MG-UNIT Chew Chew 1 Piece by mouth in the morning and at bedtime.   docusate sodium  100 MG capsule Commonly known as: Colace Take 1 capsule (100 mg total) by mouth 2 (two) times daily.   donepezil  10 MG tablet Commonly known as: Aricept  Take 1 tablet (10 mg total) by mouth at bedtime.   Eliquis  5 MG Tabs tablet Generic drug: apixaban  TAKE 1 TABLET BY MOUTH TWICE DAILY.   furosemide 20 MG tablet Commonly known as: LASIX Take 20 mg by mouth daily.   gabapentin 100 MG capsule Commonly known as: NEURONTIN Take 200 mg by mouth at bedtime.   HYDROcodone -acetaminophen  5-325 MG tablet Commonly known as: NORCO/VICODIN Take 0.5 tablets by mouth in the morning and at bedtime.   latanoprost  0.005 % ophthalmic solution Commonly known as: XALATAN  Place 1 drop into both eyes at bedtime.   lithium  carbonate 300 MG ER tablet Commonly known as: LITHOBID  Take 1 tablet (300 mg total) by mouth every evening.   multivitamin with minerals Tabs tablet Take 1 tablet by mouth daily.   potassium chloride 10 MEQ CR capsule Commonly known as: MICRO-K Take 10 mEq by mouth daily.   pravastatin  20 MG tablet Commonly known as: PRAVACHOL  Take 20 mg by mouth at bedtime.   sodium chloride  5 % ophthalmic solution Commonly known as: MURO 128 Place 1 drop into both eyes at bedtime.   Systane 0.4-0.3 % Soln Generic drug: Polyethyl Glycol-Propyl  Glycol Apply 1 drop to eye every 4 (four) hours as needed (dry eyes). And four times a day scheduled    tamsulosin  0.4 MG Caps capsule Commonly known as: FLOMAX  Take 1 capsule (0.4 mg total) by mouth daily.   timolol  0.5 % ophthalmic solution Commonly known as: BETIMOL  Place 1 drop into both eyes daily.   traZODone  50 MG tablet Commonly known as: DESYREL  Take 1 tablet (50 mg total) by mouth at bedtime.        Review of Systems  Constitutional:  Negative for appetite change, fatigue and fever.  HENT:  Positive for congestion, hearing loss and rhinorrhea. Negative for sinus pressure, sore throat, trouble swallowing and voice change.   Eyes:  Negative for visual disturbance.  Respiratory:  Positive for cough. Negative for chest tightness and wheezing.   Cardiovascular:  Positive for leg swelling.  Gastrointestinal:  Negative for abdominal pain and constipation.  Genitourinary:  Negative for dysuria and urgency.       Incontinent of urine.   Musculoskeletal:  Positive for arthralgias, back pain and gait problem.       Shoulders pain with above shoulder level abduction ROM, worsens at night. R sciatic pain, not new, worse in am when getting up, chronic left knee pain-worsened pain now Worsened left neck/shoulder pain /tingling, numbness arm, no decreased muscle strength.   Skin:  Negative for color change.  Neurological:  Negative for speech difficulty, weakness and headaches.  Psychiatric/Behavioral:  Positive for confusion. Negative for sleep disturbance. The patient is not nervous/anxious.        Early am awake, chronic    Immunization History  Administered Date(s) Administered   Hepatitis B, ADULT 11/26/1997, 12/27/1997, 05/08/1998   Influenza, High Dose Seasonal PF 03/31/2023   Influenza-Unspecified 04/15/2022   Moderna Covid-19 Vaccine Bivalent Booster 53yrs & up 04/14/2023   Moderna Sars-Covid-2 Vaccination 07/03/2019, 07/31/2019, 05/13/2020, 03/19/2021   PPD Test 07/08/2022   Pneumococcal Conjugate-13 04/26/2014   Pneumococcal Polysaccharide-23 04/14/2005    RSV,unspecified 08/19/2022   Td 08/04/2022   Tdap 05/18/2016   Tetanus 08/04/2022   Zoster Recombinant(Shingrix) 07/29/2022, 10/28/2022   Pertinent  Health Maintenance Due  Topic Date Due   INFLUENZA VACCINE  01/28/2024      07/08/2022    9:22 AM 07/10/2022   11:13 AM 08/03/2022    4:30 PM 08/25/2022   11:49 AM 09/24/2022    1:55 PM  Fall Risk  Falls in the past year?  1 1 1  0  Was there an injury with Fall?  1 1 0 0  Fall Risk Category Calculator  3 3 1  0  Fall Risk Category (Retired)  High      (RETIRED) Patient Fall Risk Level High fall risk  High fall risk      Patient at Risk for Falls Due to  History of fall(s);Impaired balance/gait;Impaired mobility History of fall(s);Impaired balance/gait;Impaired mobility No Fall Risks   Fall risk Follow up  Falls evaluation completed  Falls evaluation completed Falls evaluation completed      Data saved with a previous flowsheet row definition   Functional Status Survey:    Vitals:   01/28/24 1231  BP: 114/60  Pulse: 77  Resp: 20  Temp: 97.6 F (36.4 C)  SpO2: 91%  Weight: 201 lb 4.8 oz (91.3 kg)   Body mass index is 32.49 kg/m. Physical Exam Vitals and nursing note reviewed.  Constitutional:      Appearance: Normal appearance.  HENT:     Head: Normocephalic  and atraumatic.     Nose: Congestion present.     Mouth/Throat:     Mouth: Mucous membranes are moist.     Pharynx: No posterior oropharyngeal erythema.  Eyes:     Extraocular Movements: Extraocular movements intact.     Conjunctiva/sclera: Conjunctivae normal.     Pupils: Pupils are equal, round, and reactive to light.  Cardiovascular:     Rate and Rhythm: Normal rate. Rhythm irregular.     Heart sounds: No murmur heard. Pulmonary:     Effort: Pulmonary effort is normal.     Breath sounds: No rales.     Comments: Central congestion Abdominal:     General: Bowel sounds are normal.     Palpations: Abdomen is soft.     Tenderness: There is no abdominal  tenderness.  Musculoskeletal:        General: Tenderness present.     Cervical back: Normal range of motion and neck supple.     Right lower leg: Edema present.     Left lower leg: Edema present.     Comments: trace edema BLE.  Shoulders pain with above shoulder level abduction ROM, worsens at night, R sciatica pain worse in am/getting out of bed, chronic left knee pain  Skin:    General: Skin is warm and dry.     Comments: The right 5th toe nail yellow thick from previous examination   Neurological:     General: No focal deficit present.     Mental Status: He is alert and oriented to person, place, and time. Mental status is at baseline.     Motor: No weakness.     Gait: Gait abnormal.  Psychiatric:        Mood and Affect: Mood normal.        Behavior: Behavior normal.        Thought Content: Thought content normal.     Labs reviewed: Recent Labs    07/15/23 0000 08/10/23 0000 08/17/23 0000  NA 139 131* 138  K 4.3 4.5 4.7  CL 103 100 104  CO2 27* 25* 28*  BUN 24* 19 17  CREATININE 1.1 1.2 1.2  CALCIUM 10.2 10.3 10.5   Recent Labs    05/25/23 0000 07/15/23 0000 08/10/23 0000  AST 19 20 19   ALT 13 10 11   ALKPHOS 84 82 75  ALBUMIN 3.8 3.7 3.8   Recent Labs    05/25/23 0000 07/15/23 0000 08/10/23 0000  WBC 10.6 9.1 9.3  NEUTROABS 7,590.00 8,199.00 6,547.00  HGB 11.9* 11.1* 10.5*  HCT 36* 34* 32*  PLT 322 296 347   Lab Results  Component Value Date   TSH 1.42 02/09/2023   Lab Results  Component Value Date   HGBA1C  08/24/2009    5.7 (NOTE) The ADA recommends the following therapeutic goal for glycemic control related to Hgb A1c measurement: Goal of therapy: <6.5 Hgb A1c  Reference: American Diabetes Association: Clinical Practice Recommendations 2010, Diabetes Care, 2010, 33: (Suppl  1).   Lab Results  Component Value Date   CHOL 116 12/03/2022   HDL 46 12/03/2022   LDLCALC 54 12/03/2022   LDLDIRECT 55 09/02/2020   TRIG 80 12/03/2022   CHOLHDL  3.3 09/02/2020    Significant Diagnostic Results in last 30 days:  No results found.  Assessment/Plan  Acute bronchitis  Productive cough with yellow mucus for 2 to 3 days, discomfort in his upper chest with a cough, notices a runny nose, denies sore throat or  shortness of breath, no O2 desaturation, patient is afebrile.  As needed guaifenesin relieves symptoms temporarily.  Chest x-ray AP/lateral, CBC/differential, CMP/GFR, BNP Augmentin 875/125 mg by mouth every 12 hours for 5 days, Tessalon 100 mg by mouth 3 times daily for 3 days   Urinary frequency No urinary retention,  taking Tamsulosin , off Myrbetriq by urology 11/18/23  Tremors of nervous system stopped parkinson's medication because of affected his cognition, had resting tremor in left arm/leg in the past  Osteoarthritis, multiple sites shoulders pain, worsens at night, limited abduction ROM over shoulder level, hx of neck clicks with ROM, aches, c/o right sciatica pain, worsens in am, chronic left knee pain,  Norco and Gabapentin for pain control.   Major neurocognitive disorder [facility])  SNF [facility] for supportive care, 07/16/22 MMSE 18/30, taking Deonepezil sine 8/21/2 by Psych             CVA, no focal weakness residual.   Atrial fibrillation (HCC) Heart rates is in control, continue Eliquis   Bradycardia Heart rate is within normal rangoff Metoprolol , Diltiazem  2/2 to bradycardia. 08/24/23 38/60 sec with V tach, stopped Metoprolol  per cardiologye,   Bipolar disorder (HCC) Ms. Dewey is stable,  followed by psychiatry, on Lithium , TSH 1.42 02/09/23, 07/16/22 Lithium  level 0.6(0.6-1.2)  Insomnia Sleeps well at night,  takes Trazodone  since 02/17/23 by psych  Chronic CHF (HCC) Euvolemic, EF 45-50% 11/2021, taking Furosemide, trace edema BLE, 05/10/23 CXR-mild pulmonary venous congestion, no infiltrate, followed by cardiology.    Family/ staff Communication: Plan of care reviewed with the patient and charge nurse  Labs/tests  ordered: Chest x-ray AP/lateral, CBC/differential, CMP/GFR, BNP

## 2024-01-28 NOTE — Assessment & Plan Note (Signed)
 Ms. Dewey is stable,  followed by psychiatry, on Lithium , TSH 1.42 02/09/23, 07/16/22 Lithium  level 0.6(0.6-1.2)

## 2024-02-01 DIAGNOSIS — I10 Essential (primary) hypertension: Secondary | ICD-10-CM | POA: Diagnosis not present

## 2024-02-01 DIAGNOSIS — I5042 Chronic combined systolic (congestive) and diastolic (congestive) heart failure: Secondary | ICD-10-CM | POA: Diagnosis not present

## 2024-02-01 LAB — BASIC METABOLIC PANEL WITH GFR
BUN: 16 (ref 4–21)
CO2: 27 — AB (ref 13–22)
Chloride: 103 (ref 99–108)
Creatinine: 1.3 (ref 0.6–1.3)
Glucose: 90
Potassium: 4.5 meq/L (ref 3.5–5.1)
Sodium: 138 (ref 137–147)

## 2024-02-01 LAB — COMPREHENSIVE METABOLIC PANEL WITH GFR
Albumin: 3.8 (ref 3.5–5.0)
Calcium: 10.8 — AB (ref 8.7–10.7)
Globulin: 3.6
eGFR: 53

## 2024-02-01 LAB — CBC AND DIFFERENTIAL
HCT: 37 — AB (ref 41–53)
Hemoglobin: 11.6 — AB (ref 13.5–17.5)
Neutrophils Absolute: 7287
Platelets: 335 K/uL (ref 150–400)
WBC: 10.5

## 2024-02-01 LAB — HEPATIC FUNCTION PANEL
ALT: 12 U/L (ref 10–40)
AST: 23 (ref 14–40)
Alkaline Phosphatase: 101 (ref 25–125)
Bilirubin, Total: 0.6

## 2024-02-01 LAB — CBC: RBC: 4.07 (ref 3.87–5.11)

## 2024-02-07 ENCOUNTER — Encounter: Payer: Self-pay | Admitting: Nurse Practitioner

## 2024-02-07 ENCOUNTER — Non-Acute Institutional Stay (SKILLED_NURSING_FACILITY): Payer: Self-pay | Admitting: Nurse Practitioner

## 2024-02-07 DIAGNOSIS — R400 Somnolence: Secondary | ICD-10-CM | POA: Diagnosis not present

## 2024-02-07 DIAGNOSIS — E871 Hypo-osmolality and hyponatremia: Secondary | ICD-10-CM

## 2024-02-07 DIAGNOSIS — Z66 Do not resuscitate: Secondary | ICD-10-CM

## 2024-02-07 DIAGNOSIS — R4182 Altered mental status, unspecified: Secondary | ICD-10-CM | POA: Insufficient documentation

## 2024-02-07 DIAGNOSIS — F0390 Unspecified dementia without behavioral disturbance: Secondary | ICD-10-CM

## 2024-02-07 DIAGNOSIS — R059 Cough, unspecified: Secondary | ICD-10-CM | POA: Diagnosis not present

## 2024-02-07 DIAGNOSIS — R350 Frequency of micturition: Secondary | ICD-10-CM | POA: Diagnosis not present

## 2024-02-07 DIAGNOSIS — R251 Tremor, unspecified: Secondary | ICD-10-CM | POA: Diagnosis not present

## 2024-02-07 DIAGNOSIS — R001 Bradycardia, unspecified: Secondary | ICD-10-CM

## 2024-02-07 DIAGNOSIS — I4819 Other persistent atrial fibrillation: Secondary | ICD-10-CM

## 2024-02-07 DIAGNOSIS — M150 Primary generalized (osteo)arthritis: Secondary | ICD-10-CM

## 2024-02-07 DIAGNOSIS — I509 Heart failure, unspecified: Secondary | ICD-10-CM

## 2024-02-07 DIAGNOSIS — I5042 Chronic combined systolic (congestive) and diastolic (congestive) heart failure: Secondary | ICD-10-CM | POA: Diagnosis not present

## 2024-02-07 DIAGNOSIS — I10 Essential (primary) hypertension: Secondary | ICD-10-CM | POA: Diagnosis not present

## 2024-02-07 DIAGNOSIS — F319 Bipolar disorder, unspecified: Secondary | ICD-10-CM

## 2024-02-07 LAB — CBC AND DIFFERENTIAL
HCT: 35 — AB (ref 41–53)
Hemoglobin: 11.5 — AB (ref 13.5–17.5)
Neutrophils Absolute: 8640
WBC: 12

## 2024-02-07 LAB — COMPREHENSIVE METABOLIC PANEL WITH GFR
Albumin: 3.9 (ref 3.5–5.0)
Calcium: 11.1 — AB (ref 8.7–10.7)
Globulin: 3.5
eGFR: 55

## 2024-02-07 LAB — HEPATIC FUNCTION PANEL
ALT: 14 U/L (ref 10–40)
AST: 20 (ref 14–40)
Alkaline Phosphatase: 95 (ref 25–125)
Bilirubin, Total: 0.7

## 2024-02-07 LAB — BASIC METABOLIC PANEL WITH GFR
BUN: 21 (ref 4–21)
CO2: 28 — AB (ref 13–22)
Chloride: 102 (ref 99–108)
Creatinine: 1.3 (ref 0.6–1.3)
Glucose: 153
Potassium: 4.9 meq/L (ref 3.5–5.1)
Sodium: 137 (ref 137–147)

## 2024-02-07 LAB — CBC: RBC: 3.92 (ref 3.87–5.11)

## 2024-02-07 NOTE — Assessment & Plan Note (Signed)
 stopped parkinson's medication because of affected his cognition, had resting tremor in left arm/leg in the past

## 2024-02-07 NOTE — Assessment & Plan Note (Signed)
 off Metoprolol , Diltiazem  2/2 to bradycardia. 08/24/23 38/60 sec with V tach, stopped Metoprolol  per cardiology

## 2024-02-07 NOTE — Assessment & Plan Note (Signed)
 Euvolemic, EF 45-50% 11/2021, taking Furosemide, trace edema BLE, 05/10/23 CXR-mild pulmonary venous congestion, no infiltrate, followed by cardiology.

## 2024-02-07 NOTE — Assessment & Plan Note (Signed)
 SNF [facility] for supportive care, 07/16/22 MMSE 18/30, taking Deonepezil sine 8/21/2 by Psych             CVA, no focal weakness residual.

## 2024-02-07 NOTE — Assessment & Plan Note (Signed)
Heart rate is in control, taking Eliquis

## 2024-02-07 NOTE — Assessment & Plan Note (Addendum)
 reported the patient is off is baseline, stated feeling as if he is not really here.  The patient appears to sleepy upon my visit, denied headache, change of vision, shortness of breath, chest pain/palpitation, nausea, vomiting, and or dysuria.  The patient is afebrile, no O2 desaturation, no new neurological deficit. Vital signs and neurochecks every 4 hours for 72 hours CBC/differential, CMP/GFR, lithium  level, TSH 02/07/24 wbc 12.0, Hgb 11.5, plt 377, neutrophils 72, Na 137, K 4.9, Bun 21, creat 1.26 Obtain CXR ap/lateral, UA C/S in setting of mildly elevated wbc.

## 2024-02-07 NOTE — Assessment & Plan Note (Signed)
 Na 138 02/01/24

## 2024-02-07 NOTE — Assessment & Plan Note (Signed)
 shoulders pain, worsens at night, limited abduction ROM over shoulder level, hx of neck clicks with ROM, aches, c/o right sciatica pain, worsens in am, chronic left knee pain,  Norco and Gabapentin for pain control.

## 2024-02-07 NOTE — Progress Notes (Signed)
 Location:  [facility] Room Number: [phone] A Place of Service:  SNF (31) Provider:  [facility] Anila Bojarski, N.P.  Patient Care Team: Pcp, No as PCP - General Bernie Lamar PARAS, MD as PCP - Cardiology (Cardiology)  Extended Emergency Contact Information Primary Emergency Contact: Lamprecht,[name] Address: [phone]           [facility] [address]          [place], [place] [address] [place] Home Phone: [phone] Mobile Phone: [phone] Relation: Spouse Secondary Emergency Contact: Yurkovich,Andy Address: [address]          [place], [place] [address] [place] Home Phone: [phone] Mobile Phone: [phone] Relation: Son  Code Status:  DNR Goals of care: Advanced Directive information    01/06/2024   10:20 AM  Advanced Directives  Does Patient Have a Medical Advance Directive? Yes  Type of Advance Directive Out of facility DNR (pink MOST or yellow form);Healthcare Power of Attorney  Does patient want to make changes to medical advance directive? No - Patient declined  Copy of Healthcare Power of Attorney in Chart? No - copy requested     Chief Complaint  Patient presents with   Altered Mental Status    HPI:  Pt is a 89 y.o. male seen today for an acute visit for reported the patient is off is baseline, stated feeling as if he is not really here.  The patient appears to sleepy upon my visit, denied headache, change of vision, shortness of breath, chest pain/palpitation, nausea, vomiting, and or dysuria.  The patient is afebrile, no O2 desaturation, no new neurological deficit.   Urinary frequency, taking Tamsulosin , off Myrbetriq by urology 11/18/23              HOH L>R, HPOA-wife declined hearing aids.              Parkinson's disease, stopped parkinson's medication because of affected his cognition, had resting tremor in left arm/leg in the past             OP 12/01/22 dc'd Alendronate tscore -2.9 2017,  -0.9 01/19/19, refused DEXA 11/02/22             OA shoulders pain, worsens at night, limited abduction ROM over shoulder level, hx of neck clicks with ROM, aches, c/o right sciatica pain, worsens in am, chronic left knee pain,  Norco and Gabapentin for pain control.              Cognitive impairment, SNF [facility] for supportive care, 07/16/22 MMSE 18/30, taking Deonepezil sine 8/21/2 by Psych             CVA, no focal weakness residual.              Afib, taking  Eliquis              Bradycardia, off Metoprolol , Diltiazem  2/2 to bradycardia. 08/24/23 38/60 sec with V tach, stopped Metoprolol  per cardiology             Bipolar disorder, followed by psychiatry, on Lithium , TSH 1.42 02/09/23, 07/16/22 Lithium  level 0.6(0.6-1.2)             Insomnia, takes Trazodone  since 02/17/23 by psych             CHF, EF 45-50% 11/2021, taking Furosemide, trace edema BLE, 05/10/23 CXR-mild pulmonary venous congestion, no infiltrate, followed by cardiology.  Gait abnormality, uses w/c for mobility              Hyponatremia, Na 138 02/01/24             Hyperbilirubinemia, bilirubin 0.6 07/15/23=0.6 02/01/24            Polycythemia,  Hgb 11.6 02/01/24             CKD, Bun/creat 16/1.3 02/01/24             Hypogonadism, hx of  testosterone  inj, dc'd by urology 11/18/23             Hyperlipidemia, takes Pravastatin              Glaucoma, eye drops.   Past Medical History:  Diagnosis Date   AF (paroxysmal atrial fibrillation) (HCC) 02/16/2018   Atrial fibrillation (HCC)    Atrial fibrillation with RVR (HCC) 02/16/2018   Bipolar 1 disorder (HCC)    Bipolar disorder (HCC) 02/17/2018   Dizziness 02/26/2020   DJD (degenerative joint disease)    Dyslipidemia 02/26/2020   Gait instability 02/16/2018   Hypertension    Mitral regurgitation    mild    Polycythemia 02/16/2018   Renal insufficiency    Stroke [facility])    Tremor 02/16/2018   Tremors of nervous system 01/2018   Past Surgical History:  Procedure Laterality Date    SHOULDER SURGERY      Allergies  Allergen Reactions   Lipitor [Atorvastatin] Other (See Comments)    Joint soreness    Outpatient Encounter Medications as of 02/07/2024  Medication Sig   ascorbic acid  (VITAMIN C ) 500 MG tablet Take 500 mg by mouth daily.   Calcium Carbonate-Vit D-Min (CALCIUM 600+D PLUS MINERALS) 600-400 MG-UNIT CHEW Chew 1 Piece by mouth in the morning and at bedtime.   docusate sodium  (COLACE) 100 MG capsule Take 1 capsule (100 mg total) by mouth 2 (two) times daily.   donepezil  (ARICEPT ) 5 MG tablet Take 5 mg by mouth at bedtime.   ELIQUIS  5 MG TABS tablet TAKE 1 TABLET BY MOUTH TWICE DAILY.   furosemide (LASIX) 20 MG tablet Take 20 mg by mouth daily.   gabapentin (NEURONTIN) 100 MG capsule Take 200 mg by mouth at bedtime.   HYDROcodone -acetaminophen  (NORCO/VICODIN) 5-325 MG tablet Take 0.5 tablets by mouth in the morning and at bedtime.   latanoprost  (XALATAN ) 0.005 % ophthalmic solution Place 1 drop into both eyes at bedtime.   lithium  carbonate (LITHOBID ) 300 MG ER tablet Take 1 tablet (300 mg total) by mouth every evening.   Multiple Vitamin (MULTIVITAMIN WITH MINERALS) TABS tablet Take 1 tablet by mouth daily.   Polyethyl Glycol-Propyl Glycol (SYSTANE) 0.4-0.3 % SOLN Apply 1 drop to eye every 4 (four) hours as needed (dry eyes). And four times a day scheduled   potassium chloride (MICRO-K) 10 MEQ CR capsule Take 10 mEq by mouth daily.   pravastatin  (PRAVACHOL ) 20 MG tablet Take 20 mg by mouth at bedtime.   sodium chloride  (MURO 128) 5 % ophthalmic solution Place 1 drop into both eyes at bedtime.   tamsulosin  (FLOMAX ) 0.4 MG CAPS capsule Take 1 capsule (0.4 mg total) by mouth daily.   timolol  (BETIMOL ) 0.5 % ophthalmic solution Place 1 drop into both eyes daily.   traZODone  (DESYREL ) 50 MG tablet Take 1 tablet (50 mg total) by mouth at bedtime.   [DISCONTINUED] donepezil  (ARICEPT ) 10 MG tablet Take 1 tablet (10 mg total) by mouth at bedtime. (Patient not taking:  Reported  on 02/07/2024)   No facility-administered encounter medications on file as of 02/07/2024.    Review of Systems  Constitutional:  Negative for appetite change, fatigue and fever.  HENT:  Positive for hearing loss. Negative for congestion, rhinorrhea, sinus pressure, sore throat, trouble swallowing and voice change.   Eyes:  Negative for visual disturbance.  Respiratory:  Positive for cough. Negative for chest tightness and wheezing.   Cardiovascular:  Positive for leg swelling.  Gastrointestinal:  Negative for abdominal pain and constipation.  Genitourinary:  Negative for dysuria and urgency.       Incontinent of urine.   Musculoskeletal:  Positive for arthralgias, back pain and gait problem.       Shoulders pain with above shoulder level abduction ROM, worsens at night. R sciatic pain, not new, worse in am when getting up, chronic left knee pain-worsened pain now Worsened left neck/shoulder pain /tingling, numbness arm, no decreased muscle strength.   Skin:  Negative for color change.  Neurological:  Negative for speech difficulty, weakness and headaches.  Psychiatric/Behavioral:  Positive for confusion. Negative for sleep disturbance. The patient is not nervous/anxious.        Early am awake, chronic Appears to sleepy upon my examination    Immunization History  Administered Date(s) Administered   Hepatitis B, ADULT 11/26/1997, 12/27/1997, 05/08/1998   Influenza, High Dose Seasonal PF 03/31/2023   Influenza-Unspecified 04/15/2022   Moderna Covid-19 Vaccine Bivalent Booster 25yrs & up 04/14/2023   Moderna Sars-Covid-2 Vaccination 07/03/2019, 07/31/2019, 05/13/2020, 03/19/2021   PPD Test 07/08/2022   Pneumococcal Conjugate-13 04/26/2014   Pneumococcal Polysaccharide-23 04/14/2005   RSV,unspecified 08/19/2022   Td 08/04/2022   Tdap 05/18/2016   Tetanus 08/04/2022   Zoster Recombinant(Shingrix) 07/29/2022, 10/28/2022   Pertinent  Health Maintenance Due  Topic Date Due    INFLUENZA VACCINE  02/28/2024 (Originally 01/28/2024)      07/08/2022    9:22 AM 07/10/2022   11:13 AM 08/03/2022    4:30 PM 08/25/2022   11:49 AM 09/24/2022    1:55 PM  Fall Risk  Falls in the past year?  1 1 1  0  Was there an injury with Fall?  1 1 0 0  Fall Risk Category Calculator  3 3 1  0  Fall Risk Category (Retired)  High      (RETIRED) Patient Fall Risk Level High fall risk  High fall risk      Patient at Risk for Falls Due to  History of fall(s);Impaired balance/gait;Impaired mobility History of fall(s);Impaired balance/gait;Impaired mobility No Fall Risks   Fall risk Follow up  Falls evaluation completed  Falls evaluation completed Falls evaluation completed      Data saved with a previous flowsheet row definition   Functional Status Survey:    Vitals:   02/07/24 1147  BP: (!) 100/57  Pulse: 83  Resp: 20  Temp: (!) 97.3 F (36.3 C)  SpO2: 94%  Weight: 205 lb (93 kg)  Height: 5' 6 (1.676 m)   Body mass index is 33.09 kg/m. Physical Exam Vitals and nursing note reviewed.  Constitutional:      Appearance: Normal appearance.  HENT:     Head: Normocephalic and atraumatic.     Nose: No congestion.     Mouth/Throat:     Mouth: Mucous membranes are moist.     Pharynx: No posterior oropharyngeal erythema.  Eyes:     Extraocular Movements: Extraocular movements intact.     Conjunctiva/sclera: Conjunctivae normal.     Pupils: Pupils are  equal, round, and reactive to light.  Cardiovascular:     Rate and Rhythm: Normal rate. Rhythm irregular.     Heart sounds: No murmur heard. Pulmonary:     Effort: Pulmonary effort is normal.     Breath sounds: No rales.     Comments: Central congestion Abdominal:     General: Bowel sounds are normal.     Palpations: Abdomen is soft.     Tenderness: There is no abdominal tenderness.  Musculoskeletal:        General: Tenderness present.     Cervical back: Normal range of motion and neck supple.     Right lower leg: Edema present.      Left lower leg: Edema present.     Comments: trace edema BLE.  Shoulders pain with above shoulder level abduction ROM, worsens at night, R sciatica pain worse in am/getting out of bed, chronic left knee pain  Skin:    General: Skin is warm and dry.     Comments: The right 5th toe nail yellow thick from previous examination   Neurological:     General: No focal deficit present.     Mental Status: He is alert and oriented to person, place, and time. Mental status is at baseline.     Motor: No weakness.     Gait: Gait abnormal.     Comments: Able to wake up easily and answer questions appropriately upon my examination  Psychiatric:        Mood and Affect: Mood normal.        Behavior: Behavior normal.        Thought Content: Thought content normal.     Labs reviewed: Recent Labs    08/10/23 0000 08/17/23 0000 02/01/24 0000  NA 131* 138 138  K 4.5 4.7 4.5  CL 100 104 103  CO2 25* 28* 27*  BUN 19 17 16   CREATININE 1.2 1.2 1.3  CALCIUM 10.3 10.5 10.8*   Recent Labs    07/15/23 0000 08/10/23 0000 02/01/24 0000  AST 20 19 23   ALT 10 11 12   ALKPHOS 82 75 101  ALBUMIN 3.7 3.8 3.8   Recent Labs    07/15/23 0000 08/10/23 0000 02/01/24 0000  WBC 9.1 9.3 10.5  NEUTROABS 8,199.00 6,547.00 7,287.00  HGB 11.1* 10.5* 11.6*  HCT 34* 32* 37*  PLT 296 347 335   Lab Results  Component Value Date   TSH 1.42 02/09/2023   Lab Results  Component Value Date   HGBA1C  08/24/2009    5.7 (NOTE) The ADA recommends the following therapeutic goal for glycemic control related to Hgb A1c measurement: Goal of therapy: <6.5 Hgb A1c  Reference: American Diabetes Association: Clinical Practice Recommendations 2010, Diabetes Care, 2010, 33: (Suppl  1).   Lab Results  Component Value Date   CHOL 116 12/03/2022   HDL 46 12/03/2022   LDLCALC 54 12/03/2022   LDLDIRECT 55 09/02/2020   TRIG 80 12/03/2022   CHOLHDL 3.3 09/02/2020    Significant Diagnostic Results in last 30 days:   No results found.  Assessment/Plan Altered mental status reported the patient is off is baseline, stated feeling as if he is not really here.  The patient appears to sleepy upon my visit, denied headache, change of vision, shortness of breath, chest pain/palpitation, nausea, vomiting, and or dysuria.  The patient is afebrile, no O2 desaturation, no new neurological deficit. Vital signs and neurochecks every 4 hours for 72 hours CBC/differential, CMP/GFR, lithium  level, TSH  02/07/24 wbc 12.0, Hgb 11.5, plt 377, neutrophils 72, Na 137, K 4.9, Bun 21, creat 1.26 Obtain CXR ap/lateral, UA C/S in setting of mildly elevated wbc.   Urinary frequency No urinary retention, taking Tamsulosin , off Myrbetriq by urology 11/18/23  Tremor stopped parkinson's medication because of affected his cognition, had resting tremor in left arm/leg in the past  Osteoarthritis, multiple sites  shoulders pain, worsens at night, limited abduction ROM over shoulder level, hx of neck clicks with ROM, aches, c/o right sciatica pain, worsens in am, chronic left knee pain,  Norco and Gabapentin for pain control.   Major neurocognitive disorder [facility])  SNF [facility] for supportive care, 07/16/22 MMSE 18/30, taking Deonepezil sine 8/21/2 by Psych             CVA, no focal weakness residual.   Atrial fibrillation, persistent (HCC) Heart rate is in control, taking  Eliquis   Bradycardia  off Metoprolol , Diltiazem  2/2 to bradycardia. 08/24/23 38/60 sec with V tach, stopped Metoprolol  per cardiology  Bipolar disorder [facility])  followed by psychiatry, on Lithium , TSH 1.42 02/09/23, 07/16/22 Lithium  level 0.6(0.6-1.2), Insomnia, takes Trazodone  since 02/17/23 by psych  Chronic CHF (HCC) Euvolemic, EF 45-50% 11/2021, taking Furosemide, trace edema BLE, 05/10/23 CXR-mild pulmonary venous congestion, no infiltrate, followed by cardiology.   Hyponatremia Na 138 02/01/24    Family/ staff Communication: Plan of care reviewed with the  patient and charge nurse  Labs/tests ordered: Lithium  level, TSH, CBC/differential, CMP/GFR, CXR, UA C/S

## 2024-02-07 NOTE — Assessment & Plan Note (Signed)
 followed by psychiatry, on Lithium , TSH 1.42 02/09/23, 07/16/22 Lithium  level 0.6(0.6-1.2), Insomnia, takes Trazodone  since 02/17/23 by psych

## 2024-02-07 NOTE — Assessment & Plan Note (Signed)
 No urinary retention, taking Tamsulosin , off Myrbetriq by urology 11/18/23

## 2024-02-08 DIAGNOSIS — I10 Essential (primary) hypertension: Secondary | ICD-10-CM | POA: Diagnosis not present

## 2024-02-08 DIAGNOSIS — I5042 Chronic combined systolic (congestive) and diastolic (congestive) heart failure: Secondary | ICD-10-CM | POA: Diagnosis not present

## 2024-02-08 LAB — TSH: TSH: 1.78 (ref 0.41–5.90)

## 2024-02-24 ENCOUNTER — Encounter: Payer: Self-pay | Admitting: Nurse Practitioner

## 2024-02-24 ENCOUNTER — Non-Acute Institutional Stay (SKILLED_NURSING_FACILITY): Payer: Self-pay | Admitting: Nurse Practitioner

## 2024-02-24 DIAGNOSIS — I4821 Permanent atrial fibrillation: Secondary | ICD-10-CM

## 2024-02-24 DIAGNOSIS — R350 Frequency of micturition: Secondary | ICD-10-CM

## 2024-02-24 DIAGNOSIS — Z66 Do not resuscitate: Secondary | ICD-10-CM

## 2024-02-24 DIAGNOSIS — F0390 Unspecified dementia without behavioral disturbance: Secondary | ICD-10-CM | POA: Diagnosis not present

## 2024-02-24 DIAGNOSIS — M150 Primary generalized (osteo)arthritis: Secondary | ICD-10-CM

## 2024-02-24 DIAGNOSIS — K644 Residual hemorrhoidal skin tags: Secondary | ICD-10-CM

## 2024-02-24 DIAGNOSIS — R001 Bradycardia, unspecified: Secondary | ICD-10-CM | POA: Diagnosis not present

## 2024-02-24 DIAGNOSIS — F319 Bipolar disorder, unspecified: Secondary | ICD-10-CM

## 2024-02-24 NOTE — Assessment & Plan Note (Signed)
 Cognitive impairment, SNF [facility] for supportive care, 07/16/22 MMSE 18/30, taking Deonepezil sine 8/21/2 by Psych, intermittent confusions.              CVA, no focal weakness residual.

## 2024-02-24 NOTE — Assessment & Plan Note (Signed)
Heart rate is in control, taking Eliquis

## 2024-02-24 NOTE — Assessment & Plan Note (Signed)
 Compensated clinically, EF 45-50% 11/2021, taking Furosemide, trace edema BLE, 05/10/23 CXR-mild pulmonary venous congestion, no infiltrate, followed by cardiology.

## 2024-02-24 NOTE — Assessment & Plan Note (Signed)
 shoulders pain, worsens at night, limited abduction ROM over shoulder level, hx of neck clicks with ROM, aches, c/o right sciatica pain, worsens in am, chronic left knee pain,  Norco and Gabapentin for pain control.

## 2024-02-24 NOTE — Progress Notes (Unsigned)
 Location:  [facility] Room Number: [phone] A Place of Service:  SNF (31) Provider:  Larwance Onia Shiflett, N.P.   Pcp, No  Patient Care Team: Pcp, No as PCP - General Bernie Lamar PARAS, MD as PCP - Cardiology (Cardiology)  Extended Emergency Contact Information Primary Emergency Contact: Raybourn,[name] Address: [phone]           [facility] [address]          [place], [place] [address] [place] Home Phone: [phone] Mobile Phone: [phone] Relation: Spouse Secondary Emergency Contact: Byrer,Andy Address: [address]          [place], [place] [address] [place] Home Phone: [phone] Mobile Phone: [phone] Relation: Son  Code Status:  DNR Goals of care: Advanced Directive information    01/06/2024   10:20 AM  Advanced Directives  Does Patient Have a Medical Advance Directive? Yes  Type of Advance Directive Out of facility DNR (pink MOST or yellow form);Healthcare Power of Attorney  Does patient want to make changes to medical advance directive? No - Patient declined  Copy of Healthcare Power of Attorney in Chart? No - copy requested     Chief Complaint  Patient presents with   Medical Management of Chronic Issues    Routine visit     HPI:  Pt is a 89 y.o. male seen today for medical management of chronic diseases.   Urinary frequency, taking Tamsulosin , off Myrbetriq by urology 11/18/23              HOH L>R, HPOA-wife declined hearing aids.              Parkinson's disease, stopped parkinson's medication because of affected his cognition, had resting tremor in left arm/leg in the past             OP 12/01/22 dc'd Alendronate tscore -2.9 2017, -0.9 01/19/19, refused DEXA 11/02/22             OA shoulders pain, worsens at night, limited abduction ROM over shoulder level, hx of neck clicks with ROM, aches, c/o right sciatica pain, worsens in am, chronic left knee pain,  Norco and Gabapentin for pain  control.              Cognitive impairment, SNF [facility] for supportive care, 07/16/22 MMSE 18/30, taking Deonepezil sine 8/21/2 by Psych, intermittent confusions.              CVA, no focal weakness residual.              Afib, taking  Eliquis              Bradycardia, off Metoprolol , Diltiazem  2/2 to bradycardia. 08/24/23 38/60 sec with V tach, stopped Metoprolol  per cardiology             Bipolar disorder, followed by psychiatry, on Lithium , TSH 1.78 02/08/24, Lithium  level 0.5(0.6-1.2) 02/08/24             Insomnia, takes Trazodone  since 02/17/23 by psych             CHF, EF 45-50% 11/2021, taking Furosemide, trace edema BLE, 05/10/23 CXR-mild pulmonary venous congestion, no infiltrate, followed by cardiology.              Gait abnormality, uses w/c for mobility              Hyponatremia, Na 137 02/07/24  Hyperbilirubinemia, bilirubin 0.7 02/07/24            Polycythemia,  Hgb 11.5 02/07/24             CKD, Bun/creat 21/1.3 02/07/24             Hypogonadism, hx of  testosterone  inj, dc'd by urology 11/18/23             Hyperlipidemia, takes Pravastatin              Glaucoma, eye drops.    Past Medical History:  Diagnosis Date   AF (paroxysmal atrial fibrillation) (HCC) 02/16/2018   Atrial fibrillation (HCC)    Atrial fibrillation with RVR (HCC) 02/16/2018   Bipolar 1 disorder (HCC)    Bipolar disorder (HCC) 02/17/2018   Dizziness 02/26/2020   DJD (degenerative joint disease)    Dyslipidemia 02/26/2020   Gait instability 02/16/2018   Hypertension    Mitral regurgitation    mild    Polycythemia 02/16/2018   Renal insufficiency    Stroke [facility])    Tremor 02/16/2018   Tremors of nervous system 01/2018   Past Surgical History:  Procedure Laterality Date   SHOULDER SURGERY      Allergies  Allergen Reactions   Lipitor [Atorvastatin] Other (See Comments)    Joint soreness    Outpatient Encounter Medications as of 02/24/2024  Medication Sig   ascorbic acid  (VITAMIN C ) 500 MG tablet Take  500 mg by mouth daily.   Calcium Carbonate-Vit D-Min (CALCIUM 600+D PLUS MINERALS) 600-400 MG-UNIT CHEW Chew 1 Piece by mouth in the morning and at bedtime.   docusate sodium  (COLACE) 100 MG capsule Take 1 capsule (100 mg total) by mouth 2 (two) times daily.   donepezil  (ARICEPT ) 5 MG tablet Take 5 mg by mouth at bedtime.   ELIQUIS  5 MG TABS tablet TAKE 1 TABLET BY MOUTH TWICE DAILY.   furosemide (LASIX) 20 MG tablet Take 20 mg by mouth daily.   gabapentin (NEURONTIN) 100 MG capsule Take 200 mg by mouth at bedtime.   HYDROcodone -acetaminophen  (NORCO/VICODIN) 5-325 MG tablet Take 0.5 tablets by mouth in the morning and at bedtime.   latanoprost  (XALATAN ) 0.005 % ophthalmic solution Place 1 drop into both eyes at bedtime.   lithium  carbonate (LITHOBID ) 300 MG ER tablet Take 1 tablet (300 mg total) by mouth every evening.   Multiple Vitamin (MULTIVITAMIN WITH MINERALS) TABS tablet Take 1 tablet by mouth daily.   Polyethyl Glycol-Propyl Glycol (SYSTANE) 0.4-0.3 % SOLN Apply 1 drop to eye every 4 (four) hours as needed (dry eyes). And four times a day scheduled   potassium chloride (MICRO-K) 10 MEQ CR capsule Take 10 mEq by mouth daily.   pravastatin  (PRAVACHOL ) 20 MG tablet Take 20 mg by mouth at bedtime.   sodium chloride  (MURO 128) 5 % ophthalmic solution Place 1 drop into both eyes at bedtime.   tamsulosin  (FLOMAX ) 0.4 MG CAPS capsule Take 1 capsule (0.4 mg total) by mouth daily.   timolol  (BETIMOL ) 0.5 % ophthalmic solution Place 1 drop into both eyes daily.   traZODone  (DESYREL ) 50 MG tablet Take 1 tablet (50 mg total) by mouth at bedtime.   No facility-administered encounter medications on file as of 02/24/2024.    Review of Systems  Constitutional:  Negative for appetite change, fatigue and fever.  HENT:  Positive for hearing loss. Negative for congestion, rhinorrhea, sinus pressure, sore throat, trouble swallowing and voice change.   Eyes:  Negative for visual disturbance.  Respiratory:  Positive for cough. Negative for chest tightness and wheezing.   Cardiovascular:  Positive for leg swelling.  Gastrointestinal:  Negative for abdominal pain and constipation.  Genitourinary:  Negative for dysuria and urgency.       Incontinent of urine.   Musculoskeletal:  Positive for arthralgias, back pain and gait problem.       Shoulders pain with above shoulder level abduction ROM, worsens at night. R sciatic pain, not new, worse in am when getting up, chronic left knee pain-worsened pain now Worsened left neck/shoulder pain /tingling, numbness arm, no decreased muscle strength.   Skin:  Negative for color change.  Neurological:  Negative for speech difficulty, weakness and headaches.  Psychiatric/Behavioral:  Positive for confusion. Negative for sleep disturbance. The patient is not nervous/anxious.        Early am awake, chronic     Immunization History  Administered Date(s) Administered   Hepatitis B, ADULT 11/26/1997, 12/27/1997, 05/08/1998   INFLUENZA, HIGH DOSE SEASONAL PF 03/31/2023   Influenza-Unspecified 04/15/2022   Moderna Covid-19 Vaccine Bivalent Booster 100yrs & up 04/14/2023   Moderna Sars-Covid-2 Vaccination 07/03/2019, 07/31/2019, 05/13/2020, 03/19/2021   PPD Test 07/08/2022   Pneumococcal Conjugate-13 04/26/2014   Pneumococcal Polysaccharide-23 04/14/2005   RSV,unspecified 08/19/2022   Td 08/04/2022   Tdap 05/18/2016   Tetanus 08/04/2022   Zoster Recombinant(Shingrix) 07/29/2022, 10/28/2022   Pertinent  Health Maintenance Due  Topic Date Due   INFLUENZA VACCINE  02/28/2024 (Originally 01/28/2024)      07/08/2022    9:22 AM 07/10/2022   11:13 AM 08/03/2022    4:30 PM 08/25/2022   11:49 AM 09/24/2022    1:55 PM  Fall Risk  Falls in the past year?  1 1 1  0  Was there an injury with Fall?  1 1 0 0  Fall Risk Category Calculator  3 3 1  0  Fall Risk Category (Retired)  High      (RETIRED) Patient Fall Risk Level High fall risk  High fall risk      Patient at  Risk for Falls Due to  History of fall(s);Impaired balance/gait;Impaired mobility History of fall(s);Impaired balance/gait;Impaired mobility No Fall Risks   Fall risk Follow up  Falls evaluation completed  Falls evaluation completed Falls evaluation completed      Data saved with a previous flowsheet row definition   Functional Status Survey:    Vitals:   02/24/24 0959  BP: (!) 116/45  Pulse: (!) 54  Temp: (!) 96.7 F (35.9 C)  SpO2: 91%  Weight: 205 lb (93 kg)  Height: 5' 6 (1.676 m)   Body mass index is 33.09 kg/m. Physical Exam Vitals and nursing note reviewed.  Constitutional:      Appearance: Normal appearance.  HENT:     Head: Normocephalic and atraumatic.     Nose: No congestion.     Mouth/Throat:     Mouth: Mucous membranes are moist.     Pharynx: No posterior oropharyngeal erythema.  Eyes:     Extraocular Movements: Extraocular movements intact.     Conjunctiva/sclera: Conjunctivae normal.     Pupils: Pupils are equal, round, and reactive to light.  Cardiovascular:     Rate and Rhythm: Normal rate. Rhythm irregular.     Heart sounds: No murmur heard. Pulmonary:     Effort: Pulmonary effort is normal.     Breath sounds: No rales.     Comments: Central congestion Abdominal:     General: Bowel sounds are normal.  Palpations: Abdomen is soft.     Tenderness: There is no abdominal tenderness.  Musculoskeletal:        General: Tenderness present.     Cervical back: Normal range of motion and neck supple.     Right lower leg: Edema present.     Left lower leg: Edema present.     Comments: trace edema BLE.  Shoulders pain with above shoulder level abduction ROM, worsens at night, R sciatica pain worse in am/getting out of bed, chronic left knee pain  Skin:    General: Skin is warm and dry.     Comments: The right 5th toe nail yellow thick from previous examination   Neurological:     General: No focal deficit present.     Mental Status: He is alert and  oriented to person, place, and time. Mental status is at baseline.     Motor: No weakness.     Gait: Gait abnormal.  Psychiatric:        Mood and Affect: Mood normal.        Behavior: Behavior normal.        Thought Content: Thought content normal.     Labs reviewed: Recent Labs    08/17/23 0000 02/01/24 0000 02/07/24 0000  NA 138 138 137  K 4.7 4.5 4.9  CL 104 103 102  CO2 28* 27* 28*  BUN 17 16 21   CREATININE 1.2 1.3 1.3  CALCIUM 10.5 10.8* 11.1*   Recent Labs    08/10/23 0000 02/01/24 0000 02/07/24 0000  AST 19 23 20   ALT 11 12 14   ALKPHOS 75 101 95  ALBUMIN 3.8 3.8 3.9   Recent Labs    07/15/23 0000 08/10/23 0000 02/01/24 0000 02/07/24 0000  WBC 9.1 9.3 10.5 12.0  NEUTROABS 8,199.00 6,547.00 7,287.00 8,640.00  HGB 11.1* 10.5* 11.6* 11.5*  HCT 34* 32* 37* 35*  PLT 296 347 335  --    Lab Results  Component Value Date   TSH 1.78 02/08/2024   Lab Results  Component Value Date   HGBA1C  08/24/2009    5.7 (NOTE) The ADA recommends the following therapeutic goal for glycemic control related to Hgb A1c measurement: Goal of therapy: <6.5 Hgb A1c  Reference: American Diabetes Association: Clinical Practice Recommendations 2010, Diabetes Care, 2010, 33: (Suppl  1).   Lab Results  Component Value Date   CHOL 116 12/03/2022   HDL 46 12/03/2022   LDLCALC 54 12/03/2022   LDLDIRECT 55 09/02/2020   TRIG 80 12/03/2022   CHOLHDL 3.3 09/02/2020    Significant Diagnostic Results in last 30 days:  No results found.  Assessment/Plan There are no diagnoses linked to this encounter.   Family/ staff Communication: ***  Labs/tests ordered:  ***

## 2024-02-24 NOTE — Assessment & Plan Note (Signed)
 taking Tamsulosin , off Myrbetriq by urology 11/18/23

## 2024-02-24 NOTE — Assessment & Plan Note (Signed)
 followed by psychiatry, on Lithium , TSH 1.78 02/08/24, Lithium  level 0.5(0.6-1.2) 02/08/24             Insomnia, takes Trazodone  since 02/17/23 by psych

## 2024-02-24 NOTE — Assessment & Plan Note (Signed)
 Normalized, off Metoprolol , Diltiazem  2/2 to bradycardia. 08/24/23 38/60 sec with V tach, stopped Metoprolol  per cardiology

## 2024-02-25 ENCOUNTER — Encounter: Payer: Self-pay | Admitting: Nurse Practitioner

## 2024-02-25 DIAGNOSIS — K644 Residual hemorrhoidal skin tags: Secondary | ICD-10-CM | POA: Insufficient documentation

## 2024-02-25 NOTE — Assessment & Plan Note (Signed)
 Noted 4pm, no active bleeding or s/s of injury Reported drops of blood noted in toilet after hard BM Adding MiraLax every other day, continue Colace bid, 2.5% hydrocortisone bid to external hemorrhoids/anal area x 7 days, may repeat as needed.

## 2024-03-15 ENCOUNTER — Other Ambulatory Visit: Payer: Self-pay | Admitting: Orthopedic Surgery

## 2024-03-15 DIAGNOSIS — M150 Primary generalized (osteo)arthritis: Secondary | ICD-10-CM

## 2024-03-15 MED ORDER — HYDROCODONE-ACETAMINOPHEN 5-325 MG PO TABS: 0.5000 | ORAL_TABLET | Freq: Two times a day (BID) | ORAL | 0 refills | Status: DC

## 2024-03-21 ENCOUNTER — Non-Acute Institutional Stay (SKILLED_NURSING_FACILITY): Payer: Self-pay | Admitting: Nurse Practitioner

## 2024-03-21 ENCOUNTER — Encounter: Payer: Self-pay | Admitting: Nurse Practitioner

## 2024-03-21 DIAGNOSIS — Z66 Do not resuscitate: Secondary | ICD-10-CM | POA: Diagnosis not present

## 2024-03-21 DIAGNOSIS — R251 Tremor, unspecified: Secondary | ICD-10-CM | POA: Diagnosis not present

## 2024-03-21 DIAGNOSIS — R350 Frequency of micturition: Secondary | ICD-10-CM

## 2024-03-21 DIAGNOSIS — I4819 Other persistent atrial fibrillation: Secondary | ICD-10-CM

## 2024-03-21 DIAGNOSIS — I10 Essential (primary) hypertension: Secondary | ICD-10-CM

## 2024-03-21 DIAGNOSIS — F0390 Unspecified dementia without behavioral disturbance: Secondary | ICD-10-CM

## 2024-03-21 DIAGNOSIS — L02229 Furuncle of trunk, unspecified: Secondary | ICD-10-CM | POA: Diagnosis not present

## 2024-03-21 NOTE — Assessment & Plan Note (Signed)
 Runs low, only taking diuretics

## 2024-03-21 NOTE — Assessment & Plan Note (Signed)
 Heart rate has been controlled, taking  Eliquis 

## 2024-03-21 NOTE — Assessment & Plan Note (Signed)
 Stable, aking Tamsulosin , off Myrbetriq by urology 11/18/23

## 2024-03-21 NOTE — Assessment & Plan Note (Signed)
 SNF [facility] for supportive care, 07/16/22 MMSE 18/30, taking Deonepezil sine 8/21/2 by Psych, intermittent confusions.

## 2024-03-21 NOTE — Assessment & Plan Note (Signed)
 Euvolemic, EF 45-50% 11/2021, taking Furosemide, trace edema BLE, 05/10/23 CXR-mild pulmonary venous congestion, no infiltrate, followed by cardiology.

## 2024-03-21 NOTE — Assessment & Plan Note (Signed)
 followed by psychiatry, on Lithium , TSH 1.78 02/08/24, Lithium  level 0.5(0.6-1.2) 02/08/24             Insomnia, takes Trazodone  since 02/17/23 by psych

## 2024-03-21 NOTE — Assessment & Plan Note (Signed)
 12/01/22 dc'd Alendronate tscore -2.9 2017, -0.9 01/19/19, refused DEXA 11/02/22

## 2024-03-21 NOTE — Progress Notes (Unsigned)
 Location:  [facility] Room Number: N018-A Place of Service:  SNF (31) Provider: [facility] Keilon Ressel, N.P.   Patient Care Team: Pcp, No as PCP - General Bernie Lamar PARAS, MD as PCP - Cardiology (Cardiology)  Extended Emergency Contact Information Primary Emergency Contact: Hijazi,[name] Address: [phone]           [facility] [address]          [place], [place] [address] [place] Home Phone: [phone] Mobile Phone: [phone] Relation: Spouse Secondary Emergency Contact: Forrey,Andy Address: [address]          [place], [place] [address] [place] Home Phone: [phone] Mobile Phone: [phone] Relation: Son  Code Status:  DNR Goals of care: Advanced Directive information    01/06/2024   10:20 AM  Advanced Directives  Does Patient Have a Medical Advance Directive? Yes  Type of Advance Directive Out of facility DNR (pink MOST or yellow form);Healthcare Power of Attorney  Does patient want to make changes to medical advance directive? No - Patient declined  Copy of Healthcare Power of Attorney in Chart? No - copy requested     Chief Complaint  Patient presents with   Boil on Back     HPI:  Pt is a 89 y.o. male seen today for an acute visit for opened boil right upper back, redness periwound, pain when the area pressed Urinary frequency, taking Tamsulosin , off Myrbetriq by urology 11/18/23              HOH L>R, HPOA-wife declined hearing aids.              Parkinson's disease, stopped parkinson's medication because of affected his cognition, had resting tremor in left arm/leg in the past             OP 12/01/22 dc'd Alendronate tscore -2.9 2017, -0.9 01/19/19, refused DEXA 11/02/22             OA shoulders pain, worsens at night, limited abduction ROM over shoulder level, hx of neck clicks with ROM, aches, c/o right sciatica pain, worsens in am, chronic left knee pain,  Norco and Gabapentin for pain  control.              Cognitive impairment, SNF [facility] for supportive care, 07/16/22 MMSE 18/30, taking Deonepezil sine 8/21/2 by Psych, intermittent confusions.              CVA, no focal weakness residual.              Afib, taking  Eliquis , TSH 1.78 02/08/24             Bradycardia, off Metoprolol , Diltiazem  2/2 to bradycardia. 08/24/23 38/60 sec with V tach, stopped Metoprolol  per cardiology             Bipolar disorder, followed by psychiatry, on Lithium , TSH 1.78 02/08/24, Lithium  level 0.5(0.6-1.2) 02/08/24             Insomnia, takes Trazodone  since 02/17/23 by psych             CHF, EF 45-50% 11/2021, taking Furosemide, trace edema BLE, 05/10/23 CXR-mild pulmonary venous congestion, no infiltrate, followed by cardiology.              Gait abnormality, uses w/c for mobility              Hyponatremia, Na 137 02/07/24  Hyperbilirubinemia, bilirubin 0.7 02/07/24            Polycythemia,  Hgb 11.5 02/07/24             CKD, Bun/creat 21/1.3 02/07/24             Hypogonadism, hx of  testosterone  inj, dc'd by urology 11/18/23             Hyperlipidemia, takes Pravastatin              Glaucoma, eye drops.     Past Medical History:  Diagnosis Date   AF (paroxysmal atrial fibrillation) (HCC) 02/16/2018   Atrial fibrillation (HCC)    Atrial fibrillation with RVR (HCC) 02/16/2018   Bipolar 1 disorder (HCC)    Bipolar disorder (HCC) 02/17/2018   Dizziness 02/26/2020   DJD (degenerative joint disease)    Dyslipidemia 02/26/2020   Gait instability 02/16/2018   Hypertension    Mitral regurgitation    mild    Polycythemia 02/16/2018   Renal insufficiency    Stroke [facility])    Tremor 02/16/2018   Tremors of nervous system 01/2018   Past Surgical History:  Procedure Laterality Date   SHOULDER SURGERY      Allergies  Allergen Reactions   Lipitor [Atorvastatin] Other (See Comments)    Joint soreness    Outpatient Encounter Medications as of 03/21/2024  Medication Sig   ascorbic acid  (VITAMIN C )  500 MG tablet Take 500 mg by mouth daily.   Calcium Carbonate-Vit D-Min (CALCIUM 600+D PLUS MINERALS) 600-400 MG-UNIT CHEW Chew 1 Piece by mouth in the morning and at bedtime.   docusate sodium  (COLACE) 100 MG capsule Take 1 capsule (100 mg total) by mouth 2 (two) times daily.   donepezil  (ARICEPT ) 5 MG tablet Take 5 mg by mouth at bedtime.   ELIQUIS  5 MG TABS tablet TAKE 1 TABLET BY MOUTH TWICE DAILY.   furosemide (LASIX) 20 MG tablet Take 20 mg by mouth daily.   gabapentin (NEURONTIN) 100 MG capsule Take 200 mg by mouth at bedtime.   HYDROcodone -acetaminophen  (NORCO/VICODIN) 5-325 MG tablet Take 0.5 tablets by mouth in the morning and at bedtime.   latanoprost  (XALATAN ) 0.005 % ophthalmic solution Place 1 drop into both eyes at bedtime.   lithium  carbonate (LITHOBID ) 300 MG ER tablet Take 1 tablet (300 mg total) by mouth every evening.   Multiple Vitamin (MULTIVITAMIN WITH MINERALS) TABS tablet Take 1 tablet by mouth daily.   Polyethyl Glycol-Propyl Glycol (SYSTANE) 0.4-0.3 % SOLN Apply 1 drop to eye every 4 (four) hours as needed (dry eyes). And four times a day scheduled   potassium chloride (MICRO-K) 10 MEQ CR capsule Take 10 mEq by mouth daily.   pravastatin  (PRAVACHOL ) 20 MG tablet Take 20 mg by mouth at bedtime.   sodium chloride  (MURO 128) 5 % ophthalmic solution Place 1 drop into both eyes at bedtime.   tamsulosin  (FLOMAX ) 0.4 MG CAPS capsule Take 1 capsule (0.4 mg total) by mouth daily.   timolol  (BETIMOL ) 0.5 % ophthalmic solution Place 1 drop into both eyes daily.   traZODone  (DESYREL ) 50 MG tablet Take 1 tablet (50 mg total) by mouth at bedtime.   No facility-administered encounter medications on file as of 03/21/2024.    Review of Systems  Constitutional:  Negative for appetite change, fatigue and fever.  HENT:  Positive for hearing loss. Negative for congestion and trouble swallowing.   Eyes:  Negative for visual disturbance.  Respiratory:  Positive for cough. Negative for  chest tightness and wheezing.   Cardiovascular:  Positive for leg swelling.  Gastrointestinal:  Negative for abdominal pain and constipation.  Genitourinary:  Negative for dysuria and urgency.       Incontinent of urine.   Musculoskeletal:  Positive for arthralgias, back pain and gait problem.       Shoulders pain with above shoulder level abduction ROM, worsens at night. R sciatic pain, not new, worse in am when getting up, chronic left knee pain-worsened pain now Worsened left neck/shoulder pain /tingling, numbness arm, no decreased muscle strength.   Skin:  Positive for wound.  Neurological:  Negative for speech difficulty, weakness and headaches.  Psychiatric/Behavioral:  Positive for confusion. Negative for sleep disturbance. The patient is not nervous/anxious.        Early am awake, chronic     Immunization History  Administered Date(s) Administered   Hepatitis B, ADULT 11/26/1997, 12/27/1997, 05/08/1998   INFLUENZA, HIGH DOSE SEASONAL PF 03/31/2023   Influenza-Unspecified 04/15/2022   Moderna Covid-19 Vaccine Bivalent Booster 70yrs & up 04/14/2023   Moderna Sars-Covid-2 Vaccination 07/03/2019, 07/31/2019, 05/13/2020, 03/19/2021   PPD Test 07/08/2022   Pneumococcal Conjugate-13 04/26/2014   Pneumococcal Polysaccharide-23 04/14/2005   RSV,unspecified 08/19/2022   Td 08/04/2022   Tdap 05/18/2016   Tetanus 08/04/2022   Zoster Recombinant(Shingrix) 07/29/2022, 10/28/2022   Pertinent  Health Maintenance Due  Topic Date Due   Influenza Vaccine  01/28/2024      07/08/2022    9:22 AM 07/10/2022   11:13 AM 08/03/2022    4:30 PM 08/25/2022   11:49 AM 09/24/2022    1:55 PM  Fall Risk  Falls in the past year?  1 1 1  0  Was there an injury with Fall?  1 1 0 0  Fall Risk Category Calculator  3 3 1  0  Fall Risk Category (Retired)  High      (RETIRED) Patient Fall Risk Level High fall risk  High fall risk      Patient at Risk for Falls Due to  History of fall(s);Impaired  balance/gait;Impaired mobility History of fall(s);Impaired balance/gait;Impaired mobility No Fall Risks   Fall risk Follow up  Falls evaluation completed  Falls evaluation completed Falls evaluation completed      Data saved with a previous flowsheet row definition   Functional Status Survey:    Vitals:   03/21/24 1128 03/22/24 1138  BP: (!) 107/52 (!) 146/66  Pulse: 78   SpO2: 92%   Weight: 209 lb 8 oz (95 kg)   Height: 5' 6 (1.676 m)    Body mass index is 33.81 kg/m. Physical Exam Vitals and nursing note reviewed.  Constitutional:      Appearance: Normal appearance.  HENT:     Head: Normocephalic and atraumatic.     Nose: No congestion.     Mouth/Throat:     Mouth: Mucous membranes are moist.     Pharynx: No posterior oropharyngeal erythema.  Eyes:     Extraocular Movements: Extraocular movements intact.     Conjunctiva/sclera: Conjunctivae normal.     Pupils: Pupils are equal, round, and reactive to light.  Cardiovascular:     Rate and Rhythm: Normal rate. Rhythm irregular.     Heart sounds: No murmur heard. Pulmonary:     Effort: Pulmonary effort is normal.     Breath sounds: No rales.     Comments: Central congestion Abdominal:     General: Bowel sounds are normal.     Palpations: Abdomen is soft.  Tenderness: There is no abdominal tenderness.  Genitourinary:    Comments: External hemorrhoids 4pm, no active bleeding or s/s of injury Musculoskeletal:        General: Tenderness present.     Cervical back: Normal range of motion and neck supple.     Right lower leg: Edema present.     Left lower leg: Edema present.     Comments: trace edema BLE.  Shoulders pain with above shoulder level abduction ROM, worsens at night, R sciatica pain worse in am/getting out of bed, chronic left knee pain  Skin:    General: Skin is warm and dry.     Comments: The right 5th toe nail yellow thick from previous examination Right upper back opened boil with redness in the  area, tender to touch   Neurological:     General: No focal deficit present.     Mental Status: He is alert and oriented to person, place, and time. Mental status is at baseline.     Motor: No weakness.     Gait: Gait abnormal.  Psychiatric:        Mood and Affect: Mood normal.        Behavior: Behavior normal.        Thought Content: Thought content normal.     Labs reviewed: Recent Labs    08/17/23 0000 02/01/24 0000 02/07/24 0000  NA 138 138 137  K 4.7 4.5 4.9  CL 104 103 102  CO2 28* 27* 28*  BUN 17 16 21   CREATININE 1.2 1.3 1.3  CALCIUM 10.5 10.8* 11.1*   Recent Labs    08/10/23 0000 02/01/24 0000 02/07/24 0000  AST 19 23 20   ALT 11 12 14   ALKPHOS 75 101 95  ALBUMIN 3.8 3.8 3.9   Recent Labs    07/15/23 0000 08/10/23 0000 02/01/24 0000 02/07/24 0000  WBC 9.1 9.3 10.5 12.0  NEUTROABS 8,199.00 6,547.00 7,287.00 8,640.00  HGB 11.1* 10.5* 11.6* 11.5*  HCT 34* 32* 37* 35*  PLT 296 347 335  --    Lab Results  Component Value Date   TSH 1.78 02/08/2024   Lab Results  Component Value Date   HGBA1C  08/24/2009    5.7 (NOTE) The ADA recommends the following therapeutic goal for glycemic control related to Hgb A1c measurement: Goal of therapy: <6.5 Hgb A1c  Reference: American Diabetes Association: Clinical Practice Recommendations 2010, Diabetes Care, 2010, 33: (Suppl  1).   Lab Results  Component Value Date   CHOL 116 12/03/2022   HDL 46 12/03/2022   LDLCALC 54 12/03/2022   LDLDIRECT 55 09/02/2020   TRIG 80 12/03/2022   CHOLHDL 3.3 09/02/2020    Significant Diagnostic Results in last 30 days:  No results found.  Assessment/Plan Boil of trunk opened boil right upper back, redness periwound, pain when the area pressed, apply Bactroban ointment daily until healed  Urinary frequency Stable, aking Tamsulosin , off Myrbetriq by urology 11/18/23  Tremors of nervous system  stopped parkinson's medication because of affected his cognition, had  resting tremor in left arm/leg in the past  Osteoporosis 12/01/22 dc'd Alendronate tscore -2.9 2017, -0.9 01/19/19, refused DEXA 11/02/22  Osteoarthritis, multiple sites shoulders pain, worsens at night, limited abduction ROM over shoulder level, hx of neck clicks with ROM, aches, c/o right sciatica pain, worsens in am, chronic left knee pain,  Norco and Gabapentin for pain control.   Major neurocognitive disorder [facility]) SNF [facility] for supportive care, 07/16/22 MMSE 18/30, taking Deonepezil sine  8/21/2 by Psych, intermittent confusions.   Atrial fibrillation (HCC) Heart rate has been controlled, taking  Eliquis   Bipolar disorder (HCC)  followed by psychiatry, on Lithium , TSH 1.78 02/08/24, Lithium  level 0.5(0.6-1.2) 02/08/24             Insomnia, takes Trazodone  since 02/17/23 by psych  Chronic CHF (HCC) Euvolemic,  EF 45-50% 11/2021, taking Furosemide, trace edema BLE, 05/10/23 CXR-mild pulmonary venous congestion, no infiltrate, followed by cardiology.   Hypertension Blood pressure is fluctuating, only taking diuretics    Family/ staff Communication: Plan of care reviewed with the patient and charge nurse  Labs/tests ordered: None

## 2024-03-21 NOTE — Assessment & Plan Note (Signed)
 stopped parkinson's medication because of affected his cognition, had resting tremor in left arm/leg in the past

## 2024-03-21 NOTE — Assessment & Plan Note (Signed)
 opened boil right upper back, redness periwound, pain when the area pressed, apply Bactroban ointment daily until healed

## 2024-03-21 NOTE — Assessment & Plan Note (Signed)
 shoulders pain, worsens at night, limited abduction ROM over shoulder level, hx of neck clicks with ROM, aches, c/o right sciatica pain, worsens in am, chronic left knee pain,  Norco and Gabapentin for pain control.

## 2024-03-27 ENCOUNTER — Non-Acute Institutional Stay (SKILLED_NURSING_FACILITY): Payer: Self-pay | Admitting: Sports Medicine

## 2024-03-27 DIAGNOSIS — F319 Bipolar disorder, unspecified: Secondary | ICD-10-CM | POA: Diagnosis not present

## 2024-03-27 DIAGNOSIS — I4891 Unspecified atrial fibrillation: Secondary | ICD-10-CM | POA: Diagnosis not present

## 2024-03-27 DIAGNOSIS — F0390 Unspecified dementia without behavioral disturbance: Secondary | ICD-10-CM | POA: Diagnosis not present

## 2024-03-27 DIAGNOSIS — N342 Other urethritis: Secondary | ICD-10-CM

## 2024-03-27 NOTE — Progress Notes (Unsigned)
 Provider:  Dr. Jackalyn Blazing Location:  [facility] Place of Service:   Skilled care   PCP: Pcp, No Patient Care Team: Pcp, No as PCP - General Bernie Lamar PARAS, MD as PCP - Cardiology (Cardiology)  Extended Emergency Contact Information Primary Emergency Contact: Stuard,[name] Address: [phone]           [facility] [address]          [place], [place] [address] [place] Home Phone: [phone] Mobile Phone: [phone] Relation: Spouse Secondary Emergency Contact: Kouba,Andy Address: [address].          [place], [place] [address] [place] Home Phone: [phone] Mobile Phone: [phone] Relation: Son  Goals of Care: Advanced Directive information    01/06/2024   10:20 AM  Advanced Directives  Does Patient Have a Medical Advance Directive? Yes  Type of Advance Directive Out of facility DNR (pink MOST or yellow form);Healthcare Power of Attorney  Does patient want to make changes to medical advance directive? No - Patient declined  Copy of Healthcare Power of Attorney in Chart? No - copy requested      No chief complaint on file.   Discussed the use of AI scribe software for clinical note transcription with the patient, who gave verbal consent to proceed.  History of Present Illness      Past Medical History:  Diagnosis Date   AF (paroxysmal atrial fibrillation) (HCC) 02/16/2018   Atrial fibrillation (HCC)    Atrial fibrillation with RVR (HCC) 02/16/2018   Bipolar 1 disorder (HCC)    Bipolar disorder (HCC) 02/17/2018   Dizziness 02/26/2020   DJD (degenerative joint disease)    Dyslipidemia 02/26/2020   Gait instability 02/16/2018   Hypertension    Mitral regurgitation    mild    Polycythemia 02/16/2018   Renal insufficiency    Stroke [facility])    Tremor 02/16/2018   Tremors of nervous system 01/2018   Past Surgical History:  Procedure Laterality Date   SHOULDER SURGERY      reports  that he has quit smoking. He has never used smokeless tobacco. He reports current alcohol use of about 1.0 standard drink of alcohol per week. He reports that he does not use drugs. Social History   Socioeconomic History   Marital status: Married    Spouse name: Not on file   Number of children: Not on file   Years of education: Not on file   Highest education level: Not on file  Occupational History   Not on file  Tobacco Use   Smoking status: Former   Smokeless tobacco: Never  Vaping Use   Vaping status: Never Used  Substance and Sexual Activity   Alcohol use: Yes    Alcohol/week: 1.0 standard drink of alcohol    Types: 1 Glasses of wine per week    Comment: 1 per month   Drug use: No   Sexual activity: Not on file  Other Topics Concern   Not on file  Social History Narrative   Not on file   Social Drivers of Health   Financial Resource Strain: Not on file  Food Insecurity: No Food Insecurity (07/02/2022)   Hunger Vital Sign    Worried About Running Out of Food in the Last Year: Never true    Ran Out of Food in the Last Year: Never true  Transportation Needs: No Transportation Needs (07/02/2022)   PRAPARE - Transportation    Lack  of Transportation (Medical): No    Lack of Transportation (Non-Medical): No  Physical Activity: Not on file  Stress: Not on file  Social Connections: Not on file  Intimate Partner Violence: Not At Risk (07/02/2022)   Humiliation, Afraid, Rape, and Kick questionnaire    Fear of Current or Ex-Partner: No    Emotionally Abused: No    Physically Abused: No    Sexually Abused: No    Functional Status Survey:    Family History  Problem Relation Age of Onset   Alcohol abuse Mother    Cancer Father     Health Maintenance  Topic Date Due   Influenza Vaccine  01/28/2024   COVID-19 Vaccine (6 - 2025-26 season) 02/28/2024   Medicare Annual Wellness (AWV)  07/26/2024   DTaP/Tdap/Td (4 - Td or Tdap) 08/04/2032   Pneumococcal Vaccine: 50+ Years   Completed   Zoster Vaccines- Shingrix  Completed   HPV VACCINES  Aged Out   Meningococcal B Vaccine  Aged Out   Hepatitis B Vaccines 19-59 Average Risk  Discontinued    Allergies  Allergen Reactions   Lipitor [Atorvastatin] Other (See Comments)    Joint soreness    Outpatient Encounter Medications as of 03/27/2024  Medication Sig   ascorbic acid  (VITAMIN C ) 500 MG tablet Take 500 mg by mouth daily.   Calcium Carbonate-Vit D-Min (CALCIUM 600+D PLUS MINERALS) 600-400 MG-UNIT CHEW Chew 1 Piece by mouth in the morning and at bedtime.   docusate sodium  (COLACE) 100 MG capsule Take 1 capsule (100 mg total) by mouth 2 (two) times daily.   donepezil  (ARICEPT ) 5 MG tablet Take 5 mg by mouth at bedtime.   ELIQUIS  5 MG TABS tablet TAKE 1 TABLET BY MOUTH TWICE DAILY.   furosemide (LASIX) 20 MG tablet Take 20 mg by mouth daily.   gabapentin (NEURONTIN) 100 MG capsule Take 200 mg by mouth at bedtime.   HYDROcodone -acetaminophen  (NORCO/VICODIN) 5-325 MG tablet Take 0.5 tablets by mouth in the morning and at bedtime.   latanoprost  (XALATAN ) 0.005 % ophthalmic solution Place 1 drop into both eyes at bedtime.   lithium  carbonate (LITHOBID ) 300 MG ER tablet Take 1 tablet (300 mg total) by mouth every evening.   Multiple Vitamin (MULTIVITAMIN WITH MINERALS) TABS tablet Take 1 tablet by mouth daily.   Polyethyl Glycol-Propyl Glycol (SYSTANE) 0.4-0.3 % SOLN Apply 1 drop to eye every 4 (four) hours as needed (dry eyes). And four times a day scheduled   potassium chloride (MICRO-K) 10 MEQ CR capsule Take 10 mEq by mouth daily.   pravastatin  (PRAVACHOL ) 20 MG tablet Take 20 mg by mouth at bedtime.   sodium chloride  (MURO 128) 5 % ophthalmic solution Place 1 drop into both eyes at bedtime.   tamsulosin  (FLOMAX ) 0.4 MG CAPS capsule Take 1 capsule (0.4 mg total) by mouth daily.   timolol  (BETIMOL ) 0.5 % ophthalmic solution Place 1 drop into both eyes daily.   traZODone  (DESYREL ) 50 MG tablet Take 1 tablet (50 mg  total) by mouth at bedtime.   No facility-administered encounter medications on file as of 03/27/2024.    Review of Systems Negative unless indicated in HPI.  There were no vitals filed for this visit. There is no height or weight on file to calculate BMI. BP Readings from Last 3 Encounters:  03/22/24 (!) 146/66  02/24/24 (!) 116/45  02/07/24 (!) 100/57   Wt Readings from Last 3 Encounters:  03/21/24 209 lb 8 oz (95 kg)  02/24/24 205 lb (93  kg)  02/07/24 205 lb (93 kg)   Physical Exam  Labs reviewed: Basic Metabolic Panel: Recent Labs    08/17/23 0000 02/01/24 0000 02/07/24 0000  NA 138 138 137  K 4.7 4.5 4.9  CL 104 103 102  CO2 28* 27* 28*  BUN 17 16 21   CREATININE 1.2 1.3 1.3  CALCIUM 10.5 10.8* 11.1*   Liver Function Tests: Recent Labs    08/10/23 0000 02/01/24 0000 02/07/24 0000  AST 19 23 20   ALT 11 12 14   ALKPHOS 75 101 95  ALBUMIN 3.8 3.8 3.9   No results for input(s): LIPASE, AMYLASE in the last 8760 hours. No results for input(s): AMMONIA in the last 8760 hours. CBC: Recent Labs    07/15/23 0000 08/10/23 0000 02/01/24 0000 02/07/24 0000  WBC 9.1 9.3 10.5 12.0  NEUTROABS 8,199.00 6,547.00 7,287.00 8,640.00  HGB 11.1* 10.5* 11.6* 11.5*  HCT 34* 32* 37* 35*  PLT 296 347 335  --    Cardiac Enzymes: No results for input(s): CKTOTAL, CKMB, CKMBINDEX, TROPONINI in the last 8760 hours. BNP: Invalid input(s): POCBNP Lab Results  Component Value Date   HGBA1C  08/24/2009    5.7 (NOTE) The ADA recommends the following therapeutic goal for glycemic control related to Hgb A1c measurement: Goal of therapy: <6.5 Hgb A1c  Reference: American Diabetes Association: Clinical Practice Recommendations 2010, Diabetes Care, 2010, 33: (Suppl  1).   Lab Results  Component Value Date   TSH 1.78 02/08/2024   Lab Results  Component Value Date   VITAMINB12 278 07/02/2022   No results found for: FOLATE No results found for: IRON,  TIBC, FERRITIN  Imaging and Procedures obtained prior to SNF admission: CT Head Wo Contrast Result Date: 07/01/2022 CLINICAL DATA:  Head and neck trauma. EXAM: CT HEAD WITHOUT CONTRAST CT CERVICAL SPINE WITHOUT CONTRAST TECHNIQUE: Multidetector CT imaging of the head and cervical spine was performed following the standard protocol without intravenous contrast. Multiplanar CT image reconstructions of the cervical spine were also generated. RADIATION DOSE REDUCTION: This exam was performed according to the departmental dose-optimization program which includes automated exposure control, adjustment of the mA and/or kV according to patient size and/or use of iterative reconstruction technique. COMPARISON:  CT examination dated February 16, 2018 FINDINGS: CT HEAD FINDINGS Brain: No evidence of acute infarction, hemorrhage, extra-axial collection or mass lesion/mass effect. Prominence of the ventricles and sulci secondary to moderate cerebral volume loss. Diffuse low-attenuation of the periventricular white matter presumed advanced chronic microvascular ischemic changes of the white matter. Prominence of the ventricles disproportionate to the volume loss concerning for normal pressure hydrocephalus. Vascular: No hyperdense vessel or unexpected calcification. Skull: Normal. Negative for fracture or focal lesion. Sinuses/Orbits: No acute finding. Other: None. CT CERVICAL SPINE FINDINGS Alignment: Straightening of the cervical spine. Skull base and vertebrae: No acute fracture. No primary bone lesion or focal pathologic process. Soft tissues and spinal canal: No prevertebral fluid or swelling. No visible canal hematoma. Disc levels: Multilevel degenerate disc disease with disc height loss and marginal osteophytes with associated uncovertebral joint and facet joint arthropathy. C2-C3:  No significant findings. C3-C4: Disc height loss and uncovertebral joint arthropathy with mild right and moderate left neural foraminal  stenosis. Mild left facet joint arthropathy. C4-C5: Disc height loss and marked uncovertebral joint arthropathy with moderate-to-severe bilateral neural foraminal stenosis. C5-C6: Disc height loss and uncovertebral joint arthropathy with moderate right and severe left neural foraminal stenosis. C6-C7: Disc height loss and uncovertebral joint arthropathy with mild left  neural foraminal stenosis. C7-T1: Disc height loss and facet joint arthropathy. No significant neural foraminal stenosis. Upper chest: Negative. Other: None IMPRESSION: CT HEAD: 1. No acute intracranial abnormality. 2. Moderate cerebral volume loss and advanced chronic microvascular ischemic changes of the white matter. 3. Prominence of the ventricles more than expected for the cerebral volume loss concerning for normal pressure hydrocephalus. CT CERVICAL SPINE: 1. No acute fracture or traumatic subluxation. 2. Multilevel degenerate disc disease with associated uncovertebral joint and facet joint arthropathy. Electronically Signed   By: Imran  Ahmed D.O.   On: 07/01/2022 23:25   CT Cervical Spine Wo Contrast Result Date: 07/01/2022 CLINICAL DATA:  Head and neck trauma. EXAM: CT HEAD WITHOUT CONTRAST CT CERVICAL SPINE WITHOUT CONTRAST TECHNIQUE: Multidetector CT imaging of the head and cervical spine was performed following the standard protocol without intravenous contrast. Multiplanar CT image reconstructions of the cervical spine were also generated. RADIATION DOSE REDUCTION: This exam was performed according to the departmental dose-optimization program which includes automated exposure control, adjustment of the mA and/or kV according to patient size and/or use of iterative reconstruction technique. COMPARISON:  CT examination dated February 16, 2018 FINDINGS: CT HEAD FINDINGS Brain: No evidence of acute infarction, hemorrhage, extra-axial collection or mass lesion/mass effect. Prominence of the ventricles and sulci secondary to moderate cerebral  volume loss. Diffuse low-attenuation of the periventricular white matter presumed advanced chronic microvascular ischemic changes of the white matter. Prominence of the ventricles disproportionate to the volume loss concerning for normal pressure hydrocephalus. Vascular: No hyperdense vessel or unexpected calcification. Skull: Normal. Negative for fracture or focal lesion. Sinuses/Orbits: No acute finding. Other: None. CT CERVICAL SPINE FINDINGS Alignment: Straightening of the cervical spine. Skull base and vertebrae: No acute fracture. No primary bone lesion or focal pathologic process. Soft tissues and spinal canal: No prevertebral fluid or swelling. No visible canal hematoma. Disc levels: Multilevel degenerate disc disease with disc height loss and marginal osteophytes with associated uncovertebral joint and facet joint arthropathy. C2-C3:  No significant findings. C3-C4: Disc height loss and uncovertebral joint arthropathy with mild right and moderate left neural foraminal stenosis. Mild left facet joint arthropathy. C4-C5: Disc height loss and marked uncovertebral joint arthropathy with moderate-to-severe bilateral neural foraminal stenosis. C5-C6: Disc height loss and uncovertebral joint arthropathy with moderate right and severe left neural foraminal stenosis. C6-C7: Disc height loss and uncovertebral joint arthropathy with mild left neural foraminal stenosis. C7-T1: Disc height loss and facet joint arthropathy. No significant neural foraminal stenosis. Upper chest: Negative. Other: None IMPRESSION: CT HEAD: 1. No acute intracranial abnormality. 2. Moderate cerebral volume loss and advanced chronic microvascular ischemic changes of the white matter. 3. Prominence of the ventricles more than expected for the cerebral volume loss concerning for normal pressure hydrocephalus. CT CERVICAL SPINE: 1. No acute fracture or traumatic subluxation. 2. Multilevel degenerate disc disease with associated uncovertebral  joint and facet joint arthropathy. Electronically Signed   By: Imran  Ahmed D.O.   On: 07/01/2022 23:25   DG Chest 1 View Result Date: 07/01/2022 CLINICAL DATA:  Altered mental status. EXAM: CHEST  1 VIEW COMPARISON:  Chest x-ray 09/06/2013 FINDINGS: The heart and mediastinal contours are unchanged. Aortic calcification. No focal consolidation. No pulmonary edema. No pleural effusion. No pneumothorax. No acute osseous abnormality. Severe degenerative changes of the bilateral shoulders. IMPRESSION: No active disease. Electronically Signed   By: Morgane  Naveau M.D.   On: 07/01/2022 22:44    Assessment and Plan Assessment & Plan  Family/ staff Communication:   Labs/tests ordered:  Jackalyn Blazing

## 2024-03-28 DIAGNOSIS — I5042 Chronic combined systolic (congestive) and diastolic (congestive) heart failure: Secondary | ICD-10-CM | POA: Diagnosis not present

## 2024-03-28 DIAGNOSIS — N390 Urinary tract infection, site not specified: Secondary | ICD-10-CM | POA: Diagnosis not present

## 2024-03-30 ENCOUNTER — Encounter: Payer: Self-pay | Admitting: Sports Medicine

## 2024-04-04 DIAGNOSIS — K080 Exfoliation of teeth due to systemic causes: Secondary | ICD-10-CM | POA: Diagnosis not present

## 2024-04-10 ENCOUNTER — Non-Acute Institutional Stay (SKILLED_NURSING_FACILITY): Payer: Self-pay | Admitting: Sports Medicine

## 2024-04-10 ENCOUNTER — Encounter: Payer: Self-pay | Admitting: Sports Medicine

## 2024-04-10 DIAGNOSIS — F319 Bipolar disorder, unspecified: Secondary | ICD-10-CM

## 2024-04-10 DIAGNOSIS — I509 Heart failure, unspecified: Secondary | ICD-10-CM

## 2024-04-10 DIAGNOSIS — I480 Paroxysmal atrial fibrillation: Secondary | ICD-10-CM

## 2024-04-10 DIAGNOSIS — M150 Primary generalized (osteo)arthritis: Secondary | ICD-10-CM

## 2024-04-10 DIAGNOSIS — G629 Polyneuropathy, unspecified: Secondary | ICD-10-CM | POA: Diagnosis not present

## 2024-04-10 DIAGNOSIS — F0390 Unspecified dementia without behavioral disturbance: Secondary | ICD-10-CM

## 2024-04-10 NOTE — Progress Notes (Signed)
 Location:  [facility] Room Number: [facility] [phone] Place of Service:  SNF (31) Provider:  Dr. Sherlynn     Patient Care Team: Pcp, No as PCP - General Bernie Lamar PARAS, MD as PCP - Cardiology (Cardiology)  Extended Emergency Contact Information Primary Emergency Contact: Hue,[name] Address: [phone].           [facility] [address]          [place], [place] [address] [place] Home Phone: [phone] Mobile Phone: [phone] Relation: Spouse Secondary Emergency Contact: Wakeley,Andy Address: [address].          [place], [place] [address] [place] Home Phone: [phone] Mobile Phone: [phone] Relation: Son  Code Status:  DNR Goals of care: Advanced Directive information    04/10/2024   11:01 AM  Advanced Directives  Does Patient Have a Medical Advance Directive? Yes  Type of Advance Directive Out of facility DNR (pink MOST or yellow form)  Does patient want to make changes to medical advance directive? No - Patient declined     Chief Complaint  Patient presents with   Medical Management of Chronic Issues    Medical Management of Chronic Issues.     HPI:  Pt is a 89 y.o. male with PMH of  Major neurocognitive disorder, OA, A fib, bipolar disorder, Insomnia, CKD, Hyponatremia is seen today for medical management of chronic diseases.    Pt seen and examined in his room, He is sitting in his wheel chair, seems pleasant and comfortable  Pt c/o multiple joint pains C/o pain in both his shoulders, knees  Recently completed antibiotics for urethritis Denies lower abdominal pain, nausea, vomiting, urethral drainage As per nursing staff no major concerns  H/o Bipolar disorder Follows with psychiatry  Currently on lithium  300 mg     Past Medical History:  Diagnosis Date   AF (paroxysmal atrial fibrillation) (HCC) 02/16/2018   Atrial fibrillation (HCC)    Atrial fibrillation with  RVR (HCC) 02/16/2018   Bipolar 1 disorder (HCC)    Bipolar disorder (HCC) 02/17/2018   Dizziness 02/26/2020   DJD (degenerative joint disease)    Dyslipidemia 02/26/2020   Gait instability 02/16/2018   Hypertension    Mitral regurgitation    mild    Polycythemia 02/16/2018   Renal insufficiency    Stroke [facility])    Tremor 02/16/2018   Tremors of nervous system 01/2018   Past Surgical History:  Procedure Laterality Date   SHOULDER SURGERY      Allergies  Allergen Reactions   Lipitor [Atorvastatin] Other (See Comments)    Joint soreness    Outpatient Encounter Medications as of 04/10/2024  Medication Sig   ascorbic acid  (VITAMIN C ) 500 MG tablet Take 500 mg by mouth daily.   Calcium Carbonate-Vit D-Min (CALCIUM 600+D PLUS MINERALS) 600-400 MG-UNIT CHEW Chew 1 Piece by mouth in the morning and at bedtime.   docusate sodium  (COLACE) 100 MG capsule Take 1 capsule (100 mg total) by mouth 2 (two) times daily.   donepezil  (ARICEPT ) 5 MG tablet Take 5 mg by mouth at bedtime.   ELIQUIS  5 MG TABS tablet TAKE 1 TABLET BY MOUTH TWICE DAILY.   furosemide (LASIX) 20 MG tablet Take 20 mg by mouth daily.   gabapentin (NEURONTIN) 100 MG capsule Take 200 mg by mouth at bedtime.   HYDROcodone -acetaminophen  (NORCO/VICODIN) 5-325 MG tablet Take 0.5 tablets by mouth in the morning and at bedtime.  latanoprost  (XALATAN ) 0.005 % ophthalmic solution Place 1 drop into both eyes at bedtime.   lithium  carbonate (LITHOBID ) 300 MG ER tablet Take 1 tablet (300 mg total) by mouth every evening.   Multiple Vitamin (MULTIVITAMIN WITH MINERALS) TABS tablet Take 1 tablet by mouth daily.   mupirocin ointment (BACTROBAN) 2 % 1 Application daily.   Polyethyl Glycol-Propyl Glycol (SYSTANE) 0.4-0.3 % SOLN Apply 1 drop to eye every 4 (four) hours as needed (dry eyes). And four times a day scheduled   potassium chloride (MICRO-K) 10 MEQ CR capsule Take 10 mEq by mouth daily.   pravastatin  (PRAVACHOL ) 20 MG tablet Take 20 mg  by mouth at bedtime.   sodium chloride  (MURO 128) 5 % ophthalmic solution Place 1 drop into both eyes at bedtime.   tamsulosin  (FLOMAX ) 0.4 MG CAPS capsule Take 1 capsule (0.4 mg total) by mouth daily.   timolol  (BETIMOL ) 0.5 % ophthalmic solution Place 1 drop into both eyes daily.   traZODone  (DESYREL ) 50 MG tablet Take 1 tablet (50 mg total) by mouth at bedtime.   No facility-administered encounter medications on file as of 04/10/2024.    Review of Systems  Immunization History  Administered Date(s) Administered   Hepatitis B, ADULT 11/26/1997, 12/27/1997, 05/08/1998   INFLUENZA, HIGH DOSE SEASONAL PF 03/31/2023   Influenza-Unspecified 04/15/2022, 04/04/2024   Moderna Covid-19 Vaccine Bivalent Booster 73yrs & up 04/14/2023   Moderna Sars-Covid-2 Vaccination 07/03/2019, 07/31/2019, 05/13/2020, 03/19/2021   PPD Test 07/08/2022   Pneumococcal Conjugate-13 04/26/2014   Pneumococcal Polysaccharide-23 04/14/2005   RSV,unspecified 08/19/2022   Td 08/04/2022   Tdap 05/18/2016   Tetanus 08/04/2022   Zoster Recombinant(Shingrix) 07/29/2022, 10/28/2022   Pertinent  Health Maintenance Due  Topic Date Due   Influenza Vaccine  Completed      07/08/2022    9:22 AM 07/10/2022   11:13 AM 08/03/2022    4:30 PM 08/25/2022   11:49 AM 09/24/2022    1:55 PM  Fall Risk  Falls in the past year?  1 1 1  0  Was there an injury with Fall?  1 1 0 0  Fall Risk Category Calculator  3 3 1  0  Fall Risk Category (Retired)  High      (RETIRED) Patient Fall Risk Level High fall risk  High fall risk      Patient at Risk for Falls Due to  History of fall(s);Impaired balance/gait;Impaired mobility History of fall(s);Impaired balance/gait;Impaired mobility No Fall Risks   Fall risk Follow up  Falls evaluation completed  Falls evaluation completed Falls evaluation completed      Data saved with a previous flowsheet row definition   Functional Status Survey:    Vitals:   04/10/24 1044  BP: (!) 118/54   Pulse: 68  Resp: 18  Temp: (!) 97.2 F (36.2 C)  SpO2: 97%  Weight: 204 lb 8 oz (92.8 kg)  Height: 5' 6 (1.676 m)   Body mass index is 33.01 kg/m. Physical Exam  Labs reviewed: Recent Labs    08/17/23 0000 02/01/24 0000 02/07/24 0000  NA 138 138 137  K 4.7 4.5 4.9  CL 104 103 102  CO2 28* 27* 28*  BUN 17 16 21   CREATININE 1.2 1.3 1.3  CALCIUM 10.5 10.8* 11.1*   Recent Labs    08/10/23 0000 02/01/24 0000 02/07/24 0000  AST 19 23 20   ALT 11 12 14   ALKPHOS 75 101 95  ALBUMIN 3.8 3.8 3.9   Recent Labs    07/15/23 0000  08/10/23 0000 02/01/24 0000 02/07/24 0000  WBC 9.1 9.3 10.5 12.0  NEUTROABS 8,199.00 6,547.00 7,287.00 8,640.00  HGB 11.1* 10.5* 11.6* 11.5*  HCT 34* 32* 37* 35*  PLT 296 347 335  --    Lab Results  Component Value Date   TSH 1.78 02/08/2024   Lab Results  Component Value Date   HGBA1C  08/24/2009    5.7 (NOTE) The ADA recommends the following therapeutic goal for glycemic control related to Hgb A1c measurement: Goal of therapy: <6.5 Hgb A1c  Reference: American Diabetes Association: Clinical Practice Recommendations 2010, Diabetes Care, 2010, 33: (Suppl  1).   Lab Results  Component Value Date   CHOL 116 12/03/2022   HDL 46 12/03/2022   LDLCALC 54 12/03/2022   LDLDIRECT 55 09/02/2020   TRIG 80 12/03/2022   CHOLHDL 3.3 09/02/2020    Significant Diagnostic Results in last 30 days:  No results found.  Assessment/Plan   Major Neurocognitive disorder No agitation per nursing staff  Will cont with aricept   Cont with supportive care   Afib  Rate controlled No signs of bleeding Cont with e;iqios  CHF Lungs clear Chronic lower extremity swelling Cont with compression stockings Cont with lasix  Neuropathy Cont with gabapentin   Bipolar disorder Cont with lithium    BPH  Cont with flomax   Insomnia Cont with trazodone 

## 2024-04-18 ENCOUNTER — Non-Acute Institutional Stay (SKILLED_NURSING_FACILITY): Payer: Self-pay | Admitting: Adult Health

## 2024-04-18 ENCOUNTER — Encounter: Payer: Self-pay | Admitting: Adult Health

## 2024-04-18 DIAGNOSIS — L03032 Cellulitis of left toe: Secondary | ICD-10-CM

## 2024-04-18 DIAGNOSIS — R4189 Other symptoms and signs involving cognitive functions and awareness: Secondary | ICD-10-CM | POA: Diagnosis not present

## 2024-04-18 DIAGNOSIS — I5042 Chronic combined systolic (congestive) and diastolic (congestive) heart failure: Secondary | ICD-10-CM | POA: Diagnosis not present

## 2024-04-18 NOTE — Progress Notes (Unsigned)
 Location:  [facility] Room Number: N018-A Place of Service:  SNF (31) Provider:  Medina-Vargas, Sergey Ishler, DNP, FNP-BC  Patient Care Team: Mast, Man X, NP as PCP - General (Internal Medicine) Bernie Lamar PARAS, MD as PCP - Cardiology (Cardiology)  Extended Emergency Contact Information Primary Emergency Contact: Lighty,[name] Address: [phone]           [facility] [address]          [place], [place] [address] [place] Home Phone: [phone] Mobile Phone: [phone] Relation: Spouse Secondary Emergency Contact: Coate,Andy Address: [address].          [place], [place] [address] [place] Home Phone: [phone] Mobile Phone: [phone] Relation: Son  Code Status:  DNR  Goals of care: Advanced Directive information    04/18/2024    2:17 PM  Advanced Directives  Does Patient Have a Medical Advance Directive? Yes  Type of Advance Directive Out of facility DNR (pink MOST or yellow form)  Does patient want to make changes to medical advance directive? No - Patient declined  Pre-existing out of facility DNR order (yellow form or pink MOST form) Yellow form placed in chart (order not valid for inpatient use)     Chief Complaint  Patient presents with   Toe Pain     Left great toe pain    HPI:  Pt is a 89 y.o. male seen today for medical management of chronic diseases.  ***   Past Medical History:  Diagnosis Date   AF (paroxysmal atrial fibrillation) (HCC) 02/16/2018   Atrial fibrillation (HCC)    Atrial fibrillation with RVR (HCC) 02/16/2018   Bipolar 1 disorder (HCC)    Bipolar disorder (HCC) 02/17/2018   Dizziness 02/26/2020   DJD (degenerative joint disease)    Dyslipidemia 02/26/2020   Gait instability 02/16/2018   Hypertension    Mitral regurgitation    mild    Polycythemia 02/16/2018   Renal insufficiency    Stroke [facility])    Tremor 02/16/2018   Tremors of nervous system 01/2018    Past Surgical History:  Procedure Laterality Date   SHOULDER SURGERY      Allergies  Allergen Reactions   Lipitor [Atorvastatin] Other (See Comments)    Joint soreness    Outpatient Encounter Medications as of 04/18/2024  Medication Sig   ascorbic acid  (VITAMIN C ) 500 MG tablet Take 500 mg by mouth daily.   Calcium Carbonate-Vit D-Min (CALCIUM 600+D PLUS MINERALS) 600-400 MG-UNIT CHEW Chew 1 Piece by mouth in the morning and at bedtime.   docusate sodium  (COLACE) 100 MG capsule Take 1 capsule (100 mg total) by mouth 2 (two) times daily.   donepezil  (ARICEPT ) 5 MG tablet Take 5 mg by mouth at bedtime.   doxycycline (VIBRA-TABS) 100 MG tablet Take 100 mg by mouth 2 (two) times daily.   ELIQUIS  5 MG TABS tablet TAKE 1 TABLET BY MOUTH TWICE DAILY.   furosemide (LASIX) 20 MG tablet Take 20 mg by mouth daily.   gabapentin (NEURONTIN) 100 MG capsule Take 200 mg by mouth at bedtime.   HYDROcodone -acetaminophen  (NORCO/VICODIN) 5-325 MG tablet Take 0.5 tablets by mouth in the morning and at bedtime.   latanoprost  (XALATAN ) 0.005 % ophthalmic solution Place 1 drop into both eyes at bedtime.   lithium  carbonate (LITHOBID ) 300 MG ER tablet Take 1 tablet (300 mg total) by mouth every evening.   Multiple Vitamin (MULTIVITAMIN WITH MINERALS) TABS tablet Take  1 tablet by mouth daily.   mupirocin ointment (BACTROBAN) 2 % 1 Application daily.   Polyethyl Glycol-Propyl Glycol (SYSTANE) 0.4-0.3 % SOLN Apply 1 drop to eye every 4 (four) hours as needed (dry eyes). And four times a day scheduled   potassium chloride (MICRO-K) 10 MEQ CR capsule Take 10 mEq by mouth daily.   pravastatin  (PRAVACHOL ) 20 MG tablet Take 20 mg by mouth at bedtime.   sodium chloride  (MURO 128) 5 % ophthalmic solution Place 1 drop into both eyes at bedtime.   tamsulosin  (FLOMAX ) 0.4 MG CAPS capsule Take 1 capsule (0.4 mg total) by mouth daily.   timolol  (BETIMOL ) 0.5 % ophthalmic solution Place 1 drop into both eyes daily.    traZODone  (DESYREL ) 50 MG tablet Take 1 tablet (50 mg total) by mouth at bedtime.   No facility-administered encounter medications on file as of 04/18/2024.    Review of Systems ***    Immunization History  Administered Date(s) Administered   Hepatitis B, ADULT 11/26/1997, 12/27/1997, 05/08/1998   INFLUENZA, HIGH DOSE SEASONAL PF 03/31/2023   Influenza-Unspecified 04/15/2022, 04/04/2024   Moderna Covid-19 Vaccine Bivalent Booster 33yrs & up 04/14/2023   Moderna Sars-Covid-2 Vaccination 07/03/2019, 07/31/2019, 05/13/2020, 03/19/2021   PPD Test 07/08/2022   Pneumococcal Conjugate-13 04/26/2014   Pneumococcal Polysaccharide-23 04/14/2005   RSV,unspecified 08/19/2022   Td 08/04/2022   Tdap 05/18/2016   Tetanus 08/04/2022   Zoster Recombinant(Shingrix) 07/29/2022, 10/28/2022   Pertinent  Health Maintenance Due  Topic Date Due   Influenza Vaccine  Completed      07/08/2022    9:22 AM 07/10/2022   11:13 AM 08/03/2022    4:30 PM 08/25/2022   11:49 AM 09/24/2022    1:55 PM  Fall Risk  Falls in the past year?  1 1 1  0  Was there an injury with Fall?  1 1 0 0  Fall Risk Category Calculator  3 3 1  0  Fall Risk Category (Retired)  High      (RETIRED) Patient Fall Risk Level High fall risk  High fall risk      Patient at Risk for Falls Due to  History of fall(s);Impaired balance/gait;Impaired mobility History of fall(s);Impaired balance/gait;Impaired mobility No Fall Risks   Fall risk Follow up  Falls evaluation completed  Falls evaluation completed Falls evaluation completed      Data saved with a previous flowsheet row definition     Vitals:   04/18/24 1405  BP: (!) 122/51  Pulse: 71  Resp: 18  Temp: (!) 97.2 F (36.2 C)  SpO2: 91%  Weight: 204 lb 8 oz (92.8 kg)  Height: 5' 6 (1.676 m)   Body mass index is 33.01 kg/m.  Physical Exam     Labs reviewed: Recent Labs    08/17/23 0000 02/01/24 0000 02/07/24 0000  NA 138 138 137  K 4.7 4.5 4.9  CL 104 103 102  CO2  28* 27* 28*  BUN 17 16 21   CREATININE 1.2 1.3 1.3  CALCIUM 10.5 10.8* 11.1*   Recent Labs    08/10/23 0000 02/01/24 0000 02/07/24 0000  AST 19 23 20   ALT 11 12 14   ALKPHOS 75 101 95  ALBUMIN 3.8 3.8 3.9   Recent Labs    07/15/23 0000 08/10/23 0000 02/01/24 0000 02/07/24 0000  WBC 9.1 9.3 10.5 12.0  NEUTROABS 8,199.00 6,547.00 7,287.00 8,640.00  HGB 11.1* 10.5* 11.6* 11.5*  HCT 34* 32* 37* 35*  PLT 296 347 335  --  Lab Results  Component Value Date   TSH 1.78 02/08/2024   Lab Results  Component Value Date   HGBA1C  08/24/2009    5.7 (NOTE) The ADA recommends the following therapeutic goal for glycemic control related to Hgb A1c measurement: Goal of therapy: <6.5 Hgb A1c  Reference: American Diabetes Association: Clinical Practice Recommendations 2010, Diabetes Care, 2010, 33: (Suppl  1).   Lab Results  Component Value Date   CHOL 116 12/03/2022   HDL 46 12/03/2022   LDLCALC 54 12/03/2022   LDLDIRECT 55 09/02/2020   TRIG 80 12/03/2022   CHOLHDL 3.3 09/02/2020    Significant Diagnostic Results in last 30 days:  No results found.  Assessment/Plan ***   Family/ staff Communication: Discussed plan of care with resident and charge nurse  Labs/tests ordered:     Najat Olazabal Medina-Vargas, DNP, MSN, FNP-BC [facility] [phone] (Monday-Friday 8:00 a.m. - 5:00 p.m.) [phone] (after hours)

## 2024-04-20 DIAGNOSIS — M810 Age-related osteoporosis without current pathological fracture: Secondary | ICD-10-CM | POA: Diagnosis not present

## 2024-04-20 DIAGNOSIS — I5042 Chronic combined systolic (congestive) and diastolic (congestive) heart failure: Secondary | ICD-10-CM | POA: Diagnosis not present

## 2024-05-03 ENCOUNTER — Encounter: Payer: Self-pay | Admitting: Nurse Practitioner

## 2024-05-03 ENCOUNTER — Encounter: Payer: Self-pay | Admitting: Psychiatry

## 2024-05-03 ENCOUNTER — Non-Acute Institutional Stay (SKILLED_NURSING_FACILITY): Payer: Self-pay | Admitting: Nurse Practitioner

## 2024-05-03 ENCOUNTER — Ambulatory Visit: Admitting: Psychiatry

## 2024-05-03 DIAGNOSIS — R001 Bradycardia, unspecified: Secondary | ICD-10-CM

## 2024-05-03 DIAGNOSIS — I10 Essential (primary) hypertension: Secondary | ICD-10-CM

## 2024-05-03 DIAGNOSIS — F5101 Primary insomnia: Secondary | ICD-10-CM

## 2024-05-03 DIAGNOSIS — G3184 Mild cognitive impairment, so stated: Secondary | ICD-10-CM

## 2024-05-03 DIAGNOSIS — R350 Frequency of micturition: Secondary | ICD-10-CM

## 2024-05-03 DIAGNOSIS — I509 Heart failure, unspecified: Secondary | ICD-10-CM | POA: Diagnosis not present

## 2024-05-03 DIAGNOSIS — F319 Bipolar disorder, unspecified: Secondary | ICD-10-CM

## 2024-05-03 DIAGNOSIS — Z79899 Other long term (current) drug therapy: Secondary | ICD-10-CM

## 2024-05-03 DIAGNOSIS — F5105 Insomnia due to other mental disorder: Secondary | ICD-10-CM | POA: Diagnosis not present

## 2024-05-03 DIAGNOSIS — N1831 Chronic kidney disease, stage 3a: Secondary | ICD-10-CM

## 2024-05-03 DIAGNOSIS — F0390 Unspecified dementia without behavioral disturbance: Secondary | ICD-10-CM

## 2024-05-03 DIAGNOSIS — I4819 Other persistent atrial fibrillation: Secondary | ICD-10-CM

## 2024-05-03 DIAGNOSIS — M150 Primary generalized (osteo)arthritis: Secondary | ICD-10-CM

## 2024-05-03 DIAGNOSIS — G309 Alzheimer's disease, unspecified: Secondary | ICD-10-CM

## 2024-05-03 DIAGNOSIS — F0280 Dementia in other diseases classified elsewhere without behavioral disturbance: Secondary | ICD-10-CM

## 2024-05-03 NOTE — Progress Notes (Unsigned)
 Location:   [facility] Room Number: 18 Place of Service:  SNF (31) Provider: Larwance Pang Robers NP  Ryaan Vanwagoner X, NP  Patient Care Team: Gloristine Turrubiates X, NP as PCP - General (Internal Medicine) Bernie Lamar PARAS, MD as PCP - Cardiology (Cardiology)  Extended Emergency Contact Information Primary Emergency Contact: Asante,[name] Address: [phone].           [facility] [address]          [place], [place] [address] [place] Home Phone: [phone] Mobile Phone: [phone] Relation: Spouse Secondary Emergency Contact: Prouty,Andy Address: [address]          [place], [place] [address] [place] Home Phone: [phone] Mobile Phone: [phone] Relation: Son  Code Status:  DNR Goals of care: Advanced Directive information    04/18/2024    2:17 PM  Advanced Directives  Does Patient Have a Medical Advance Directive? Yes  Type of Advance Directive Out of facility DNR (pink MOST or yellow form)  Does patient want to make changes to medical advance directive? No - Patient declined  Pre-existing out of facility DNR order (yellow form or pink MOST form) Yellow form placed in chart (order not valid for inpatient use)     Chief Complaint  Patient presents with  . Medical Management of Chronic Issues    HPI:  Pt is a 89 y.o. male seen today for medical management of chronic diseases.     Urinary frequency, taking Tamsulosin , off Myrbetriq by urology 11/18/23              HOH L>R, HPOA-wife declined hearing aids.              Parkinson's disease, stopped parkinson's medication because of affected his cognition, had resting tremor in left arm/leg in the past             OP 12/01/22 dc'd Alendronate tscore -2.9 2017, -0.9 01/19/19, refused DEXA 11/02/22             OA shoulders pain, worsens at night, limited abduction ROM over shoulder level, hx of neck clicks with ROM, aches, c/o right sciatica pain, worsens in am, chronic left knee  pain,  Norco and Gabapentin for pain control.              Cognitive impairment, SNF [facility] for supportive care, 07/16/22 MMSE 18/30, taking Deonepezil sine 8/21/2 by Psych, intermittent confusions.              CVA, no focal weakness residual.              Afib, taking  Eliquis , TSH 1.78 02/08/24             Bradycardia, off Metoprolol , Diltiazem  2/2 to bradycardia. 08/24/23 38/60 sec with V tach, stopped Metoprolol  per cardiology             Bipolar disorder, followed by psychiatry, on Lithium , TSH 1.78 02/08/24, Lithium  level 0.5(0.6-1.2) 02/08/24             Insomnia, takes Trazodone  since 02/17/23 by psych             CHF, EF 45-50% 11/2021, taking Furosemide, trace edema BLE, 05/10/23 CXR-mild pulmonary venous congestion, no infiltrate, followed by cardiology.              Gait abnormality, uses w/c for mobility              Hyponatremia, Na  137 02/07/24             Hyperbilirubinemia, bilirubin 0.7 02/07/24            Polycythemia,  Hgb 11.5 02/07/24             CKD, Bun/creat 21/1.3 02/07/24             Hypogonadism, hx of  testosterone  inj, dc'd by urology 11/18/23             Hyperlipidemia, takes Pravastatin              Glaucoma, eye drops.   Past Medical History:  Diagnosis Date  . AF (paroxysmal atrial fibrillation) (HCC) 02/16/2018  . Atrial fibrillation (HCC)   . Atrial fibrillation with RVR (HCC) 02/16/2018  . Bipolar 1 disorder (HCC)   . Bipolar disorder (HCC) 02/17/2018  . Dizziness 02/26/2020  . DJD (degenerative joint disease)   . Dyslipidemia 02/26/2020  . Gait instability 02/16/2018  . Hypertension   . Mitral regurgitation    mild   . Polycythemia 02/16/2018  . Renal insufficiency   . Stroke (HCC)   . Tremor 02/16/2018  . Tremors of nervous system 01/2018   Past Surgical History:  Procedure Laterality Date  . SHOULDER SURGERY      Allergies  Allergen Reactions  . Lipitor [Atorvastatin] Other (See Comments)    Joint soreness    Allergies as of 05/03/2024       Reactions    Lipitor [atorvastatin] Other (See Comments)   Joint soreness        Medication List        Accurate as of May 03, 2024  4:54 PM. If you have any questions, ask your nurse or doctor.          ascorbic acid  500 MG tablet Commonly known as: VITAMIN C  Take 500 mg by mouth daily.   Calcium 600+D Plus Minerals 600-400 MG-UNIT Chew Chew 1 Piece by mouth in the morning and at bedtime.   docusate sodium  100 MG capsule Commonly known as: Colace Take 1 capsule (100 mg total) by mouth 2 (two) times daily.   donepezil  5 MG tablet Commonly known as: ARICEPT  Take 5 mg by mouth at bedtime.   Eliquis  5 MG Tabs tablet Generic drug: apixaban  TAKE 1 TABLET BY MOUTH TWICE DAILY.   furosemide 20 MG tablet Commonly known as: LASIX Take 20 mg by mouth daily.   gabapentin 100 MG capsule Commonly known as: NEURONTIN Take 200 mg by mouth at bedtime.   HYDROcodone -acetaminophen  5-325 MG tablet Commonly known as: NORCO/VICODIN Take 0.5 tablets by mouth in the morning and at bedtime.   latanoprost  0.005 % ophthalmic solution Commonly known as: XALATAN  Place 1 drop into both eyes at bedtime.   lithium  carbonate 300 MG ER tablet Commonly known as: LITHOBID  Take 1 tablet (300 mg total) by mouth every evening.   multivitamin with minerals Tabs tablet Take 1 tablet by mouth daily.   mupirocin ointment 2 % Commonly known as: BACTROBAN 1 Application daily.   potassium chloride 10 MEQ CR capsule Commonly known as: MICRO-K Take 10 mEq by mouth daily.   pravastatin  20 MG tablet Commonly known as: PRAVACHOL  Take 20 mg by mouth at bedtime.   sodium chloride  5 % ophthalmic solution Commonly known as: MURO 128 Place 1 drop into both eyes at bedtime.   Systane 0.4-0.3 % Soln Generic drug: Polyethyl Glycol-Propyl Glycol Apply 1 drop to eye every 4 (four) hours as needed (dry  eyes). And four times a day scheduled   tamsulosin  0.4 MG Caps capsule Commonly known as: FLOMAX  Take  1 capsule (0.4 mg total) by mouth daily.   timolol  0.5 % ophthalmic solution Commonly known as: BETIMOL  Place 1 drop into both eyes daily.   traZODone  50 MG tablet Commonly known as: DESYREL  Take 1 tablet (50 mg total) by mouth at bedtime.        Review of Systems  Constitutional:  Negative for appetite change, fatigue and fever.  HENT:  Positive for hearing loss. Negative for congestion and trouble swallowing.   Eyes:  Negative for visual disturbance.  Respiratory:  Positive for cough. Negative for chest tightness and wheezing.   Cardiovascular:  Positive for leg swelling.  Gastrointestinal:  Negative for abdominal pain and constipation.  Genitourinary:  Negative for dysuria and urgency.       Incontinent of urine.   Musculoskeletal:  Positive for arthralgias, back pain and gait problem.       Shoulders pain with above shoulder level abduction ROM, worsens at night. R sciatic pain, not new, worse in am when getting up, chronic left knee pain-worsened pain now Worsened left neck/shoulder pain /tingling, numbness arm, no decreased muscle strength.   Skin:  Negative for color change and wound.  Neurological:  Negative for speech difficulty, weakness and headaches.  Psychiatric/Behavioral:  Positive for confusion. Negative for sleep disturbance. The patient is not nervous/anxious.        Early am awake, chronic     Immunization History  Administered Date(s) Administered  . Hepatitis B, ADULT 11/26/1997, 12/27/1997, 05/08/1998  . INFLUENZA, HIGH DOSE SEASONAL PF 03/31/2023  . Influenza-Unspecified 04/15/2022, 04/04/2024  . Moderna Covid-19 Vaccine Bivalent Booster 46yrs & up 04/14/2023  . Moderna Sars-Covid-2 Vaccination 07/03/2019, 07/31/2019, 05/13/2020, 03/19/2021  . PPD Test 07/08/2022  . Pneumococcal Conjugate-13 04/26/2014  . Pneumococcal Polysaccharide-23 04/14/2005  . RSV,unspecified 08/19/2022  . Td 08/04/2022  . Tdap 05/18/2016  . Tetanus 08/04/2022  . Zoster  Recombinant(Shingrix) 07/29/2022, 10/28/2022   Pertinent  Health Maintenance Due  Topic Date Due  . Influenza Vaccine  Completed      07/08/2022    9:22 AM 07/10/2022   11:13 AM 08/03/2022    4:30 PM 08/25/2022   11:49 AM 09/24/2022    1:55 PM  Fall Risk  Falls in the past year?  1 1 1  0  Was there an injury with Fall?  1 1 0 0  Fall Risk Category Calculator  3 3 1  0  Fall Risk Category (Retired)  High      (RETIRED) Patient Fall Risk Level High fall risk  High fall risk      Patient at Risk for Falls Due to  History of fall(s);Impaired balance/gait;Impaired mobility History of fall(s);Impaired balance/gait;Impaired mobility No Fall Risks   Fall risk Follow up  Falls evaluation completed  Falls evaluation completed Falls evaluation completed      Data saved with a previous flowsheet row definition   Functional Status Survey:    Vitals:   05/03/24 1645  BP: (!) 112/90  Pulse: 64  Resp: 18  Temp: 97.7 F (36.5 C)  SpO2: 98%  Weight: 213 lb (96.6 kg)   Body mass index is 34.38 kg/m. Physical Exam Vitals and nursing note reviewed.  Constitutional:      Appearance: Normal appearance.  HENT:     Head: Normocephalic and atraumatic.     Nose: No congestion.     Mouth/Throat:  Mouth: Mucous membranes are moist.     Pharynx: No posterior oropharyngeal erythema.  Eyes:     Extraocular Movements: Extraocular movements intact.     Conjunctiva/sclera: Conjunctivae normal.     Pupils: Pupils are equal, round, and reactive to light.  Cardiovascular:     Rate and Rhythm: Normal rate. Rhythm irregular.     Heart sounds: No murmur heard. Pulmonary:     Effort: Pulmonary effort is normal.     Breath sounds: No rales.     Comments: Central congestion Abdominal:     General: Bowel sounds are normal.     Palpations: Abdomen is soft.     Tenderness: There is no abdominal tenderness.  Genitourinary:    Comments: External hemorrhoids 4pm, no active bleeding or s/s of  injury Musculoskeletal:        General: Tenderness present.     Cervical back: Normal range of motion and neck supple.     Right lower leg: Edema present.     Left lower leg: Edema present.     Comments: trace edema BLE.  Shoulders pain with above shoulder level abduction ROM, worsens at night, R sciatica pain worse in am/getting out of bed, chronic left knee pain  Skin:    General: Skin is warm and dry.     Comments:    Neurological:     General: No focal deficit present.     Mental Status: He is alert and oriented to person, place, and time. Mental status is at baseline.     Motor: No weakness.     Gait: Gait abnormal.  Psychiatric:        Mood and Affect: Mood normal.        Behavior: Behavior normal.        Thought Content: Thought content normal.     Labs reviewed: Recent Labs    08/17/23 0000 02/01/24 0000 02/07/24 0000  NA 138 138 137  K 4.7 4.5 4.9  CL 104 103 102  CO2 28* 27* 28*  BUN 17 16 21   CREATININE 1.2 1.3 1.3  CALCIUM 10.5 10.8* 11.1*   Recent Labs    08/10/23 0000 02/01/24 0000 02/07/24 0000  AST 19 23 20   ALT 11 12 14   ALKPHOS 75 101 95  ALBUMIN 3.8 3.8 3.9   Recent Labs    07/15/23 0000 08/10/23 0000 02/01/24 0000 02/07/24 0000  WBC 9.1 9.3 10.5 12.0  NEUTROABS 8,199.00 6,547.00 7,287.00 8,640.00  HGB 11.1* 10.5* 11.6* 11.5*  HCT 34* 32* 37* 35*  PLT 296 347 335  --    Lab Results  Component Value Date   TSH 1.78 02/08/2024   Lab Results  Component Value Date   HGBA1C  08/24/2009    5.7 (NOTE) The ADA recommends the following therapeutic goal for glycemic control related to Hgb A1c measurement: Goal of therapy: <6.5 Hgb A1c  Reference: American Diabetes Association: Clinical Practice Recommendations 2010, Diabetes Care, 2010, 33: (Suppl  1).   Lab Results  Component Value Date   CHOL 116 12/03/2022   HDL 46 12/03/2022   LDLCALC 54 12/03/2022   LDLDIRECT 55 09/02/2020   TRIG 80 12/03/2022   CHOLHDL 3.3 09/02/2020     Significant Diagnostic Results in last 30 days:  No results found.  Assessment/Plan  Bradycardia Heart rate is normalized,  off Metoprolol , Diltiazem  2/2 to bradycardia. 08/24/23 38/60 sec with V tach, stopped Metoprolol  per cardiology  Bipolar disorder (HCC) His mood is a stable, followed  by psychiatry, on Lithium , TSH 1.78 02/08/24, Lithium  level 0.5(0.6-1.2) 02/08/24  Insomnia Stable, takes Trazodone  since 02/17/23 by psych  Chronic CHF (HCC) Euvolemic, EF 45-50% 11/2021, taking Furosemide, trace edema BLE, 05/10/23 CXR-mild pulmonary venous congestion, no infiltrate, followed by cardiology.   CKD (chronic kidney disease) stage 3, GFR 30-59 ml/min (HCC) Bun/creat 21/1.3 02/07/24  Atrial fibrillation, persistent (HCC) Heart rate is being controlled, taking  Eliquis , TSH 1.78 02/08/24  Major neurocognitive disorder (HCC) No behavioral issues, SNF [facility] for supportive care, 07/16/22 MMSE 18/30, taking Deonepezil sine 8/21/2 by Psych, intermittent confusions.   Osteoarthritis, multiple sites   OA shoulders pain, worsens at night, limited abduction ROM over shoulder level, hx of neck clicks with ROM, aches, c/o right sciatica pain, worsens in am, chronic left knee pain,  Norco and Gabapentin for pain control.   Urinary frequency taking Tamsulosin , off Myrbetriq by urology 11/18/23   Family/ staff Communication: Plan of care reviewed with the patient and the charge nurse  Labs/tests ordered: None

## 2024-05-03 NOTE — Assessment & Plan Note (Signed)
 OA shoulders pain, worsens at night, limited abduction ROM over shoulder level, hx of neck clicks with ROM, aches, c/o right sciatica pain, worsens in am, chronic left knee pain,  Norco and Gabapentin for pain control.

## 2024-05-03 NOTE — Assessment & Plan Note (Signed)
 His mood is a stable, followed by psychiatry, on Lithium , TSH 1.78 02/08/24, Lithium  level 0.5(0.6-1.2) 02/08/24

## 2024-05-03 NOTE — Assessment & Plan Note (Signed)
 Stable,  takes Trazodone since 02/17/23 by psych

## 2024-05-03 NOTE — Assessment & Plan Note (Signed)
 taking Tamsulosin , off Myrbetriq by urology 11/18/23

## 2024-05-03 NOTE — Assessment & Plan Note (Signed)
 No behavioral issues, SNF [facility] for supportive care, 07/16/22 MMSE 18/30, taking Deonepezil sine 8/21/2 by Psych, intermittent confusions.

## 2024-05-03 NOTE — Assessment & Plan Note (Signed)
 Heart rate is normalized,  off Metoprolol , Diltiazem  2/2 to bradycardia. 08/24/23 38/60 sec with V tach, stopped Metoprolol  per cardiology

## 2024-05-03 NOTE — Assessment & Plan Note (Signed)
 Euvolemic, EF 45-50% 11/2021, taking Furosemide, trace edema BLE, 05/10/23 CXR-mild pulmonary venous congestion, no infiltrate, followed by cardiology.

## 2024-05-03 NOTE — Assessment & Plan Note (Signed)
 Heart rate is being controlled, taking  Eliquis , TSH 1.78 02/08/24

## 2024-05-03 NOTE — Progress Notes (Signed)
 Sean Bishop [id number] 1932/07/18 89 y.o.   Subjective:   Patient ID:  Sean Bishop is a 89 y.o. (DOB Jan 15, 1933) male.  Chief Complaint:  Chief Complaint  Patient presents with   Follow-up   Depression   Stress    HPI Sean Bishop presents for follow-up of bipolar disorder.  seen August 2020.  He was having balance problems and lithium  was reduced from 600 mg daily to 450 mg daily.  He was encouraged to get a lithium  level as soon as possible.  08/02/2019 appointment with the following noted and without med changes. Overall doing very well.  Handling lockdown pretty well.  Couldn't go much of anywhere from the facility.  Missed socialization.  They are staying in [facility].  Not a bad facility.  No complaints with it.   No sig exercise.  Some walking in the building. Got both Covid vaccines.  Son engineer, civil (consulting) in [place]. No sig tremor.  Some walking problems.  12/23/2020 appointment with the following noted:  seen with wife Health problems. On lithium  450 mg daily. Has had physical problems.  Using walker.   W thinks he's doing OK.  However she notes that he is memory has declined.  Sometimes he remembers things in a way that is different from the way they actually happened.  However he has not had any unusual mood swings. PCP says he cannot drive anymore bc neuropathy and physical problems. Plan: get lithium  level  06/26/21 appt noted: Had trouble getting the lithium  level.   Hip pain and needs walker.   No SE noted with lithium . Plan: no changes  12/24/21 appt noted: [facility].  Sean Bishop doing well. Doing well.  No unusual problems. Lithium  450 mg HS Not driving. No med swings since reduction in lithium . Patient reports stable mood and denies depressed or irritable moods.  Patient denies any recent difficulty with anxiety.  Patient denies difficulty with sleep initiation or maintenance. Denies appetite disturbance.  Patient reports that energy and  motivation have been good.  Patient reports some difficulty with concentration and memory.  Patient denies any suicidal ideation.  Aware of cognitive problems.   06/25/22 appt noted: [facility].  Sean Bishop doing well. Doing well.  No unusual problems. Lithium  450 mg HS Not driving. Requiring walker.   Admits forgetfulness.  No mood swings. Some health problems. No mood problems with mood or anxiety.  No psychosis.  No tremor..  02/17/23 appt noted:  seen with wife Sean Bishop. Losing memory.   Moved to [facility].   Meds reduced to lithium  CR 300 mg HS Complaining of trouble going to sleep.   Some times of dep but not extended.   Wife says he gets a bit obsessive on things he has to do.  He acknowledges.  Can ruminate over health concerns.  Upset that he can't be with wife.   He's having some problems dealing with some things from the past per wife.  Hard for her to deal with it.  Tolerating meds. Plan: Donepezil  5 mg daily for a month, then 10 mg daily  Insomnia is causing clinical problems, reasonable trial trazodone  50 mg HS.  05/03/23 appt noted:  seen with wife Talks about staying at [facility].  Had a major problem a the facility.  Got upset they didn't give him pain meds for shoulder and sleep meds.  They stopped without telling him anything about it.  But got them resumed.  Not entirely happy with the location.   Psych meds: donepezil  10, lithium  300 HS, getting Norco 5 0.5 tablet BID, trazodone  50 HS Sleep has improved.  Was really bad but is better now. Goes to bed and dressed for bed at 8.  Is not tired or sleepy but doesn't want to watch TV.  They get him up at 7 .   [employer] and watches the games.  Knows the hockey schedule.    SE some hangover but OK after breakfast. W doesn't visit daily.   Exercises. Shoulder pain.  11/01/23 appt noted:  seen with wife, Sean Bishop.  Both at [facility] in different locations Med:  donepezil  10, lithium  300 HS, getting Norco 5 0.5 tablet BID, trazodone  50 HS No SE. Married 63 yrs.   No mood swings.  Not dep.  Sleep good.  Son engineer, civil (consulting) in [place].  11/05/23 Lithium  level 0.4.  likely ok given long hx stability and pattern of low normal lithium  levels.  No change  Sean Macintosh, MD, DFAPA   11/5//25 seen with wife, Sean Bishop.  Both at [facility] in different locations Med: donepezil  5, lithium  300 HS, getting Norco 5 0.5 tablet BID, trazodone  50 HS No SE. No sig dep.  Passing problems.  But gets really down over not able to do anything and feeling useless.  Loves working with people. Wife says he gets bored at [facility]  His friend Sean Bishop died was hard.  Not many other friends he can talk to.   Some trouble getting to sleep and uses trazodone  but then sleeps deeply.   Wife Sean Bishop. Adopted kids Sean Bishop nurse doing well;  Sean Bishop was murdered or suicide.  PCP Sean Bishop advised against driving bc history of syncope.  Past Psychiatric Medication Trials:  Decades of Lithium ,  Lorazepam   Review of Systems:  Review of Systems  Constitutional:  Negative for fatigue.  Musculoskeletal:  Positive for arthralgias, back pain and gait problem.  Neurological:  Positive for weakness. Negative for tremors and syncope.  Psychiatric/Behavioral:  Positive for decreased concentration. Negative for agitation, behavioral problems, confusion, dysphoric mood, hallucinations, self-injury, sleep disturbance and suicidal ideas. The patient is not nervous/anxious and is not hyperactive.     Medications: I have reviewed the patient's current medications.  Current Outpatient Medications  Medication Sig Dispense Refill   ascorbic acid  (VITAMIN C ) 500 MG tablet Take 500 mg by mouth daily.     Calcium Carbonate-Vit D-Min (CALCIUM 600+D PLUS MINERALS) 600-400 MG-UNIT CHEW Chew 1 Piece by mouth in the morning and at bedtime.     docusate sodium  (COLACE) 100 MG capsule Take 1 capsule (100 mg total) by  mouth 2 (two) times daily.     donepezil  (ARICEPT ) 5 MG tablet Take 5 mg by mouth at bedtime.     ELIQUIS  5 MG TABS tablet TAKE 1 TABLET BY MOUTH TWICE DAILY. 180 tablet 1   furosemide (LASIX) 20 MG tablet Take 20 mg by mouth daily.     gabapentin (NEURONTIN) 100 MG capsule Take 200 mg by mouth at bedtime.     HYDROcodone -acetaminophen  (NORCO/VICODIN) 5-325 MG tablet Take 0.5 tablets by mouth in the morning and at bedtime. 30 tablet 0   latanoprost  (XALATAN ) 0.005 % ophthalmic solution Place 1 drop into both eyes at bedtime.     lithium  carbonate (LITHOBID ) 300 MG ER tablet Take 1 tablet (300 mg total) by mouth every evening. 90 tablet 1   Multiple Vitamin (MULTIVITAMIN WITH MINERALS) TABS tablet Take 1 tablet by  mouth daily.     mupirocin ointment (BACTROBAN) 2 % 1 Application daily.     Polyethyl Glycol-Propyl Glycol (SYSTANE) 0.4-0.3 % SOLN Apply 1 drop to eye every 4 (four) hours as needed (dry eyes). And four times a day scheduled     potassium chloride (MICRO-K) 10 MEQ CR capsule Take 10 mEq by mouth daily.     pravastatin  (PRAVACHOL ) 20 MG tablet Take 20 mg by mouth at bedtime.     sodium chloride  (MURO 128) 5 % ophthalmic solution Place 1 drop into both eyes at bedtime.     tamsulosin  (FLOMAX ) 0.4 MG CAPS capsule Take 1 capsule (0.4 mg total) by mouth daily.     timolol  (BETIMOL ) 0.5 % ophthalmic solution Place 1 drop into both eyes daily.     traZODone  (DESYREL ) 50 MG tablet Take 1 tablet (50 mg total) by mouth at bedtime. 30 tablet 5   No current facility-administered medications for this visit.    Medication Side Effects: None  Allergies:  Allergies  Allergen Reactions   Lipitor [Atorvastatin] Other (See Comments)    Joint soreness    Past Medical History:  Diagnosis Date   AF (paroxysmal atrial fibrillation) (HCC) 02/16/2018   Atrial fibrillation (HCC)    Atrial fibrillation with RVR (HCC) 02/16/2018   Bipolar 1 disorder (HCC)    Bipolar disorder (HCC) 02/17/2018    Dizziness 02/26/2020   DJD (degenerative joint disease)    Dyslipidemia 02/26/2020   Gait instability 02/16/2018   Hypertension    Mitral regurgitation    mild    Polycythemia 02/16/2018   Renal insufficiency    Stroke [facility])    Tremor 02/16/2018   Tremors of nervous system 01/2018    Family History  Problem Relation Age of Onset   Alcohol abuse Mother    Cancer Father     Social History   Socioeconomic History   Marital status: Married    Spouse name: Not on file   Number of children: Not on file   Years of education: Not on file   Highest education level: Not on file  Occupational History   Not on file  Tobacco Use   Smoking status: Former   Smokeless tobacco: Never  Vaping Use   Vaping status: Never Used  Substance and Sexual Activity   Alcohol use: Yes    Alcohol/week: 1.0 standard drink of alcohol    Types: 1 Glasses of wine per week    Comment: 1 per month   Drug use: No   Sexual activity: Not on file  Other Topics Concern   Not on file  Social History Narrative   Not on file   Social Drivers of Health   Financial Resource Strain: Not on file  Food Insecurity: No Food Insecurity (07/02/2022)   Hunger Vital Sign    Worried About Running Out of Food in the Last Year: Never true    Ran Out of Food in the Last Year: Never true  Transportation Needs: No Transportation Needs (07/02/2022)   PRAPARE - Administrator, Civil Service (Medical): No    Lack of Transportation (Non-Medical): No  Physical Activity: Not on file  Stress: Not on file  Social Connections: Not on file  Intimate Partner Violence: Not At Risk (07/02/2022)   Humiliation, Afraid, Rape, and Kick questionnaire    Fear of Current or Ex-Partner: No    Emotionally Abused: No    Physically Abused: No    Sexually Abused: No  Past Medical History, Surgical history, Social history, and Family history were reviewed and updated as appropriate.   Please see review of systems for further  details on the patient's review from today.   Objective:   Physical Exam:  There were no vitals taken for this visit.  Physical Exam Constitutional:      General: He is not in acute distress.    Appearance: He is well-developed.  Neurological:     Mental Status: He is alert and oriented to person, place, and time. Mental status is at baseline.     Cranial Nerves: No dysarthria.     Motor: Weakness present. No tremor.     Coordination: Coordination abnormal.     Gait: Gait abnormal.     Comments: WC bound  Psychiatric:        Attention and Perception: Attention normal.        Mood and Affect: Mood normal. Mood is not anxious, depressed or elated. Affect is not labile, blunt, tearful or inappropriate.        Speech: Speech normal. Speech is not slurred.        Behavior: Behavior normal. Behavior is cooperative.        Thought Content: Thought content normal. Thought content is not paranoid or delusional. Thought content does not include homicidal or suicidal ideation. Thought content does not include suicidal plan.        Cognition and Memory: Cognition is impaired. Memory is impaired. He exhibits impaired recent memory and impaired remote memory.     Comments: Talkative with fair focus  Mood good.  No mania. Somewhat difficult to direct but did ask questions. Word finding problems better than in the past At times get down over situation of living in NH sep from wife.  No use to society.        Lab Review:     Component Value Date/Time   NA 137 02/07/2024 0000   K 4.9 02/07/2024 0000   CL 102 02/07/2024 0000   CO2 28 (A) 02/07/2024 0000   GLUCOSE 88 07/06/2022 0406   BUN 21 02/07/2024 0000   CREATININE 1.3 02/07/2024 0000   CREATININE 1.11 07/06/2022 0406   CALCIUM 11.1 (A) 02/07/2024 0000   PROT 6.4 (L) 07/06/2022 0406   PROT 7.4 09/02/2020 1423   ALBUMIN 3.9 02/07/2024 0000   ALBUMIN 4.5 09/02/2020 1423   AST 20 02/07/2024 0000   ALT 14 02/07/2024 0000   ALKPHOS  95 02/07/2024 0000   BILITOT 1.5 (H) 07/06/2022 0406   BILITOT 1.4 (H) 09/02/2020 1423   GFRNONAA >60 07/06/2022 0406   GFRAA >60 02/17/2018 0129       Component Value Date/Time   WBC 12.0 02/07/2024 0000   WBC 8.9 07/04/2022 1101   RBC 3.92 02/07/2024 0000   HGB 11.5 (A) 02/07/2024 0000   HCT 35 (A) 02/07/2024 0000   PLT 335 02/01/2024 0000   MCV 91.9 07/04/2022 1101   MCH 31.2 07/04/2022 1101   MCHC 34.0 07/04/2022 1101   RDW 13.3 07/04/2022 1101   LYMPHSABS 0.5 (L) 07/01/2022 2346   MONOABS 1.0 07/01/2022 2346   EOSABS 0.0 07/01/2022 2346   BASOSABS 0.0 07/01/2022 2346    Lithium  Lvl  Date Value Ref Range Status  07/01/2022 0.47 (L) 0.60 - 1.20 mmol/L Final    Comment:    Performed at [facility], [address], [place], [place] [address]   07/01/22 lithium  level 0.47 on 300 mg daily.  No  results found for: PHENYTOIN, PHENOBARB, VALPROATE, CBMZ   Labs July 07, 2018 incl Li 0.9,  December 14, 2018 = Cr 1.29, Calcium 10.8, CMP normal  Normal lipids and PTH  12/22 Cr 1.34, Calcium 10.4  Cannot see any labs from 2023 in [facility]  02/08/24 labs normal 11/05/23 lithium  0.4 on 300 mg daily.  .res Assessment: Plan:    Mattia Liford was seen today for follow-up, depression and stress.  Diagnoses and all orders for this visit:  Bipolar I disorder (HCC) -     Lithium  level  Insomnia due to mental condition  Alzheimer's dementia without behavioral disturbance (HCC)  Mild cognitive impairment  Lithium  use   30 min with wife:  Supportive therapy dealing with age related problems and health .  No worsening of his health since his last visit. Except his shoulder pain is worse. .  Not regained full capacity.  Cannot drive anymore DT aging.  Cognition is better with donepezil   .  insomnia is better with trazodone .    now in NH and can get lithium  levels more.  Bipolar has been stable for many years.  No mood swings nor depression.  Not severely distressed  now.  Counseled patient regarding potential benefits, risks, and side effects of lithium  to include potential risk of lithium  affecting thyroid  and renal function.  Discussed need for periodic lab monitoring to determine drug level and to assess for potential adverse effects.  Counseled patient regarding signs and symptoms of lithium  toxicity and advised that they notify office immediately or seek urgent medical attention if experiencing these signs and symptoms.  Patient advised to contact office with any questions or concerns. 07/2022 Cr is better in normal range.  Discussed the importance of avoiding lithium  toxicity and how with aging the body has more difficulty eliminating lithium .  Therefore lithium  dosages may need to be decreased as one ages.     continue lithium  to 300 mg daily not manic.  11/05/23 lithium  0.4.   Memory impairment progressing some.   Knows the doctor and can give recent history.    Supportive therapy dealing with this stage of life, limited activity and socialization and feeling bored and useless.  Disc using faith to address this and he is trying to do so.  Wife supportive.    Donepezil  5 mg daily .  Apparently didn't tolerate 10 mg daily.  Insomnia managed trazodone  50 mg HS. Disc SE.  WC bound and therefore not much fall risk.  This appt was 30 mins.  FU 6 mos  Sean Macintosh, MD, DFAPA   Consider reducing lithium  Please see After Visit Summary for patient specific instructions.  No future appointments.      Orders Placed This Encounter  Procedures   Lithium  level       -------------------------------

## 2024-05-03 NOTE — Assessment & Plan Note (Signed)
 Bun/creat 21/1.3 02/07/24

## 2024-05-04 DIAGNOSIS — I10 Essential (primary) hypertension: Secondary | ICD-10-CM | POA: Diagnosis not present

## 2024-05-04 DIAGNOSIS — F319 Bipolar disorder, unspecified: Secondary | ICD-10-CM | POA: Diagnosis not present

## 2024-05-04 DIAGNOSIS — I5042 Chronic combined systolic (congestive) and diastolic (congestive) heart failure: Secondary | ICD-10-CM | POA: Diagnosis not present

## 2024-05-04 LAB — LIPID PANEL
Cholesterol: 162 (ref 0–200)
HDL: 45 (ref 35–70)
LDL Cholesterol: 96
Triglycerides: 116 (ref 40–160)

## 2024-05-04 LAB — VITAMIN D 25 HYDROXY (VIT D DEFICIENCY, FRACTURES): Vit D, 25-Hydroxy: 37

## 2024-05-05 ENCOUNTER — Other Ambulatory Visit: Payer: Self-pay | Admitting: Nurse Practitioner

## 2024-05-05 DIAGNOSIS — M150 Primary generalized (osteo)arthritis: Secondary | ICD-10-CM

## 2024-05-05 MED ORDER — HYDROCODONE-ACETAMINOPHEN 5-325 MG PO TABS: 0.5000 | ORAL_TABLET | Freq: Two times a day (BID) | ORAL | 0 refills | Status: DC

## 2024-05-05 NOTE — Assessment & Plan Note (Signed)
 Blood pressure fluctuating intermittently, only on Furosemide,  observe.

## 2024-05-09 DIAGNOSIS — L821 Other seborrheic keratosis: Secondary | ICD-10-CM | POA: Diagnosis not present

## 2024-05-09 DIAGNOSIS — L570 Actinic keratosis: Secondary | ICD-10-CM | POA: Diagnosis not present

## 2024-05-09 DIAGNOSIS — L814 Other melanin hyperpigmentation: Secondary | ICD-10-CM | POA: Diagnosis not present

## 2024-05-09 DIAGNOSIS — L853 Xerosis cutis: Secondary | ICD-10-CM | POA: Diagnosis not present

## 2024-05-15 ENCOUNTER — Ambulatory Visit: Payer: Self-pay | Admitting: Psychiatry

## 2024-05-15 ENCOUNTER — Encounter: Payer: Self-pay | Admitting: Psychiatry

## 2024-05-23 DIAGNOSIS — L84 Corns and callosities: Secondary | ICD-10-CM | POA: Diagnosis not present

## 2024-05-23 DIAGNOSIS — B351 Tinea unguium: Secondary | ICD-10-CM | POA: Diagnosis not present

## 2024-05-23 DIAGNOSIS — M79672 Pain in left foot: Secondary | ICD-10-CM | POA: Diagnosis not present

## 2024-05-23 DIAGNOSIS — M79671 Pain in right foot: Secondary | ICD-10-CM | POA: Diagnosis not present

## 2024-06-01 ENCOUNTER — Non-Acute Institutional Stay (SKILLED_NURSING_FACILITY): Payer: Self-pay | Admitting: Nurse Practitioner

## 2024-06-01 ENCOUNTER — Encounter: Payer: Self-pay | Admitting: Nurse Practitioner

## 2024-06-01 DIAGNOSIS — I509 Heart failure, unspecified: Secondary | ICD-10-CM | POA: Diagnosis not present

## 2024-06-01 DIAGNOSIS — I4819 Other persistent atrial fibrillation: Secondary | ICD-10-CM | POA: Diagnosis not present

## 2024-06-01 DIAGNOSIS — R001 Bradycardia, unspecified: Secondary | ICD-10-CM

## 2024-06-01 DIAGNOSIS — F5101 Primary insomnia: Secondary | ICD-10-CM

## 2024-06-01 DIAGNOSIS — R350 Frequency of micturition: Secondary | ICD-10-CM | POA: Diagnosis not present

## 2024-06-01 DIAGNOSIS — Z66 Do not resuscitate: Secondary | ICD-10-CM

## 2024-06-01 DIAGNOSIS — K5901 Slow transit constipation: Secondary | ICD-10-CM

## 2024-06-01 DIAGNOSIS — F319 Bipolar disorder, unspecified: Secondary | ICD-10-CM

## 2024-06-01 DIAGNOSIS — N1831 Chronic kidney disease, stage 3a: Secondary | ICD-10-CM

## 2024-06-01 DIAGNOSIS — Z8673 Personal history of transient ischemic attack (TIA), and cerebral infarction without residual deficits: Secondary | ICD-10-CM | POA: Diagnosis not present

## 2024-06-01 DIAGNOSIS — F0390 Unspecified dementia without behavioral disturbance: Secondary | ICD-10-CM | POA: Diagnosis not present

## 2024-06-01 DIAGNOSIS — M150 Primary generalized (osteo)arthritis: Secondary | ICD-10-CM

## 2024-06-01 DIAGNOSIS — E785 Hyperlipidemia, unspecified: Secondary | ICD-10-CM

## 2024-06-01 NOTE — Assessment & Plan Note (Signed)
 Better takes Trazodone  since 02/17/23 by psych

## 2024-06-01 NOTE — Progress Notes (Unsigned)
 Location:  [facility] Room Number: [phone] Place of Service:  SNF (31) Provider:  Plummer Matich X, NP  Patient Care Team: Delaila Nand X, NP as PCP - General (Internal Medicine) Bernie Lamar PARAS, MD as PCP - Cardiology (Cardiology)  Extended Emergency Contact Information Primary Emergency Contact: Cheetham,[name] Address: [phone] [address]           [facility] [address]          [place], [place] [address] [place] Home Phone: [phone] Mobile Phone: [phone] Relation: Spouse Secondary Emergency Contact: Daloia,Andy Address: [address]          [place], [place] [address] [place] Home Phone: [phone] Mobile Phone: [phone] Relation: Son  Code Status:  DNR Goals of care: Advanced Directive information    04/18/2024    2:17 PM  Advanced Directives  Does Patient Have a Medical Advance Directive? Yes  Type of Advance Directive Out of facility DNR (pink MOST or yellow form)  Does patient want to make changes to medical advance directive? No - Patient declined  Pre-existing out of facility DNR order (yellow form or pink MOST form) Yellow form placed in chart (order not valid for inpatient use)     Chief Complaint  Patient presents with  . Medical Management of Chronic Issues    Routine visit     HPI:  Pt is a 89 y.o. male seen today for medical management of chronic diseases.   Urinary frequency, taking Tamsulosin , off Myrbetriq by urology 11/18/23             HOH L>R, HPOA-wife declined hearing aids.              Parkinson's disease, stopped parkinson's medication because of affected his cognition, had resting tremor in left arm/leg in the past             OP 12/01/22 dc'd Alendronate tscore -2.9 2017, -0.9 01/19/19, refused DEXA 11/02/22             OA shoulders pain, worsens at night, limited abduction ROM over shoulder level, hx of neck clicks with ROM, aches, c/o right sciatica pain, worsens in am,  chronic left knee pain,  Norco and Gabapentin for pain control.              Cognitive impairment, SNF [facility] for supportive care, 07/16/22 MMSE 18/30, taking Deonepezil sine 8/21/2 by Psych, intermittent confusions.              CVA, no focal weakness residual.              Afib, taking  Eliquis , TSH 1.78 02/08/24             Bradycardia, off Metoprolol , Diltiazem  2/2 to bradycardia. 08/24/23 38/60 sec with V tach, stopped Metoprolol  per cardiology             Bipolar disorder, followed by psychiatry, on Lithium , TSH 1.78 02/08/24, Lithium  level 0.5(0.6-1.2) 02/08/24, saw Psych 05/03/24             Insomnia, takes Trazodone  since 02/17/23 by psych             CHF, EF 45-50% 11/2021, taking Furosemide, trace edema BLE, 05/10/23 CXR-mild pulmonary venous congestion, no infiltrate, followed by cardiology.              Gait abnormality, uses w/c for mobility  Hyponatremia, Na 137 02/07/24             Hyperbilirubinemia, bilirubin 0.7 02/07/24            Polycythemia,  Hgb 11.5 02/07/24             CKD, Bun/creat 21/1.3 02/07/24             Hypogonadism, hx of  testosterone  inj, dc'd by urology 11/18/23             Hyperlipidemia, takes Pravastatin , LDL 96 05/04/24             Glaucoma, eye drops.    Constipation, added Miralax, Senokot S  Past Medical History:  Diagnosis Date  . AF (paroxysmal atrial fibrillation) (HCC) 02/16/2018  . Atrial fibrillation (HCC)   . Atrial fibrillation with RVR (HCC) 02/16/2018  . Bipolar 1 disorder (HCC)   . Bipolar disorder (HCC) 02/17/2018  . Dizziness 02/26/2020  . DJD (degenerative joint disease)   . Dyslipidemia 02/26/2020  . Gait instability 02/16/2018  . Hypertension   . Mitral regurgitation    mild   . Polycythemia 02/16/2018  . Renal insufficiency   . Stroke (HCC)   . Tremor 02/16/2018  . Tremors of nervous system 01/2018   Past Surgical History:  Procedure Laterality Date  . SHOULDER SURGERY      Allergies  Allergen Reactions  . Lipitor  [Atorvastatin] Other (See Comments)    Joint soreness    Outpatient Encounter Medications as of 06/01/2024  Medication Sig  . ascorbic acid  (VITAMIN C ) 500 MG tablet Take 500 mg by mouth daily.  . Calcium Carbonate-Vit D-Min (CALCIUM 600+D PLUS MINERALS) 600-400 MG-UNIT CHEW Chew 1 Piece by mouth in the morning and at bedtime.  . docusate sodium  (COLACE) 100 MG capsule Take 1 capsule (100 mg total) by mouth 2 (two) times daily.  . donepezil  (ARICEPT ) 5 MG tablet Take 5 mg by mouth at bedtime.  . ELIQUIS  5 MG TABS tablet TAKE 1 TABLET BY MOUTH TWICE DAILY.  . furosemide (LASIX) 20 MG tablet Take 20 mg by mouth daily.  SABRA gabapentin (NEURONTIN) 100 MG capsule Take 200 mg by mouth at bedtime.  . HYDROcodone -acetaminophen  (NORCO/VICODIN) 5-325 MG tablet Take 0.5 tablets by mouth in the morning and at bedtime.  . latanoprost  (XALATAN ) 0.005 % ophthalmic solution Place 1 drop into both eyes at bedtime.  . lithium  carbonate (LITHOBID ) 300 MG ER tablet Take 1 tablet (300 mg total) by mouth every evening.  . Multiple Vitamin (MULTIVITAMIN WITH MINERALS) TABS tablet Take 1 tablet by mouth daily.  . Polyethyl Glycol-Propyl Glycol (SYSTANE) 0.4-0.3 % SOLN Apply 1 drop to eye every 4 (four) hours as needed (dry eyes). And four times a day scheduled  . polyethylene glycol (MIRALAX / GLYCOLAX) 17 g packet Take 17 g by mouth daily.  . potassium chloride (MICRO-K) 10 MEQ CR capsule Take 10 mEq by mouth daily.  . pravastatin  (PRAVACHOL ) 20 MG tablet Take 20 mg by mouth at bedtime.  . senna-docusate (SENOKOT-S) 8.6-50 MG tablet Take 1 tablet by mouth at bedtime.  . sodium chloride  (MURO 128) 5 % ophthalmic solution Place 1 drop into both eyes at bedtime.  . tamsulosin  (FLOMAX ) 0.4 MG CAPS capsule Take 1 capsule (0.4 mg total) by mouth daily.  . timolol  (BETIMOL ) 0.5 % ophthalmic solution Place 1 drop into both eyes daily.  . traZODone  (DESYREL ) 50 MG tablet Take 1 tablet (50 mg total) by mouth at bedtime.  .  [  DISCONTINUED] mupirocin ointment (BACTROBAN) 2 % 1 Application daily. (Patient not taking: Reported on 06/01/2024)   No facility-administered encounter medications on file as of 06/01/2024.    Review of Systems  Constitutional:  Negative for appetite change, fatigue and fever.  HENT:  Positive for hearing loss. Negative for congestion and trouble swallowing.   Eyes:  Negative for visual disturbance.  Respiratory:  Positive for cough. Negative for chest tightness and wheezing.   Cardiovascular:  Positive for leg swelling.  Gastrointestinal:  Negative for abdominal pain and constipation.  Genitourinary:  Negative for dysuria and urgency.       Incontinent of urine.   Musculoskeletal:  Positive for arthralgias, back pain and gait problem.       Shoulders pain with above shoulder level abduction ROM, worsens at night. R sciatic pain, not new, worse in am when getting up, chronic left knee pain-worsened pain now Worsened left neck/shoulder pain /tingling, numbness arm, no decreased muscle strength.   Skin:  Negative for color change and wound.  Neurological:  Negative for speech difficulty, weakness and headaches.  Psychiatric/Behavioral:  Positive for confusion. Negative for sleep disturbance. The patient is not nervous/anxious.        Early am awake, chronic     Immunization History  Administered Date(s) Administered  . Hepatitis B, ADULT 11/26/1997, 12/27/1997, 05/08/1998  . INFLUENZA, HIGH DOSE SEASONAL PF 03/31/2023  . Influenza-Unspecified 04/15/2022, 04/04/2024  . Moderna Covid-19 Vaccine Bivalent Booster 11yrs & up 04/14/2023  . Moderna Sars-Covid-2 Vaccination 07/03/2019, 07/31/2019, 05/13/2020, 03/19/2021  . PPD Test 07/08/2022  . Pneumococcal Conjugate-13 04/26/2014  . Pneumococcal Polysaccharide-23 04/14/2005  . RSV,unspecified 08/19/2022  . Td 08/04/2022  . Tdap 05/18/2016  . Tetanus 08/04/2022  . Unspecified SARS-COV-2 Vaccination 04/24/2024  . Zoster  Recombinant(Shingrix) 07/29/2022, 10/28/2022   Pertinent  Health Maintenance Due  Topic Date Due  . Influenza Vaccine  Completed      07/08/2022    9:22 AM 07/10/2022   11:13 AM 08/03/2022    4:30 PM 08/25/2022   11:49 AM 09/24/2022    1:55 PM  Fall Risk  Falls in the past year?  1 1 1  0  Was there an injury with Fall?  1  1  0  0   Fall Risk Category Calculator  3 3 1  0  Fall Risk Category (Retired)  High      (RETIRED) Patient Fall Risk Level High fall risk  High fall risk      Patient at Risk for Falls Due to  History of fall(s);Impaired balance/gait;Impaired mobility History of fall(s);Impaired balance/gait;Impaired mobility No Fall Risks   Fall risk Follow up  Falls evaluation completed  Falls evaluation completed Falls evaluation completed      Data saved with a previous flowsheet row definition   Functional Status Survey:    Vitals:   06/01/24 1047  BP: 137/63  Pulse: 66  SpO2: 94%  Weight: 221 lb 3.2 oz (100.3 kg)  Height: 5' 6 (1.676 m)   Body mass index is 35.7 kg/m. Physical Exam Vitals and nursing note reviewed.  Constitutional:      Appearance: Normal appearance.  HENT:     Head: Normocephalic and atraumatic.     Nose: No congestion.     Mouth/Throat:     Mouth: Mucous membranes are moist.     Pharynx: No posterior oropharyngeal erythema.  Eyes:     Extraocular Movements: Extraocular movements intact.     Conjunctiva/sclera: Conjunctivae normal.  Pupils: Pupils are equal, round, and reactive to light.  Cardiovascular:     Rate and Rhythm: Normal rate. Rhythm irregular.     Heart sounds: No murmur heard. Pulmonary:     Effort: Pulmonary effort is normal.     Breath sounds: No rales.     Comments: Central congestion Abdominal:     General: Bowel sounds are normal.     Palpations: Abdomen is soft.     Tenderness: There is no abdominal tenderness.  Genitourinary:    Comments: External hemorrhoids 4pm, no active bleeding or s/s of  injury Musculoskeletal:        General: Tenderness present.     Cervical back: Normal range of motion and neck supple.     Right lower leg: Edema present.     Left lower leg: Edema present.     Comments: trace edema BLE.  Shoulders pain with above shoulder level abduction ROM, worsens at night, R sciatica pain worse in am/getting out of bed, chronic left knee pain  Skin:    General: Skin is warm and dry.     Comments:    Neurological:     General: No focal deficit present.     Mental Status: He is alert and oriented to person, place, and time. Mental status is at baseline.     Motor: No weakness.     Gait: Gait abnormal.  Psychiatric:        Mood and Affect: Mood normal.        Behavior: Behavior normal.        Thought Content: Thought content normal.     Labs reviewed: Recent Labs    08/17/23 0000 02/01/24 0000 02/07/24 0000  NA 138 138 137  K 4.7 4.5 4.9  CL 104 103 102  CO2 28* 27* 28*  BUN 17 16 21   CREATININE 1.2 1.3 1.3  CALCIUM 10.5 10.8* 11.1*   Recent Labs    08/10/23 0000 02/01/24 0000 02/07/24 0000  AST 19 23 20   ALT 11 12 14   ALKPHOS 75 101 95  ALBUMIN 3.8 3.8 3.9   Recent Labs    07/15/23 0000 08/10/23 0000 02/01/24 0000 02/07/24 0000  WBC 9.1 9.3 10.5 12.0  NEUTROABS 8,199.00 6,547.00 7,287.00 8,640.00  HGB 11.1* 10.5* 11.6* 11.5*  HCT 34* 32* 37* 35*  PLT 296 347 335  --    Lab Results  Component Value Date   TSH 1.78 02/08/2024   Lab Results  Component Value Date   HGBA1C  08/24/2009    5.7 (NOTE) The ADA recommends the following therapeutic goal for glycemic control related to Hgb A1c measurement: Goal of therapy: <6.5 Hgb A1c  Reference: American Diabetes Association: Clinical Practice Recommendations 2010, Diabetes Care, 2010, 33: (Suppl  1).   Lab Results  Component Value Date   CHOL 162 05/04/2024   HDL 45 05/04/2024   LDLCALC 96 05/04/2024   LDLDIRECT 55 09/02/2020   TRIG 116 05/04/2024   CHOLHDL 3.3 09/02/2020     Significant Diagnostic Results in last 30 days:  No results found.  Assessment/Plan Major neurocognitive disorder [facility]) SNF [facility] for supportive care, 07/16/22 MMSE 18/30, taking Deonepezil sine 8/21/2 by Psych, intermittent confusions.   History of stroke no focal weakness residual.   Atrial fibrillation, persistent (HCC) Heart rate is in control taking  Eliquis , TSH 1.78 02/08/24  Bradycardia Heart rate normalized off Metoprolol , Diltiazem  2/2 to bradycardia. 08/24/23 38/60 sec with V tach, stopped Metoprolol  per cardiology  Bipolar  disorder [facility]) followed by psychiatry, on Lithium , TSH 1.78 02/08/24, Lithium  level 0.5(0.6-1.2) 02/08/24, saw Psych, update lithium  level  Insomnia Better takes Trazodone  since 02/17/23 by psych  Chronic CHF (HCC) Compensated clinically  EF 45-50% 11/2021, taking Furosemide, trace edema BLE, 05/10/23 CXR-mild pulmonary venous congestion, no infiltrate, followed by cardiology.   CKD (chronic kidney disease) stage 3, GFR 30-59 ml/min (HCC) Bun/creat 21/1.3 02/07/24  Dyslipidemia takes Pravastatin , LDL 96 05/04/24  Osteoarthritis, multiple sites shoulders pain, worsens at night, limited abduction ROM over shoulder level, hx of neck clicks with ROM, aches, c/o right sciatica pain, worsens in am, chronic left knee pain,  Norco and Gabapentin for pain control.   Urinary frequency taking Tamsulosin , off Myrbetriq by urology 11/18/23 No change.   Slow transit constipation Added MiraLax daily, Senokot S I at bedtime.      Family/ staff Communication: plan of care reviewed with the patient and charge nurse.   Labs/tests ordered: Lithium  level 06/06/2024

## 2024-06-01 NOTE — Assessment & Plan Note (Signed)
 followed by psychiatry, on Lithium , TSH 1.78 02/08/24, Lithium  level 0.5(0.6-1.2) 02/08/24, saw Psych, update lithium  level

## 2024-06-01 NOTE — Assessment & Plan Note (Signed)
 Bun/creat 21/1.3 02/07/24

## 2024-06-01 NOTE — Assessment & Plan Note (Signed)
 shoulders pain, worsens at night, limited abduction ROM over shoulder level, hx of neck clicks with ROM, aches, c/o right sciatica pain, worsens in am, chronic left knee pain,  Norco and Gabapentin for pain control.

## 2024-06-01 NOTE — Assessment & Plan Note (Signed)
 no focal weakness residual.

## 2024-06-01 NOTE — Assessment & Plan Note (Signed)
 Heart rate normalized off Metoprolol , Diltiazem  2/2 to bradycardia. 08/24/23 38/60 sec with V tach, stopped Metoprolol  per cardiology

## 2024-06-01 NOTE — Assessment & Plan Note (Signed)
 Heart rate is in control taking  Eliquis , TSH 1.78 02/08/24

## 2024-06-01 NOTE — Assessment & Plan Note (Signed)
 taking Tamsulosin , off Myrbetriq by urology 11/18/23 No change.

## 2024-06-01 NOTE — Assessment & Plan Note (Signed)
 SNF [facility] for supportive care, 07/16/22 MMSE 18/30, taking Deonepezil sine 8/21/2 by Psych, intermittent confusions.

## 2024-06-01 NOTE — Assessment & Plan Note (Signed)
 Compensated clinically  EF 45-50% 11/2021, taking Furosemide, trace edema BLE, 05/10/23 CXR-mild pulmonary venous congestion, no infiltrate, followed by cardiology.

## 2024-06-01 NOTE — Assessment & Plan Note (Signed)
 takes Pravastatin , LDL 96 05/04/24

## 2024-06-02 ENCOUNTER — Encounter: Payer: Self-pay | Admitting: Nurse Practitioner

## 2024-06-05 ENCOUNTER — Other Ambulatory Visit: Payer: Self-pay | Admitting: Nurse Practitioner

## 2024-06-05 DIAGNOSIS — M150 Primary generalized (osteo)arthritis: Secondary | ICD-10-CM

## 2024-06-05 DIAGNOSIS — K5901 Slow transit constipation: Secondary | ICD-10-CM | POA: Insufficient documentation

## 2024-06-05 MED ORDER — HYDROCODONE-ACETAMINOPHEN 5-325 MG PO TABS: 0.5000 | ORAL_TABLET | Freq: Two times a day (BID) | ORAL | 0 refills | Status: DC

## 2024-06-05 NOTE — Addendum Note (Signed)
 Addended by: Nigil Braman X on: 06/05/2024 11:17 AM   Modules accepted: Orders

## 2024-06-05 NOTE — Assessment & Plan Note (Signed)
 Added MiraLax daily, Senokot S I at bedtime.

## 2024-06-06 DIAGNOSIS — F319 Bipolar disorder, unspecified: Secondary | ICD-10-CM | POA: Diagnosis not present

## 2024-06-07 NOTE — Telephone Encounter (Signed)
 SABRA

## 2024-07-05 ENCOUNTER — Other Ambulatory Visit: Payer: Self-pay | Admitting: Adult Health

## 2024-07-05 DIAGNOSIS — M150 Primary generalized (osteo)arthritis: Secondary | ICD-10-CM

## 2024-07-05 MED ORDER — HYDROCODONE-ACETAMINOPHEN 5-325 MG PO TABS: 0.5000 | ORAL_TABLET | Freq: Two times a day (BID) | ORAL | 0 refills | Status: DC

## 2024-07-12 ENCOUNTER — Non-Acute Institutional Stay (SKILLED_NURSING_FACILITY): Admitting: Family Medicine

## 2024-07-12 DIAGNOSIS — I4819 Other persistent atrial fibrillation: Secondary | ICD-10-CM | POA: Diagnosis not present

## 2024-07-12 DIAGNOSIS — F0390 Unspecified dementia without behavioral disturbance: Secondary | ICD-10-CM | POA: Diagnosis not present

## 2024-07-12 DIAGNOSIS — I509 Heart failure, unspecified: Secondary | ICD-10-CM | POA: Diagnosis not present

## 2024-07-12 DIAGNOSIS — F5101 Primary insomnia: Secondary | ICD-10-CM

## 2024-07-12 DIAGNOSIS — I10 Essential (primary) hypertension: Secondary | ICD-10-CM | POA: Diagnosis not present

## 2024-07-12 NOTE — Progress Notes (Signed)
 " Provider:  Garnette Pinal, MD Location:      Place of Service:     PCP: Mast, Man X, NP Patient Care Team: Mast, Man X, NP as PCP - General (Internal Medicine) Bernie Lamar PARAS, MD as PCP - Cardiology (Cardiology)  Extended Emergency Contact Information Primary Emergency Contact: Dollens,[name] Address: [phone]           [facility] [address]          [place], [place] [address] [place] Home Phone: [phone] Mobile Phone: [phone] Relation: Spouse Secondary Emergency Contact: Tapanes,Andy Address: [address]          [place], [place] [address] [place] Home Phone: [phone] Mobile Phone: [phone] Relation: Son  Code Status:  Goals of Care: Advanced Directive information    04/18/2024    2:17 PM  Advanced Directives  Does Patient Have a Medical Advance Directive? Yes  Type of Advance Directive Out of facility DNR (pink MOST or yellow form)  Does patient want to make changes to medical advance directive? No - Patient declined  Pre-existing out of facility DNR order (yellow form or pink MOST form) Yellow form placed in chart (order not valid for inpatient use)     HPI: Patient is a 89 y.o. male seen today for medical management of chronic problems including: Major neurocognitive disorder, bipolar disorder, gait instability, hypertension, and atrial fibrillation.  Patient seems to have adjusted well to current environment.  I last saw him over 1 year ago and he at that time seemed very disappointed that [facility] when asked with his wife.  He reports today that she visits several times a week and he is okay with that although he says 4 times a week might be better for him. I encountered him napping in the day room after having just eaten breakfast.  He has no specific complaints today reports to good appetite and sleeping well.  He also tells me that he is unable to walk and gets  around using wheelchair and has no problem remembering that it is necessary for his locomotion. Last MMSE he was 18/30.  He continues to take donepezil . For A-fib he is on Eliquis  but off any rate controlling medications as he was bradycardic on beta-blocker and calcium channel blocker. He is followed by psychiatry for bipolar disease and takes lithium  which is monitored regularly  Past Medical History:  Diagnosis Date   AF (paroxysmal atrial fibrillation) (HCC) 02/16/2018   Atrial fibrillation (HCC)    Atrial fibrillation with RVR (HCC) 02/16/2018   Bipolar 1 disorder (HCC)    Bipolar disorder (HCC) 02/17/2018   Dizziness 02/26/2020   DJD (degenerative joint disease)    Dyslipidemia 02/26/2020   Gait instability 02/16/2018   Hypertension    Mitral regurgitation    mild    Polycythemia 02/16/2018   Renal insufficiency    Stroke [facility])    Tremor 02/16/2018   Tremors of nervous system 01/2018   Past Surgical History:  Procedure Laterality Date   SHOULDER SURGERY      reports that he has quit smoking. He has never used smokeless tobacco. He reports current alcohol use of about 1.0 standard drink of alcohol per week. He reports that he does not use drugs. Social History   Socioeconomic History   Marital status: Married    Spouse name: Not on file   Number of children: Not on file  Years of education: Not on file   Highest education level: Not on file  Occupational History   Not on file  Tobacco Use   Smoking status: Former   Smokeless tobacco: Never  Vaping Use   Vaping status: Never Used  Substance and Sexual Activity   Alcohol use: Yes    Alcohol/week: 1.0 standard drink of alcohol    Types: 1 Glasses of wine per week    Comment: 1 per month   Drug use: No   Sexual activity: Not on file  Other Topics Concern   Not on file  Social History Narrative   Not on file   Social Drivers of Health   Tobacco Use: Medium Risk (06/02/2024)   Patient History    Smoking Tobacco  Use: Former    Smokeless Tobacco Use: Never    Passive Exposure: Not on Actuary Strain: Not on file  Food Insecurity: No Food Insecurity (07/02/2022)   Hunger Vital Sign    Worried About Running Out of Food in the Last Year: Never true    Ran Out of Food in the Last Year: Never true  Transportation Needs: No Transportation Needs (07/02/2022)   PRAPARE - Administrator, Civil Service (Medical): No    Lack of Transportation (Non-Medical): No  Physical Activity: Not on file  Stress: Not on file  Social Connections: Not on file  Intimate Partner Violence: Not At Risk (07/02/2022)   Humiliation, Afraid, Rape, and Kick questionnaire    Fear of Current or Ex-Partner: No    Emotionally Abused: No    Physically Abused: No    Sexually Abused: No  Depression (PHQ2-9): Low Risk (07/27/2023)   Depression (PHQ2-9)    PHQ-2 Score: 0  Alcohol Screen: Not on file  Housing: Low Risk (07/02/2022)   Housing    Last Housing Risk Score: 0  Utilities: Not At Risk (07/02/2022)   AHC Utilities    Threatened with loss of utilities: No  Health Literacy: Not on file    Functional Status Survey:    Family History  Problem Relation Age of Onset   Alcohol abuse Mother    Cancer Father     Health Maintenance  Topic Date Due   Medicare Annual Wellness (AWV)  07/26/2024   COVID-19 Vaccine (7 - Moderna risk 2025-26 season) 10/23/2024   DTaP/Tdap/Td (4 - Td or Tdap) 08/04/2032   Pneumococcal Vaccine: 50+ Years  Completed   Influenza Vaccine  Completed   Zoster Vaccines- Shingrix  Completed   Meningococcal B Vaccine  Aged Out   Hepatitis B Vaccines 19-59 Average Risk  Discontinued    Allergies[1]  Outpatient Encounter Medications as of 07/12/2024  Medication Sig   ascorbic acid  (VITAMIN C ) 500 MG tablet Take 500 mg by mouth daily.   Calcium Carbonate-Vit D-Min (CALCIUM 600+D PLUS MINERALS) 600-400 MG-UNIT CHEW Chew 1 Piece by mouth in the morning and at bedtime.   docusate  sodium (COLACE) 100 MG capsule Take 1 capsule (100 mg total) by mouth 2 (two) times daily.   donepezil  (ARICEPT ) 5 MG tablet Take 5 mg by mouth at bedtime.   ELIQUIS  5 MG TABS tablet TAKE 1 TABLET BY MOUTH TWICE DAILY.   furosemide (LASIX) 20 MG tablet Take 20 mg by mouth daily.   gabapentin (NEURONTIN) 100 MG capsule Take 200 mg by mouth at bedtime.   HYDROcodone -acetaminophen  (NORCO/VICODIN) 5-325 MG tablet Take 0.5 tablets by mouth in the morning and at bedtime.  latanoprost  (XALATAN ) 0.005 % ophthalmic solution Place 1 drop into both eyes at bedtime.   lithium  carbonate (LITHOBID ) 300 MG ER tablet Take 1 tablet (300 mg total) by mouth every evening.   Multiple Vitamin (MULTIVITAMIN WITH MINERALS) TABS tablet Take 1 tablet by mouth daily.   Polyethyl Glycol-Propyl Glycol (SYSTANE) 0.4-0.3 % SOLN Apply 1 drop to eye every 4 (four) hours as needed (dry eyes). And four times a day scheduled   polyethylene glycol (MIRALAX / GLYCOLAX) 17 g packet Take 17 g by mouth daily.   potassium chloride (MICRO-K) 10 MEQ CR capsule Take 10 mEq by mouth daily.   pravastatin  (PRAVACHOL ) 20 MG tablet Take 20 mg by mouth at bedtime.   senna-docusate (SENOKOT-S) 8.6-50 MG tablet Take 1 tablet by mouth at bedtime.   sodium chloride  (MURO 128) 5 % ophthalmic solution Place 1 drop into both eyes at bedtime.   tamsulosin  (FLOMAX ) 0.4 MG CAPS capsule Take 1 capsule (0.4 mg total) by mouth daily.   timolol  (BETIMOL ) 0.5 % ophthalmic solution Place 1 drop into both eyes daily.   traZODone  (DESYREL ) 50 MG tablet Take 1 tablet (50 mg total) by mouth at bedtime.   No facility-administered encounter medications on file as of 07/12/2024.    Review of Systems  Constitutional: Negative.   HENT: Negative.    Respiratory: Negative.    Cardiovascular:  Positive for leg swelling.  Gastrointestinal: Negative.   Genitourinary: Negative.   Musculoskeletal:  Positive for arthralgias and gait problem.  Neurological:  Positive  for weakness.  Psychiatric/Behavioral:  Positive for confusion.   All other systems reviewed and are negative.   There were no vitals filed for this visit. There is no height or weight on file to calculate BMI. Physical Exam Constitutional:      Appearance: He is obese.  Eyes:     Pupils: Pupils are equal, round, and reactive to light.  Cardiovascular:     Rate and Rhythm: Normal rate. Rhythm irregular.  Pulmonary:     Effort: Pulmonary effort is normal.     Breath sounds: Normal breath sounds.  Abdominal:     General: Bowel sounds are normal.     Palpations: Abdomen is soft.  Musculoskeletal:     Comments: Uses wheelchair for ambulation  Neurological:     General: No focal deficit present.     Mental Status: He is alert and oriented to person, place, and time.  Psychiatric:        Mood and Affect: Mood normal.        Behavior: Behavior normal.     Labs reviewed: Basic Metabolic Panel: Recent Labs    08/17/23 0000 02/01/24 0000 02/07/24 0000  NA 138 138 137  K 4.7 4.5 4.9  CL 104 103 102  CO2 28* 27* 28*  BUN 17 16 21   CREATININE 1.2 1.3 1.3  CALCIUM 10.5 10.8* 11.1*   Liver Function Tests: Recent Labs    08/10/23 0000 02/01/24 0000 02/07/24 0000  AST 19 23 20   ALT 11 12 14   ALKPHOS 75 101 95  ALBUMIN 3.8 3.8 3.9   No results for input(s): LIPASE, AMYLASE in the last 8760 hours. No results for input(s): AMMONIA in the last 8760 hours. CBC: Recent Labs    07/15/23 0000 08/10/23 0000 02/01/24 0000 02/07/24 0000  WBC 9.1 9.3 10.5 12.0  NEUTROABS 8,199.00 6,547.00 7,287.00 8,640.00  HGB 11.1* 10.5* 11.6* 11.5*  HCT 34* 32* 37* 35*  PLT 296 347 335  --  Cardiac Enzymes: No results for input(s): CKTOTAL, CKMB, CKMBINDEX, TROPONINI in the last 8760 hours. BNP: Invalid input(s): POCBNP Lab Results  Component Value Date   HGBA1C  08/24/2009    5.7 (NOTE) The ADA recommends the following therapeutic goal for glycemic control  related to Hgb A1c measurement: Goal of therapy: <6.5 Hgb A1c  Reference: American Diabetes Association: Clinical Practice Recommendations 2010, Diabetes Care, 2010, 33: (Suppl  1).   Lab Results  Component Value Date   TSH 1.78 02/08/2024   Lab Results  Component Value Date   VITAMINB12 278 07/02/2022   No results found for: FOLATE No results found for: IRON, TIBC, FERRITIN  Imaging and Procedures obtained prior to SNF admission: CT Head Wo Contrast Result Date: 07/01/2022 CLINICAL DATA:  Head and neck trauma. EXAM: CT HEAD WITHOUT CONTRAST CT CERVICAL SPINE WITHOUT CONTRAST TECHNIQUE: Multidetector CT imaging of the head and cervical spine was performed following the standard protocol without intravenous contrast. Multiplanar CT image reconstructions of the cervical spine were also generated. RADIATION DOSE REDUCTION: This exam was performed according to the departmental dose-optimization program which includes automated exposure control, adjustment of the mA and/or kV according to patient size and/or use of iterative reconstruction technique. COMPARISON:  CT examination dated February 16, 2018 FINDINGS: CT HEAD FINDINGS Brain: No evidence of acute infarction, hemorrhage, extra-axial collection or mass lesion/mass effect. Prominence of the ventricles and sulci secondary to moderate cerebral volume loss. Diffuse low-attenuation of the periventricular white matter presumed advanced chronic microvascular ischemic changes of the white matter. Prominence of the ventricles disproportionate to the volume loss concerning for normal pressure hydrocephalus. Vascular: No hyperdense vessel or unexpected calcification. Skull: Normal. Negative for fracture or focal lesion. Sinuses/Orbits: No acute finding. Other: None. CT CERVICAL SPINE FINDINGS Alignment: Straightening of the cervical spine. Skull base and vertebrae: No acute fracture. No primary bone lesion or focal pathologic process. Soft tissues and  spinal canal: No prevertebral fluid or swelling. No visible canal hematoma. Disc levels: Multilevel degenerate disc disease with disc height loss and marginal osteophytes with associated uncovertebral joint and facet joint arthropathy. C2-C3:  No significant findings. C3-C4: Disc height loss and uncovertebral joint arthropathy with mild right and moderate left neural foraminal stenosis. Mild left facet joint arthropathy. C4-C5: Disc height loss and marked uncovertebral joint arthropathy with moderate-to-severe bilateral neural foraminal stenosis. C5-C6: Disc height loss and uncovertebral joint arthropathy with moderate right and severe left neural foraminal stenosis. C6-C7: Disc height loss and uncovertebral joint arthropathy with mild left neural foraminal stenosis. C7-T1: Disc height loss and facet joint arthropathy. No significant neural foraminal stenosis. Upper chest: Negative. Other: None IMPRESSION: CT HEAD: 1. No acute intracranial abnormality. 2. Moderate cerebral volume loss and advanced chronic microvascular ischemic changes of the white matter. 3. Prominence of the ventricles more than expected for the cerebral volume loss concerning for normal pressure hydrocephalus. CT CERVICAL SPINE: 1. No acute fracture or traumatic subluxation. 2. Multilevel degenerate disc disease with associated uncovertebral joint and facet joint arthropathy. Electronically Signed   By: Imran  Ahmed D.O.   On: 07/01/2022 23:25   CT Cervical Spine Wo Contrast Result Date: 07/01/2022 CLINICAL DATA:  Head and neck trauma. EXAM: CT HEAD WITHOUT CONTRAST CT CERVICAL SPINE WITHOUT CONTRAST TECHNIQUE: Multidetector CT imaging of the head and cervical spine was performed following the standard protocol without intravenous contrast. Multiplanar CT image reconstructions of the cervical spine were also generated. RADIATION DOSE REDUCTION: This exam was performed according to the departmental dose-optimization program which  includes  automated exposure control, adjustment of the mA and/or kV according to patient size and/or use of iterative reconstruction technique. COMPARISON:  CT examination dated February 16, 2018 FINDINGS: CT HEAD FINDINGS Brain: No evidence of acute infarction, hemorrhage, extra-axial collection or mass lesion/mass effect. Prominence of the ventricles and sulci secondary to moderate cerebral volume loss. Diffuse low-attenuation of the periventricular white matter presumed advanced chronic microvascular ischemic changes of the white matter. Prominence of the ventricles disproportionate to the volume loss concerning for normal pressure hydrocephalus. Vascular: No hyperdense vessel or unexpected calcification. Skull: Normal. Negative for fracture or focal lesion. Sinuses/Orbits: No acute finding. Other: None. CT CERVICAL SPINE FINDINGS Alignment: Straightening of the cervical spine. Skull base and vertebrae: No acute fracture. No primary bone lesion or focal pathologic process. Soft tissues and spinal canal: No prevertebral fluid or swelling. No visible canal hematoma. Disc levels: Multilevel degenerate disc disease with disc height loss and marginal osteophytes with associated uncovertebral joint and facet joint arthropathy. C2-C3:  No significant findings. C3-C4: Disc height loss and uncovertebral joint arthropathy with mild right and moderate left neural foraminal stenosis. Mild left facet joint arthropathy. C4-C5: Disc height loss and marked uncovertebral joint arthropathy with moderate-to-severe bilateral neural foraminal stenosis. C5-C6: Disc height loss and uncovertebral joint arthropathy with moderate right and severe left neural foraminal stenosis. C6-C7: Disc height loss and uncovertebral joint arthropathy with mild left neural foraminal stenosis. C7-T1: Disc height loss and facet joint arthropathy. No significant neural foraminal stenosis. Upper chest: Negative. Other: None IMPRESSION: CT HEAD: 1. No acute  intracranial abnormality. 2. Moderate cerebral volume loss and advanced chronic microvascular ischemic changes of the white matter. 3. Prominence of the ventricles more than expected for the cerebral volume loss concerning for normal pressure hydrocephalus. CT CERVICAL SPINE: 1. No acute fracture or traumatic subluxation. 2. Multilevel degenerate disc disease with associated uncovertebral joint and facet joint arthropathy. Electronically Signed   By: Imran  Ahmed D.O.   On: 07/01/2022 23:25   DG Chest 1 View Result Date: 07/01/2022 CLINICAL DATA:  Altered mental status. EXAM: CHEST  1 VIEW COMPARISON:  Chest x-ray 09/06/2013 FINDINGS: The heart and mediastinal contours are unchanged. Aortic calcification. No focal consolidation. No pulmonary edema. No pleural effusion. No pneumothorax. No acute osseous abnormality. Severe degenerative changes of the bilateral shoulders. IMPRESSION: No active disease. Electronically Signed   By: Morgane  Naveau M.D.   On: 07/01/2022 22:44    Assessment/Plan Assessment & Plan Primary hypertension No meds except diuretic pressure today is 124/62 Chronic congestive heart failure, unspecified heart failure type (HCC) Followed by cardiology.  Continues with furosemide Atrial fibrillation, persistent (HCC) Only med pertinent to A-fib is Eliquis  Major neurocognitive disorder (HCC) Well-adjusted.  Continue with donepezil  Primary insomnia Tolerates trazodone  well with no complaints  Family/ staff Communication:   Labs/tests ordered:  .smmsig     [1]  Allergies Allergen Reactions   Lipitor [Atorvastatin] Other (See Comments)    Joint soreness   "

## 2024-07-12 NOTE — Assessment & Plan Note (Addendum)
 Tolerates trazodone  well with no complaints

## 2024-07-12 NOTE — Assessment & Plan Note (Addendum)
 Followed by cardiology.  Continues with furosemide

## 2024-07-12 NOTE — Assessment & Plan Note (Addendum)
 Only med pertinent to A-fib is Eliquis 

## 2024-07-12 NOTE — Assessment & Plan Note (Addendum)
 Well-adjusted.  Continue with donepezil 

## 2024-07-12 NOTE — Assessment & Plan Note (Addendum)
 No meds except diuretic pressure today is 124/62

## 2024-07-28 ENCOUNTER — Encounter: Payer: Self-pay | Admitting: Nurse Practitioner

## 2024-07-28 ENCOUNTER — Non-Acute Institutional Stay (SKILLED_NURSING_FACILITY): Payer: Self-pay | Admitting: Nurse Practitioner

## 2024-07-28 DIAGNOSIS — L089 Local infection of the skin and subcutaneous tissue, unspecified: Secondary | ICD-10-CM | POA: Insufficient documentation

## 2024-07-28 DIAGNOSIS — K5901 Slow transit constipation: Secondary | ICD-10-CM | POA: Diagnosis not present

## 2024-07-28 DIAGNOSIS — T148XXA Other injury of unspecified body region, initial encounter: Secondary | ICD-10-CM

## 2024-07-28 DIAGNOSIS — F0390 Unspecified dementia without behavioral disturbance: Secondary | ICD-10-CM | POA: Diagnosis not present

## 2024-07-28 DIAGNOSIS — R251 Tremor, unspecified: Secondary | ICD-10-CM

## 2024-07-28 DIAGNOSIS — I509 Heart failure, unspecified: Secondary | ICD-10-CM | POA: Diagnosis not present

## 2024-07-28 DIAGNOSIS — R350 Frequency of micturition: Secondary | ICD-10-CM

## 2024-07-28 DIAGNOSIS — R001 Bradycardia, unspecified: Secondary | ICD-10-CM | POA: Diagnosis not present

## 2024-07-28 DIAGNOSIS — I4819 Other persistent atrial fibrillation: Secondary | ICD-10-CM

## 2024-07-28 DIAGNOSIS — F319 Bipolar disorder, unspecified: Secondary | ICD-10-CM

## 2024-07-28 DIAGNOSIS — M150 Primary generalized (osteo)arthritis: Secondary | ICD-10-CM | POA: Diagnosis not present

## 2024-07-28 NOTE — Assessment & Plan Note (Signed)
"   stable, taking  Miralax, Senokot S  "

## 2024-07-28 NOTE — Assessment & Plan Note (Signed)
"   Cognitive impairment, SNF [facility] for supportive care, 07/16/22 MMSE 18/30, taking Deonepezil sine 8/21/2 by Psych, intermittent confusions.  "

## 2024-07-28 NOTE — Assessment & Plan Note (Signed)
"   EF 45-50% 11/2021, taking Furosemide, more edema BLE, weight gained about #10Ibs in the past month, 05/10/23 CXR-mild pulmonary venous congestion, no infiltrate, followed by cardiology Increase Furosemide 20mg /Kcl meq bid/daily CMP/eGFR, BNP one week.  "

## 2024-07-28 NOTE — Assessment & Plan Note (Signed)
 Parkinson's disease, stopped parkinson's medication because of affected his cognition, had resting tremor in left arm/leg in the past

## 2024-07-28 NOTE — Assessment & Plan Note (Signed)
 Heart rate is normalized off Metoprolol , Diltiazem  2/2 to bradycardia. 08/24/23 38/60 sec with V tach, stopped Metoprolol  per cardiology

## 2024-07-28 NOTE — Assessment & Plan Note (Signed)
 Urinary frequency, taking Tamsulosin , off Myrbetriq by urology 11/18/23

## 2024-07-28 NOTE — Assessment & Plan Note (Signed)
 His mood is a stable  followed by psychiatry, on Lithium , TSH 1.78 02/08/24, Lithium  level 0.5(0.6-1.2) 02/08/24, saw Psych 05/03/24 takes Trazodone  since 02/17/23 by psych

## 2024-07-28 NOTE — Assessment & Plan Note (Signed)
 Neck, shoulders, R sciatica, left knee pain Pain is managed with Gabapentin, Norco

## 2024-07-28 NOTE — Assessment & Plan Note (Signed)
 Left cheek, open area, tenderness, warmth, and redness noted, apply Bactroban oint daily until healed.  Denied eye pain.

## 2024-08-02 NOTE — Telephone Encounter (Signed)
 Mast, Man X, NP to Me  (Selected Message)     08/02/24 11:37 AM Checked on the patient's wounds on his face and left lower leg, no s/s of infection. Charge nurse from [facility] will update the HPOA-son.  Thank you

## 2024-08-03 ENCOUNTER — Other Ambulatory Visit: Payer: Self-pay | Admitting: Nurse Practitioner

## 2024-08-03 DIAGNOSIS — M150 Primary generalized (osteo)arthritis: Secondary | ICD-10-CM

## 2024-08-03 MED ORDER — HYDROCODONE-ACETAMINOPHEN 5-325 MG PO TABS: 0.5000 | ORAL_TABLET | Freq: Two times a day (BID) | ORAL | 0 refills | Status: AC

## 2024-11-01 ENCOUNTER — Ambulatory Visit: Admitting: Psychiatry
